# Patient Record
Sex: Female | Born: 1975
Health system: Southern US, Community
[De-identification: ages and names within clinical notes are randomized; demographics above are authoritative.]

## PROBLEM LIST (undated history)

## (undated) DIAGNOSIS — F1910 Other psychoactive substance abuse, uncomplicated: Secondary | ICD-10-CM

## (undated) DIAGNOSIS — E119 Type 2 diabetes mellitus without complications: Secondary | ICD-10-CM

## (undated) HISTORY — PX: TUBAL LIGATION: SHX77

---

## 1997-11-01 ENCOUNTER — Emergency Department (HOSPITAL_COMMUNITY): Admission: EM | Admit: 1997-11-01 | Discharge: 1997-11-01 | Payer: Self-pay | Admitting: Emergency Medicine

## 1997-12-16 ENCOUNTER — Emergency Department (HOSPITAL_COMMUNITY): Admission: EM | Admit: 1997-12-16 | Discharge: 1997-12-16 | Payer: Self-pay | Admitting: Emergency Medicine

## 1998-05-30 ENCOUNTER — Ambulatory Visit (HOSPITAL_COMMUNITY): Admission: RE | Admit: 1998-05-30 | Discharge: 1998-05-30 | Payer: Self-pay | Admitting: *Deleted

## 1998-07-04 ENCOUNTER — Inpatient Hospital Stay (HOSPITAL_COMMUNITY): Admission: AD | Admit: 1998-07-04 | Discharge: 1998-07-08 | Payer: Self-pay | Admitting: *Deleted

## 1998-08-08 ENCOUNTER — Encounter: Admission: RE | Admit: 1998-08-08 | Discharge: 1998-08-08 | Payer: Self-pay | Admitting: Obstetrics

## 1998-08-08 ENCOUNTER — Other Ambulatory Visit: Admission: RE | Admit: 1998-08-08 | Discharge: 1998-08-08 | Payer: Self-pay | Admitting: Obstetrics

## 1999-06-25 ENCOUNTER — Inpatient Hospital Stay (HOSPITAL_COMMUNITY): Admission: AD | Admit: 1999-06-25 | Discharge: 1999-06-25 | Payer: Self-pay | Admitting: *Deleted

## 1999-08-14 ENCOUNTER — Ambulatory Visit (HOSPITAL_COMMUNITY): Admission: RE | Admit: 1999-08-14 | Discharge: 1999-08-14 | Payer: Self-pay | Admitting: *Deleted

## 1999-09-04 ENCOUNTER — Encounter: Admission: RE | Admit: 1999-09-04 | Discharge: 1999-09-04 | Payer: Self-pay | Admitting: Obstetrics

## 1999-09-08 ENCOUNTER — Ambulatory Visit (HOSPITAL_COMMUNITY): Admission: RE | Admit: 1999-09-08 | Discharge: 1999-09-08 | Payer: Self-pay | Admitting: Obstetrics

## 1999-10-16 ENCOUNTER — Encounter: Admission: RE | Admit: 1999-10-16 | Discharge: 1999-10-16 | Payer: Self-pay | Admitting: Obstetrics

## 1999-10-16 ENCOUNTER — Inpatient Hospital Stay (HOSPITAL_COMMUNITY): Admission: AD | Admit: 1999-10-16 | Discharge: 1999-10-16 | Payer: Self-pay | Admitting: Obstetrics & Gynecology

## 1999-10-16 ENCOUNTER — Encounter (HOSPITAL_COMMUNITY): Admission: RE | Admit: 1999-10-16 | Discharge: 1999-11-03 | Payer: Self-pay | Admitting: Obstetrics

## 1999-10-16 ENCOUNTER — Encounter: Payer: Self-pay | Admitting: Obstetrics & Gynecology

## 1999-10-17 ENCOUNTER — Encounter: Admission: RE | Admit: 1999-10-17 | Discharge: 2000-01-15 | Payer: Self-pay | Admitting: Obstetrics & Gynecology

## 1999-10-30 ENCOUNTER — Inpatient Hospital Stay (HOSPITAL_COMMUNITY): Admission: AD | Admit: 1999-10-30 | Discharge: 1999-11-03 | Payer: Self-pay | Admitting: Obstetrics & Gynecology

## 1999-10-30 ENCOUNTER — Encounter (INDEPENDENT_AMBULATORY_CARE_PROVIDER_SITE_OTHER): Payer: Self-pay | Admitting: Specialist

## 1999-10-30 ENCOUNTER — Encounter: Payer: Self-pay | Admitting: Obstetrics & Gynecology

## 1999-10-30 ENCOUNTER — Encounter: Admission: RE | Admit: 1999-10-30 | Discharge: 1999-10-30 | Payer: Self-pay | Admitting: Obstetrics

## 2002-09-26 ENCOUNTER — Emergency Department (HOSPITAL_COMMUNITY): Admission: EM | Admit: 2002-09-26 | Discharge: 2002-09-26 | Payer: Self-pay | Admitting: Emergency Medicine

## 2003-05-17 ENCOUNTER — Emergency Department (HOSPITAL_COMMUNITY): Admission: EM | Admit: 2003-05-17 | Discharge: 2003-05-17 | Payer: Self-pay | Admitting: Emergency Medicine

## 2004-02-29 ENCOUNTER — Emergency Department (HOSPITAL_COMMUNITY): Admission: EM | Admit: 2004-02-29 | Discharge: 2004-02-29 | Payer: Self-pay | Admitting: Emergency Medicine

## 2004-10-16 ENCOUNTER — Emergency Department (HOSPITAL_COMMUNITY): Admission: EM | Admit: 2004-10-16 | Discharge: 2004-10-16 | Payer: Self-pay | Admitting: Emergency Medicine

## 2005-11-22 ENCOUNTER — Emergency Department (HOSPITAL_COMMUNITY): Admission: EM | Admit: 2005-11-22 | Discharge: 2005-11-22 | Payer: Self-pay | Admitting: Emergency Medicine

## 2006-06-07 ENCOUNTER — Emergency Department (HOSPITAL_COMMUNITY): Admission: EM | Admit: 2006-06-07 | Discharge: 2006-06-07 | Payer: Self-pay | Admitting: Emergency Medicine

## 2006-11-11 ENCOUNTER — Emergency Department (HOSPITAL_COMMUNITY): Admission: EM | Admit: 2006-11-11 | Discharge: 2006-11-11 | Payer: Self-pay | Admitting: Family Medicine

## 2007-04-12 ENCOUNTER — Emergency Department (HOSPITAL_COMMUNITY): Admission: EM | Admit: 2007-04-12 | Discharge: 2007-04-12 | Payer: Self-pay | Admitting: Emergency Medicine

## 2007-05-09 ENCOUNTER — Emergency Department (HOSPITAL_COMMUNITY): Admission: EM | Admit: 2007-05-09 | Discharge: 2007-05-09 | Payer: Self-pay | Admitting: Family Medicine

## 2008-07-09 ENCOUNTER — Emergency Department (HOSPITAL_COMMUNITY): Admission: EM | Admit: 2008-07-09 | Discharge: 2008-07-09 | Payer: Self-pay | Admitting: Family Medicine

## 2008-10-03 ENCOUNTER — Emergency Department (HOSPITAL_COMMUNITY): Admission: EM | Admit: 2008-10-03 | Discharge: 2008-10-03 | Payer: Self-pay | Admitting: Family Medicine

## 2008-12-04 ENCOUNTER — Ambulatory Visit: Payer: Self-pay

## 2008-12-04 ENCOUNTER — Encounter (INDEPENDENT_AMBULATORY_CARE_PROVIDER_SITE_OTHER): Payer: Self-pay | Admitting: *Deleted

## 2008-12-04 DIAGNOSIS — L259 Unspecified contact dermatitis, unspecified cause: Secondary | ICD-10-CM

## 2009-02-17 ENCOUNTER — Emergency Department (HOSPITAL_COMMUNITY): Admission: EM | Admit: 2009-02-17 | Discharge: 2009-02-17 | Payer: Self-pay | Admitting: Emergency Medicine

## 2009-03-17 ENCOUNTER — Emergency Department (HOSPITAL_COMMUNITY): Admission: EM | Admit: 2009-03-17 | Discharge: 2009-03-17 | Payer: Self-pay | Admitting: Emergency Medicine

## 2009-07-14 ENCOUNTER — Emergency Department (HOSPITAL_COMMUNITY): Admission: EM | Admit: 2009-07-14 | Discharge: 2009-07-14 | Payer: Self-pay | Admitting: Emergency Medicine

## 2009-11-28 ENCOUNTER — Emergency Department (HOSPITAL_COMMUNITY): Admission: EM | Admit: 2009-11-28 | Discharge: 2009-11-28 | Payer: Self-pay | Admitting: Family Medicine

## 2009-12-13 ENCOUNTER — Emergency Department (HOSPITAL_COMMUNITY): Admission: EM | Admit: 2009-12-13 | Discharge: 2009-12-13 | Payer: Self-pay | Admitting: Emergency Medicine

## 2010-07-30 ENCOUNTER — Emergency Department (HOSPITAL_COMMUNITY)
Admission: EM | Admit: 2010-07-30 | Discharge: 2010-07-30 | Disposition: A | Discharge status: Home / Self Care | Payer: Self-pay | Attending: Emergency Medicine | Admitting: Emergency Medicine

## 2010-07-30 DIAGNOSIS — IMO0002 Reserved for concepts with insufficient information to code with codable children: Secondary | ICD-10-CM | POA: Insufficient documentation

## 2010-07-30 DIAGNOSIS — Y92009 Unspecified place in unspecified non-institutional (private) residence as the place of occurrence of the external cause: Secondary | ICD-10-CM | POA: Insufficient documentation

## 2010-07-30 DIAGNOSIS — M25579 Pain in unspecified ankle and joints of unspecified foot: Secondary | ICD-10-CM | POA: Insufficient documentation

## 2010-07-30 DIAGNOSIS — M79609 Pain in unspecified limb: Secondary | ICD-10-CM | POA: Insufficient documentation

## 2010-08-08 ENCOUNTER — Emergency Department (HOSPITAL_COMMUNITY)
Admission: EM | Admit: 2010-08-08 | Discharge: 2010-08-08 | Disposition: A | Discharge status: Home / Self Care | Payer: Self-pay | Attending: Emergency Medicine | Admitting: Emergency Medicine

## 2010-08-08 DIAGNOSIS — R509 Fever, unspecified: Secondary | ICD-10-CM | POA: Insufficient documentation

## 2010-08-08 DIAGNOSIS — R05 Cough: Secondary | ICD-10-CM | POA: Insufficient documentation

## 2010-08-08 DIAGNOSIS — J069 Acute upper respiratory infection, unspecified: Secondary | ICD-10-CM | POA: Insufficient documentation

## 2010-08-08 DIAGNOSIS — R062 Wheezing: Secondary | ICD-10-CM | POA: Insufficient documentation

## 2010-08-08 DIAGNOSIS — R0609 Other forms of dyspnea: Secondary | ICD-10-CM | POA: Insufficient documentation

## 2010-08-08 DIAGNOSIS — R059 Cough, unspecified: Secondary | ICD-10-CM | POA: Insufficient documentation

## 2010-08-08 DIAGNOSIS — J40 Bronchitis, not specified as acute or chronic: Secondary | ICD-10-CM | POA: Insufficient documentation

## 2010-08-08 DIAGNOSIS — R0989 Other specified symptoms and signs involving the circulatory and respiratory systems: Secondary | ICD-10-CM | POA: Insufficient documentation

## 2010-08-08 DIAGNOSIS — R1110 Vomiting, unspecified: Secondary | ICD-10-CM | POA: Insufficient documentation

## 2010-08-08 DIAGNOSIS — J3489 Other specified disorders of nose and nasal sinuses: Secondary | ICD-10-CM | POA: Insufficient documentation

## 2010-08-08 DIAGNOSIS — R0982 Postnasal drip: Secondary | ICD-10-CM | POA: Insufficient documentation

## 2010-11-14 NOTE — Op Note (Signed)
[facility]  Patient:    Bianca Yang, Bianca Yang                    MRN: [id number] Proc. Date: 10/31/99 Adm. Date:  [id number] Disc. Date: [id number] Attending:  Antionette Char                           Operative Report  PREOPERATIVE DIAGNOSIS:       Multiparity.  POSTOPERATIVE DIAGNOSIS:      Multiparity.  OPERATION:                    Modified Pomeroy bilateral tubal ligation.  SURGEON:                      Roseanna Rainbow, M.D.  ANESTHESIA:                   Epidural.  ESTIMATED BLOOD LOSS:         Less than 50 cc.  COMPLICATIONS:                None.  DESCRIPTION OF PROCEDURE:     The patient was taken to the operating room. the epidural was felt to be adequate.  She was prepped and draped in the dorsal supine position.  A small infraumbilical skin incision was then made with the scalpel and carried down to the fascia.  The fascial incision was then extended with Mayo scissors.  The parietal peritoneum was then tented up and entered sharply. This incision was then extended sharply.  The parietal peritoneum was palpated and felt to be free of any adhesions.  The left fallopian tube was then grasped with a Babcock clamp and the fimbriated portion of the tube was visualized.  Two ligatures of 0 plain were then placed around a 2-3 cm segment of tube.  This segment of tube was then excised. Excellent hemostasis was noted, and the tube returned to the abdomen.  The right fallopian tube was then manipulated in a similar fashion.  The parietal peritoneum and fascia were closed in the above fashion using a running suture of 0 Vicryl.  The skin was reapproximated in a subcuticular fashion with 3-0 Vicryl.  At the close of the procedure, the instrument and pack counts were said to be correct x 2.  The patient was taken to the PACU in stable condition. DD:  02/09/00 TD:  02/09/00 Job: [id number]

## 2010-11-14 NOTE — Discharge Summary (Signed)
[facility]  Patient:    Bianca Yang, Bianca Yang                    MRN: [id number] Adm. Date:  [id number] Disc. Date: [id number] Attending:  Antionette Char Dictator:   Andrey Spearman, M.D.                           Discharge Summary  DISCHARGE DIAGNOSES:          1. Status post normal spontaneous vaginal delivery.                               2. After induction of labor.                               3. Gestational diabetes.                               4. Nonreactive fetal stress test.  DISCHARGE MEDICATIONS:        1. Ibuprofen 600 mg one p.o. q.6h. p.r.n. pain.  HISTORY OF PRESENT ILLNESS:   The patient is a 35 year old, gravida 5, now para  4-0-1-4, admitted at 37-6/7 weeks after being referred from [facility] secondary to a nonreactive stress test.  The patient reported she had een having contractions since early in the morning.  PHYSICAL EXAMINATION:         Significant for a temperature of 99.1.  She was contracting every six minutes.  HOSPITAL COURSE:  #1 - Status post normal spontaneous vaginal delivery after induction of labor. The patient was admitted and started on insulin per protocol. The patient was started on low dose Pitocin per protocol.  The patient then had artificial rupture of membranes with IUPC and fetal scalp electrode placed on the morning of Oct 31, 1999.  The patient had a reactive strip throughout the rest of her labor.  The patient was given penicillin when in active labor for positive Group B Strep.  In addition, the patient had increased blood pressures on Oct 31, 1999, and so magnesium was started.  The patient then underwent normal spontaneous vaginal delivery of a viable female infant with Apgars 8 and 9.  There were no complications with delivery and no lacerations were found or repaired.  Postpartum, the patient underwent bilateral tubal ligation and then was admitted to the AICU for  continued magnesium therapy.  The patient denied any neurological symptoms throughout her stay in AICU and the patients labs continued to be benign with a uric acid of 4.5 and LDH of  216, AST of 15, and ALT 11, and platelets of 135.  The patient on the morning of Nov 01, 1999, had nontherapeutic levels of magnesium, so her dose was increased. In addition, the patient had decreased saturations and so the patient was given Lasix x 1.  The patient was continued on insulin per protocol.  On postpartum day #2, the patient had more than adequate diuresis with negative 6 liter diuresis and so her magnesium was discontinued.  The patient continued to have some increased CBGs,  however, the patient was placed on diabetic diet and covered with insulin per protocol.  Just prior to discharge, the patients fasting CBG was 82.  She was  continued to have good saturations and her lungs were clear to auscultation. The patients blood pressure was 110/80 and she continued to deny any neurologic symptoms.  The patients CBGs prior to discharge had been reassuring x 48 hours nd her preeclampsia seemed to have resolved with good diuresis.  The patient is therefore deemed ready to be discharged.  #2 - Gestational diabetes.  As stated above, the patient had reassuring CBGs in the 48 hours prior to discharge.  Discussed with the patient the necessity to continue checking her blood sugars at home q.a.c. and q.h.s. and to keep a log.  The patient will also continue a diabetic diet.  The patient will follow up with myself on ay 23, at 3:45 at which point we will see how her sugars have been running.  The patient may need the addition of an oral hypoglycemic agent in the future.  The  patient was discharged home in stable condition and will be following up with Andrey Spearman, M.D. on May 23 at 3:45. DD:  11/17/99 TD:  11/18/99 Job: [id number]

## 2010-12-12 ENCOUNTER — Emergency Department: Payer: Self-pay | Admitting: Emergency Medicine

## 2011-02-10 ENCOUNTER — Emergency Department (HOSPITAL_COMMUNITY)
Admission: EM | Admit: 2011-02-10 | Discharge: 2011-02-10 | Disposition: A | Discharge status: Home / Self Care | Payer: Self-pay | Attending: Emergency Medicine | Admitting: Emergency Medicine

## 2011-02-10 ENCOUNTER — Emergency Department (HOSPITAL_COMMUNITY): Payer: Self-pay

## 2011-02-10 DIAGNOSIS — F17200 Nicotine dependence, unspecified, uncomplicated: Secondary | ICD-10-CM | POA: Insufficient documentation

## 2011-02-10 DIAGNOSIS — R059 Cough, unspecified: Secondary | ICD-10-CM | POA: Insufficient documentation

## 2011-02-10 DIAGNOSIS — R509 Fever, unspecified: Secondary | ICD-10-CM | POA: Insufficient documentation

## 2011-02-10 DIAGNOSIS — J40 Bronchitis, not specified as acute or chronic: Secondary | ICD-10-CM | POA: Insufficient documentation

## 2011-02-10 DIAGNOSIS — R070 Pain in throat: Secondary | ICD-10-CM | POA: Insufficient documentation

## 2011-02-10 DIAGNOSIS — R05 Cough: Secondary | ICD-10-CM | POA: Insufficient documentation

## 2011-02-10 DIAGNOSIS — J069 Acute upper respiratory infection, unspecified: Secondary | ICD-10-CM | POA: Insufficient documentation

## 2011-02-10 DIAGNOSIS — R079 Chest pain, unspecified: Secondary | ICD-10-CM | POA: Insufficient documentation

## 2011-02-10 DIAGNOSIS — J3489 Other specified disorders of nose and nasal sinuses: Secondary | ICD-10-CM | POA: Insufficient documentation

## 2011-02-10 LAB — DIFFERENTIAL
Basophils Relative: 0 % (ref 0–1)
Eosinophils Absolute: 0.2 10*3/uL (ref 0.0–0.7)
Monocytes Relative: 9 % (ref 3–12)
Neutrophils Relative %: 51 % (ref 43–77)

## 2011-02-10 LAB — BASIC METABOLIC PANEL
Calcium: 9.7 mg/dL (ref 8.4–10.5)
Chloride: 98 mEq/L (ref 96–112)
Creatinine, Ser: <0.47 mg/dL — ABNORMAL LOW (ref 0.50–1.10)

## 2011-02-10 LAB — URINALYSIS, ROUTINE W REFLEX MICROSCOPIC
Glucose, UA: 100 mg/dL — AB
Protein, ur: 100 mg/dL — AB
Urobilinogen, UA: 1 mg/dL (ref 0.0–1.0)

## 2011-02-10 LAB — CBC
Hemoglobin: 14.1 g/dL (ref 12.0–15.0)
MCHC: 32.9 g/dL (ref 30.0–36.0)
Platelets: 171 10*3/uL (ref 150–400)
RDW: 13.1 % (ref 11.5–15.5)

## 2011-02-10 LAB — URINE MICROSCOPIC-ADD ON

## 2011-02-10 LAB — MONONUCLEOSIS SCREEN: Mono Screen: NEGATIVE

## 2011-02-15 ENCOUNTER — Emergency Department (HOSPITAL_COMMUNITY): Payer: Self-pay

## 2011-02-15 ENCOUNTER — Emergency Department (HOSPITAL_COMMUNITY)
Admission: EM | Admit: 2011-02-15 | Discharge: 2011-02-15 | Disposition: A | Discharge status: Home / Self Care | Payer: Self-pay | Attending: Emergency Medicine | Admitting: Emergency Medicine

## 2011-02-15 DIAGNOSIS — R509 Fever, unspecified: Secondary | ICD-10-CM | POA: Insufficient documentation

## 2011-02-15 DIAGNOSIS — R110 Nausea: Secondary | ICD-10-CM | POA: Insufficient documentation

## 2011-02-15 DIAGNOSIS — R059 Cough, unspecified: Secondary | ICD-10-CM | POA: Insufficient documentation

## 2011-02-15 DIAGNOSIS — R079 Chest pain, unspecified: Secondary | ICD-10-CM | POA: Insufficient documentation

## 2011-02-15 DIAGNOSIS — M549 Dorsalgia, unspecified: Secondary | ICD-10-CM | POA: Insufficient documentation

## 2011-02-15 DIAGNOSIS — J40 Bronchitis, not specified as acute or chronic: Secondary | ICD-10-CM | POA: Insufficient documentation

## 2011-02-15 DIAGNOSIS — R05 Cough: Secondary | ICD-10-CM | POA: Insufficient documentation

## 2011-02-15 DIAGNOSIS — R51 Headache: Secondary | ICD-10-CM | POA: Insufficient documentation

## 2011-02-15 DIAGNOSIS — F17200 Nicotine dependence, unspecified, uncomplicated: Secondary | ICD-10-CM | POA: Insufficient documentation

## 2011-02-15 DIAGNOSIS — F411 Generalized anxiety disorder: Secondary | ICD-10-CM | POA: Insufficient documentation

## 2011-12-14 ENCOUNTER — Emergency Department (HOSPITAL_COMMUNITY)
Admission: EM | Admit: 2011-12-14 | Discharge: 2011-12-14 | Disposition: A | Discharge status: Home / Self Care | Payer: Self-pay | Attending: Emergency Medicine | Admitting: Emergency Medicine

## 2011-12-14 ENCOUNTER — Encounter (HOSPITAL_COMMUNITY): Payer: Self-pay | Admitting: *Deleted

## 2011-12-14 DIAGNOSIS — M79671 Pain in right foot: Secondary | ICD-10-CM

## 2011-12-14 DIAGNOSIS — F17200 Nicotine dependence, unspecified, uncomplicated: Secondary | ICD-10-CM | POA: Insufficient documentation

## 2011-12-14 DIAGNOSIS — X500XXA Overexertion from strenuous movement or load, initial encounter: Secondary | ICD-10-CM | POA: Insufficient documentation

## 2011-12-14 DIAGNOSIS — M79609 Pain in unspecified limb: Secondary | ICD-10-CM | POA: Insufficient documentation

## 2011-12-14 MED ORDER — HYDROCODONE-ACETAMINOPHEN 5-325 MG PO TABS
2.0000 | ORAL_TABLET | Freq: Once | ORAL | Status: AC
  Administered 2011-12-14: 2 via ORAL
  Filled 2011-12-14: qty 2

## 2011-12-14 MED ORDER — HYDROCODONE-ACETAMINOPHEN 5-325 MG PO TABS: 2.0000 | ORAL_TABLET | ORAL | Status: AC | PRN

## 2011-12-14 NOTE — ED Notes (Signed)
Pt reports right foot began to hurt while at work on Saturday. Pain to outer side of foot. Worse with weight bearing.

## 2011-12-14 NOTE — Progress Notes (Signed)
Orthopedic Tech Progress Note Patient Details:  Bianca Yang 04/15/1976 [id number]  Ortho Devices Type of Ortho Device: Postop boot Ortho Device/Splint Location: right foot Ortho Device/Splint Interventions: Application   Marcello Tuzzolino T 12/14/2011, 9:05 AM

## 2011-12-14 NOTE — ED Notes (Signed)
Pt presents to department for evaluation of R foot pain. States pain began Saturday while at work. 8/10 at the time. No obvious deformity noted. Pedal pulses present. Able to wiggle digits. Capillary refill less than 2 seconds. Ambulatory without difficulty. She is alert and oriented x4. No signs of distress at the time.

## 2011-12-14 NOTE — Discharge Instructions (Signed)
Wear the special shoe as needed for discomfort, take the medication prescribed for bad pain or Tylenol or ibuprofen for mild pain. Get an orthotic from your local pharmacy to put in your shoe  to help with your discomfort.call Dr. Leary Roca to schedule an appointment if not better in a few days

## 2011-12-14 NOTE — ED Provider Notes (Cosign Needed)
History     CSN: [id number]  Arrival date & time 12/14/11  0759   First MD Initiated Contact with Patient 12/14/11 [phone]      Chief Complaint  Patient presents with  . Foot Pain    (Consider location/radiation/quality/duration/timing/severity/associated sxs/prior treatment) HPI Complains of right foot pain lateral aspect, gradual in onset 2 days ago while walking. No injury. Treated with ibuprofen and Tylenol, without relief. Pain is worse with weightbearing. Improved with rest. Nonradiating at right lateral foot History reviewed. No pertinent past medical history. Past medical history negative Past Surgical History  Procedure Date  . Tubal ligation     No family history on file.  History  Substance Use Topics  . Smoking status: Current Everyday Smoker  . Smokeless tobacco: Not on file  . Alcohol Use: No   occasional alcohol, no illicit drugs  OB History    Grav Para Term Preterm Abortions TAB SAB Ect Mult Living                  Review of Systems  Constitutional: Negative.   HENT: Negative.   Respiratory: Negative.   Cardiovascular: Negative.   Gastrointestinal: Negative.   Musculoskeletal:       Right foot pain otherwise negative  Skin: Negative.   Neurological: Negative.   Hematological: Negative.   Psychiatric/Behavioral: Negative.     Allergies  Codeine and Sulfa antibiotics  Home Medications   Current Outpatient Rx  Name Route Sig Dispense Refill  . IBUPROFEN 200 MG PO TABS Oral Take 200 mg by mouth every 6 (six) hours as needed. For pain.      BP 120/69  Pulse 56  Temp 98.2 F (36.8 C) (Oral)  Resp 16  SpO2 96%  LMP 11/16/2011  Physical Exam  Vitals reviewed. Constitutional: She is oriented to person, place, and time. She appears well-developed and well-nourished.  HENT:  Head: Normocephalic and atraumatic.  Eyes: EOM are normal.  Neck: Neck supple.  Pulmonary/Chest: Effort normal. No respiratory distress.  Abdominal:       OBese    Musculoskeletal:       Right lower extremity foot is without swelling redness or warmth. She is tender over lateral aspect of proximal foot. DP pulse and PT pulses 2+ good capillary refill   Neurological: She is alert and oriented to person, place, and time.    ED Course  Procedures (including critical care time)  Labs Reviewed - No data to display No results found.   No diagnosis found.    MDM  No sign of soft tissue infection, x-rays not indicated, no injury Plan postop shoe Prescription Norco Referral [facility] Diagnosis right foot pain        Doug Sou, MD 12/14/11 [phone]

## 2012-01-26 ENCOUNTER — Emergency Department (HOSPITAL_COMMUNITY)
Admission: EM | Admit: 2012-01-26 | Discharge: 2012-01-26 | Disposition: A | Discharge status: Home / Self Care | Payer: Self-pay | Attending: Emergency Medicine | Admitting: Emergency Medicine

## 2012-01-26 ENCOUNTER — Encounter (HOSPITAL_COMMUNITY): Payer: Self-pay | Admitting: *Deleted

## 2012-01-26 DIAGNOSIS — R52 Pain, unspecified: Secondary | ICD-10-CM | POA: Insufficient documentation

## 2012-01-26 DIAGNOSIS — M549 Dorsalgia, unspecified: Secondary | ICD-10-CM | POA: Insufficient documentation

## 2012-01-26 MED ORDER — DIAZEPAM 5 MG PO TABS
5.0000 mg | ORAL_TABLET | Freq: Once | ORAL | Status: AC
  Administered 2012-01-26: 5 mg via ORAL
  Filled 2012-01-26: qty 1

## 2012-01-26 MED ORDER — KETOROLAC TROMETHAMINE 15 MG/ML IJ SOLN
15.0000 mg | Freq: Once | INTRAMUSCULAR | Status: AC
  Filled 2012-01-26: qty 1

## 2012-01-26 MED ORDER — KETOROLAC TROMETHAMINE 30 MG/ML IJ SOLN
INTRAMUSCULAR | Status: AC
  Administered 2012-01-26: 15 mg via INTRAMUSCULAR
  Filled 2012-01-26: qty 1

## 2012-01-26 MED ORDER — CYCLOBENZAPRINE HCL 10 MG PO TABS: 10.0000 mg | ORAL_TABLET | Freq: Three times a day (TID) | ORAL | Status: AC | PRN

## 2012-01-26 MED ORDER — OXYCODONE-ACETAMINOPHEN 5-325 MG PO TABS: 1.0000 | ORAL_TABLET | ORAL | Status: AC | PRN

## 2012-01-26 MED ORDER — HYDROMORPHONE HCL PF 1 MG/ML IJ SOLN
1.0000 mg | Freq: Once | INTRAMUSCULAR | Status: AC
  Administered 2012-01-26: 1 mg via INTRAMUSCULAR
  Filled 2012-01-26: qty 1

## 2012-01-26 MED ORDER — NAPROXEN 500 MG PO TABS: 500.0000 mg | ORAL_TABLET | Freq: Two times a day (BID) | ORAL | Status: DC | PRN

## 2012-01-26 NOTE — ED Provider Notes (Signed)
History    This chart was scribed for Raeford Razor, MD, MD by Smitty Pluck. The patient was seen in room TR08C and the patient's care was started at 12:29PM.   CSN: [id number]  Arrival date & time 01/26/12  1159   First MD Initiated Contact with Patient 01/26/12 1206      Chief Complaint  Patient presents with  . Back Pain    (Consider location/radiation/quality/duration/timing/severity/associated sxs/prior treatment) The history is provided by the patient.   Bianca Yang is a 36 y.o. female who presents to the Emergency Department complaining of constant, moderate, middle lower back pain onset 1 week ago. Pt reports that pain is aggravated by movement. Denies radiation, numbness and tingling. Denies incontinence, vaginal discharge, dysuria and IV drug use. Denies back surgery. Pt reports that she has taken ibuprofen without relief.  Pt reports that she occasionally smokes.   History reviewed. No pertinent past medical history.  Past Surgical History  Procedure Date  . Tubal ligation     No family history on file.  History  Substance Use Topics  . Smoking status: Current Everyday Smoker  . Smokeless tobacco: Not on file  . Alcohol Use: No    OB History    Grav Para Term Preterm Abortions TAB SAB Ect Mult Living                  Review of Systems  All other systems reviewed and are negative.  10 Systems reviewed and all are negative for acute change except as noted in the HPI.    Allergies  Codeine and Sulfa antibiotics  Home Medications   Current Outpatient Rx  Name Route Sig Dispense Refill  . IBUPROFEN 200 MG PO TABS Oral Take 200 mg by mouth every 6 (six) hours as needed. For pain.      BP 128/73  Pulse 73  Temp 98.3 F (36.8 C) (Oral)  Resp 18  SpO2 99%  Physical Exam  Nursing note and vitals reviewed. Constitutional: She appears well-developed and well-nourished. No distress.  HENT:  Head: Normocephalic and atraumatic.  Eyes:  Conjunctivae are normal. Right eye exhibits no discharge. Left eye exhibits no discharge.  Neck: Neck supple.  Cardiovascular: Normal rate, regular rhythm and normal heart sounds.  Exam reveals no gallop and no friction rub.   No murmur heard. Pulmonary/Chest: Effort normal. No respiratory distress.       Coarse breath sounds  Abdominal: Soft. She exhibits no distension. There is no tenderness.  Musculoskeletal:       Paraspinal tenderness in lumbar region No overlying skin changes Sensation intact Strength equal bilaterally   Neurological: She is alert.  Skin: Skin is warm and dry.  Psychiatric: She has a normal mood and affect. Her behavior is normal. Thought content normal.    ED Course  Procedures (including critical care time) DIAGNOSTIC STUDIES: Oxygen Saturation is 99% on room air, normal by my interpretation.    COORDINATION OF CARE: 12:30PM EDP discusses pt ED treatment with pt.  12:45PM EDP ordered medication: Scheduled Meds:   . diazepam  5 mg Oral Once  .  HYDROmorphone (DILAUDID) injection  1 mg Intramuscular Once  . ketorolac  15 mg Intramuscular Once   Continuous Infusions:  PRN Meds:.    Labs Reviewed - No data to display No results found.   1. Acute back pain       MDM  35yF with back pain. Atraumatic. No evidence of cord impingement or other "red  flags." Plan symptomatic tx. Return precautions discussed.      I personally preformed the services scribed in my presence. The recorded information has been reviewed and considered. Raeford Razor, MD.    Raeford Razor, MD 02/04/12 [phone]

## 2012-01-26 NOTE — ED Notes (Signed)
The pt sts she has been taking ibuprofen and it hasn't been helping at all. Is requesting something for pain. Advised MD would be in to evaluate her very soon.

## 2012-01-26 NOTE — ED Notes (Signed)
PT is here with lower back pain under a week and denies injury.  No urinary or vaginal s/s.  LMP- finished 4 days ago.

## 2012-07-13 ENCOUNTER — Emergency Department (HOSPITAL_COMMUNITY)
Admission: EM | Admit: 2012-07-13 | Discharge: 2012-07-13 | Disposition: A | Discharge status: Home / Self Care | Payer: Self-pay | Attending: Emergency Medicine | Admitting: Emergency Medicine

## 2012-07-13 ENCOUNTER — Encounter (HOSPITAL_COMMUNITY): Payer: Self-pay | Admitting: *Deleted

## 2012-07-13 ENCOUNTER — Emergency Department (HOSPITAL_COMMUNITY): Payer: Self-pay

## 2012-07-13 DIAGNOSIS — R6883 Chills (without fever): Secondary | ICD-10-CM | POA: Insufficient documentation

## 2012-07-13 DIAGNOSIS — R7309 Other abnormal glucose: Secondary | ICD-10-CM | POA: Insufficient documentation

## 2012-07-13 DIAGNOSIS — R739 Hyperglycemia, unspecified: Secondary | ICD-10-CM

## 2012-07-13 DIAGNOSIS — J029 Acute pharyngitis, unspecified: Secondary | ICD-10-CM | POA: Insufficient documentation

## 2012-07-13 DIAGNOSIS — R630 Anorexia: Secondary | ICD-10-CM | POA: Insufficient documentation

## 2012-07-13 DIAGNOSIS — R197 Diarrhea, unspecified: Secondary | ICD-10-CM | POA: Insufficient documentation

## 2012-07-13 DIAGNOSIS — J3489 Other specified disorders of nose and nasal sinuses: Secondary | ICD-10-CM | POA: Insufficient documentation

## 2012-07-13 DIAGNOSIS — R51 Headache: Secondary | ICD-10-CM | POA: Insufficient documentation

## 2012-07-13 DIAGNOSIS — F17200 Nicotine dependence, unspecified, uncomplicated: Secondary | ICD-10-CM | POA: Insufficient documentation

## 2012-07-13 DIAGNOSIS — R5381 Other malaise: Secondary | ICD-10-CM | POA: Insufficient documentation

## 2012-07-13 DIAGNOSIS — J069 Acute upper respiratory infection, unspecified: Secondary | ICD-10-CM | POA: Insufficient documentation

## 2012-07-13 DIAGNOSIS — J111 Influenza due to unidentified influenza virus with other respiratory manifestations: Secondary | ICD-10-CM | POA: Insufficient documentation

## 2012-07-13 LAB — CBC WITH DIFFERENTIAL/PLATELET
Basophils Absolute: 0 10*3/uL (ref 0.0–0.1)
Eosinophils Absolute: 0.4 10*3/uL (ref 0.0–0.7)
Eosinophils Relative: 3 % (ref 0–5)
Lymphs Abs: 2.3 10*3/uL (ref 0.7–4.0)
MCH: 29.3 pg (ref 26.0–34.0)
MCHC: 32.6 g/dL (ref 30.0–36.0)
MCV: 89.8 fL (ref 78.0–100.0)
Neutrophils Relative %: 72 % (ref 43–77)
Platelets: 211 10*3/uL (ref 150–400)
RBC: 5.47 MIL/uL — ABNORMAL HIGH (ref 3.87–5.11)
RDW: 13.5 % (ref 11.5–15.5)

## 2012-07-13 LAB — BASIC METABOLIC PANEL
BUN: 6 mg/dL (ref 6–23)
CO2: 28 mEq/L (ref 19–32)
Calcium: 9.5 mg/dL (ref 8.4–10.5)
Creatinine, Ser: 0.33 mg/dL — ABNORMAL LOW (ref 0.50–1.10)
GFR calc non Af Amer: >90 mL/min (ref >90)
Glucose, Bld: 266 mg/dL — ABNORMAL HIGH (ref 70–99)

## 2012-07-13 MED ORDER — METFORMIN HCL 500 MG PO TABS: 500.0000 mg | ORAL_TABLET | Freq: Two times a day (BID) | ORAL | Status: DC

## 2012-07-13 MED ORDER — HYDROCODONE-HOMATROPINE 5-1.5 MG/5ML PO SYRP: 5.0000 mL | ORAL_SOLUTION | Freq: Four times a day (QID) | ORAL | Status: DC | PRN

## 2012-07-13 MED ORDER — HYDROCODONE-ACETAMINOPHEN 7.5-500 MG/15ML PO SOLN
10.0000 mL | Freq: Once | ORAL | Status: AC
  Administered 2012-07-13: 10 mL via ORAL
  Filled 2012-07-13: qty 15

## 2012-07-13 NOTE — ED Notes (Signed)
Pt c/o URI with cough and diarrhea; pt c/o generalized weakness and fever x 10 days

## 2012-07-13 NOTE — ED Notes (Signed)
Pt reports fever, productive cough, flu like symptoms, and generalized weakness x 7 days. States yellow-green sputum with cough and nausea.

## 2012-07-13 NOTE — ED Provider Notes (Signed)
History     CSN: [id number]  Arrival date & time 07/13/12  1512   First MD Initiated Contact with Patient 07/13/12 1631      Chief Complaint  Patient presents with  . URI  . Cough  . Diarrhea    (Consider location/radiation/quality/duration/timing/severity/associated sxs/prior treatment) HPI Comments: Pt presents to the ED with complaints of flu-like symptoms of cough, congestion, sore throat, muscle aches, chills, fever, headache, decreased appetite, fatigue, and diarrhea. The patient states that the symptoms started one week ago.  Pt has been around other sick contacts and did not get the flu shot this year. The patient denies neck pain/stiffness, weakness, vision changes, severe abdominal pain, inability to eat or drink, vomiting, difficulty breathing, SOB, wheezing, or chest pain. The patient has tried cough medicine, NSAIDS, and rest but has only felt mild relief.   She currently smokes.  She is otherwise healthy.     The history is provided by the patient.    History reviewed. No pertinent past medical history.  Past Surgical History  Procedure Date  . Tubal ligation     History reviewed. No pertinent family history.  History  Substance Use Topics  . Smoking status: Current Every Day Smoker  . Smokeless tobacco: Not on file  . Alcohol Use: No    OB History    Grav Para Term Preterm Abortions TAB SAB Ect Mult Living                  Review of Systems  Constitutional: Positive for chills, appetite change and fatigue.       Subjective fever  HENT: Positive for congestion, sore throat and rhinorrhea. Negative for drooling, trouble swallowing, neck pain, neck stiffness and voice change.   Eyes: Negative for visual disturbance.  Respiratory: Positive for cough. Negative for shortness of breath.   Cardiovascular: Negative for chest pain.  Gastrointestinal: Positive for diarrhea. Negative for nausea, vomiting and abdominal pain.  Neurological: Positive for  headaches. Negative for syncope, facial asymmetry, weakness and numbness.  Psychiatric/Behavioral: Negative for confusion.  All other systems reviewed and are negative.    Allergies  Codeine and Sulfa antibiotics  Home Medications   Current Outpatient Rx  Name  Route  Sig  Dispense  Refill  . IBUPROFEN 200 MG PO TABS   Oral   Take 600 mg by mouth every 6 (six) hours as needed. For pain.         Marland Kitchen NAPROXEN 500 MG PO TABS   Oral   Take 1 tablet (500 mg total) by mouth 2 (two) times daily as needed.   20 tablet   0     BP 131/110  Pulse 81  Temp 98.4 F (36.9 C) (Oral)  Resp 18  SpO2 96%  LMP 07/03/2012  Physical Exam  Nursing note and vitals reviewed. Constitutional: She appears well-developed and well-nourished. No distress.  HENT:  Head: Normocephalic and atraumatic.  Right Ear: Tympanic membrane and ear canal normal.  Left Ear: Tympanic membrane and ear canal normal.  Nose: Mucosal edema and rhinorrhea present. Right sinus exhibits no maxillary sinus tenderness and no frontal sinus tenderness. Left sinus exhibits no maxillary sinus tenderness and no frontal sinus tenderness.  Mouth/Throat: Uvula is midline, oropharynx is clear and moist and mucous membranes are normal. No oropharyngeal exudate, posterior oropharyngeal edema or tonsillar abscesses.  Neck: Normal range of motion. Neck supple.  Cardiovascular: Normal rate, regular rhythm and normal heart sounds.   Pulmonary/Chest: Effort normal and  breath sounds normal. No respiratory distress. She has no wheezes. She has no rales. She exhibits no tenderness.  Abdominal: Soft. Bowel sounds are normal. She exhibits no distension and no mass. There is no tenderness. There is no rebound and no guarding.  Musculoskeletal: Normal range of motion.  Neurological: She is alert.  Skin: Skin is warm and dry. No rash noted. She is not diaphoretic.  Psychiatric: She has a normal mood and affect.    ED Course  Procedures  (including critical care time)  Labs Reviewed  CBC WITH DIFFERENTIAL - Abnormal; Notable for the following:    WBC 11.8 (*)     RBC 5.47 (*)     Hemoglobin 16.0 (*)     HCT 49.1 (*)     Neutro Abs 8.5 (*)     All other components within normal limits  BASIC METABOLIC PANEL - Abnormal; Notable for the following:    Chloride 95 (*)     Glucose, Bld 266 (*)     Creatinine, Ser 0.33 (*)     All other components within normal limits   Dg Chest 2 View  07/13/2012  *RADIOLOGY REPORT*  Clinical Data: Cough with upper respiratory tract infection  CHEST - 2 VIEW  Comparison:  February 15, 2011  Findings: Lungs are clear.  Heart size and pulmonary vascularity are normal.  No adenopathy.  No bone lesions.  IMPRESSION: No abnormality noted.   Original Report Authenticated By: Bretta Bang, M.D.      No diagnosis found.    MDM  Patient with symptoms consistent with influenza.  Vitals are stable.  No signs of dehydration, tolerating PO's.  Lungs are clear to auscultation.  CXR negative.  Discussed the cost versus benefit of Tamiflu treatment with the patient.  The patient understands that symptoms are greater than the recommended 24-48 hour window of treatment.  Patient will be discharged with instructions to orally hydrate, rest, and use over-the-counter medications such as anti-inflammatories ibuprofen and Aleve for muscle aches and Tylenol for fever.  Patient will also be given a cough suppressant.          Pascal Lux [place], PA-C 07/15/12 1359

## 2012-07-16 NOTE — ED Provider Notes (Signed)
Medical screening examination/treatment/procedure(s) were performed by non-physician practitioner and as supervising physician I was immediately available for consultation/collaboration.   Rodolphe Edmonston L Kordelia Severin, MD 07/16/12 0804 

## 2012-11-02 ENCOUNTER — Emergency Department (INDEPENDENT_AMBULATORY_CARE_PROVIDER_SITE_OTHER)
Admission: EM | Admit: 2012-11-02 | Discharge: 2012-11-02 | Disposition: A | Discharge status: Home / Self Care | Payer: Self-pay | Source: Home / Self Care | Attending: Emergency Medicine | Admitting: Emergency Medicine

## 2012-11-02 ENCOUNTER — Encounter (HOSPITAL_COMMUNITY): Payer: Self-pay | Admitting: *Deleted

## 2012-11-02 DIAGNOSIS — S0181XA Laceration without foreign body of other part of head, initial encounter: Secondary | ICD-10-CM

## 2012-11-02 DIAGNOSIS — S0180XA Unspecified open wound of other part of head, initial encounter: Secondary | ICD-10-CM

## 2012-11-02 DIAGNOSIS — Z23 Encounter for immunization: Secondary | ICD-10-CM

## 2012-11-02 DIAGNOSIS — S0990XA Unspecified injury of head, initial encounter: Secondary | ICD-10-CM

## 2012-11-02 MED ORDER — IBUPROFEN 800 MG PO TABS
800.0000 mg | ORAL_TABLET | Freq: Once | ORAL | Status: AC
  Administered 2012-11-02: 800 mg via ORAL

## 2012-11-02 MED ORDER — TETANUS-DIPHTH-ACELL PERTUSSIS 5-2.5-18.5 LF-MCG/0.5 IM SUSP
INTRAMUSCULAR | Status: AC
  Filled 2012-11-02: qty 0.5

## 2012-11-02 MED ORDER — TETANUS-DIPHTH-ACELL PERTUSSIS 5-2.5-18.5 LF-MCG/0.5 IM SUSP
0.5000 mL | Freq: Once | INTRAMUSCULAR | Status: AC
  Administered 2012-11-02: 0.5 mL via INTRAMUSCULAR

## 2012-11-02 MED ORDER — IBUPROFEN 400 MG PO TABS: 400.0000 mg | ORAL_TABLET | Freq: Four times a day (QID) | ORAL | Status: DC | PRN

## 2012-11-02 MED ORDER — IBUPROFEN 800 MG PO TABS
ORAL_TABLET | ORAL | Status: AC
  Filled 2012-11-02: qty 1

## 2012-11-02 NOTE — ED Notes (Signed)
Pt  Felled  This  Am   And  Sustained  A  laceration  To  The  l  Side  Of  Head   No loss  Of  concoussness      No  Vomiting            She  Is  Awake  And  Alert  And  Oriented

## 2012-11-02 NOTE — ED Provider Notes (Signed)
History     CSN: [id number]  Arrival date & time 11/02/12  1054   First MD Initiated Contact with Patient 11/02/12 1144      Chief Complaint  Patient presents with  . Head Injury    (Consider location/radiation/quality/duration/timing/severity/associated sxs/prior treatment) HPI Comments: Patient fell, this a.m. hitting against a hard surface ( kitchen table), corner of her left for head. Patient denies any loss of consciousness, no nausea vomiting or visual changes. Describes the areas very sore at touch. Caregiver took initial wound care as she clean the wound with peroxide and some type of antiseptic sticks. She did apply to Band-Aids. Later on was determined that maybe she needed stitches or some other form of one repair.  Patient is a 37 y.o. female presenting with head injury. The history is provided by the patient and a caregiver.  Head Injury Location:  Frontal Mechanism of injury: direct blow and fall   Pain details:    Quality:  Dull and sharp   Radiates to:  Face   Severity:  Mild   Timing:  Constant Chronicity:  New Relieved by:  Nothing (care giver provided pain pill?) Worsened by:  Pressure Ineffective treatments:  None tried Associated symptoms: no blurred vision, no double vision, no focal weakness, no headaches, no loss of consciousness, no nausea, no neck pain, no numbness and no tinnitus   Risk factors: no alcohol use and no occupational exposure     History reviewed. No pertinent past medical history.  Past Surgical History  Procedure Laterality Date  . Tubal ligation      History reviewed. No pertinent family history.  History  Substance Use Topics  . Smoking status: Current Every Day Smoker  . Smokeless tobacco: Not on file  . Alcohol Use: No    OB History   Grav Para Term Preterm Abortions TAB SAB Ect Mult Living                  Review of Systems  Constitutional: Positive for activity change. Negative for fever, diaphoresis, appetite  change and fatigue.  HENT: Negative for neck pain and tinnitus.   Eyes: Negative for blurred vision and double vision.  Gastrointestinal: Negative for nausea.  Musculoskeletal: Negative for back pain.  Neurological: Negative for dizziness, focal weakness, loss of consciousness, weakness, numbness and headaches.  Hematological: Does not bruise/bleed easily.  Psychiatric/Behavioral: Positive for behavioral problems. Negative for confusion. The patient is nervous/anxious.     Allergies  Codeine and Sulfa antibiotics  Home Medications   Current Outpatient Rx  Name  Route  Sig  Dispense  Refill  . Acetaminophen (TYLENOL PO)   Oral   Take 2 tablets by mouth every 4 (four) hours as needed. For pain         . HYDROcodone-homatropine (HYCODAN) 5-1.5 MG/5ML syrup   Oral   Take 5 mLs by mouth every 6 (six) hours as needed for cough.   120 mL   0   . ibuprofen (ADVIL,MOTRIN) 200 MG tablet   Oral   Take 600 mg by mouth every 6 (six) hours as needed. For pain.         Marland Kitchen ibuprofen (ADVIL,MOTRIN) 400 MG tablet   Oral   Take 1 tablet (400 mg total) by mouth every 6 (six) hours as needed for pain.   15 tablet   0   . metFORMIN (GLUCOPHAGE) 500 MG tablet   Oral   Take 1 tablet (500 mg total) by mouth 2 (  two) times daily with a meal.   28 tablet   0   . OVER THE COUNTER MEDICATION   Oral   Take 15 mLs by mouth every 4 (four) hours as needed. For cough           BP 127/86  Pulse 77  Temp(Src) 99.4 F (37.4 C) (Oral)  Resp 16  SpO2 97%  LMP 10/21/2012  Physical Exam  Nursing note and vitals reviewed. Constitutional: She is oriented to person, place, and time. Vital signs are normal.  Non-toxic appearance. She does not have a sickly appearance. She does not appear ill. She appears distressed.  HENT:  Head: Not macrocephalic and not microcephalic. Head is with laceration. Head is without raccoon's eyes, without abrasion, without contusion, without right periorbital erythema  and without left periorbital erythema.    Neurological: She is alert and oriented to person, place, and time. She exhibits normal muscle tone. Coordination normal.  Psychiatric: Her speech is normal. Her mood appears anxious. Her affect is labile. Her affect is not angry and not blunt. She is agitated. She is not withdrawn and not actively hallucinating. She does not exhibit a depressed mood. She is attentive.    ED Course  LACERATION REPAIR Date/Time: 11/02/2012 12:17 PM Performed by: Kashden Deboy Authorized by: Jimmie Molly Consent: Verbal consent obtained. Consent given by: patient and guardian Patient understanding: patient states understanding of the procedure being performed Body area: head/neck Location details: forehead Laceration length: 3 cm Tendon involvement: none Nerve involvement: none Anesthesia method: refussed local anesthesia. Preparation: iodine sticks. Amount of cleaning: standard Debridement: none Degree of undermining: none Skin closure: glue and Steri-Strips Approximation: close Approximation difficulty: simple   (including critical care time)  Labs Reviewed - No data to display No results found.   1. Laceration of face, initial encounter   2. Head injury, initial encounter       MDM  Uncomplicated facial laceration-Repair with both Derma bond- and steri-strips.        Jimmie Molly, MD 11/02/12 1329

## 2012-12-15 ENCOUNTER — Encounter (HOSPITAL_COMMUNITY): Payer: Self-pay | Admitting: Emergency Medicine

## 2012-12-15 ENCOUNTER — Emergency Department (HOSPITAL_COMMUNITY)
Admission: EM | Admit: 2012-12-15 | Discharge: 2012-12-15 | Disposition: A | Discharge status: Home / Self Care | Payer: Self-pay | Attending: Emergency Medicine | Admitting: Emergency Medicine

## 2012-12-15 DIAGNOSIS — B372 Candidiasis of skin and nail: Secondary | ICD-10-CM | POA: Insufficient documentation

## 2012-12-15 DIAGNOSIS — R7309 Other abnormal glucose: Secondary | ICD-10-CM | POA: Insufficient documentation

## 2012-12-15 DIAGNOSIS — R739 Hyperglycemia, unspecified: Secondary | ICD-10-CM

## 2012-12-15 DIAGNOSIS — F17200 Nicotine dependence, unspecified, uncomplicated: Secondary | ICD-10-CM | POA: Insufficient documentation

## 2012-12-15 DIAGNOSIS — R350 Frequency of micturition: Secondary | ICD-10-CM | POA: Insufficient documentation

## 2012-12-15 LAB — GLUCOSE, CAPILLARY: Glucose-Capillary: 231 mg/dL — ABNORMAL HIGH (ref 70–99)

## 2012-12-15 MED ORDER — NYSTATIN 100000 UNIT/GM EX OINT: TOPICAL_OINTMENT | Freq: Two times a day (BID) | CUTANEOUS | Status: DC

## 2012-12-15 MED ORDER — METFORMIN HCL 500 MG PO TABS: 500.0000 mg | ORAL_TABLET | Freq: Two times a day (BID) | ORAL | Status: DC

## 2012-12-15 NOTE — ED Provider Notes (Signed)
Medical screening examination/treatment/procedure(s) were performed by non-physician practitioner and as supervising physician I was immediately available for consultation/collaboration.   Geoffery Lyons, MD 12/15/12 1538

## 2012-12-15 NOTE — ED Notes (Signed)
Pt reports rash under both breasts. Pt reports trying cornstarch at home for relief of symptoms. Pt states rash is itchy and burning. Pt alert, oriented x4, NAD at present.

## 2012-12-15 NOTE — ED Notes (Signed)
PA made aware of CBG 

## 2012-12-15 NOTE — ED Notes (Signed)
CBG of 231 completed

## 2012-12-15 NOTE — ED Provider Notes (Signed)
History     CSN: [id number]  Arrival date & time 12/15/12  1054   First MD Initiated Contact with Patient 12/15/12 1057      Chief Complaint  Patient presents with  . Rash    (Consider location/radiation/quality/duration/timing/severity/associated sxs/prior treatment) HPI Comments: Patient presents with complaint of itchy rash under both breasts for the past 2 weeks. Patient is been using cornstarch to treat without relief. She denies fever, nausea or vomiting. Patient was told that she has had diabetes in the past but is not currently on any medications. She complains of polydipsia and polyuria. She works in a [employer], counselling. Onset of symptoms gradual. Course is gradually worsening. Nothing makes symptoms better or worse.  Patient is a 37 y.o. female presenting with rash. The history is provided by the patient.  Rash Associated symptoms: no chest pain, no cough, no diarrhea, no dysuria, no fever, no nausea, no sore throat and no vomiting     History reviewed. No pertinent past medical history.  Past Surgical History  Procedure Laterality Date  . Tubal ligation      History reviewed. No pertinent family history.  History  Substance Use Topics  . Smoking status: Current Every Day Smoker  . Smokeless tobacco: Not on file  . Alcohol Use: No    OB History   Grav Para Term Preterm Abortions TAB SAB Ect Mult Living                  Review of Systems  Constitutional: Negative for fever.  HENT: Negative for sore throat and rhinorrhea.   Eyes: Negative for redness.  Respiratory: Negative for cough.   Cardiovascular: Negative for chest pain.  Gastrointestinal: Negative for nausea, vomiting, abdominal pain and diarrhea.  Genitourinary: Positive for frequency. Negative for dysuria.  Musculoskeletal: Negative for myalgias.  Skin: Positive for rash.  Neurological: Negative for headaches.    Allergies  Sulfa antibiotics  Home Medications   Current Outpatient Rx  Name   Route  Sig  Dispense  Refill  . nystatin ointment (MYCOSTATIN)   Topical   Apply topically 2 (two) times daily.   30 g   0     BP 124/65  Pulse 76  Temp(Src) 98.3 F (36.8 C) (Oral)  Resp 16  SpO2 97%  LMP 11/20/2012  Physical Exam  Nursing note and vitals reviewed. Constitutional: She appears well-developed and well-nourished.  HENT:  Head: Normocephalic and atraumatic.  Eyes: Conjunctivae are normal. Right eye exhibits no discharge. Left eye exhibits no discharge.  Neck: Normal range of motion. Neck supple.  Cardiovascular: Normal rate, regular rhythm and normal heart sounds.   Pulmonary/Chest: Effort normal and breath sounds normal.  Abdominal: Soft. There is no tenderness.  Neurological: She is alert.  Skin: Skin is warm and dry.  Intertrigo with satellite lesions consistent with fungal infection.  Psychiatric: She has a normal mood and affect.    ED Course  Procedures (including critical care time)  Labs Reviewed - No data to display No results found.   1. Intertriginous candidiasis    11:54 AM Patient seen and examined. CBG ordered.    Vital signs reviewed and are as follows: Filed Vitals:   12/15/12 1100  BP: 124/65  Pulse: 76  Temp: 98.3 F (36.8 C)  Resp: 16   Will discharge home with medication for hyperglycemia. Encourage patient to followup with primary care physician. Referrals given.  MDM  Rash: Consistent with intertrigo  Hyperglycemia without ketosis: Metformin, PCP followup  Renne Crigler, PA-C 12/15/12 1159

## 2013-01-23 ENCOUNTER — Encounter (HOSPITAL_COMMUNITY): Payer: Self-pay | Admitting: *Deleted

## 2013-01-23 ENCOUNTER — Emergency Department (HOSPITAL_COMMUNITY)
Admission: EM | Admit: 2013-01-23 | Discharge: 2013-01-23 | Disposition: A | Discharge status: Home / Self Care | Payer: Self-pay | Attending: Emergency Medicine | Admitting: Emergency Medicine

## 2013-01-23 ENCOUNTER — Emergency Department (HOSPITAL_COMMUNITY): Payer: Self-pay

## 2013-01-23 DIAGNOSIS — Y9389 Activity, other specified: Secondary | ICD-10-CM | POA: Insufficient documentation

## 2013-01-23 DIAGNOSIS — F17200 Nicotine dependence, unspecified, uncomplicated: Secondary | ICD-10-CM | POA: Insufficient documentation

## 2013-01-23 DIAGNOSIS — W108XXA Fall (on) (from) other stairs and steps, initial encounter: Secondary | ICD-10-CM | POA: Insufficient documentation

## 2013-01-23 DIAGNOSIS — S8261XA Displaced fracture of lateral malleolus of right fibula, initial encounter for closed fracture: Secondary | ICD-10-CM

## 2013-01-23 DIAGNOSIS — X500XXA Overexertion from strenuous movement or load, initial encounter: Secondary | ICD-10-CM | POA: Insufficient documentation

## 2013-01-23 DIAGNOSIS — S8263XA Displaced fracture of lateral malleolus of unspecified fibula, initial encounter for closed fracture: Secondary | ICD-10-CM | POA: Insufficient documentation

## 2013-01-23 DIAGNOSIS — R609 Edema, unspecified: Secondary | ICD-10-CM | POA: Insufficient documentation

## 2013-01-23 DIAGNOSIS — Y92009 Unspecified place in unspecified non-institutional (private) residence as the place of occurrence of the external cause: Secondary | ICD-10-CM | POA: Insufficient documentation

## 2013-01-23 MED ORDER — OXYCODONE-ACETAMINOPHEN 5-325 MG PO TABS: 1.0000 | ORAL_TABLET | ORAL | Status: DC | PRN

## 2013-01-23 MED ORDER — OXYCODONE-ACETAMINOPHEN 5-325 MG PO TABS
2.0000 | ORAL_TABLET | Freq: Once | ORAL | Status: AC
  Administered 2013-01-23: 2 via ORAL
  Filled 2013-01-23: qty 2

## 2013-01-23 MED ORDER — METFORMIN HCL 500 MG PO TABS: 500.0000 mg | ORAL_TABLET | Freq: Two times a day (BID) | ORAL | Status: DC

## 2013-01-23 NOTE — ED Notes (Signed)
Pt in route to Radiology at this time.  

## 2013-01-23 NOTE — ED Provider Notes (Signed)
CSN: [id number]     Arrival date & time 01/23/13  0031 History     First MD Initiated Contact with Patient 01/23/13 0103     Chief Complaint  Patient presents with  . Ankle Injury   (Consider location/radiation/quality/duration/timing/severity/associated sxs/prior Treatment) HPI History provided by pt.   Pt slipped while coming down the stairs this evening, landed on her buttocks and twisted her right ankle.  C/o severe pain in ankle and foot w/ inability to bear weight.  Has not taken anything for sx.  Associated w/ edema.  Denies paresthesias.   History reviewed. No pertinent past medical history. Past Surgical History  Procedure Laterality Date  . Tubal ligation     No family history on file. History  Substance Use Topics  . Smoking status: Current Every Day Smoker  . Smokeless tobacco: Not on file  . Alcohol Use: No   OB History   Grav Para Term Preterm Abortions TAB SAB Ect Mult Living                 Review of Systems  All other systems reviewed and are negative.    Allergies  Sulfa antibiotics  Home Medications   Current Outpatient Rx  Name  Route  Sig  Dispense  Refill  . oxyCODONE-acetaminophen (PERCOCET/ROXICET) 5-325 MG per tablet   Oral   Take 1 tablet by mouth every 4 (four) hours as needed for pain.   20 tablet   0    BP 123/70  Pulse 100  Temp(Src) 98.3 F (36.8 C)  Resp 20  SpO2 99%  LMP 01/16/2013 Physical Exam  Nursing note and vitals reviewed. Constitutional: She is oriented to person, place, and time. She appears well-developed and well-nourished. No distress.  Patient crying  HENT:  Head: Normocephalic and atraumatic.  Eyes:  Normal appearance  Neck: Normal range of motion.  Pulmonary/Chest: Effort normal.  Musculoskeletal: Normal range of motion.  No deformity of ankle or foot.  Edema at lateral malleolus.  Tenderness at and inferior to lateral malleolus as well as entire foot w/ guarding.  Pain w/ minimal passive ROM of ankle.   2+ DP pulse and distal sensation intact.    Neurological: She is alert and oriented to person, place, and time.  Psychiatric: She has a normal mood and affect. Her behavior is normal.    ED Course   Procedures (including critical care time)  Labs Reviewed - No data to display Dg Ankle Complete Right  01/23/2013   *RADIOLOGY REPORT*  Clinical Data: Right ankle injury with lateral pain.  RIGHT ANKLE - COMPLETE 3+ VIEW  Comparison: None.  Findings: There is soft tissue swelling predominately localizing to the lateral ankle.  Focal cortical avulsion off of the tip of the lateral malleolus shows minimal displacement.  The ankle mortise shows normal alignment.  IMPRESSION: Avulsion fracture of the tip of the lateral malleolus.   Original Report Authenticated By: Irish Lack, M.D.   1. Avulsion fracture of lateral malleolus of right fibula     MDM  36yo healthy F presents w/ right ankle injury sustained in mechanical fall last night.  Xray shows tiny avulsion fx of lateral malleolus.  NV intact on exam.  Ortho tech provided patient w/ cam walker as well as crutches d/t persistent inability to bear weight, and I treated pain w/ 2 percocet.  D/c'd home w/ same + referral to ortho and primary care.    Pt has been out of metformin for 2 months.  Cbg 220 today.  That is what it has been running at home on daily basis.  Prescription for metformin written.  2:48 AM   Otilio Miu, PA-C 01/23/13 [phone]

## 2013-01-23 NOTE — ED Notes (Signed)
The pt has pain and swelling to the rt ankle since she fell earlier tonight on her steps..  No weight bearing

## 2013-01-23 NOTE — ED Provider Notes (Signed)
Medical screening examination/treatment/procedure(s) were performed by non-physician practitioner and as supervising physician I was immediately available for consultation/collaboration.   Kayron Hicklin, MD 01/23/13 0333 

## 2013-11-23 ENCOUNTER — Emergency Department (INDEPENDENT_AMBULATORY_CARE_PROVIDER_SITE_OTHER)
Admission: EM | Admit: 2013-11-23 | Discharge: 2013-11-23 | Disposition: A | Discharge status: Home / Self Care | Payer: Self-pay | Source: Home / Self Care | Attending: Family Medicine | Admitting: Family Medicine

## 2013-11-23 ENCOUNTER — Emergency Department (INDEPENDENT_AMBULATORY_CARE_PROVIDER_SITE_OTHER): Payer: Self-pay

## 2013-11-23 ENCOUNTER — Encounter (HOSPITAL_COMMUNITY): Payer: Self-pay | Admitting: Emergency Medicine

## 2013-11-23 DIAGNOSIS — J40 Bronchitis, not specified as acute or chronic: Secondary | ICD-10-CM

## 2013-11-23 MED ORDER — TRAMADOL HCL 50 MG PO TABS: 50.0000 mg | ORAL_TABLET | Freq: Every evening | ORAL | Status: DC | PRN

## 2013-11-23 MED ORDER — AZITHROMYCIN 250 MG PO TABS: 250.0000 mg | ORAL_TABLET | Freq: Every day | ORAL | Status: DC

## 2013-11-23 MED ORDER — METHYLPREDNISOLONE ACETATE 80 MG/ML IJ SUSP
80.0000 mg | Freq: Once | INTRAMUSCULAR | Status: AC
  Administered 2013-11-23: 80 mg via INTRAMUSCULAR

## 2013-11-23 MED ORDER — METHYLPREDNISOLONE ACETATE 80 MG/ML IJ SUSP
INTRAMUSCULAR | Status: AC
  Filled 2013-11-23: qty 1

## 2013-11-23 MED ORDER — ALBUTEROL SULFATE HFA 108 (90 BASE) MCG/ACT IN AERS
2.0000 | INHALATION_SPRAY | Freq: Four times a day (QID) | RESPIRATORY_TRACT | Status: DC | PRN
  Administered 2013-11-23: 2 via RESPIRATORY_TRACT

## 2013-11-23 MED ORDER — IPRATROPIUM-ALBUTEROL 0.5-2.5 (3) MG/3ML IN SOLN
3.0000 mL | Freq: Once | RESPIRATORY_TRACT | Status: AC
  Administered 2013-11-23: 3 mL via RESPIRATORY_TRACT

## 2013-11-23 MED ORDER — IPRATROPIUM-ALBUTEROL 0.5-2.5 (3) MG/3ML IN SOLN
RESPIRATORY_TRACT | Status: AC
  Filled 2013-11-23: qty 3

## 2013-11-23 MED ORDER — ALBUTEROL SULFATE HFA 108 (90 BASE) MCG/ACT IN AERS
INHALATION_SPRAY | RESPIRATORY_TRACT | Status: AC
  Filled 2013-11-23: qty 6.7

## 2013-11-23 NOTE — ED Notes (Signed)
Patient receiving breathing treatment prior to CXR 

## 2013-11-23 NOTE — Discharge Instructions (Signed)
Thank you for coming in today. Take tramadol for cough as needed.  Fill azithromycin if not improving.  Use albuterol as needed.  Continue up to 2 aleve twice daily for pain, fever or body aches.    Bronchitis Bronchitis is inflammation of the airways that extend from the windpipe into the lungs (bronchi). The inflammation often causes mucus to develop, which leads to a cough. If the inflammation becomes severe, it may cause shortness of breath. CAUSES  Bronchitis may be caused by:   Viral infections.   Bacteria.   Cigarette smoke.   Allergens, pollutants, and other irritants.  SIGNS AND SYMPTOMS  The most common symptom of bronchitis is a frequent cough that produces mucus. Other symptoms include:  Fever.   Body aches.   Chest congestion.   Chills.   Shortness of breath.   Sore throat.  DIAGNOSIS  Bronchitis is usually diagnosed through a medical history and physical exam. Tests, such as chest X-rays, are sometimes done to rule out other conditions.  TREATMENT  You may need to avoid contact with whatever caused the problem (smoking, for example). Medicines are sometimes needed. These may include:  Antibiotics. These may be prescribed if the condition is caused by bacteria.  Cough suppressants. These may be prescribed for relief of cough symptoms.   Inhaled medicines. These may be prescribed to help open your airways and make it easier for you to breathe.   Steroid medicines. These may be prescribed for those with recurrent (chronic) bronchitis. HOME CARE INSTRUCTIONS  Get plenty of rest.   Drink enough fluids to keep your urine clear or pale yellow (unless you have a medical condition that requires fluid restriction). Increasing fluids may help thin your secretions and will prevent dehydration.   Only take over-the-counter or prescription medicines as directed by your health care provider.  Only take antibiotics as directed. Make sure you finish  them even if you start to feel better.  Avoid secondhand smoke, irritating chemicals, and strong fumes. These will make bronchitis worse. If you are a smoker, quit smoking. Consider using nicotine gum or skin patches to help control withdrawal symptoms. Quitting smoking will help your lungs heal faster.   Put a cool-mist humidifier in your bedroom at night to moisten the air. This may help loosen mucus. Change the water in the humidifier daily. You can also run the hot water in your shower and sit in the bathroom with the door closed for 5 10 minutes.   Follow up with your health care provider as directed.   Wash your hands frequently to avoid catching bronchitis again or spreading an infection to others.  SEEK MEDICAL CARE IF: Your symptoms do not improve after 1 week of treatment.  SEEK IMMEDIATE MEDICAL CARE IF:  Your fever increases.  You have chills.   You have chest pain.   You have worsening shortness of breath.   You have bloody sputum.  You faint.  You have lightheadedness.  You have a severe headache.   You vomit repeatedly. MAKE SURE YOU:   Understand these instructions.  Will watch your condition.  Will get help right away if you are not doing well or get worse. Document Released: 06/15/2005 Document Revised: 04/05/2013 Document Reviewed: 02/07/2013 [facility] Patient Information 2014 [place], [place].

## 2013-11-23 NOTE — ED Notes (Signed)
Pt c/o cold sx x3-4 days.  Sx include chills, cough, HA, chest d/c, abd pain, nauseas Denies f/v/d Taking OTC cold meds w/no relief.  Alert w/no signs of acute distress.

## 2013-11-23 NOTE — ED Provider Notes (Signed)
Bianca Yang is a 38 y.o. female who presents to Urgent Care today for cough congestion headache chills body aches. Symptoms present for 3 days. Patient has tried multiple over-the-counter medications which do not work well. No significant shortness of breath. No nausea vomiting or diarrhea. Patient feels well otherwise.   History reviewed. No pertinent past medical history. History  Substance Use Topics  . Smoking status: Current Every Day Smoker    Types: Cigarettes  . Smokeless tobacco: Not on file  . Alcohol Use: Yes   ROS as above Medications: Current Facility-Administered Medications  Medication Dose Route Frequency Provider Last Rate Last Dose  . albuterol (PROVENTIL HFA;VENTOLIN HFA) 108 (90 BASE) MCG/ACT inhaler 2 puff  2 puff Inhalation Q6H PRN Rodolph Bong, MD   2 puff at 11/23/13 [phone]   Current Outpatient Prescriptions  Medication Sig Dispense Refill  . metFORMIN (GLUCOPHAGE) 500 MG tablet Take 1 tablet (500 mg total) by mouth 2 (two) times daily with a meal.  60 tablet  1  . azithromycin (ZITHROMAX) 250 MG tablet Take 1 tablet (250 mg total) by mouth daily. Take first 2 tablets together, then 1 every day until finished.  6 tablet  0  . oxyCODONE-acetaminophen (PERCOCET/ROXICET) 5-325 MG per tablet Take 1 tablet by mouth every 4 (four) hours as needed for pain.  20 tablet  0  . traMADol (ULTRAM) 50 MG tablet Take 1 tablet (50 mg total) by mouth at bedtime as needed (cough).  10 tablet  0    Exam:  BP 121/74  Pulse 76  Temp(Src) 98.9 F (37.2 C) (Oral)  Resp 20  SpO2 100%  LMP 11/19/2013 Gen: Well NAD HEENT: EOMI,  MMM posterior pharynx with cobblestoning. Tympanic membranes are normal appearing bilaterally. Lungs: Normal work of breathing. Squeaks and wheezing present bilaterally. Frequent coughing. Heart: RRR no MRG Abd: NABS, Soft. NT, ND Exts: Brisk capillary refill, warm and well perfused.   Patient was given a DuoNeb nebulizer treatment, and felt  better  No results found for this or any previous visit (from the past 24 hour(s)). Dg Chest 2 View  11/23/2013   CLINICAL DATA:  Chest congestion  EXAM: CHEST - 2 VIEW  COMPARISON:  07/13/2012  FINDINGS: Low lung volumes. Clear lungs. Normal heart size. No pneumothorax. No pleural effusion.  IMPRESSION: No active cardiopulmonary disease.   Electronically Signed   By: Maryclare Bean M.D.   On: 11/23/2013 09:29    Assessment and Plan: 38 y.o. female with bronchitis likely secondary to viral process. Patient has no money to afford medications currently. Plan to administer 80 mg Depo-Medrol IM and dispense an albuterol inhaler. Prescribe tramadol for cough suppression if she can get the money for it. Followup as needed.  Discussed warning signs or symptoms. Please see discharge instructions. Patient expresses understanding.    Rodolph Bong, MD 11/23/13 1003

## 2013-12-04 ENCOUNTER — Encounter (HOSPITAL_COMMUNITY): Payer: Self-pay | Admitting: Emergency Medicine

## 2013-12-04 ENCOUNTER — Emergency Department (HOSPITAL_COMMUNITY)
Admission: EM | Admit: 2013-12-04 | Discharge: 2013-12-04 | Disposition: A | Discharge status: Home / Self Care | Payer: Self-pay | Attending: Emergency Medicine | Admitting: Emergency Medicine

## 2013-12-04 DIAGNOSIS — F17200 Nicotine dependence, unspecified, uncomplicated: Secondary | ICD-10-CM | POA: Insufficient documentation

## 2013-12-04 DIAGNOSIS — L02411 Cutaneous abscess of right axilla: Secondary | ICD-10-CM

## 2013-12-04 DIAGNOSIS — Z792 Long term (current) use of antibiotics: Secondary | ICD-10-CM | POA: Insufficient documentation

## 2013-12-04 DIAGNOSIS — Z79899 Other long term (current) drug therapy: Secondary | ICD-10-CM | POA: Insufficient documentation

## 2013-12-04 DIAGNOSIS — IMO0002 Reserved for concepts with insufficient information to code with codable children: Secondary | ICD-10-CM | POA: Insufficient documentation

## 2013-12-04 DIAGNOSIS — E119 Type 2 diabetes mellitus without complications: Secondary | ICD-10-CM | POA: Insufficient documentation

## 2013-12-04 HISTORY — DX: Type 2 diabetes mellitus without complications: E11.9

## 2013-12-04 MED ORDER — OXYCODONE-ACETAMINOPHEN 5-325 MG PO TABS
1.0000 | ORAL_TABLET | Freq: Once | ORAL | Status: AC
  Administered 2013-12-04: 1 via ORAL
  Filled 2013-12-04: qty 1

## 2013-12-04 MED ORDER — HYDROCODONE-ACETAMINOPHEN 5-325 MG PO TABS: 1.0000 | ORAL_TABLET | ORAL | Status: DC | PRN

## 2013-12-04 MED ORDER — METFORMIN HCL 500 MG PO TABS: 500.0000 mg | ORAL_TABLET | Freq: Two times a day (BID) | ORAL | Status: DC

## 2013-12-04 MED ORDER — CEPHALEXIN 500 MG PO CAPS: 500.0000 mg | ORAL_CAPSULE | Freq: Four times a day (QID) | ORAL | Status: DC

## 2013-12-04 NOTE — ED Notes (Addendum)
CBG 207 

## 2013-12-04 NOTE — Discharge Instructions (Signed)
1. Medications: Vicodin, Keflex, metformin refill, usual home medications 2. Treatment: rest, drink plenty of fluids,  3. Follow Up: Please followup at [facility] urgent care or the wellness clinic for wound check in 2 days, followup with the wellness clinic within one week for further evaluation of your diabetes and medication management.    Abscess An abscess is an infected area that contains a collection of pus and debris.It can occur in almost any part of the body. An abscess is also known as a furuncle or boil. CAUSES  An abscess occurs when tissue gets infected. This can occur from blockage of oil or sweat glands, infection of hair follicles, or a minor injury to the skin. As the body tries to fight the infection, pus collects in the area and creates pressure under the skin. This pressure causes pain. People with weakened immune systems have difficulty fighting infections and get certain abscesses more often.  SYMPTOMS Usually an abscess develops on the skin and becomes a painful mass that is red, warm, and tender. If the abscess forms under the skin, you may feel a moveable soft area under the skin. Some abscesses break open (rupture) on their own, but most will continue to get worse without care. The infection can spread deeper into the body and eventually into the bloodstream, causing you to feel ill.  DIAGNOSIS  Your caregiver will take your medical history and perform a physical exam. A sample of fluid may also be taken from the abscess to determine what is causing your infection. TREATMENT  Your caregiver may prescribe antibiotic medicines to fight the infection. However, taking antibiotics alone usually does not cure an abscess. Your caregiver may need to make a small cut (incision) in the abscess to drain the pus. In some cases, gauze is packed into the abscess to reduce pain and to continue draining the area. HOME CARE INSTRUCTIONS   Only take over-the-counter or prescription  medicines for pain, discomfort, or fever as directed by your caregiver.  If you were prescribed antibiotics, take them as directed. Finish them even if you start to feel better.  If gauze is used, follow your caregiver's directions for changing the gauze.  To avoid spreading the infection:  Keep your draining abscess covered with a bandage.  Wash your hands well.  Do not share personal care items, towels, or whirlpools with others.  Avoid skin contact with others.  Keep your skin and clothes clean around the abscess.  Keep all follow-up appointments as directed by your caregiver. SEEK MEDICAL CARE IF:   You have increased pain, swelling, redness, fluid drainage, or bleeding.  You have muscle aches, chills, or a general ill feeling.  You have a fever. MAKE SURE YOU:   Understand these instructions.  Will watch your condition.  Will get help right away if you are not doing well or get worse. Document Released: 03/25/2005 Document Revised: 12/15/2011 Document Reviewed: 08/28/2011 [facility] Patient Information 2014 [place], [place].

## 2013-12-04 NOTE — ED Notes (Signed)
The patient said it started as a "knot" under her arm and it began to get red, swollen.  The patient said it got worse overnight.  She has been putting "prid" on it and it has been tingling, pulling and feeling worse in the last 24hrs.  The patient rates her pain10/10 and came here to be evaluated.

## 2013-12-04 NOTE — ED Notes (Signed)
Pt. Requesting something for pain.

## 2013-12-04 NOTE — ED Provider Notes (Signed)
CSN: [id number]     Arrival date & time 12/04/13  1453 History   First MD Initiated Contact with Patient 12/04/13 1816     Chief Complaint  Patient presents with  . Abscess    Under her right arm, swelling, redness and rates pain 10/10     (Consider location/radiation/quality/duration/timing/severity/associated sxs/prior Treatment) Patient is a 38 y.o. female presenting with abscess. The history is provided by the patient and medical records. No language interpreter was used.  Abscess Associated symptoms: no fever, no headaches, no nausea and no vomiting     Bianca Yang is a 38 y.o. female  with a hx of non-insulin-dependent diabetes presents to the Emergency Department complaining of gradual, persistent, progressively worsening "knot" in the right axilla onset 3 days ago but acutely worsening over night.  Patient reports that she has been using warm compresses without improvement.  Associated symptoms include pain, erythema and swelling at the site.  Nothing makes it better and nothing makes it worse.  Pt denies fever, chills, headache, neck pain chest pain, shortness of breath abdominal pain, nausea, vomiting, diarrhea.  Patient reports that she does not regularly check her blood sugars.     Past Medical History  Diagnosis Date  . Diabetes mellitus without complication    Past Surgical History  Procedure Laterality Date  . Tubal ligation     History reviewed. No pertinent family history. History  Substance Use Topics  . Smoking status: Current Every Day Smoker    Types: Cigarettes  . Smokeless tobacco: Not on file  . Alcohol Use: Yes     Comment: socially   OB History   Grav Para Term Preterm Abortions TAB SAB Ect Mult Living                 Review of Systems  Constitutional: Negative for fever and chills.  Respiratory: Negative for shortness of breath.   Cardiovascular: Negative for chest pain.  Gastrointestinal: Negative for nausea, vomiting and abdominal pain.   Endocrine: Negative for polydipsia, polyphagia and polyuria.  Genitourinary: Negative for dysuria.  Skin: Positive for color change.  Allergic/Immunologic: Negative for immunocompromised state.  Neurological: Negative for headaches.  Hematological: Does not bruise/bleed easily.  Psychiatric/Behavioral: The patient is not nervous/anxious.       Allergies  Sulfa antibiotics  Home Medications   Prior to Admission medications   Medication Sig Start Date End Date Taking? Authorizing Provider  azithromycin (ZITHROMAX) 250 MG tablet Take 1 tablet (250 mg total) by mouth daily. Take first 2 tablets together, then 1 every day until finished. 11/23/13   Rodolph Bong, MD  cephALEXin (KEFLEX) 500 MG capsule Take 1 capsule (500 mg total) by mouth 4 (four) times daily. 12/04/13   Grafton Warzecha, PA-C  HYDROcodone-acetaminophen (NORCO/VICODIN) 5-325 MG per tablet Take 1-2 tablets by mouth every 4 (four) hours as needed for moderate pain or severe pain. 12/04/13   Clella Mckeel, PA-C  metFORMIN (GLUCOPHAGE) 500 MG tablet Take 1 tablet (500 mg total) by mouth 2 (two) times daily with a meal. 12/04/13   Angell Pincock, PA-C  oxyCODONE-acetaminophen (PERCOCET/ROXICET) 5-325 MG per tablet Take 1 tablet by mouth every 4 (four) hours as needed for pain. 01/23/13   Arie Sabina Schinlever, PA-C  traMADol (ULTRAM) 50 MG tablet Take 1 tablet (50 mg total) by mouth at bedtime as needed (cough). 11/23/13   Rodolph Bong, MD   BP 129/63  Pulse 73  Temp(Src) 97.2 F (36.2 C) (Oral)  Resp  16  SpO2 97%  LMP 11/19/2013 Physical Exam  Nursing note and vitals reviewed. Constitutional: She is oriented to person, place, and time. She appears well-developed and well-nourished. No distress.  HENT:  Head: Normocephalic and atraumatic.  Eyes: Conjunctivae are normal. No scleral icterus.  Neck: Normal range of motion.  Cardiovascular: Normal rate, regular rhythm, normal heart sounds and intact distal pulses.    No murmur heard. Pulmonary/Chest: Effort normal and breath sounds normal. No respiratory distress. She has no wheezes.  Abdominal: Soft. Bowel sounds are normal. She exhibits no distension. There is no tenderness.  Musculoskeletal: Normal range of motion.  Full range of motion of the right shoulder without difficulty  Lymphadenopathy:    She has no cervical adenopathy.  Neurological: She is alert and oriented to person, place, and time.  Skin: Skin is warm and dry. She is not diaphoretic. There is erythema.  Large 4 x 4 cm erythematous abscess to the right axilla; large area of fluctuance; minimal induration  Psychiatric: She has a normal mood and affect.    ED Course  INCISION AND DRAINAGE Date/Time: 12/04/2013 6:56 PM Performed by: Dierdre ForthMUTHERSBAUGH, Vondell Sowell Authorized by: Dierdre ForthMUTHERSBAUGH, Chamille Werntz Consent: Verbal consent obtained. Risks and benefits: risks, benefits and alternatives were discussed Consent given by: patient Patient understanding: patient states understanding of the procedure being performed Patient consent: the patient's understanding of the procedure matches consent given Procedure consent: procedure consent matches procedure scheduled Relevant documents: relevant documents present and verified Site marked: the operative site was marked Required items: required blood products, implants, devices, and special equipment available Patient identity confirmed: verbally with patient and arm band Time out: Immediately prior to procedure a "time out" was called to verify the correct patient, procedure, equipment, support staff and site/side marked as required. Type: abscess Body area: upper extremity (right axillla) Anesthesia: local infiltration Local anesthetic: lidocaine 2% without epinephrine Anesthetic total: 5 ml Patient sedated: no Scalpel size: 11 Incision type: single straight Complexity: complex Drainage: purulent Drainage amount: copious Wound treatment: wound left  open Packing material: none Patient tolerance: Patient tolerated the procedure well with no immediate complications.   (including critical care time) Labs Review Labs Reviewed  CBG MONITORING, ED    Imaging Review No results found.   EKG Interpretation None      MDM   Final diagnoses:  Abscess of right axilla  Diabetes mellitus   Bianca Yang presents with abscess to the right axilla. Patient with history of non-insulin-dependent diabetes. She does not regularly check her blood sugars. She reports she's been taking her metformin as directed.  Patient denies polydipsia, polyuria, polyphagia, fatigue.  Patient with skin abscess amenable to incision and drainage.  Abscess was not large enough to warrant packing or drain, wound recheck in 2 days. Encouraged home warm soaks and flushing.  Mild signs of cellulitis is surrounding skin.  Will d/c to home with keflex as pt has hx of diabetes.  CBG 207 in the ED today.    I have personally reviewed patient's vitals, nursing note and any pertinent labs or imaging.  I performed an undressed physical exam.    At this time, it has been determined that no acute conditions requiring further emergency intervention. The patient/guardian have been advised of the diagnosis and plan. I reviewed all labs and imaging including any potential incidental findings. We have discussed signs and symptoms that warrant return to the ED, such as fevers, worsening redness nausea or vomiting.  Patient/guardian has voiced understanding and agreed  to follow-up with the PCP or specialist in 48 hours for wound check.  Vital signs are stable at discharge.   BP 129/63  Pulse 73  Temp(Src) 97.2 F (36.2 C) (Oral)  Resp 16  SpO2 97%  LMP 11/19/2013        Dierdre Forth, PA-C 12/04/13 1928

## 2013-12-05 LAB — CBG MONITORING, ED: Glucose-Capillary: 207 mg/dL — ABNORMAL HIGH (ref 70–99)

## 2013-12-06 ENCOUNTER — Telehealth (HOSPITAL_BASED_OUTPATIENT_CLINIC_OR_DEPARTMENT_OTHER): Payer: Self-pay

## 2013-12-06 NOTE — Telephone Encounter (Signed)
Pt calling to find out when work note had her returning to work and needing new referral to check drainage advised to return as needed

## 2013-12-07 NOTE — ED Provider Notes (Signed)
Medical screening examination/treatment/procedure(s) were performed by non-physician practitioner and as supervising physician I was immediately available for consultation/collaboration.   EKG Interpretation None        Paije Goodhart, MD 12/07/13 1131 

## 2013-12-26 ENCOUNTER — Ambulatory Visit: Payer: Self-pay

## 2014-01-30 ENCOUNTER — Emergency Department (HOSPITAL_COMMUNITY): Payer: Self-pay

## 2014-01-30 ENCOUNTER — Encounter (HOSPITAL_COMMUNITY): Payer: Self-pay | Admitting: Emergency Medicine

## 2014-01-30 ENCOUNTER — Emergency Department (HOSPITAL_COMMUNITY)
Admission: EM | Admit: 2014-01-30 | Discharge: 2014-01-30 | Disposition: A | Discharge status: Home / Self Care | Payer: Self-pay | Attending: Emergency Medicine | Admitting: Emergency Medicine

## 2014-01-30 DIAGNOSIS — M25531 Pain in right wrist: Secondary | ICD-10-CM

## 2014-01-30 DIAGNOSIS — Z79899 Other long term (current) drug therapy: Secondary | ICD-10-CM | POA: Insufficient documentation

## 2014-01-30 DIAGNOSIS — R739 Hyperglycemia, unspecified: Secondary | ICD-10-CM

## 2014-01-30 DIAGNOSIS — F17200 Nicotine dependence, unspecified, uncomplicated: Secondary | ICD-10-CM | POA: Insufficient documentation

## 2014-01-30 DIAGNOSIS — M25539 Pain in unspecified wrist: Secondary | ICD-10-CM | POA: Insufficient documentation

## 2014-01-30 DIAGNOSIS — E119 Type 2 diabetes mellitus without complications: Secondary | ICD-10-CM | POA: Insufficient documentation

## 2014-01-30 DIAGNOSIS — Z792 Long term (current) use of antibiotics: Secondary | ICD-10-CM | POA: Insufficient documentation

## 2014-01-30 LAB — CBG MONITORING, ED: Glucose-Capillary: 233 mg/dL — ABNORMAL HIGH (ref 70–99)

## 2014-01-30 MED ORDER — OXYCODONE-ACETAMINOPHEN 5-325 MG PO TABS
1.0000 | ORAL_TABLET | Freq: Once | ORAL | Status: AC
  Administered 2014-01-30: 1 via ORAL
  Filled 2014-01-30: qty 1

## 2014-01-30 MED ORDER — MELOXICAM 7.5 MG PO TABS
7.5000 mg | ORAL_TABLET | Freq: Every day | ORAL | Status: DC
Start: 2014-01-30 — End: 2014-03-22

## 2014-01-30 MED ORDER — IBUPROFEN 400 MG PO TABS
800.0000 mg | ORAL_TABLET | Freq: Once | ORAL | Status: AC
  Administered 2014-01-30: 800 mg via ORAL
  Filled 2014-01-30: qty 2

## 2014-01-30 MED ORDER — HYDROCODONE-ACETAMINOPHEN 5-325 MG PO TABS: ORAL_TABLET | ORAL | Status: DC

## 2014-01-30 NOTE — ED Provider Notes (Signed)
Medical screening examination/treatment/procedure(s) were conducted as a shared visit with non-physician practitioner(s) and myself.  I personally evaluated the patient during the encounter.  Pt c/o pain dorsum right wrist.  +repetitive use. No redness or increased warmth. Skin intact. Normal cap refill distally in digits.    Suzi RootsKevin E Paxtyn Wisdom, MD 01/30/14 [phone]

## 2014-01-30 NOTE — ED Provider Notes (Signed)
CSN: [id number]     Arrival date & time 01/30/14  4098 History   First MD Initiated Contact with Patient 01/30/14 0800     Chief Complaint  Patient presents with  . Wrist Pain   (Consider location/radiation/quality/duration/timing/severity/associated sxs/prior Treatment) HPI Comments: Patient presents with complaint of right wrist pain which she first noticed about 9 or 10 PM last night. 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 She is right-handed. She denies any injuries to her wrist or hand. No recent significant wounds. No history of arthritis. No treatments prior to arrival. No fevers or chills. Patient has been told that she is diabetic in the past but is currently not taking medications. The onset of this condition was acute. The course is worsening. Aggravating factors: movement, palpation. Alleviating factors: rest.    The history is provided by the patient.    Past Medical History  Diagnosis Date  . Diabetes mellitus without complication    Past Surgical History  Procedure Laterality Date  . Tubal ligation     No family history on file. History  Substance Use Topics  . Smoking status: Current Every Day Smoker    Types: Cigarettes  . Smokeless tobacco: Not on file  . Alcohol Use: Yes     Comment: socially   OB History   Grav Para Term Preterm Abortions TAB SAB Ect Mult Living                 Review of Systems  Constitutional: Negative for fever and chills.  Gastrointestinal: Negative for nausea and vomiting.  Musculoskeletal: Positive for arthralgias and joint swelling.  Skin: Negative for color change.  Neurological: Negative for numbness.    Allergies  Sulfa antibiotics  Home Medications   Prior to Admission medications   Medication Sig Start Date End Date Taking? Authorizing Provider  azithromycin (ZITHROMAX) 250 MG tablet Take 1 tablet (250 mg total) by mouth daily. Take first 2 tablets together, then 1 every day until finished.  11/23/13   Rodolph Bong, MD  cephALEXin (KEFLEX) 500 MG capsule Take 1 capsule (500 mg total) by mouth 4 (four) times daily. 12/04/13   Hannah Muthersbaugh, PA-C  HYDROcodone-acetaminophen (NORCO/VICODIN) 5-325 MG per tablet Take 1-2 tablets by mouth every 4 (four) hours as needed for moderate pain or severe pain. 12/04/13   Hannah Muthersbaugh, PA-C  metFORMIN (GLUCOPHAGE) 500 MG tablet Take 1 tablet (500 mg total) by mouth 2 (two) times daily with a meal. 12/04/13   Hannah Muthersbaugh, PA-C  oxyCODONE-acetaminophen (PERCOCET/ROXICET) 5-325 MG per tablet Take 1 tablet by mouth every 4 (four) hours as needed for pain. 01/23/13   Arie Sabina Schinlever, PA-C  traMADol (ULTRAM) 50 MG tablet Take 1 tablet (50 mg total) by mouth at bedtime as needed (cough). 11/23/13   Rodolph Bong, MD   BP 142/75  Pulse 61  Temp(Src) 98.2 F (36.8 C) (Oral)  Resp 18  Ht 5\' 8"  (1.727 m)  Wt 196 lb (88.905 kg)  BMI 29.81 kg/m2  SpO2 100%  LMP 01/24/2014  Physical Exam  Nursing note and vitals reviewed. Constitutional: She appears well-developed and well-nourished.  HENT:  Head: Normocephalic and atraumatic.  Eyes: Pupils are equal, round, and reactive to light.  Neck: Normal range of motion. Neck supple.  Cardiovascular: Exam reveals no decreased pulses.   Musculoskeletal: She exhibits edema and tenderness.       Right elbow: Normal.      Right wrist: She exhibits decreased  range of motion, tenderness, bony tenderness, swelling and effusion.       Right forearm: Normal.       Right hand: Normal. She exhibits normal range of motion and normal capillary refill. Normal sensation noted. Normal strength noted.  Neurological: She is alert. No sensory deficit.  Motor, sensation, and vascular distal to the injury is fully intact.   Skin: Skin is warm and dry.  Psychiatric: She has a normal mood and affect.    ED Course  Procedures (including critical care time) Labs Review Labs Reviewed  CBG MONITORING, ED -  Abnormal; Notable for the following:    Glucose-Capillary 233 (*)    All other components within normal limits    Imaging Review Dg Wrist Complete Right  01/30/2014   CLINICAL DATA:  Right wrist pain, no known injury  EXAM: RIGHT WRIST - COMPLETE 3+ VIEW  COMPARISON:  None.  FINDINGS: Four views of the right wrist submitted. No acute fracture or subluxation. Tiny well corticated bony fragment adjacent to distal radius on lateral view is most likely from prior injury.  IMPRESSION: Negative.   Electronically Signed   By: Natasha MeadLiviu  Pop M.D.   On: 01/30/2014 08:35     EKG Interpretation None      8:06 AM Patient seen and examined. Work-up initiated. Medications ordered.   Vital signs reviewed and are as follows: Filed Vitals:   01/30/14 0905  BP: 142/75  Pulse: 61  Temp:   Resp: 18   9:10 AM Given the amount of pain on exam, patient was discussed with and seen by Dr. Denton LankSteinl. Low suspicion for septic arthritis. Plan: ortho f/u, NSAIDs, analgesia, wrist splint, RICE.   Pt informed of plan and verbalizes agreement. No work for 72 hrs.   Patient counseled on use of narcotic pain medications. Counseled not to combine these medications with others containing tylenol. Urged not to drink alcohol, drive, or perform any other activities that requires focus while taking these medications. The patient verbalizes understanding and agrees with the plan.  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 X-rays negative. Plan per above. Patient seen with attending physician. Very low suspicion for septic arthritis at this point. No obvious portal of entry for bacteria into joint.  Hyperglycemia: No signs of DKA. Patient has been on metformin in the past however she is currently not compliant. PCP referral given.    Renne CriglerJoshua Bienvenido Proehl, PA-C 01/30/14 [phone]

## 2014-01-30 NOTE — Discharge Instructions (Signed)
Please read and follow all provided instructions.  Your diagnoses today include:  1. Wrist pain, acute, right     Tests performed today include:  An x-ray of the affected area - does NOT show any broken bones  Blood sugar - is high  Vital signs. See below for your results today.   Medications prescribed:   Vicodin (hydrocodone/acetaminophen) - narcotic pain medication  DO NOT drive or perform any activities that require you to be awake and alert because this medicine can make you drowsy. BE VERY CAREFUL not to take multiple medicines containing Tylenol (also called acetaminophen). Doing so can lead to an overdose which can damage your liver and cause liver failure and possibly death.   Meloxicam - anti-inflammatory pain medication  You have been prescribed an anti-inflammatory medication or NSAID. Take with food. Do not take aspirin, ibuprofen, or naproxen if taking this medication. Take smallest effective dose for the shortest duration needed for your pain. Stop taking if you experience stomach pain or vomiting.   Take any prescribed medications only as directed.  Home care instructions:   Follow any educational materials contained in this packet  Follow R.I.C.E. Protocol:  R - rest your injury   I  - use ice on injury without applying directly to skin  C - compress injury with bandage or splint  E - elevate the injury as much as possible  Follow-up instructions: Please follow-up with the provided orthopedic physician (bone specialist) in the next 3 days.   Return instructions:   Please return if you develop a redness over the wrist, worsening swelling, fever, or severe uncontrolled pain  Please return to the Emergency Department if you experience worsening symptoms.   Please return if you have any other emergent concerns.  Additional Information:  Your vital signs today were: BP 120/68   Pulse 67   Temp(Src) 98.2 F (36.8 C) (Oral)   Resp 18   Ht 5\' 8"  (1.727  m)   Wt 196 lb (88.905 kg)   BMI 29.81 kg/m2   SpO2 100%   LMP 01/24/2014 If your blood pressure (BP) was elevated above 135/85 this visit, please have this repeated by your doctor within one month. --------------

## 2014-01-30 NOTE — ED Notes (Signed)
CBG 233 

## 2014-01-30 NOTE — ED Notes (Signed)
Patient states started having R wrist pain last night.  Patient denies injury.  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 Patient states 8/10 pain, but did not take anything for the pain at home.

## 2014-03-22 ENCOUNTER — Emergency Department (HOSPITAL_COMMUNITY)
Admission: EM | Admit: 2014-03-22 | Discharge: 2014-03-22 | Disposition: A | Discharge status: Home / Self Care | Payer: Self-pay | Attending: Emergency Medicine | Admitting: Emergency Medicine

## 2014-03-22 ENCOUNTER — Emergency Department (HOSPITAL_COMMUNITY): Payer: Self-pay

## 2014-03-22 ENCOUNTER — Encounter (HOSPITAL_COMMUNITY): Payer: Self-pay | Admitting: Emergency Medicine

## 2014-03-22 DIAGNOSIS — R05 Cough: Secondary | ICD-10-CM | POA: Insufficient documentation

## 2014-03-22 DIAGNOSIS — F17200 Nicotine dependence, unspecified, uncomplicated: Secondary | ICD-10-CM | POA: Insufficient documentation

## 2014-03-22 DIAGNOSIS — J069 Acute upper respiratory infection, unspecified: Secondary | ICD-10-CM | POA: Insufficient documentation

## 2014-03-22 DIAGNOSIS — Z79899 Other long term (current) drug therapy: Secondary | ICD-10-CM | POA: Insufficient documentation

## 2014-03-22 DIAGNOSIS — E119 Type 2 diabetes mellitus without complications: Secondary | ICD-10-CM | POA: Insufficient documentation

## 2014-03-22 DIAGNOSIS — R059 Cough, unspecified: Secondary | ICD-10-CM | POA: Insufficient documentation

## 2014-03-22 MED ORDER — SALINE SPRAY 0.65 % NA SOLN: 1.0000 | NASAL | Status: DC | PRN

## 2014-03-22 MED ORDER — BENZONATATE 100 MG PO CAPS: 100.0000 mg | ORAL_CAPSULE | Freq: Three times a day (TID) | ORAL | Status: DC

## 2014-03-22 MED ORDER — GUAIFENESIN 100 MG/5ML PO LIQD: 100.0000 mg | ORAL | Status: DC | PRN

## 2014-03-22 MED ORDER — IBUPROFEN 400 MG PO TABS
600.0000 mg | ORAL_TABLET | Freq: Once | ORAL | Status: AC
  Administered 2014-03-22: 600 mg via ORAL
  Filled 2014-03-22 (×3): qty 1

## 2014-03-22 NOTE — ED Notes (Signed)
Patient tolerating PO fluid challenge.  Able to drink 8 oz cup of water with no problems.

## 2014-03-22 NOTE — ED Notes (Signed)
Pt c/o URI sx with cough and body aches x 3 days; pt sts nasal congestion

## 2014-03-22 NOTE — ED Provider Notes (Signed)
CSN: [id number]     Arrival date & time 03/22/14  1511 History  This chart was scribed for a non-physician practitioner, Junius Finner, PA-C working with Raeford Razor, MD by [place] Peace, ED Scribe. The patient was seen in [facility]. The patient's care was started at 4:26 PM.    Chief Complaint  Patient presents with  . URI  . Cough      Patient is a 38 y.o. female presenting with URI and cough. The history is provided by the patient. No language interpreter was used.  URI Presenting symptoms: congestion, cough (productive), fatigue, rhinorrhea and sore throat   Associated symptoms: wheezing   Cough Associated symptoms: chills, rhinorrhea, sore throat and wheezing    HPI Comments: Bianca Yang is a 38 y.o. female who presents to the Emergency Department complaining of URI symptoms onset 3 days consisting of sore throat, congestion, rhinorrhea, productive cough, wheezing, body aches, fatigue and chills. She reports some nausea earlier today but denies vomiting. She notes that she may have Type II DM but states she has not been formally diagnosed as she does not have a PCP. Pt also states that she has been taking sinus medication at home without relief. She denies receiving flu vaccinee at any point this year. Pt denies having PCP. Pt is current everyday smoker (0.5 packs/ day). Denies hx of asthma. No known sick contacts.   Past Medical History  Diagnosis Date  . Diabetes mellitus without complication    Past Surgical History  Procedure Laterality Date  . Tubal ligation     History reviewed. No pertinent family history. History  Substance Use Topics  . Smoking status: Current Every Day Smoker -- 0.50 packs/day    Types: Cigarettes  . Smokeless tobacco: Not on file  . Alcohol Use: Yes     Comment: socially   OB History   Grav Para Term Preterm Abortions TAB SAB Ect Mult Living                 Review of Systems  Constitutional: Positive for chills and fatigue.   HENT: Positive for congestion, rhinorrhea and sore throat.   Respiratory: Positive for cough (productive) and wheezing.   Gastrointestinal: Positive for nausea. Negative for vomiting.  Musculoskeletal:       Generalized body aches.   All other systems reviewed and are negative.     Allergies  Sulfa antibiotics  Home Medications   Prior to Admission medications   Medication Sig Start Date End Date Taking? Authorizing Provider  metFORMIN (GLUCOPHAGE) 500 MG tablet Take 1 tablet (500 mg total) by mouth 2 (two) times daily with a meal. 12/04/13  Yes Hannah Muthersbaugh, PA-C  benzonatate (TESSALON) 100 MG capsule Take 1 capsule (100 mg total) by mouth every 8 (eight) hours. 03/22/14   Junius Finner, PA-C  guaiFENesin (ROBITUSSIN) 100 MG/5ML liquid Take 5-10 mLs (100-200 mg total) by mouth every 4 (four) hours as needed for cough. 03/22/14   Junius Finner, PA-C  sodium chloride (OCEAN) 0.65 % SOLN nasal spray Place 1 spray into both nostrils as needed for congestion. 03/22/14   Junius Finner, PA-C   BP 121/71  Pulse 75  Temp(Src) 98.1 F (36.7 C) (Oral)  Resp 16  Ht  (1.727 m)  Wt 206 lb (93.441 kg)  BMI 31.33 kg/m2  SpO2 96% Physical Exam  Nursing note and vitals reviewed. Constitutional: She is oriented to person, place, and time. She appears well-developed and well-nourished.  HENT:  Head: Normocephalic  and atraumatic.  Right Ear: External ear normal.  Left Ear: External ear normal.  Nose: Mucosal edema present.  Mouth/Throat: Uvula is midline, oropharynx is clear and moist and mucous membranes are normal.  Eyes: EOM are normal.  Neck: Normal range of motion.  Cardiovascular: Normal rate and regular rhythm.  Exam reveals no gallop and no friction rub.   No murmur heard. Pulmonary/Chest: Effort normal and breath sounds normal. No respiratory distress. She has no wheezes. She has no rales.  Musculoskeletal: Normal range of motion.  Neurological: She is alert and oriented  to person, place, and time.  Skin: Skin is warm and dry.  Psychiatric: She has a normal mood and affect. Her behavior is normal.    ED Course  Procedures (including critical care time) Labs Review Labs Reviewed - No data to display  Results for orders placed during the hospital encounter of 01/30/14  CBG MONITORING, ED      Result Value Ref Range   Glucose-Capillary 233 (*) 70 - 99 mg/dL   No results found.    Imaging Review Dg Chest 2 View (if Patient Has Fever And/or Copd)  03/22/2014   CLINICAL DATA:  URI COUGH  EXAM: CHEST  2 VIEW  COMPARISON:  Prior radiograph from 11/23/2013  FINDINGS: The cardiac and mediastinal silhouettes are stable in size and contour, and remain within normal limits.  The lungs are normally inflated. No airspace consolidation, pleural effusion, or pulmonary edema is identified. There is no pneumothorax.  No acute osseous abnormality identified.  IMPRESSION: No active cardiopulmonary disease.   Electronically Signed   By: Rise Mu M.D.   On: 03/22/2014 18:41     EKG Interpretation None     Medications  ibuprofen (ADVIL,MOTRIN) tablet 600 mg (600 mg Oral Given 03/22/14 1653)    4:30 PM- Treatment plan was discussed with patient who verbalizes understanding and agrees.   MDM   Final diagnoses:  Acute URI    Pt is a 38yo female presenting to ED with flu-like symptoms that started 3 days ago.  Has taken OTN sinus medication w/o relief.  Vitals: WNL, afebrile, no hypoxia. CXR: no active cardiopulmonary disease Will tx conservatively. Advised pt to use acetaminophen and ibuprofen as needed for fever and pain. Encouraged rest and fluids. Strongly encouraged pt to establish care with a PCP. Return precautions provided. Pt verbalized understanding and agreement with tx plan.   I personally performed the services described in this documentation, which was scribed in my presence. The recorded information has been reviewed and is  accurate.    Junius Finner, PA-C 03/22/14 2049

## 2014-03-22 NOTE — Discharge Instructions (Signed)
Take Ibuprofen (Motrin) every 6-8 hours for fever and pain  Alternate with Tylenol  Take Tylenol every 4-6 hours as needed for fever and pain  Follow-up with your primary care provider next week for recheck of symptoms if not improving.  Be sure to drink plenty of fluids and rest, at least 8hrs of sleep a night, preferably more while you are sick. Return to the ED if you cannot keep down fluids/signs of dehydration, fever not reducing with Tylenol, difficulty breathing/wheezing, stiff neck, worsening condition, or other concerns (see below)   Emergency Department Resource Guide 1) Find a Doctor and Pay Out of Pocket Although you won't have to find out who is covered by your insurance plan, it is a good idea to ask around and get recommendations. You will then need to call the office and see if the doctor you have chosen will accept you as a new patient and what types of options they offer for patients who are self-pay. Some doctors offer discounts or will set up payment plans for their patients who do not have insurance, but you will need to ask so you aren't surprised when you get to your appointment.  2) Contact Your Local Health Department Not all health departments have doctors that can see patients for sick visits, but many do, so it is worth a call to see if yours does. If you don't know where your local health department is, you can check in your phone book. The CDC also has a tool to help you locate your state's health department, and many state websites also have listings of all of their local health departments.  3) Find a Walk-in Clinic If your illness is not likely to be very severe or complicated, you may want to try a walk in clinic. These are popping up all over the country in pharmacies, drugstores, and shopping centers. They're usually staffed by nurse practitioners or physician assistants that have been trained to treat common illnesses and complaints. They're usually fairly quick  and inexpensive. However, if you have serious medical issues or chronic medical problems, these are probably not your best option.  No Primary Care Doctor: - Call Health Connect at  [phone] - they can help you locate a primary care doctor that  accepts your insurance, provides certain services, etc. - Physician Referral Service- [phone]  Chronic Pain Problems: Organization         Address  Phone   Notes  [facility]  [phone] Patients need to be referred by their primary care doctor.   Medication Assistance: Organization         Address  Phone   Notes  [facility] Medication [facility] [address] [place] [place], [place] [address] [phone] --Must be a resident of [facility] -- Must have NO insurance coverage whatsoever (no Medicaid/ Medicare, etc.) -- The pt. MUST have a primary care doctor that directs their care regularly and follows them in the community   [facility]  [phone]   [employer]  [phone]    Agencies that provide inexpensive medical care: Organization         Address  Phone                                                                            Notes  [facility]  [phone]   [facility]    [phone]   [facility] [address] [place], [place] [address] [phone]   [facility] [address] [place], [place] [phone]   Planned Parenthood    [phone]   [facility]    [phone]   Community Health and [facility]  [address], [place] Phone:  [phone], Fax:  [phone] Hours of Operation:  9 am - 6 pm, M-F.  Also accepts Medicaid/Medicare and self-pay.  [facility]  [address], [place] Phone: [phone], Fax: [phone]. Hours of Operation:  8:30 am - 5:30  pm, M-F.  Also accepts Medicaid and self-pay.  [facility] [place] [address], [place] Phone: [phone]   [facility] [address] Natasha Bence [place], [place] [phone], Ext. [phone] Mondays & Thursdays: 7-9 AM.  First 15 patients are seen on a first come, first serve basis.    Medicaid-accepting [facility] Providers:  Organization         Address                                                                       Phone                               Notes  [facility] [address], [place] [phone] Also accepts self-pay patients.  [facility] [address] Laurell Josephs [place], [place]  [phone]   [facility] [place] [address], [place] [phone]   [facility] [address] [place] [phone]   Renaye Rakers [address], [place]   [phone] Only accepts [place] Access [place] patients after they have their name applied to their card.   Self-Pay (no insurance) in [facility]:   Organization         Address                                                     Phone               Notes  Sickle Cell Patients, [facility] [address] [place], [place] [phone]   [facility] Urgent Care [address] [place], [place] [phone]   [facility] [place]  [address], [place] [phone]   [facility]/Dr. Osei-Bonsu  [address] [place] or [address], [place] [phone] Phone number for both [place] and [place] locations is the same.  Urgent Medical and [facility] [address], [place] [phone]   [facility] [address], [place] or [address] [phone]   [facility] [address] [place] [phone], phone; [phone], fax Sees patients 1st and 3rd Saturday of every month.  Must not qualify for public or private insurance (i.e. Medicaid, Medicare, [place] Health Choice, Veterans' Benefits)  Household income should be no more than 200% of the poverty level The clinic cannot treat you if you are pregnant or think you are pregnant  Sexually transmitted diseases are not treated at the clinic.    Dental Care: Organization         Address                                  Phone                       Notes  [facility] Department of [facility] [address] [place], [place] [phone] Accepts children up to age 70 who are enrolled in [place] or [place] Health Choice; pregnant women with a Medicaid card; and children who have applied for Medicaid or [place] Health Choice, but were declined, whose parents can pay a reduced fee at time of service.  [facility] Department of [facility] [place]  [address], [place] [phone] Accepts children up to age 29 who are enrolled in [place] or [place] Health Choice; pregnant women with a Medicaid card; and children who have applied for Medicaid or  Health Choice, but were declined, whose parents can pay a reduced fee at time of service.  [facility]  [address] [place], [place] [phone] Patients are seen by appointment only. Walk-ins are not accepted. [facility] will see patients 93 years of age and older. Monday - Tuesday (8am-5pm) Most Wednesdays (8:30-5pm) $30 per visit, cash only  [facility] Access [facility]  [address], [facility] [phone] Patients are seen by appointment only. Walk-ins are not accepted. [facility] will see patients 76 years of age and older. One Wednesday Evening (Monthly: Volunteer Based).  $30 per visit, cash only  [employer]  [phone] for adults; Children under age 57, call Graduate Pediatric Dentistry at  [phone]. Children aged 27-14, please call [phone] to request a pediatric application.  Dental services are provided in all areas of dental care including fillings, crowns and bridges, complete and partial dentures, implants, gum treatment, root canals, and extractions. Preventive care is also provided. Treatment is provided to both adults and children. Patients are selected via a lottery and there is often a waiting list.   [facility] [address], [place]  [phone] [email or web address]   [facility] [address] [place], [place] [phone], Ext. [phone] Second and Fourth Thursday of each month, opens at 6:30 AM; Clinic ends at 9 AM.  Patients are seen on a first-come first-served basis, and a limited number are seen during each clinic.   [facility]  [address] Ether Griffins [place], [place] [phone]   Eligibility Requirements You must have lived in [place], [place], or [place] counties for at least the last three months.   You cannot be eligible for state or federal sponsored [place], including [employer], [place], or [employer].   You generally cannot be eligible for healthcare insurance through your employer.    How to apply: Eligibility screenings are held every Tuesday and Wednesday afternoon from 1:00 pm until 4:00 pm. You do not need an appointment for the interview!  [facility] [address] [place], [place] [phone]   [facility] Health Department  [phone]   [facility] Health Department  [phone]   [facility] Health Department  [phone]    Behavioral Health Resources in the Community: Intensive Outpatient Programs Organization         Address                                              Phone              Notes  [facility] [address], [place], [place] [phone]   [facility] Outpatient [address], [place], [place] [phone]   [facility] [address], [place], [place]  [phone]   [facility] Mental Health [address]  [place], [place] [phone] or 9051264161   Substance Abuse Resources Organization         Address                                Phone  Notes  [facility]  [phone]   [facility]  [phone]   [facility] [place]  [phone]   Floydene Flock  [phone]   Residential & Outpatient Substance Abuse Program  [phone]   Psychological Services Organization         Address                                  Phone                Notes  [facility] Behavioral Health  [phone]   [facility]  [phone]   [facility] [address] [employer] [phone] or [phone]    Mobile Crisis Teams Organization         Address  Phone  Notes  [facility], Mobile Crisis Care Unit  8021169639   [facility]  [address] [place], [place] [phone]   Doristine Locks [address] [place] [phone]    Self-Help/Support Groups Organization         Address                         Phone             Notes  [facility] [place] - variety of support groups  336- I7437963 Call for more information  Narcotics Anonymous (NA), [facility] [address], [employer] [place]  2 meetings at this location   Statistician  Address                                                    Phone              Notes  [facility] [address],    [place]  [phone]   [facility]  [address] [place] [address], [place], [place] [phone]   [facility] Treatment Facility [address] [place], [place] [phone] Admissions: 8am-3pm M-F  [facility] [address],    [place], [place] [phone]   [facility] [address] [place], [place], [place] [phone]   [facility] [address]  [place], [place] [phone]   [facility] - Intensive Outpatient [address] Laurell Josephs [address], [place], [place] [phone]   [facility] (Addiction Recovery [facility] [address] [place]  [place], [place] [phone] or [phone]   [facility] ([facility]) [address], [place], [place] [phone] Accepts Medicaid  [facility] [address],  [place] [phone] Substance Abuse/Addiction Treatment   [facility] Organization         Address                                                            Phone                    Notes  [facility]  [phone]   Angie Fava, PhD [address] Ervin Knack [place], [place]   [phone] or [phone]   [facility] Behavioral   [address] [place], [place] [phone]   [facility] [address], [place], [place] [phone] Insurance/Medicaid/sponsorship through [facility] [address]                                    [place], [place] [phone] Therapy/tele-psych/case  [facility] [address], [place] [phone]    Dr. Lolly Mustache  [phone]   [facility] [place]  [employer] [facility] Dept. 1) [address] [place] 2) [address] [place] 3)  [address], [place] [phone]  [phone]   [facility] Child Abuse Hotline 850 637 7229 or [phone] (After Hours)

## 2014-03-23 NOTE — ED Provider Notes (Signed)
Medical screening examination/treatment/procedure(s) were performed by non-physician practitioner and as supervising physician I was immediately available for consultation/collaboration.   EKG Interpretation None       Raeford Razor, MD 03/23/14 1459

## 2014-05-11 ENCOUNTER — Emergency Department (HOSPITAL_COMMUNITY)
Admission: EM | Admit: 2014-05-11 | Discharge: 2014-05-11 | Disposition: A | Discharge status: Home / Self Care | Payer: Self-pay | Attending: Emergency Medicine | Admitting: Emergency Medicine

## 2014-05-11 ENCOUNTER — Encounter (HOSPITAL_COMMUNITY): Payer: Self-pay | Admitting: Family Medicine

## 2014-05-11 DIAGNOSIS — E119 Type 2 diabetes mellitus without complications: Secondary | ICD-10-CM | POA: Insufficient documentation

## 2014-05-11 DIAGNOSIS — Z79899 Other long term (current) drug therapy: Secondary | ICD-10-CM | POA: Insufficient documentation

## 2014-05-11 DIAGNOSIS — Z720 Tobacco use: Secondary | ICD-10-CM | POA: Insufficient documentation

## 2014-05-11 DIAGNOSIS — M5442 Lumbago with sciatica, left side: Secondary | ICD-10-CM | POA: Insufficient documentation

## 2014-05-11 MED ORDER — HYDROCODONE-ACETAMINOPHEN 5-325 MG PO TABS
1.0000 | ORAL_TABLET | ORAL | Status: DC | PRN
Start: 2014-05-11 — End: 2014-09-15

## 2014-05-11 MED ORDER — CYCLOBENZAPRINE HCL 10 MG PO TABS: 10.0000 mg | ORAL_TABLET | Freq: Two times a day (BID) | ORAL | Status: DC | PRN

## 2014-05-11 MED ORDER — HYDROCODONE-ACETAMINOPHEN 5-325 MG PO TABS
2.0000 | ORAL_TABLET | Freq: Once | ORAL | Status: AC
  Administered 2014-05-11: 2 via ORAL
  Filled 2014-05-11: qty 2

## 2014-05-11 NOTE — ED Notes (Signed)
Per pt sts lower back pain into tailbone. sts pain radiates from buttocks down left leg.

## 2014-05-11 NOTE — Discharge Instructions (Signed)
Read the information below.  Use the prescribed medication as directed.  Please discuss all new medications with your pharmacist.  Do not take additional tylenol while taking the prescribed pain medication to avoid overdose.  You may return to the Emergency Department at any time for worsening condition or any new symptoms that concern you.  If there is any possibility that you might be pregnant, please let your health care provider know and discuss this with the pharmacist to ensure medication safety.   If you develop fevers, loss of control of bowel or bladder, weakness or numbness in your legs, or are unable to walk, return to the ER for a recheck.     Back Pain, Adult Back pain is very common. The pain often gets better over time. The cause of back pain is usually not dangerous. Most people can learn to manage their back pain on their own.  HOME CARE   Stay active. Start with short walks on flat ground if you can. Try to walk farther each day.  Do not sit, drive, or stand in one place for more than 30 minutes. Do not stay in bed.  Do not avoid exercise or work. Activity can help your back heal faster.  Be careful when you bend or lift an object. Bend at your knees, keep the object close to you, and do not twist.  Sleep on a firm mattress. Lie on your side, and bend your knees. If you lie on your back, put a pillow under your knees.  Only take medicines as told by your doctor.  Put ice on the injured area.  Put ice in a plastic bag.  Place a towel between your skin and the bag.  Leave the ice on for 15-20 minutes, 03-04 times a day for the first 2 to 3 days. After that, you can switch between ice and heat packs.  Ask your doctor about back exercises or massage.  Avoid feeling anxious or stressed. Find good ways to deal with stress, such as exercise. GET HELP RIGHT AWAY IF:   Your pain does not go away with rest or medicine.  Your pain does not go away in 1 week.  You have new  problems.  You do not feel well.  The pain spreads into your legs.  You cannot control when you poop (bowel movement) or pee (urinate).  Your arms or legs feel weak or lose feeling (numbness).  You feel sick to your stomach (nauseous) or throw up (vomit).  You have belly (abdominal) pain.  You feel like you may pass out (faint). MAKE SURE YOU:   Understand these instructions.  Will watch your condition.  Will get help right away if you are not doing well or get worse. Document Released: 12/02/2007 Document Revised: 09/07/2011 Document Reviewed: 10/17/2013 [facility] Patient Information 2015 [place]. This information is not intended to replace advice given to you by your health care provider. Make sure you discuss any questions you have with your health care provider.

## 2014-05-11 NOTE — Discharge Planning (Signed)
Bianca Yang J. Clydene Laming, RN, [place], [place] [phone] ED CM Consulted to meet with patient concerning f/u care with PCP and patient does not have insurance. Pt presented to [facility] ED today with back pain.  Met with patient at bedside, confirmed informaton. Pt  reports not having access to f/u care with PCP, or insurance coverage. Discussed with patient importance and benefits of  establishing PCP, and not utilizing the ED for primary care needs. Pt verbalized understanding and is in agreement. Discussed other options, provided list of local  affordable PCPs.  Pt voiced  Interest in the [facility] and Veteran.  [facility] Brochure given with address, phone number, and the services highlighted. Explained that there is a Customer service manager on site who will assist with [employer] and process. Instructed to call or walk in Monday - Friday between the hours of 9- 5:30p to schedule appt for establishing care for PCP. Pt verbalized understanding.

## 2014-05-11 NOTE — ED Provider Notes (Signed)
CSN: [id number]     Arrival date & time 05/11/14  1234 History  This chart was scribed for non-physician practitioner, Trixie DredgeEmily Barclay Lennox, PA-C,working with Ward GivensIva L Knapp, MD, by Karle PlumberJennifer Tensley, ED Scribe. This patient was seen in room TR07C/TR07C and the patient's care was started at 2:09 PM.  Chief Complaint  Patient presents with  . Back Pain   Patient is a 38 y.o. female presenting with back pain. The history is provided by the patient. No language interpreter was used.  Back Pain Associated symptoms: no abdominal pain, no dysuria, no numbness and no weakness     HPI Comments:  Bianca Yang is a 38 y.o. female with PMH of DM who presents to the Emergency Department complaining of worsening severe lower back pain that began approximately one week ago. She states the pain starts in the middle of her lower back and intermittently radiates down the back of her left leg. She states she previously injured her back in the same area about 6 years ago. She reports walking, moving makes the pain worse. Denies alleviating factors. She states she has taken Ibuprofen, Naproxen, Tylenol and Percocet with minimal relief of the pain. Denies abdominal pain, dysuria, hematuria, vomiting, diarrhea, hematochezia, numbness, weakness or tingling of the lower extremities. Denies IV drug use or h/o cancer. Denies any trauma, injury or fall. Pt states she does not have a PCP.  Currently menstruating, menses on time and normal.  Denies urinary, vaginal, or bowel changes.  No bowel or bladder incontinence or retention.  Denies any falls or known injury.  Past Medical History  Diagnosis Date  . Diabetes mellitus without complication    Past Surgical History  Procedure Laterality Date  . Tubal ligation     History reviewed. No pertinent family history. History  Substance Use Topics  . Smoking status: Current Every Day Smoker -- 0.50 packs/day    Types: Cigarettes  . Smokeless tobacco: Not on file  . Alcohol Use: Yes      Comment: socially   OB History    No data available     Review of Systems  Gastrointestinal: Negative for nausea, vomiting, abdominal pain, diarrhea and blood in stool.  Genitourinary: Negative for dysuria and hematuria.  Musculoskeletal: Positive for back pain. Negative for gait problem.  Neurological: Negative for weakness and numbness.  All other systems reviewed and are negative.   Allergies  Sulfa antibiotics  Home Medications   Prior to Admission medications   Medication Sig Start Date End Date Taking? Authorizing Provider  benzonatate (TESSALON) 100 MG capsule Take 1 capsule (100 mg total) by mouth every 8 (eight) hours. 03/22/14   Junius FinnerErin O'Malley, PA-C  guaiFENesin (ROBITUSSIN) 100 MG/5ML liquid Take 5-10 mLs (100-200 mg total) by mouth every 4 (four) hours as needed for cough. 03/22/14   Junius FinnerErin O'Malley, PA-C  metFORMIN (GLUCOPHAGE) 500 MG tablet Take 1 tablet (500 mg total) by mouth 2 (two) times daily with a meal. 12/04/13   Hannah Muthersbaugh, PA-C  sodium chloride (OCEAN) 0.65 % SOLN nasal spray Place 1 spray into both nostrils as needed for congestion. 03/22/14   Junius FinnerErin O'Malley, PA-C   Triage Vitals: BP 118/69 mmHg  Pulse 75  Temp(Src) 97.8 F (36.6 C) (Oral)  Resp 20  Ht 5\' 8"  (1.727 m)  Wt 214 lb (97.07 kg)  BMI 32.55 kg/m2  SpO2 95%  LMP 05/11/2014 Physical Exam  Constitutional: She appears well-developed and well-nourished. No distress.  HENT:  Head: Normocephalic and atraumatic.  Neck: Neck supple.  Pulmonary/Chest: Effort normal.  Abdominal: Soft. She exhibits no distension. There is no tenderness. There is no rebound and no guarding.  Musculoskeletal: She exhibits tenderness. She exhibits no edema.  Tenderness over sacroiliac region. No bony tenderness of the spine. Spine nontender, no crepitus, or stepoffs. Lower extremities:  Strength 5/5, sensation intact, distal pulses intact.  Gait is normal.     Neurological: She is alert.  Skin: She is not  diaphoretic.  Nursing note and vitals reviewed.   ED Course  Procedures (including critical care time) DIAGNOSTIC STUDIES: Oxygen Saturation is 95% on RA, adequate by my interpretation.   COORDINATION OF CARE: 2:15 PM- Will have case manager speak with patient. Pt verbalizes understanding and agrees to plan.  Medications  HYDROcodone-acetaminophen (NORCO/VICODIN) 5-325 MG per tablet 2 tablet (2 tablets Oral Given 05/11/14 1306)    Labs Review Labs Reviewed - No data to display  Imaging Review No results found.   EKG Interpretation None      MDM   Final diagnoses:  Midline low back pain with left-sided sciatica    Afebrile, nontoxic patient with low back pain radiating down left posterior leg to level of the knee. No red flags by history or exam.  Neurovascularly intact.  No need for emergent imaging at this time.  Case manager has provided assistance in ensuring close PCP follow up.  Pt has all medications at home, no other needs at this time.  D/C home with flexeril, norco (#10), [place] and Wellness follow up.  Discussed result, findings, treatment, and follow up  with patient.  Pt given return precautions.  Pt verbalizes understanding and agrees with plan.      I personally performed the services described in this documentation, which was scribed in my presence. The recorded information has been reviewed and is accurate.    Trixie DredgeEmily Conley Pawling, PA-C 05/11/14 1640  Ward GivensIva L Knapp, MD 05/16/14 1447

## 2014-07-10 ENCOUNTER — Ambulatory Visit: Payer: Self-pay

## 2014-09-15 ENCOUNTER — Emergency Department (HOSPITAL_COMMUNITY): Payer: Self-pay

## 2014-09-15 ENCOUNTER — Emergency Department (HOSPITAL_COMMUNITY)
Admission: EM | Admit: 2014-09-15 | Discharge: 2014-09-15 | Disposition: A | Discharge status: Home / Self Care | Payer: Self-pay | Attending: Emergency Medicine | Admitting: Emergency Medicine

## 2014-09-15 ENCOUNTER — Encounter (HOSPITAL_COMMUNITY): Payer: Self-pay | Admitting: *Deleted

## 2014-09-15 DIAGNOSIS — E119 Type 2 diabetes mellitus without complications: Secondary | ICD-10-CM | POA: Insufficient documentation

## 2014-09-15 DIAGNOSIS — Z720 Tobacco use: Secondary | ICD-10-CM | POA: Insufficient documentation

## 2014-09-15 DIAGNOSIS — M79672 Pain in left foot: Secondary | ICD-10-CM | POA: Insufficient documentation

## 2014-09-15 DIAGNOSIS — Z79899 Other long term (current) drug therapy: Secondary | ICD-10-CM | POA: Insufficient documentation

## 2014-09-15 MED ORDER — HYDROCODONE-ACETAMINOPHEN 5-325 MG PO TABS: 1.0000 | ORAL_TABLET | Freq: Four times a day (QID) | ORAL | Status: DC | PRN

## 2014-09-15 MED ORDER — IBUPROFEN 800 MG PO TABS: 800.0000 mg | ORAL_TABLET | Freq: Three times a day (TID) | ORAL | Status: DC | PRN

## 2014-09-15 MED ORDER — IBUPROFEN 400 MG PO TABS
600.0000 mg | ORAL_TABLET | Freq: Once | ORAL | Status: AC
  Administered 2014-09-15: 600 mg via ORAL
  Filled 2014-09-15: qty 2

## 2014-09-15 MED ORDER — HYDROCODONE-ACETAMINOPHEN 5-325 MG PO TABS
1.0000 | ORAL_TABLET | Freq: Once | ORAL | Status: AC
  Administered 2014-09-15: 1 via ORAL
  Filled 2014-09-15: qty 1

## 2014-09-15 NOTE — Discharge Instructions (Signed)
Read the information below.  Use the prescribed medication as directed.  Please discuss all new medications with your pharmacist.  Do not take additional tylenol while taking the prescribed pain medication to avoid overdose.  You may return to the Emergency Department at any time for worsening condition or any new symptoms that concern you.   If there is any possibility that you might be pregnant, please let your health care provider know and discuss this with the pharmacist to ensure medication safety.   If you develop uncontrolled pain, weakness or numbness of the extremity, severe discoloration of the skin, or you are unable to move your foot or walk, return to the ER for a recheck.

## 2014-09-15 NOTE — ED Provider Notes (Signed)
CSN: [id number]     Arrival date & time 09/15/14  1000 History  This chart was scribed for non-physician practitioner, Trixie Dredge, PA-C working with Gwyneth Sprout, MD by Angelene Giovanni, ED Scribe. The patient was seen in room TR05C/TR05C and the patient's care was started at 10:39 AM    Chief Complaint  Patient presents with  . Foot Pain   The history is provided by the patient. No language interpreter was used.   HPI Comments: Bianca Yang is a 39 y.o. female who presents to the Emergency Department complaining of left foot pain onset last night. She reports associated pain when she bends her foot. She denies any known injury but had a 24 oz beer while she was cooking last night. She was wearing shoes yesterday that she has only worn 5 times in 2 years.  She denies any fever, CP, SOB, and swelling in legs. She denies pain in her left ankle. She is unsure of any injury to that foot. She denies taking any medication for the pain. She also denies any cold symptoms or abdominal pain. 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   Past Medical History  Diagnosis Date  . Diabetes mellitus without complication    Past Surgical History  Procedure Laterality Date  . Tubal ligation     History reviewed. No pertinent family history. History  Substance Use Topics  . Smoking status: Current Every Day Smoker -- 0.50 packs/day    Types: Cigarettes  . Smokeless tobacco: Not on file  . Alcohol Use: Yes     Comment: socially   OB History    No data available     Review of Systems  Constitutional: Negative for fever and chills.  HENT: Negative for congestion, ear pain and rhinorrhea.   Respiratory: Negative for shortness of breath.   Cardiovascular: Negative for chest pain.  Gastrointestinal: Negative for nausea, vomiting, abdominal pain and diarrhea.  Musculoskeletal: Negative for joint swelling. Arthralgias: left foot.       Allergies  Sulfa antibiotics  Home Medications   Prior to Admission medications   Medication Sig Start Date End Date Taking? Authorizing Provider  benzonatate (TESSALON) 100 MG capsule Take 1 capsule (100 mg total) by mouth every 8 (eight) hours. 03/22/14   Junius Finner, PA-C  cyclobenzaprine (FLEXERIL) 10 MG tablet Take 1 tablet (10 mg total) by mouth 2 (two) times daily as needed for muscle spasms. 05/11/14   Trixie Dredge, PA-C  guaiFENesin (ROBITUSSIN) 100 MG/5ML liquid Take 5-10 mLs (100-200 mg total) by mouth every 4 (four) hours as needed for cough. 03/22/14   Junius Finner, PA-C  HYDROcodone-acetaminophen (NORCO/VICODIN) 5-325 MG per tablet Take 1 tablet by mouth every 4 (four) hours as needed for moderate pain or severe pain. 05/11/14   Trixie Dredge, PA-C  metFORMIN (GLUCOPHAGE) 500 MG tablet Take 1 tablet (500 mg total) by mouth 2 (two) times daily with a meal. 12/04/13   Hannah Muthersbaugh, PA-C  sodium chloride (OCEAN) 0.65 % SOLN nasal spray Place 1 spray into both nostrils as needed for congestion. 03/22/14   Junius Finner, PA-C   BP 104/63 mmHg  Pulse 78  Temp(Src) 98.3 F (36.8 C) (Oral)  Resp 16  Ht  (1.727 m)  Wt 216 lb (97.977 kg)  BMI 32.85 kg/m2  SpO2 98%  LMP 08/28/2014 Physical Exam  Constitutional: She appears well-developed and well-nourished. No distress.  HENT:  Head:  Normocephalic and atraumatic.  Neck: Neck supple.  Pulmonary/Chest: Effort normal.  Musculoskeletal: She exhibits edema and tenderness.  Left foot with small area of edema and tenderness over the dorsal aspect of the foot over lining the 3rd, 4th, and 5th metatarsal  No erythema  Able to move all digits Distal pulses intact, capillary refill  No edema, erythema, warmth or tenderness of the left calf.   Neurological: She is alert.  Skin: She is not diaphoretic.  Nursing note and vitals reviewed.   ED Course  Procedures (including critical care time) DIAGNOSTIC STUDIES: Oxygen  Saturation is 98% on RA, normal by my interpretation.    COORDINATION OF CARE: 10:44 AM- Pt advised of plan for treatment and pt agrees.    Labs Review Labs Reviewed - No data to display  Imaging Review Dg Foot Complete Left  09/15/2014   CLINICAL DATA:  Acute lateral left foot pain and swelling.  EXAM: LEFT FOOT - COMPLETE 3+ VIEW  COMPARISON:  None.  FINDINGS: Normal alignment without acute fracture. Preserved joint spaces. No significant arthropathy. Minor swelling noted laterally over the fifth metatarsal. No radiopaque foreign body.  IMPRESSION: Lateral soft tissue swelling without acute osseous finding   Electronically Signed   By: Judie PetitM.  Shick M.D.   On: 09/15/2014 11:36     EKG Interpretation None      MDM   Final diagnoses:  Left foot pain    Afebrile, nontoxic patient with pain in her left dorsal foot without known injury.  Small area of edema and tenderness.  No erythema or warmth.  No clinical e/o cellulitis or septic joint.  Xray negative.   D/C home with ibuprofen, #6 norco, ace wrap, postop shoe.  Pt declined crutches, has some at home.  PCP follow up.   Discussed result, findings, treatment, and follow up  with patient.  Pt given return precautions.  Pt verbalizes understanding and agrees with plan.        I personally performed the services described in this documentation, which was scribed in my presence. The recorded information has been reviewed and is accurate.    Trixie Dredgemily Daunte Oestreich, PA-C 09/15/14 1227  Gwyneth SproutWhitney Plunkett, MD 09/15/14 1420

## 2014-09-15 NOTE — ED Notes (Signed)
Pt reports Lt lateral foot pain upon waking this AM.

## 2014-09-15 NOTE — ED Notes (Signed)
Declined W/C at D/C and was escorted to lobby by RN. 

## 2015-01-07 ENCOUNTER — Emergency Department (HOSPITAL_COMMUNITY)
Admission: EM | Admit: 2015-01-07 | Discharge: 2015-01-08 | Disposition: A | Discharge status: Home / Self Care | Payer: Self-pay | Attending: Emergency Medicine | Admitting: Emergency Medicine

## 2015-01-07 ENCOUNTER — Emergency Department (HOSPITAL_COMMUNITY): Payer: Self-pay

## 2015-01-07 ENCOUNTER — Encounter (HOSPITAL_COMMUNITY): Payer: Self-pay | Admitting: *Deleted

## 2015-01-07 DIAGNOSIS — R112 Nausea with vomiting, unspecified: Secondary | ICD-10-CM | POA: Insufficient documentation

## 2015-01-07 DIAGNOSIS — R197 Diarrhea, unspecified: Secondary | ICD-10-CM | POA: Insufficient documentation

## 2015-01-07 DIAGNOSIS — E119 Type 2 diabetes mellitus without complications: Secondary | ICD-10-CM | POA: Insufficient documentation

## 2015-01-07 DIAGNOSIS — Z720 Tobacco use: Secondary | ICD-10-CM | POA: Insufficient documentation

## 2015-01-07 DIAGNOSIS — R059 Cough, unspecified: Secondary | ICD-10-CM

## 2015-01-07 DIAGNOSIS — J069 Acute upper respiratory infection, unspecified: Secondary | ICD-10-CM | POA: Insufficient documentation

## 2015-01-07 DIAGNOSIS — Z79899 Other long term (current) drug therapy: Secondary | ICD-10-CM | POA: Insufficient documentation

## 2015-01-07 DIAGNOSIS — R05 Cough: Secondary | ICD-10-CM

## 2015-01-07 DIAGNOSIS — R52 Pain, unspecified: Secondary | ICD-10-CM | POA: Insufficient documentation

## 2015-01-07 LAB — URINALYSIS, ROUTINE W REFLEX MICROSCOPIC
BILIRUBIN URINE: NEGATIVE
Glucose, UA: >1000 mg/dL — AB
Hgb urine dipstick: NEGATIVE
Ketones, ur: NEGATIVE mg/dL
Nitrite: NEGATIVE
Protein, ur: 30 mg/dL — AB
Specific Gravity, Urine: 1.025 (ref 1.005–1.030)
Urobilinogen, UA: 0.2 mg/dL (ref 0.0–1.0)
pH: 5.5 (ref 5.0–8.0)

## 2015-01-07 LAB — URINE MICROSCOPIC-ADD ON

## 2015-01-07 LAB — COMPREHENSIVE METABOLIC PANEL
ALK PHOS: 80 U/L (ref 38–126)
ALT: 19 U/L (ref 14–54)
ANION GAP: 7 (ref 5–15)
AST: 25 U/L (ref 15–41)
Albumin: 3.9 g/dL (ref 3.5–5.0)
BUN: <5 mg/dL — ABNORMAL LOW (ref 6–20)
CO2: 31 mmol/L (ref 22–32)
Calcium: 9.1 mg/dL (ref 8.9–10.3)
Chloride: 96 mmol/L — ABNORMAL LOW (ref 101–111)
Creatinine, Ser: 0.52 mg/dL (ref 0.44–1.00)
GFR calc Af Amer: >60 mL/min (ref >60)
GFR calc non Af Amer: >60 mL/min (ref >60)
GLUCOSE: 240 mg/dL — AB (ref 65–99)
Potassium: 3.8 mmol/L (ref 3.5–5.1)
Sodium: 134 mmol/L — ABNORMAL LOW (ref 135–145)
Total Bilirubin: 0.4 mg/dL (ref 0.3–1.2)
Total Protein: 7.4 g/dL (ref 6.5–8.1)

## 2015-01-07 LAB — CBC
HCT: 45.3 % (ref 36.0–46.0)
HEMOGLOBIN: 14.7 g/dL (ref 12.0–15.0)
MCH: 29.6 pg (ref 26.0–34.0)
MCHC: 32.5 g/dL (ref 30.0–36.0)
MCV: 91.1 fL (ref 78.0–100.0)
PLATELETS: 181 10*3/uL (ref 150–400)
RBC: 4.97 MIL/uL (ref 3.87–5.11)
RDW: 13.1 % (ref 11.5–15.5)
WBC: 7.1 10*3/uL (ref 4.0–10.5)

## 2015-01-07 LAB — LIPASE, BLOOD: LIPASE: 20 U/L — AB (ref 22–51)

## 2015-01-07 MED ORDER — SODIUM CHLORIDE 0.9 % IV BOLUS (SEPSIS)
1000.0000 mL | Freq: Once | INTRAVENOUS | Status: AC
  Administered 2015-01-07: 1000 mL via INTRAVENOUS

## 2015-01-07 MED ORDER — OXYCODONE-ACETAMINOPHEN 5-325 MG PO TABS
1.0000 | ORAL_TABLET | Freq: Once | ORAL | Status: AC
  Administered 2015-01-07: 1 via ORAL

## 2015-01-07 MED ORDER — KETOROLAC TROMETHAMINE 30 MG/ML IJ SOLN
30.0000 mg | Freq: Once | INTRAMUSCULAR | Status: AC
  Administered 2015-01-07: 30 mg via INTRAVENOUS
  Filled 2015-01-07: qty 1

## 2015-01-07 MED ORDER — PREDNISONE 20 MG PO TABS
60.0000 mg | ORAL_TABLET | Freq: Once | ORAL | Status: AC
  Administered 2015-01-07: 60 mg via ORAL
  Filled 2015-01-07: qty 3

## 2015-01-07 MED ORDER — ALBUTEROL SULFATE (2.5 MG/3ML) 0.083% IN NEBU
5.0000 mg | INHALATION_SOLUTION | Freq: Once | RESPIRATORY_TRACT | Status: AC
  Administered 2015-01-07: 5 mg via RESPIRATORY_TRACT
  Filled 2015-01-07: qty 6

## 2015-01-07 MED ORDER — IPRATROPIUM BROMIDE 0.02 % IN SOLN
0.5000 mg | Freq: Once | RESPIRATORY_TRACT | Status: AC
  Administered 2015-01-07: 0.5 mg via RESPIRATORY_TRACT
  Filled 2015-01-07: qty 2.5

## 2015-01-07 MED ORDER — ONDANSETRON HCL 4 MG/2ML IJ SOLN
4.0000 mg | Freq: Once | INTRAMUSCULAR | Status: AC
  Administered 2015-01-07: 4 mg via INTRAVENOUS
  Filled 2015-01-07: qty 2

## 2015-01-07 MED ORDER — OXYCODONE-ACETAMINOPHEN 5-325 MG PO TABS
ORAL_TABLET | ORAL | Status: AC
  Filled 2015-01-07: qty 1

## 2015-01-07 NOTE — ED Notes (Signed)
Pt c/o cough, diarrhea, body aches, sinus congestion for a week. Pt reports taking OTC without relief.

## 2015-01-07 NOTE — ED Provider Notes (Signed)
CSN: [id number]     Arrival date & time 01/07/15  1815 History   First MD Initiated Contact with Patient 01/07/15 2000     Chief Complaint  Patient presents with  . Diarrhea  . Cough  . Generalized Body Aches  . Migraine     (Consider location/radiation/quality/duration/timing/severity/associated sxs/prior Treatment) HPI Comments: Patient presents today with complaints of diarrhea, nausea, vomiting, cough, and nasal congestion.  She reports that she has had diarrhea over the past 6 days.  She reports numerous episodes daily.  She denies any blood or mucus in her stool.  She reports that two days ago she had one episode of vomiting and had another episode yesterday.  No vomiting today.  No blood in her emesis.  She denies abdominal pain, urinary symptoms, fever, chills, or vaginal discharge.  She has been taking Pepto Bismol and Imodium.  No recent hospitalizations or foreign travel.  No known exposure to C Diff.    Patient also presents today with a productive cough for the past 3 days.  She states that she has been taking Mucinex without improvement.  She reports associated sinus pressure and nasal congestion.  She denies SOB or chest pain.  She does currently smoke 3-4 cigarettes daily.  Denies history of COPD or asthma.    The history is provided by the patient.    Past Medical History  Diagnosis Date  . Diabetes mellitus without complication    Past Surgical History  Procedure Laterality Date  . Tubal ligation     History reviewed. No pertinent family history. History  Substance Use Topics  . Smoking status: Current Every Day Smoker -- 0.50 packs/day    Types: Cigarettes  . Smokeless tobacco: Not on file  . Alcohol Use: Yes     Comment: socially   OB History    No data available     Review of Systems  All other systems reviewed and are negative.     Allergies  Sulfa antibiotics  Home Medications   Prior to Admission medications   Medication Sig Start Date End  Date Taking? Authorizing Provider  benzonatate (TESSALON) 100 MG capsule Take 1 capsule (100 mg total) by mouth every 8 (eight) hours. 03/22/14   Junius Finner, PA-C  cyclobenzaprine (FLEXERIL) 10 MG tablet Take 1 tablet (10 mg total) by mouth 2 (two) times daily as needed for muscle spasms. 05/11/14   Trixie Dredge, PA-C  guaiFENesin (ROBITUSSIN) 100 MG/5ML liquid Take 5-10 mLs (100-200 mg total) by mouth every 4 (four) hours as needed for cough. 03/22/14   Junius Finner, PA-C  HYDROcodone-acetaminophen (NORCO/VICODIN) 5-325 MG per tablet Take 1 tablet by mouth every 6 (six) hours as needed for severe pain. 09/15/14   Trixie Dredge, PA-C  ibuprofen (ADVIL,MOTRIN) 800 MG tablet Take 1 tablet (800 mg total) by mouth every 8 (eight) hours as needed for mild pain or moderate pain. 09/15/14   Trixie Dredge, PA-C  metFORMIN (GLUCOPHAGE) 500 MG tablet Take 1 tablet (500 mg total) by mouth 2 (two) times daily with a meal. 12/04/13   Hannah Muthersbaugh, PA-C  sodium chloride (OCEAN) 0.65 % SOLN nasal spray Place 1 spray into both nostrils as needed for congestion. 03/22/14   Junius Finner, PA-C   BP 124/73 mmHg  Pulse 75  Temp(Src) 98.1 F (36.7 C) (Oral)  Resp 16  Ht  (1.727 m)  Wt 205 lb (92.987 kg)  BMI 31.18 kg/m2  SpO2 96%  LMP 11/22/2014 (Approximate) Physical Exam  Constitutional:  She appears well-developed and well-nourished.  HENT:  Head: Normocephalic and atraumatic.  Mouth/Throat: Oropharynx is clear and moist.  Neck: Normal range of motion. Neck supple.  Cardiovascular: Normal rate, regular rhythm and normal heart sounds.   Pulmonary/Chest: Effort normal. No respiratory distress. She has wheezes. She has no rales.  Patient with frequent coughing during lung exam Mild diffuse wheezing  Abdominal: Soft. Bowel sounds are normal. She exhibits no distension and no mass. There is no tenderness. There is no rebound and no guarding.  Musculoskeletal: Normal range of motion.  Neurological: She is  alert.  Skin: Skin is warm and dry.  Psychiatric: She has a normal mood and affect.  Nursing note and vitals reviewed.   ED Course  Procedures (including critical care time) Labs Review Labs Reviewed  LIPASE, BLOOD - Abnormal; Notable for the following:    Lipase 20 (*)    All other components within normal limits  COMPREHENSIVE METABOLIC PANEL - Abnormal; Notable for the following:    Sodium 134 (*)    Chloride 96 (*)    Glucose, Bld 240 (*)    BUN <5 (*)    All other components within normal limits  CBC  URINALYSIS, ROUTINE W REFLEX MICROSCOPIC (NOT AT [facility])    Imaging Review Dg Chest 2 View  01/07/2015   CLINICAL DATA:  39 year old female with chest congestion and shortness of breath  EXAM: CHEST  2 VIEW  COMPARISON:  Chest radiograph 03/22/2014  FINDINGS: The heart size and mediastinal contours are within normal limits. Both lungs are clear. The visualized skeletal structures are unremarkable.  IMPRESSION: No active cardiopulmonary disease.   Electronically Signed   By: Elgie CollardArash  Radparvar M.D.   On: 01/07/2015 20:54     EKG Interpretation None     12:02 AM Reassessed patient.  Patient ate a sandwich and is tolerating PO liquids.  She reports mild improvement after second breathing treatment.  No hypoxia.  Lungs with slight wheezing at the bases.   MDM   Final diagnoses:  None   Patient presents today with several complaints.  She is complaining of headache, body aches, productive cough, nausea, vomiting, and diarrhea.  Patient is afebrile.  VSS.  Labs today are unremarkable.  CXR is negative.  No episodes of vomiting or diarrhea in the ED.  Patient eating a sandwich, crackers, and drinking fluids.  Lungs improved after given breathing treatments and Prednisone.  Feel that the patient is stable for discharge.  Return precautions given.    Santiago GladHeather Georgio Hattabaugh, PA-C 01/08/15 0037  Gerhard Munchobert Lockwood, MD 01/08/15 [phone]

## 2015-01-07 NOTE — ED Notes (Signed)
PA at bedside.

## 2015-01-08 MED ORDER — PREDNISONE 20 MG PO TABS: 60.0000 mg | ORAL_TABLET | Freq: Every day | ORAL | Status: DC

## 2015-01-08 MED ORDER — ONDANSETRON HCL 4 MG PO TABS: 4.0000 mg | ORAL_TABLET | Freq: Four times a day (QID) | ORAL | Status: DC

## 2015-01-08 MED ORDER — ALBUTEROL SULFATE HFA 108 (90 BASE) MCG/ACT IN AERS
2.0000 | INHALATION_SPRAY | RESPIRATORY_TRACT | Status: DC | PRN
  Administered 2015-01-08: 2 via RESPIRATORY_TRACT
  Filled 2015-01-08: qty 6.7

## 2015-01-08 MED ORDER — HYDROCODONE-HOMATROPINE 5-1.5 MG/5ML PO SYRP: 5.0000 mL | ORAL_SOLUTION | Freq: Four times a day (QID) | ORAL | Status: DC | PRN

## 2015-01-21 ENCOUNTER — Ambulatory Visit: Payer: Self-pay | Admitting: Internal Medicine

## 2015-07-10 ENCOUNTER — Emergency Department (HOSPITAL_COMMUNITY): Payer: Self-pay

## 2015-07-10 ENCOUNTER — Encounter (HOSPITAL_COMMUNITY): Payer: Self-pay | Admitting: *Deleted

## 2015-07-10 ENCOUNTER — Emergency Department (HOSPITAL_COMMUNITY)
Admission: EM | Admit: 2015-07-10 | Discharge: 2015-07-10 | Disposition: A | Discharge status: Home / Self Care | Payer: Self-pay | Attending: Emergency Medicine | Admitting: Emergency Medicine

## 2015-07-10 DIAGNOSIS — W1839XA Other fall on same level, initial encounter: Secondary | ICD-10-CM | POA: Insufficient documentation

## 2015-07-10 DIAGNOSIS — S3992XA Unspecified injury of lower back, initial encounter: Secondary | ICD-10-CM | POA: Insufficient documentation

## 2015-07-10 DIAGNOSIS — W19XXXA Unspecified fall, initial encounter: Secondary | ICD-10-CM

## 2015-07-10 DIAGNOSIS — Z79899 Other long term (current) drug therapy: Secondary | ICD-10-CM | POA: Insufficient documentation

## 2015-07-10 DIAGNOSIS — Y998 Other external cause status: Secondary | ICD-10-CM | POA: Insufficient documentation

## 2015-07-10 DIAGNOSIS — Y9289 Other specified places as the place of occurrence of the external cause: Secondary | ICD-10-CM | POA: Insufficient documentation

## 2015-07-10 DIAGNOSIS — Y9389 Activity, other specified: Secondary | ICD-10-CM | POA: Insufficient documentation

## 2015-07-10 DIAGNOSIS — F17210 Nicotine dependence, cigarettes, uncomplicated: Secondary | ICD-10-CM | POA: Insufficient documentation

## 2015-07-10 DIAGNOSIS — E119 Type 2 diabetes mellitus without complications: Secondary | ICD-10-CM | POA: Insufficient documentation

## 2015-07-10 DIAGNOSIS — Z7952 Long term (current) use of systemic steroids: Secondary | ICD-10-CM | POA: Insufficient documentation

## 2015-07-10 NOTE — Discharge Instructions (Signed)
Please read attached information. If you experience any new or worsening signs or symptoms please return to the emergency room for evaluation. Please follow-up with your primary care provider or specialist as discussed. Please use medication prescribed only as directed and discontinue taking if you have any concerning signs or symptoms.   °

## 2015-07-10 NOTE — ED Notes (Signed)
"  buttbone pain".   Patient states slipped on ice and fell directly on butt.

## 2015-07-10 NOTE — ED Provider Notes (Signed)
CSN: [id number]     Arrival date & time 07/10/15  1206 History  By signing my name below, I, Ronney LionSuzanne Le, attest that this documentation has been prepared under the direction and in the presence of Newell RubbermaidJeffrey Shamus Desantis, PA-C. Electronically Signed: Ronney LionSuzanne Le, ED Scribe. 07/10/2015. 1:58 PM.    Chief Complaint  Patient presents with  . Fall   The history is provided by the patient. No language interpreter was used.   HPI Comments: Bianca Yang is a 40 y.o. female with a history of DM, who presents to the Emergency Department S/P slipping and falling on ice yesterday morning, landing on her buttocks; she complains of sudden-onset, constant, 9/10 sacral pain since falling. She denies head injury or LOC. Patient states she has been able to ambulate but with increased pain in her buttocks. She denies extremity numbness or weakness, or bowel or bladder incontinence. Patient works at a job that requires frequently being on her feet.  Past Medical History  Diagnosis Date  . Diabetes mellitus without complication [facility])    Past Surgical History  Procedure Laterality Date  . Tubal ligation     History reviewed. No pertinent family history. Social History  Substance Use Topics  . Smoking status: Current Every Day Smoker -- 0.50 packs/day    Types: Cigarettes  . Smokeless tobacco: None  . Alcohol Use: Yes     Comment: socially   OB History    No data available     Review of Systems A complete 10 system review of systems was obtained and all systems are negative except as noted in the HPI and PMH.    Allergies  Sulfa antibiotics  Home Medications   Prior to Admission medications   Medication Sig Start Date End Date Taking? Authorizing Provider  benzonatate (TESSALON) 100 MG capsule Take 1 capsule (100 mg total) by mouth every 8 (eight) hours. 03/22/14   Junius FinnerErin O'Malley, PA-C  cyclobenzaprine (FLEXERIL) 10 MG tablet Take 1 tablet (10 mg total) by mouth 2 (two) times daily as needed for  muscle spasms. 05/11/14   Trixie DredgeEmily West, PA-C  guaiFENesin (ROBITUSSIN) 100 MG/5ML liquid Take 5-10 mLs (100-200 mg total) by mouth every 4 (four) hours as needed for cough. 03/22/14   Junius FinnerErin O'Malley, PA-C  HYDROcodone-acetaminophen (NORCO/VICODIN) 5-325 MG per tablet Take 1 tablet by mouth every 6 (six) hours as needed for severe pain. 09/15/14   Trixie DredgeEmily West, PA-C  HYDROcodone-homatropine [facility](HYCODAN) 5-1.5 MG/5ML syrup Take 5 mLs by mouth every 6 (six) hours as needed for cough. 01/08/15   Heather Laisure, PA-C  ibuprofen (ADVIL,MOTRIN) 800 MG tablet Take 1 tablet (800 mg total) by mouth every 8 (eight) hours as needed for mild pain or moderate pain. 09/15/14   Trixie DredgeEmily West, PA-C  metFORMIN (GLUCOPHAGE) 500 MG tablet Take 1 tablet (500 mg total) by mouth 2 (two) times daily with a meal. 12/04/13   Hannah Muthersbaugh, PA-C  ondansetron (ZOFRAN) 4 MG tablet Take 1 tablet (4 mg total) by mouth every 6 (six) hours. 01/08/15   Heather Laisure, PA-C  predniSONE (DELTASONE) 20 MG tablet Take 3 tablets (60 mg total) by mouth daily. 01/08/15   Heather Laisure, PA-C  sodium chloride (OCEAN) 0.65 % SOLN nasal spray Place 1 spray into both nostrils as needed for congestion. 03/22/14   Junius FinnerErin O'Malley, PA-C   BP 127/75 mmHg  Pulse 78  Temp(Src) 98.1 F (36.7 C) (Oral)  Resp 18  SpO2 95%  LMP 06/29/2015 Physical Exam  Constitutional: She is  oriented to person, place, and time. She appears well-developed and well-nourished. No distress.  HENT:  Head: Normocephalic and atraumatic.  Eyes: Conjunctivae and EOM are normal.  Neck: Normal range of motion. Neck supple. No tracheal deviation present.  Cardiovascular: Normal rate.   Pulmonary/Chest: Effort normal. No respiratory distress.  Musculoskeletal: Normal range of motion. She exhibits tenderness. She exhibits no edema.  No signs of trauma to coccyx or sacrum, but exquisitely tender to palpation in those areas. No C, T, or L spine tenderness to palpation. No obvious signs of  trauma, deformity, infection, step-offs. Lung expansion normal. No scoliosis or kyphosis. Bilateral lower extremity strength 5 out of 5, sensation grossly intact, patellar reflexes 2+, pedal pulses 2+, Refill less than 3 seconds.  Ambulates with increased pain  Neurological: She is alert and oriented to person, place, and time.  Sensation intact.   Skin: Skin is warm and dry. She is not diaphoretic.  Psychiatric: She has a normal mood and affect. Her behavior is normal. Judgment and thought content normal.  Nursing note and vitals reviewed.   ED Course  Procedures (including critical care time)  DIAGNOSTIC STUDIES: Oxygen Saturation is 95% on RA, normal by my interpretation.    COORDINATION OF CARE: 1:13 PM - Discussed treatment plan with pt at bedside which includes XR. If negative, will treat as contusion with ice and ibuprofen. If the XR is positive for fracture, will discuss new treatment plan then. Will give work note for about 2-3 days. Strict return precautions discussed. Pt verbalized understanding and agreed to plan.   Imaging Review Dg Sacrum/coccyx  07/10/2015  CLINICAL DATA:  Recent fall with buttock pain EXAM: SACRUM AND COCCYX - 2+ VIEW COMPARISON:  None. FINDINGS: Pelvic ring is intact. The sacral ala are intact. No acute fracture is seen. No soft tissue changes are noted. IMPRESSION: No acute abnormality noted. Electronically Signed   By: Alcide Clever M.D.   On: 07/10/2015 13:46   I have personally reviewed and evaluated these images and lab results as part of my medical decision-making.  MDM   Final diagnoses:  Fall, initial encounter    Labs:  Imaging: DG Sacrum/Coccyx- negative  Consults:  Therapeutics:  Discharge Meds:    Assessment/Plan:  Patient with sacral pain.  XR Sacrum/Coccyx negative for acute fracture. Likely contusion. No neurological deficits and normal neuro exam.  Patient can walk but states is painful.  No loss of bowel or bladder control.   No concern for cauda equina.  No fever, night sweats, weight loss, h/o cancer, IVDU.  RICE protocol and ibuprofen indicated and discussed with patient.     I personally performed the services described in this documentation, which was scribed in my presence. The recorded information has been reviewed and is accurate.    Eyvonne Mechanic, PA-C 07/10/15 1358  Arby Barrette, MD 07/11/15 1346

## 2015-07-10 NOTE — ED Notes (Signed)
Pt reports slipping on ice yesterday and now having pain to her buttock. Ambulatory at triage.

## 2015-09-15 ENCOUNTER — Emergency Department (HOSPITAL_COMMUNITY)
Admission: EM | Admit: 2015-09-15 | Discharge: 2015-09-16 | Disposition: A | Discharge status: Home / Self Care | Payer: Self-pay | Attending: Emergency Medicine | Admitting: Emergency Medicine

## 2015-09-15 ENCOUNTER — Emergency Department (HOSPITAL_COMMUNITY): Payer: Self-pay

## 2015-09-15 ENCOUNTER — Encounter (HOSPITAL_COMMUNITY): Payer: Self-pay | Admitting: Emergency Medicine

## 2015-09-15 DIAGNOSIS — Y9289 Other specified places as the place of occurrence of the external cause: Secondary | ICD-10-CM | POA: Insufficient documentation

## 2015-09-15 DIAGNOSIS — Y998 Other external cause status: Secondary | ICD-10-CM | POA: Insufficient documentation

## 2015-09-15 DIAGNOSIS — W540XXA Bitten by dog, initial encounter: Secondary | ICD-10-CM | POA: Insufficient documentation

## 2015-09-15 DIAGNOSIS — S61452A Open bite of left hand, initial encounter: Secondary | ICD-10-CM

## 2015-09-15 DIAGNOSIS — S81832A Puncture wound without foreign body, left lower leg, initial encounter: Secondary | ICD-10-CM | POA: Insufficient documentation

## 2015-09-15 DIAGNOSIS — S81852A Open bite, left lower leg, initial encounter: Secondary | ICD-10-CM

## 2015-09-15 DIAGNOSIS — E119 Type 2 diabetes mellitus without complications: Secondary | ICD-10-CM | POA: Insufficient documentation

## 2015-09-15 DIAGNOSIS — Z792 Long term (current) use of antibiotics: Secondary | ICD-10-CM | POA: Insufficient documentation

## 2015-09-15 DIAGNOSIS — Y9389 Activity, other specified: Secondary | ICD-10-CM | POA: Insufficient documentation

## 2015-09-15 DIAGNOSIS — Z23 Encounter for immunization: Secondary | ICD-10-CM | POA: Insufficient documentation

## 2015-09-15 DIAGNOSIS — S61213A Laceration without foreign body of left middle finger without damage to nail, initial encounter: Secondary | ICD-10-CM | POA: Insufficient documentation

## 2015-09-15 DIAGNOSIS — F17210 Nicotine dependence, cigarettes, uncomplicated: Secondary | ICD-10-CM | POA: Insufficient documentation

## 2015-09-15 MED ORDER — RABIES VACCINE, PCEC IM SUSR
1.0000 mL | Freq: Once | INTRAMUSCULAR | Status: AC
  Administered 2015-09-16: 1 mL via INTRAMUSCULAR
  Filled 2015-09-15: qty 1

## 2015-09-15 MED ORDER — LIDOCAINE HCL (PF) 1 % IJ SOLN
5.0000 mL | Freq: Once | INTRAMUSCULAR | Status: AC
  Administered 2015-09-15: 5 mL
  Filled 2015-09-15: qty 5

## 2015-09-15 MED ORDER — BUPIVACAINE HCL 0.25 % IJ SOLN
10.0000 mL | Freq: Once | INTRAMUSCULAR | Status: DC
  Filled 2015-09-15 (×2): qty 10

## 2015-09-15 MED ORDER — TETANUS-DIPHTH-ACELL PERTUSSIS 5-2.5-18.5 LF-MCG/0.5 IM SUSP
0.5000 mL | Freq: Once | INTRAMUSCULAR | Status: AC
  Administered 2015-09-15: 0.5 mL via INTRAMUSCULAR
  Filled 2015-09-15: qty 0.5

## 2015-09-15 MED ORDER — SODIUM CHLORIDE 0.9 % IV SOLN
3.0000 g | Freq: Once | INTRAVENOUS | Status: AC
  Administered 2015-09-16: 3 g via INTRAVENOUS
  Filled 2015-09-15: qty 3

## 2015-09-15 MED ORDER — RABIES IMMUNE GLOBULIN 150 UNIT/ML IM INJ
1800.0000 [IU] | INJECTION | Freq: Once | INTRAMUSCULAR | Status: AC
  Administered 2015-09-16: 1800 [IU] via INTRAMUSCULAR
  Filled 2015-09-15: qty 12

## 2015-09-15 MED ORDER — BUPIVACAINE-EPINEPHRINE (PF) 0.5% -1:200000 IJ SOLN
1.8000 mL | Freq: Once | INTRAMUSCULAR | Status: AC
  Administered 2015-09-15: 1.8 mL

## 2015-09-15 MED ORDER — DIAZEPAM 5 MG PO TABS
5.0000 mg | ORAL_TABLET | Freq: Once | ORAL | Status: AC
  Administered 2015-09-15: 5 mg via ORAL
  Filled 2015-09-15: qty 1

## 2015-09-15 MED ORDER — AMOXICILLIN-POT CLAVULANATE 875-125 MG PO TABS
1.0000 | ORAL_TABLET | Freq: Once | ORAL | Status: AC
  Administered 2015-09-15: 1 via ORAL
  Filled 2015-09-15: qty 1

## 2015-09-15 MED ORDER — BUPIVACAINE HCL (PF) 0.5 % IJ SOLN: 1.8000 mL | Freq: Once | INTRAMUSCULAR | Status: DC

## 2015-09-15 MED ORDER — OXYCODONE-ACETAMINOPHEN 5-325 MG PO TABS
1.0000 | ORAL_TABLET | Freq: Once | ORAL | Status: AC
  Administered 2015-09-15: 1 via ORAL
  Filled 2015-09-15: qty 1

## 2015-09-15 MED ORDER — AMOXICILLIN-POT CLAVULANATE 875-125 MG PO TABS: 1.0000 | ORAL_TABLET | Freq: Two times a day (BID) | ORAL | Status: DC

## 2015-09-15 MED ORDER — BUPIVACAINE HCL (PF) 0.25 % IJ SOLN: 10.0000 mL | Freq: Once | INTRAMUSCULAR | Status: DC

## 2015-09-15 MED ORDER — OXYCODONE-ACETAMINOPHEN 7.5-325 MG PO TABS: 1.0000 | ORAL_TABLET | ORAL | Status: DC | PRN

## 2015-09-15 NOTE — ED Notes (Signed)
Gramig at bedside.  

## 2015-09-15 NOTE — Progress Notes (Signed)
Patient ID: Colletta MarylandChristy R Keathley, female   DOB: 1976/03/12, 40 y.o.   MRN: [id number] See full dictation for operative note regarding left middle finger open fracture [id number] Diagnosis left middle finger open distal phalanx fracture with nailbed and nail plate injury Virginia Francisco M.D.

## 2015-09-15 NOTE — Consult Note (Signed)
Reason for Consult: Open left middle finger fracture secondary to dog bite Referring Physician: ER physician  MAHRUKH SEGUIN is an 40 y.o. female.  HPI: Patient is a 40 year old who presents with left middle finger open fracture after dog bite.  Patient does not know the dog. Patient is been given Unasyn at our request and has started the rabies vaccination.  Patient has an open middle finger fracture which I been asked to see and treat.  Patient also has left lower leg injuries treated by ER/or throw.  Patient is alert and oriented.  Past Medical History  Diagnosis Date  . Diabetes mellitus without complication [facility])     Past Surgical History  Procedure Laterality Date  . Tubal ligation      No family history on file.  Social History:  reports that she has been smoking Cigarettes.  She has been smoking about 0.50 packs per day. She does not have any smokeless tobacco history on file. She reports that she drinks alcohol. She reports that she does not use illicit drugs.  Allergies:  Allergies  Allergen Reactions  . Sulfa Antibiotics Other (See Comments)    Unknown/ childhood allergy.     Medications: I have reviewed the patient's current medications.  No results found for this or any previous visit (from the past 48 hour(s)).  Dg Finger Middle Left  09/15/2015  CLINICAL DATA:  Dog bite.  Diabetes. EXAM: LEFT MIDDLE FINGER 2+V COMPARISON:  None. FINDINGS: Comminuted fracture of the tuft of the distal phalanx middle finger noted with some extension into the distal shaft. 3 mm displacement. Laceration along the radial side of the finger tip. IMPRESSION: 1. Comminuted fracture at the tuft and distal shaft of the distal phalanx middle finger. Radial sided laceration along the finger tip. Electronically Signed   By: Gaylyn Rong M.D.   On: 09/15/2015 20:56    ROS Blood pressure 151/81, pulse 96, temperature 98.7 F (37.1 C), temperature source Oral, resp. rate 22,  height  (1.727 m), weight 96.616 kg (213 lb), last menstrual period 08/25/2015, SpO2 95 %. Physical Exam left middle finger open fracture with nail bed nail plate and bony phalanx fracture about the distal phalanx.  There is no signs of compartment syndrome in the hand. Elbow wrist and forearm are nontender. Shoulders are stable. She has normal pulse. She has intact refill but a near amputation to the left middle finger.  Patient has been Consented regarding operative procedure as noted in her op note (see op note). The patient is alert and oriented in no acute distress. The patient complains of pain in the affected upper extremity.  The patient is noted to have a normal HEENT exam. Lung fields show equal chest expansion and no shortness of breath. Abdomen exam is nontender without distention. Pelvis is stable and the neck and back are stable and nontender. Assessment/Plan: Open left middle finger fracture secondary to dog bite.  Patient was consent for irrigation debridement and repair of structures as necessary. She understands this and will proceed accordingly. Please see operative note dictated separately We are planning surgery for your upper extremity. The risk and benefits of surgery to include risk of bleeding, infection, anesthesia,  damage to normal structures and failure of the surgery to accomplish its intended goals of relieving symptoms and restoring function have been discussed in detail. With this in mind we plan to proceed. I have specifically discussed with the patient the pre-and postoperative regime and the dos and don'ts  and risk and benefits in great detail. Risk and benefits of surgery also include risk of dystrophy(CRPS), chronic nerve pain, failure of the healing process to go onto completion and other inherent risks of surgery The relavent the pathophysiology of the disease/injury process, as well as the alternatives for treatment and postoperative course of action has  been discussed in great detail with the patient who desires to proceed.  We will do everything in our power to help you (the patient) restore function to the upper extremity. It is a pleasure to see this patient today.  Karen ChafeGRAMIG III,Karlea Mckibbin M 09/15/2015, 11:29 PM

## 2015-09-15 NOTE — ED Notes (Addendum)
Pt in reporting that random dog attacked her while taking trash out. Pt has scratches all over body, 3 lacerations to L calf area, laceration to L middle finger that extends from posterior to anterior side where nail is.

## 2015-09-15 NOTE — ED Provider Notes (Signed)
CSN: [id number]     Arrival date & time 09/15/15  1921 History   First MD Initiated Contact with Patient 09/15/15 1944     Chief Complaint  Patient presents with  . Animal Bite     (Consider location/radiation/quality/duration/timing/severity/associated sxs/prior Treatment) Patient is a 40 y.o. female presenting with animal bite. The history is provided by the patient.  Animal Bite Contact animal:  Dog Location:  Hand and leg Hand injury location:  L fingers Leg injury location:  L lower leg Time since incident: just prior to arrival. Pain details:    Quality:  Unable to specify   Severity:  Severe   Timing:  Constant   Progression:  Worsening Incident location:  Home Provoked: unprovoked   Notifications:  Animal control Animal's rabies vaccination status:  Unknown Animal in possession: no   Tetanus status:  Unknown Relieved by:  None tried Worsened by:  Nothing tried Ineffective treatments:  None tried  Bianca Yang is a 40 y.o. female who presents to the ED with dog bites and scratches. She reports that she went outside to empty her trash and a dog appeared and attacked her. She had never seen the dog before. He bit her left middle finger and several places on her left lower leg. Patient complains of severe pain.   Past Medical History  Diagnosis Date  . Diabetes mellitus without complication [facility])    Past Surgical History  Procedure Laterality Date  . Tubal ligation     No family history on file. Social History  Substance Use Topics  . Smoking status: Current Every Day Smoker -- 0.50 packs/day    Types: Cigarettes  . Smokeless tobacco: None  . Alcohol Use: Yes     Comment: socially   OB History    No data available     Review of Systems  Musculoskeletal: Positive for arthralgias.       Dog bites  Skin: Positive for wound.  all other systems negative    Allergies  Sulfa antibiotics  Home Medications   Prior to Admission medications     Medication Sig Start Date End Date Taking? Authorizing Provider  ibuprofen (ADVIL,MOTRIN) 800 MG tablet Take 1 tablet (800 mg total) by mouth every 8 (eight) hours as needed for mild pain or moderate pain. Patient taking differently: Take 400-800 mg by mouth every 8 (eight) hours as needed for mild pain or moderate pain.  09/15/14  Yes Trixie Dredge, PA-C  predniSONE (DELTASONE) 20 MG tablet Take 3 tablets (60 mg total) by mouth daily. Patient taking differently: Take 20-40 mg by mouth See admin instructions. Take two tables for one week and 1 tablet for one week. 01/08/15  Yes Heather Laisure, PA-C  amoxicillin-clavulanate (AUGMENTIN) 875-125 MG tablet Take 1 tablet by mouth 2 (two) times daily. 09/15/15   Dayane Hillenburg Orlene Och, NP  oxyCODONE-acetaminophen (PERCOCET) 7.5-325 MG tablet Take 1 tablet by mouth every 4 (four) hours as needed for severe pain. 09/15/15   Xavia Kniskern Orlene Och, NP   BP 151/81 mmHg  Pulse 96  Temp(Src) 98.7 F (37.1 C) (Oral)  Resp 22  Ht  (1.727 m)  Wt 96.616 kg  BMI 32.39 kg/m2  SpO2 95%  LMP 08/25/2015 (Approximate) Physical Exam  Constitutional: She is oriented to person, place, and time. She appears well-developed and well-nourished.  HENT:  Head: Normocephalic.  Eyes: EOM are normal.  Neck: Normal range of motion. Neck supple.  Cardiovascular: Normal rate.   Pulmonary/Chest: Effort normal.  Musculoskeletal:       Left hand: She exhibits tenderness, bony tenderness, deformity and laceration.       Hands:      Legs: Left middle finger with gaping laceration that extends through the nail.  Left calf with 3 wounds, one is 1 cm puncture 2 is a 4 cm lac gaping and 3 is 3 cm gaping wound.  Neurological: She is alert and oriented to person, place, and time. No cranial nerve deficit.  Skin: Skin is warm and dry.  Psychiatric: She has a normal mood and affect. Her behavior is normal.  Nursing note and vitals reviewed.   ED Course  Procedures (including critical care  time) Augmentin 875 mg PO, Valium 5 mg PO, Rabies vaccine X-rays, pain management   2100 Digital block of left middle finger using lidocaine 1% and Sensorcaine 0.25% 3 ccs of each Soaked in Betadine and NSS and then changed to just NSS  Three Wounds to left calf lidocaine 1% 7 ccs total then irrigated with 1000 ccs of NSS Will wait to close after x-ray to make sure no bone involvement.   2210 Dr. Amanda PeaGramig her to see the patient to repair hand wound  Labs Review Labs Reviewed - No data to display  Imaging Review Dg Finger Middle Left  09/15/2015  CLINICAL DATA:  Dog bite.  Diabetes. EXAM: LEFT MIDDLE FINGER 2+V COMPARISON:  None. FINDINGS: Comminuted fracture of the tuft of the distal phalanx middle finger noted with some extension into the distal shaft. 3 mm displacement. Laceration along the radial side of the finger tip. IMPRESSION: 1. Comminuted fracture at the tuft and distal shaft of the distal phalanx middle finger. Radial sided laceration along the finger tip. Electronically Signed   By: Gaylyn RongWalter  Liebkemann M.D.   On: 09/15/2015 20:56   I have personally reviewed and evaluated these images as part of my medical decision-making.   MDM  40 y.o. female with dog bites to the left hand and left lower leg. Awaiting x-rays of the leg. [facility] 2300.  Final diagnoses:  Animal bite of left hand, initial encounter  Animal bite of left lower leg, initial encounter       [facility] Ayan Heffington, NP 09/15/15 2256  Vanetta MuldersScott Zackowski, MD 09/16/15 0110

## 2015-09-15 NOTE — ED Provider Notes (Signed)
  Physical Exam  BP 116/71 mmHg  Pulse 85  Temp(Src) 98.7 F (37.1 C) (Oral)  Resp 22  Ht 5\' 8"  (1.727 m)  Wt 96.616 kg  BMI 32.39 kg/m2  SpO2 93%  LMP 08/25/2015 (Approximate)  Physical Exam Skin: Two 5 cm lacerations to the left lateral calf and mid calf. Small 1 cm puncture wound to the left mid calf. Active bleeding noted. No surrounding erythema or streaking. ED Course  Procedures  Patient was signed out to me by Kerrie BuffaloHope Neese, NP.  Patient has dog bite to hand and leg. Dr. Amanda PeaGramig is at bedside for hand repair.  He recommeded 3g of unasyn now and again at 4am.   Xray of the leg is pending.  Dr. Amanda PeaGramig recommended 2000cc of irrigation and loose sutures of the leg until follow up with Dr. Shon BatonBrooks who is on call.  X-ray of the leg shows soft tissue swelling but no foreign body or bony abnormality. The wound was thoroughly irrigated with 2500 mL. I closely approximated the wound but did not fully close the wound secondary to animal bite and risk of infection.  Patient was given follow-up with Dr. Shon BatonBrooks for the leg wound. She was also given follow-up with Dr. Amanda PeaGramig in 1 week for the left hand wound. I thoroughly discussed return precautions with the patient as well as taking antibiotics regularly. She was prescribed Percocet. Patient was given a work note for 1 week for both of her jobs.  Return precautions, medications, second dose of rabies vaccine, and follow-up with both physicians was thoroughly discussed at bedside and patient agrees with plan. Dr. [name] ShaggyWickline is aware of the plan which includes the patient getting a second dose of antibiotics at 4 AM. She can be discharged afterwards.  LACERATION REPAIR Performed by: Catha GosselinPatel-Mills, Jermiyah Ricotta Authorized by: Catha GosselinPatel-Mills, Burkley Dech Consent: Verbal consent obtained. Risks and benefits: risks, benefits and alternatives were discussed Consent given by: patient Patient identity confirmed: provided demographic data Prepped and Draped in normal  sterile fashion Wound explored Laceration Location: left calf  Laceration Length: 5cm No Foreign Bodies seen or palpated Anesthesia: local infiltration Local anesthetic: lidocaine 1% without epinephrine Anesthetic total: 5 ml Irrigation method: syringe Amount of cleaning: standard Skin closure: 4-0 prolene Number of sutures: 5 Technique: Simple interupted Patient tolerance: Bleeding was controlled by pressure. Sutures were loosely placed. Patient tolerated the procedure well with no immediate complications. Bacitracin and Coban was applied.  LACERATION REPAIR Performed by: Catha GosselinPatel-Mills, Tangela Dolliver Authorized by: Catha GosselinPatel-Mills, Jatziry Wechter Consent: Verbal consent obtained. Risks and benefits: risks, benefits and alternatives were discussed Consent given by: patient Patient identity confirmed: provided demographic data Prepped and Draped in normal sterile fashion Wound explored Laceration Location: left medial calf Laceration Length: 5cm No Foreign Bodies seen or palpated Anesthesia: local infiltration Local anesthetic: lidocaine 1% without epinephrine Anesthetic total: 5 ml Irrigation method: syringe Amount of cleaning: standard Skin closure: 4-0 prolene Number of sutures: 2 Technique: simple interupted Patient tolerance: Bleeding was controlled by pressure. Sutures were loosely placed Patient tolerated the procedure well with no immediate complications. Bacitracin and Coban was applied.         Catha GosselinHanna Patel-Mills, PA-C 09/16/15 0139  Linwood DibblesJon Knapp, MD 09/16/15 [phone]

## 2015-09-16 MED ORDER — SODIUM CHLORIDE 0.9 % IV SOLN
3.0000 g | Freq: Once | INTRAVENOUS | Status: AC
  Administered 2015-09-16: 3 g via INTRAVENOUS
  Filled 2015-09-16: qty 3

## 2015-09-16 MED ORDER — MORPHINE SULFATE (PF) 4 MG/ML IV SOLN
4.0000 mg | Freq: Once | INTRAVENOUS | Status: AC
  Administered 2015-09-16: 4 mg via INTRAVENOUS
  Filled 2015-09-16: qty 1

## 2015-09-16 MED ORDER — MORPHINE SULFATE (PF) 4 MG/ML IV SOLN
INTRAVENOUS | Status: AC
  Filled 2015-09-16: qty 1

## 2015-09-16 NOTE — Discharge Instructions (Signed)
Animal Bite Animal bites can range from mild to serious. An animal bite can result in a scratch on the skin, a deep open cut, a puncture of the skin, a crush injury, or tearing away of the skin or a body part. A small bite from a house pet will usually not cause serious problems. However, some animal bites can become infected or injure a bone or other tissue.  Bites from certain animals can be more dangerous because of the risk of spreading rabies, which is a serious viral infection. This risk is higher with bites from stray animals or wild animals, such as raccoons, foxes, skunks, and bats. Dogs are responsible for most animal bites. Children are bitten more often than adults. SYMPTOMS  Common symptoms of an animal bite include:   Pain.   Bleeding.   Swelling.   Bruising.  DIAGNOSIS  This condition may be diagnosed based on a physical exam and medical history. Your health care provider will examine the wound and ask for details about the animal and how the bite happened. You may also have tests, such as:   Blood tests to check for infection or to determine if surgery is needed.  X-rays to check for damage to bones or joints.  Culture test. This uses a sample of fluid from the wound to check for infection. TREATMENT  Treatment varies depending on the location and type of animal bite and your medical history. Treatment may include:   Wound care. This often includes cleaning the wound, flushing the wound with saline solution, and applying a bandage (dressing). Sometimes, the wound is left open to heal because of the high risk of infection. However, in some cases, the wound may be closed with stitches (sutures), staples, skin glue, or adhesive strips.   Antibiotic medicine.   Tetanus shot.   Rabies treatment if the animal could have rabies.  In some cases, bites that have become infected may require IV antibiotics and surgical treatment in the hospital.  [place]  Follow instructions from your health care provider about how to take care of your wound. Make sure you:  Wash your hands with soap and water before you change your dressing. If soap and water are not available, use hand sanitizer.  Change your dressing as told by your health care provider.  Leave sutures, skin glue, or adhesive strips in place. These skin closures may need to be in place for 2 weeks or longer. If adhesive strip edges start to loosen and curl up, you may trim the loose edges. Do not remove adhesive strips completely unless your health care provider tells you to do that.  Check your wound every day for signs of infection. Watch for:   Increasing redness, swelling, or pain.   Fluid, blood, or pus.  General Instructions  Take or apply over-the-counter and prescription medicines only as told by your health care provider.   If you were prescribed an antibiotic, take or apply it as told by your health care provider. Do not stop using the antibiotic even if your condition improves.   Keep the injured area raised (elevated) above the level of your heart while you are sitting or lying down, if this is possible.   If directed, apply ice to the injured area.   Put ice in a plastic bag.   Place a towel between your skin and the bag.   Leave the ice on for 20 minutes, 2-3 times per day.  Keep all follow-up visits as told by your health care provider. This is important.  SEEK MEDICAL CARE IF:  You have increasing redness, swelling, or pain at the site of your wound.   You have a general feeling of sickness (malaise).   You feel nauseous or you vomit.   You have pain that does not get better.  SEEK IMMEDIATE MEDICAL CARE IF:  You have a red streak extending away from your wound.   You have fluid, blood, or pus coming from your wound.   You have a fever or chills.   You have trouble moving your injured area.   You  have numbness or tingling extending beyond the wound.   This information is not intended to replace advice given to you by your health care provider. Make sure you discuss any questions you have with your health care provider.   Document Released: 03/03/2011 Document Revised: 03/06/2015 Document Reviewed: 10/31/2014 Elsevier Interactive Patient Education 2016 Else[employer] Inc[employer]  Sutured Wound Care Sutures are stitches that can be used to close wounds. Taking care of your wound properly can help prevent pain and infection. It can also help your wound to heal more quickly. HOW TO CARE FOR YOUR SUTURED WOUND Wound Care  Keep the wound clean and dry.  If you were given a bandage (dressing), change it at least one time per day or as told by your doctor. You should also change it if it gets wet or dirty.  Keep the wound completely dry for the first 24 hours or as told by your doctor. After that time, you may shower or bathe. However, make sure that the wound is not soaked in water until the sutures have been removed.  Clean the wound one time each day or as told by your doctor.  Wash the wound with soap and water.  Rinse the wound with water to remove all soap.  Pat the wound dry with a clean towel. Do not rub the wound.  After cleaning the wound, put a thin layer of antibiotic ointment on it as told your doctor. This ointment:  Helps to prevent infection.  Keeps the bandage from sticking to the wound.  Have the sutures removed as told by your doctor. General Instructions  Take or apply medicines only as told by your doctor.  To help prevent scarring, make sure to cover your wound with sunscreen whenever you are outside after the sutures are removed and the wound is healed. Make sure to wear a sunscreen of at least 30 SPF.  If you were prescribed an antibiotic medicine or ointment, finish all of it even if you start to feel better.  Do not scratch or pick at the wound.  Keep all  follow-up visits as told by your doctor. This is important.  Check your wound every day for signs of infection. Watch for:  Redness, swelling, or pain.  Fluid, blood, or pus.  Raise (elevate) the injured area above the level of your heart while you are sitting or lying down, if possible.  Avoid stretching your wound.  Drink enough fluids to keep your pee (urine) clear or pale yellow. GET HELP IF:  You were given a tetanus shot and you have any of these where the needle went in:  Swelling.  Very bad pain.  Redness.  Bleeding.  You have a fever.  A wound that was closed breaks open.  You notice a bad smell coming from the wound.  You notice something coming  out of the wound, such as wood or glass.  Medicine does not help your pain.  You have any of these at the site of the wound.  More redness.  More swelling.  More pain.  You have any of these coming from the wound.  Fluid.  Blood.  Pus.  You notice a change in the color of your skin near the wound.  You need to change the bandage often due to fluid, blood, or pus coming from the wound.  You have a new rash.  You have numbness around the wound. GET HELP RIGHT AWAY IF:  You have very bad swelling around the wound.  Your pain suddenly gets worse and is very bad.  You have painful lumps near the wound or on skin that is anywhere on your body.  You have a red streak going away from the wound.  The wound is on your hand or foot and you cannot move a finger or toe like normal.  The wound is on your hand or foot and you notice that your fingers or toes look pale or bluish.   This information is not intended to replace advice given to you by your health care provider. Make sure you discuss any questions you have with your health care provider.   Document Released: 12/02/2007 Document Revised: 10/30/2014 Document Reviewed: 01/25/2013 Elsevier Interactive Patient Education [employer]

## 2015-09-16 NOTE — Op Note (Signed)
Bianca Yang, Bianca Yang             ACCOUNT NO.:  [id number]  MEDICAL RECORD NO.:  [id number]  LOCATION:  A04C                         FACILITY:  [facility]  PHYSICIAN:  Dionne Ano. Tijah Hane, M.D.DATE OF BIRTH:  Oct 16, 1975  DATE OF PROCEDURE: DATE OF DISCHARGE:                              OPERATIVE REPORT   PREOPERATIVE DIAGNOSES:  Open fracture left middle finger with exposed bone; injury to the nail plate, nail bed, and associated soft tissues.  POSTOPERATIVE DIAGNOSES:  Open fracture left middle finger with exposed bone; injury to the nail plate, nail bed, and associated soft tissues.  PROCEDURES: 1. Irrigation and debridement, open fracture of left middle finger     (excisional debridement with curette, knife blade, and scissor). 2. Open treatment distal phalanx fracture, left middle finger. 3. Nail plate removal, left middle finger. 4. Nail bed repair, complex in nature, left middle finger. 5. Repair of 2 cm laceration, left finger.  SURGEON:  Dionne Ano. Amanda Pea, M.D.  ASSISTANT:  None.  COMPLICATIONS:  None.  ANESTHESIA:  Lidocaine local block.  TOURNIQUET TIME:  Less than 30 minutes.  INDICATIONS:  This is a 40 year old female, who presents to the emergency room after a dog bite.  The dog attacked her.  The dog was not known to her and thus she will start rabies series.  I have counseled her in regard to this.  We have also placed her on Unasyn 3 g.  She has an open fracture of left middle finger that I have been asked to see and treat.  OPERATIVE PROCEDURE:  The patient was seen and examined.  Block was in excellent working fashion.  She had a sterile field was secured. Following this, I performed irrigation and debridement of skin, subcutaneous tissue, bone, nail bed, nail plate, tissue.  This was an excisional debridement with 3 separate Betadine scrubs.  I exposed the phalanx and scrubbed this aggressively.  Following this, I performed debridement with 3 L of  saline.  Curette, knife blade, and scissor were used for the debridement.  Following this, sterile field was resecured.  The patient then underwent nail plate removal.  This was done with Irven [place].  Following this, I exposed bone once again and performed debridement of the bone once again with a copious amounts of irrigation placed.  I was certainly overly aggressive with the irrigation and debridement procedure given the fact that this was an animal bite.  We have taken on and necessary precautions.  Following this, I performed open treatment of the phalanx fracture with setting technique followed by repair of the nail bed.  A 4.5 loupe magnification was used for nail bed repair with a fine chromic suture.  Following this, 2 cm laceration was repaired.  Thus, nail plate removal, nail bed repair, open treatment of the fracture, and I and D, as well as 2 cm repair was accomplished.  Tourniquet was deflated.  Hemostasis excellent.  Adaptic placed under the eponychial fold and the patient was dressed.  The patient had a finger splint and sterile dressing applied. Refill was excellent.  There were no complicating features.  I instructed the ER for Augmentin 875 mg 1 p.o. b.i.d. times 10 to 14  days.  We will give her additional 1 g of Unasyn after the first dose is complete and keep her in the ER for this purpose.  I will plan to see her back in the office in 7 to 10 days.  I know her mother and they are certainly aware of their followup plan of care.  The patient tolerated the procedure beautifully.  The patient also has a lower extremity wound about the left lower extremity, it attended and treated by Orthopedics and the ER staff.  All questions have been encouraged and answered.     Dionne Ano. Amanda Pea, M.D.     [facility]  D:  09/15/2015  T:  09/16/2015  Job:  [id number]

## 2015-09-16 NOTE — ED Notes (Signed)
Stressed the importance of following up with rabies shots. Patient appeared unconcerned & stated "I'll see what I can do."

## 2015-10-04 ENCOUNTER — Encounter (HOSPITAL_BASED_OUTPATIENT_CLINIC_OR_DEPARTMENT_OTHER): Payer: Self-pay | Attending: Surgery

## 2015-10-04 DIAGNOSIS — L97229 Non-pressure chronic ulcer of left calf with unspecified severity: Secondary | ICD-10-CM | POA: Insufficient documentation

## 2015-10-04 DIAGNOSIS — F17218 Nicotine dependence, cigarettes, with other nicotine-induced disorders: Secondary | ICD-10-CM | POA: Insufficient documentation

## 2015-10-04 DIAGNOSIS — E11622 Type 2 diabetes mellitus with other skin ulcer: Secondary | ICD-10-CM | POA: Insufficient documentation

## 2016-01-13 ENCOUNTER — Emergency Department (HOSPITAL_COMMUNITY): Payer: Self-pay

## 2016-01-13 ENCOUNTER — Inpatient Hospital Stay (HOSPITAL_COMMUNITY)
Admission: EM | Admit: 2016-01-13 | Discharge: 2016-01-18 | DRG: 871 | Disposition: A | Discharge status: Home / Self Care | Payer: Self-pay | Attending: Internal Medicine | Admitting: Internal Medicine

## 2016-01-13 ENCOUNTER — Encounter (HOSPITAL_COMMUNITY): Payer: Self-pay | Admitting: Emergency Medicine

## 2016-01-13 DIAGNOSIS — IMO0002 Reserved for concepts with insufficient information to code with codable children: Secondary | ICD-10-CM

## 2016-01-13 DIAGNOSIS — E669 Obesity, unspecified: Secondary | ICD-10-CM

## 2016-01-13 DIAGNOSIS — R0902 Hypoxemia: Secondary | ICD-10-CM | POA: Diagnosis present

## 2016-01-13 DIAGNOSIS — R0600 Dyspnea, unspecified: Secondary | ICD-10-CM

## 2016-01-13 DIAGNOSIS — E1165 Type 2 diabetes mellitus with hyperglycemia: Secondary | ICD-10-CM | POA: Diagnosis present

## 2016-01-13 DIAGNOSIS — E118 Type 2 diabetes mellitus with unspecified complications: Secondary | ICD-10-CM

## 2016-01-13 DIAGNOSIS — R109 Unspecified abdominal pain: Secondary | ICD-10-CM

## 2016-01-13 DIAGNOSIS — J9601 Acute respiratory failure with hypoxia: Secondary | ICD-10-CM | POA: Diagnosis present

## 2016-01-13 DIAGNOSIS — N390 Urinary tract infection, site not specified: Secondary | ICD-10-CM | POA: Diagnosis present

## 2016-01-13 DIAGNOSIS — N738 Other specified female pelvic inflammatory diseases: Secondary | ICD-10-CM | POA: Diagnosis present

## 2016-01-13 DIAGNOSIS — Z9851 Tubal ligation status: Secondary | ICD-10-CM

## 2016-01-13 DIAGNOSIS — N739 Female pelvic inflammatory disease, unspecified: Secondary | ICD-10-CM | POA: Insufficient documentation

## 2016-01-13 DIAGNOSIS — E1169 Type 2 diabetes mellitus with other specified complication: Secondary | ICD-10-CM

## 2016-01-13 DIAGNOSIS — Z87891 Personal history of nicotine dependence: Secondary | ICD-10-CM

## 2016-01-13 DIAGNOSIS — A419 Sepsis, unspecified organism: Principal | ICD-10-CM

## 2016-01-13 DIAGNOSIS — Z79899 Other long term (current) drug therapy: Secondary | ICD-10-CM

## 2016-01-13 DIAGNOSIS — D509 Iron deficiency anemia, unspecified: Secondary | ICD-10-CM | POA: Diagnosis present

## 2016-01-13 DIAGNOSIS — Z881 Allergy status to other antibiotic agents status: Secondary | ICD-10-CM

## 2016-01-13 DIAGNOSIS — N7093 Salpingitis and oophoritis, unspecified: Secondary | ICD-10-CM | POA: Diagnosis present

## 2016-01-13 DIAGNOSIS — R072 Precordial pain: Secondary | ICD-10-CM | POA: Diagnosis present

## 2016-01-13 DIAGNOSIS — R1110 Vomiting, unspecified: Secondary | ICD-10-CM | POA: Insufficient documentation

## 2016-01-13 LAB — URINALYSIS, ROUTINE W REFLEX MICROSCOPIC
GLUCOSE, UA: 250 mg/dL — AB
Hgb urine dipstick: NEGATIVE
NITRITE: POSITIVE — AB
PH: 6 (ref 5.0–8.0)
PROTEIN: 100 mg/dL — AB
Specific Gravity, Urine: 1.026 (ref 1.005–1.030)

## 2016-01-13 LAB — COMPREHENSIVE METABOLIC PANEL
ALT: 12 U/L — ABNORMAL LOW (ref 14–54)
ANION GAP: 10 (ref 5–15)
AST: 12 U/L — AB (ref 15–41)
Albumin: 3.3 g/dL — ABNORMAL LOW (ref 3.5–5.0)
Alkaline Phosphatase: 116 U/L (ref 38–126)
BILIRUBIN TOTAL: 0.8 mg/dL (ref 0.3–1.2)
BUN: 7 mg/dL (ref 6–20)
CHLORIDE: 87 mmol/L — AB (ref 101–111)
CO2: 32 mmol/L (ref 22–32)
Calcium: 8.9 mg/dL (ref 8.9–10.3)
Creatinine, Ser: 0.62 mg/dL (ref 0.44–1.00)
Glucose, Bld: 309 mg/dL — ABNORMAL HIGH (ref 65–99)
POTASSIUM: 3.7 mmol/L (ref 3.5–5.1)
Sodium: 129 mmol/L — ABNORMAL LOW (ref 135–145)
TOTAL PROTEIN: 7.2 g/dL (ref 6.5–8.1)

## 2016-01-13 LAB — WET PREP, GENITAL
SPERM: NONE SEEN
Trich, Wet Prep: NONE SEEN
YEAST WET PREP: NONE SEEN

## 2016-01-13 LAB — CBC
HEMATOCRIT: 42.8 % (ref 36.0–46.0)
HEMOGLOBIN: 13.7 g/dL (ref 12.0–15.0)
MCH: 28.5 pg (ref 26.0–34.0)
MCHC: 32 g/dL (ref 30.0–36.0)
MCV: 89.2 fL (ref 78.0–100.0)
Platelets: 305 10*3/uL (ref 150–400)
RBC: 4.8 MIL/uL (ref 3.87–5.11)
RDW: 13.3 % (ref 11.5–15.5)
WBC: 23.3 10*3/uL — AB (ref 4.0–10.5)

## 2016-01-13 LAB — URINE MICROSCOPIC-ADD ON

## 2016-01-13 LAB — I-STAT BETA HCG BLOOD, ED (MC, WL, AP ONLY): I-stat hCG, quantitative: 9.7 m[IU]/mL — ABNORMAL HIGH (ref <5)

## 2016-01-13 LAB — CBG MONITORING, ED: GLUCOSE-CAPILLARY: 287 mg/dL — AB (ref 65–99)

## 2016-01-13 LAB — HCG, SERUM, QUALITATIVE: Preg, Serum: NEGATIVE

## 2016-01-13 LAB — LIPASE, BLOOD: LIPASE: 16 U/L (ref 11–51)

## 2016-01-13 MED ORDER — DEXTROSE 5 % IV SOLN
2.0000 g | Freq: Once | INTRAVENOUS | Status: AC
  Administered 2016-01-14: 2 g via INTRAVENOUS
  Filled 2016-01-13: qty 2

## 2016-01-13 MED ORDER — SODIUM CHLORIDE 0.9 % IV BOLUS (SEPSIS)
1000.0000 mL | Freq: Once | INTRAVENOUS | Status: AC
  Administered 2016-01-13: 1000 mL via INTRAVENOUS

## 2016-01-13 MED ORDER — OXYCODONE-ACETAMINOPHEN 5-325 MG PO TABS
ORAL_TABLET | ORAL | Status: AC
  Filled 2016-01-13: qty 1

## 2016-01-13 MED ORDER — ONDANSETRON HCL 4 MG/2ML IJ SOLN
4.0000 mg | Freq: Once | INTRAMUSCULAR | Status: AC
  Administered 2016-01-13: 4 mg via INTRAVENOUS
  Filled 2016-01-13: qty 2

## 2016-01-13 MED ORDER — METRONIDAZOLE IN NACL 5-0.79 MG/ML-% IV SOLN
500.0000 mg | Freq: Once | INTRAVENOUS | Status: AC
  Administered 2016-01-14: 500 mg via INTRAVENOUS
  Filled 2016-01-13: qty 100

## 2016-01-13 MED ORDER — ONDANSETRON 4 MG PO TBDP
ORAL_TABLET | ORAL | Status: AC
Start: 2016-01-13 — End: 2016-01-14
  Filled 2016-01-13: qty 1

## 2016-01-13 MED ORDER — DOXYCYCLINE HYCLATE 100 MG IV SOLR
100.0000 mg | Freq: Once | INTRAVENOUS | Status: AC
  Administered 2016-01-14: 100 mg via INTRAVENOUS
  Filled 2016-01-13: qty 100

## 2016-01-13 MED ORDER — OXYCODONE-ACETAMINOPHEN 5-325 MG PO TABS
1.0000 | ORAL_TABLET | ORAL | Status: DC | PRN
  Administered 2016-01-13: 1 via ORAL

## 2016-01-13 MED ORDER — IOPAMIDOL (ISOVUE-300) INJECTION 61%
INTRAVENOUS | Status: AC
  Administered 2016-01-13: 100 mL
  Filled 2016-01-13: qty 100

## 2016-01-13 MED ORDER — ONDANSETRON 4 MG PO TBDP
4.0000 mg | ORAL_TABLET | Freq: Once | ORAL | Status: AC | PRN
  Administered 2016-01-13: 4 mg via ORAL

## 2016-01-13 MED ORDER — HYDROMORPHONE HCL 1 MG/ML IJ SOLN
1.0000 mg | Freq: Once | INTRAMUSCULAR | Status: AC
  Administered 2016-01-13: 1 mg via INTRAVENOUS
  Filled 2016-01-13: qty 1

## 2016-01-13 MED ORDER — METOCLOPRAMIDE HCL 5 MG/ML IJ SOLN
10.0000 mg | Freq: Once | INTRAMUSCULAR | Status: AC
  Administered 2016-01-13: 10 mg via INTRAVENOUS
  Filled 2016-01-13: qty 2

## 2016-01-13 MED ORDER — MORPHINE SULFATE (PF) 4 MG/ML IV SOLN
4.0000 mg | Freq: Once | INTRAVENOUS | Status: AC
  Administered 2016-01-13: 4 mg via INTRAVENOUS
  Filled 2016-01-13: qty 1

## 2016-01-13 NOTE — ED Notes (Signed)
Pt states for the last week she has had mid abd pain with groin and flank pain also. Pt also reports burning with urination.

## 2016-01-13 NOTE — Progress Notes (Signed)
Pt placed on O2 as needed for desaturation in the 70's with a perfect waveform pleth. RN aware. Pt is now stable at this time o2 sats are acceptable.

## 2016-01-13 NOTE — ED Provider Notes (Signed)
Medical screening examination/treatment/procedure(s) were conducted as a shared visit with non-physician practitioner(s) and myself.  I personally evaluated the patient during the encounter.   EKG Interpretation None       40 year old female who presents with one week of low abdominal pain and diarrhea with subjective fever and chills. No urinary complaints, abnormal vaginal bleeding or discharge. Afebrile on arrival, intermittent tachycardia but normotensive. Low abdominal tenderness on exam without peritoneal signs. Blood work concerning for leukocytosis of 23. UA also notable for potential urinary tract infection. CT abdomen pelvis obtained and shows evidence of a tubo-ovarian abscess. Does state STD exposure back in January 2017. We'll initiated on antibiotics and consult OB/GYN for [facility] for ongoing management.  Lavera Guiseana Duo Liu, MD 01/13/16 2256

## 2016-01-13 NOTE — ED Provider Notes (Signed)
CSN: [id number]     Arrival date & time 01/13/16  1318 History   First MD Initiated Contact with Patient 01/13/16 1848     Chief Complaint  Patient presents with  . Abdominal Pain  . Flank Pain     (Consider location/radiation/quality/duration/timing/severity/associated sxs/prior Treatment) HPI   Patient is a 40 year old female past medical history of diabetes who presents the ED with complaint of abdominal pain, onset 1.5 weeks. Patient reports having intermittent sharp pain to her lower abdomen over the past week. She notes her pain is worse with movement or coughing. She notes over the past few days she has had a diffuse bloating sensation to her abdomen. LMP 01/06/16, which she notes was normal. Patient endorses dysuria, nausea, nonbloody diarrhea. Denies fever, chills, headache, chest pain, shortness of breath, vomiting, blood in urine or stool, vaginal bleeding, vaginal discharge, flank pain. Patient states she has been taking ibuprofen at home intermittently with mild relief. Denies any known sick contacts. Endorses history of tubal ligation performed 16 years ago.  Past Medical History  Diagnosis Date  . Diabetes mellitus without complication [facility](HCC)    Past Surgical History  Procedure Laterality Date  . Tubal ligation     No family history on file. Social History  Substance Use Topics  . Smoking status: Current Every Day Smoker -- 0.50 packs/day    Types: Cigarettes  . Smokeless tobacco: None  . Alcohol Use: Yes     Comment: socially   OB History    No data available     Review of Systems  Gastrointestinal: Positive for nausea and abdominal pain.  Genitourinary: Positive for dysuria.  All other systems reviewed and are negative.     Allergies  Sulfa antibiotics  Home Medications   Prior to Admission medications   Medication Sig Start Date End Date Taking? Authorizing Provider  ibuprofen (ADVIL,MOTRIN) 200 MG tablet Take 400 mg by mouth every 6 (six) hours as  needed for moderate pain.   Yes Historical Provider, MD  amoxicillin-clavulanate (AUGMENTIN) 875-125 MG tablet Take 1 tablet by mouth 2 (two) times daily. 09/15/15   Hope Orlene OchM Neese, NP  ibuprofen (ADVIL,MOTRIN) 800 MG tablet Take 1 tablet (800 mg total) by mouth every 8 (eight) hours as needed for mild pain or moderate pain. Patient taking differently: Take 400-800 mg by mouth every 8 (eight) hours as needed for mild pain or moderate pain.  09/15/14   Trixie DredgeEmily West, PA-C  oxyCODONE-acetaminophen (PERCOCET) 7.5-325 MG tablet Take 1 tablet by mouth every 4 (four) hours as needed for severe pain. 09/15/15   Hope Orlene OchM Neese, NP  predniSONE (DELTASONE) 20 MG tablet Take 3 tablets (60 mg total) by mouth daily. Patient taking differently: Take 20-40 mg by mouth See admin instructions. Take two tables for one week and 1 tablet for one week. 01/08/15   Heather Laisure, PA-C   BP 115/74 mmHg  Pulse 94  Temp(Src) 98.6 F (37 C) (Oral)  Resp 12  Ht 5\' 5"  (1.651 m)  Wt 96.616 kg  BMI 35.44 kg/m2  SpO2 99%  LMP 01/06/2016 Physical Exam  Constitutional: She is oriented to person, place, and time. She appears well-developed and well-nourished. No distress.  HENT:  Head: Normocephalic and atraumatic.  Mouth/Throat: Oropharynx is clear and moist. No oropharyngeal exudate.  Eyes: Conjunctivae and EOM are normal. Right eye exhibits no discharge. Left eye exhibits no discharge. No scleral icterus.  Neck: Normal range of motion. Neck supple.  Cardiovascular: Normal rate, regular rhythm, normal  heart sounds and intact distal pulses.   Pulmonary/Chest: Effort normal and breath sounds normal. No respiratory distress. She has no wheezes. She has no rales. She exhibits no tenderness.  Abdominal: Soft. Bowel sounds are normal. She exhibits no distension and no mass. There is tenderness (Mild diffuse abdominal tenderness, worse in lower abdominal quadrants). There is no rebound and no guarding.  No CVA tenderness   Musculoskeletal: Normal range of motion. She exhibits no edema.  Neurological: She is alert and oriented to person, place, and time.  Skin: Skin is warm and dry. She is not diaphoretic.  Nursing note and vitals reviewed.  Pelvic exam: normal external genitalia, vulva, vagina, cervix, uterus and adnexa, VULVA: normal appearing vulva with no masses, tenderness or lesions, VAGINA: normal appearing vagina with normal color and discharge, no lesions, vaginal discharge - white and milky, CERVIX: cervical discharge present - white and milky, WET MOUNT done - results: clue cells, white blood cells, DNA probe for chlamydia and GC obtained, cervical motion tenderness present, UTERUS: uterus is normal size, shape, consistency and nontender, tenderness, ADNEXA: tenderness bilateral, exam chaperoned by female tech.   ED Course  Procedures (including critical care time) Labs Review Labs Reviewed  WET PREP, GENITAL - Abnormal; Notable for the following:    Clue Cells Wet Prep HPF POC PRESENT (*)    WBC, Wet Prep HPF POC MANY (*)    All other components within normal limits  COMPREHENSIVE METABOLIC PANEL - Abnormal; Notable for the following:    Sodium 129 (*)    Chloride 87 (*)    Glucose, Bld 309 (*)    Albumin 3.3 (*)    AST 12 (*)    ALT 12 (*)    All other components within normal limits  CBC - Abnormal; Notable for the following:    WBC 23.3 (*)    All other components within normal limits  URINALYSIS, ROUTINE W REFLEX MICROSCOPIC (NOT AT [facility]) - Abnormal; Notable for the following:    Color, Urine ORANGE (*)    APPearance CLOUDY (*)    Glucose, UA 250 (*)    Bilirubin Urine LARGE (*)    Ketones, ur >80 (*)    Protein, ur 100 (*)    Nitrite POSITIVE (*)    Leukocytes, UA SMALL (*)    All other components within normal limits  URINE MICROSCOPIC-ADD ON - Abnormal; Notable for the following:    Squamous Epithelial / LPF 6-30 (*)    Bacteria, UA FEW (*)    Casts HYALINE CASTS (*)    All  other components within normal limits  I-STAT BETA HCG BLOOD, ED ([facility], [facility], [facility] ONLY) - Abnormal; Notable for the following:    I-stat hCG, quantitative 9.7 (*)    All other components within normal limits  CBG MONITORING, ED - Abnormal; Notable for the following:    Glucose-Capillary 287 (*)    All other components within normal limits  LIPASE, BLOOD  HCG, SERUM, QUALITATIVE  RPR  HIV ANTIBODY (ROUTINE TESTING)  GC/CHLAMYDIA PROBE AMP ([place]) NOT AT [facility]    Imaging Review Ct Abdomen Pelvis W Contrast  01/13/2016  CLINICAL DATA:  Lower abdominal tenderness for 1 week. Nausea. History of tubal ligation. EXAM: CT ABDOMEN AND PELVIS WITH CONTRAST TECHNIQUE: Multidetector CT imaging of the abdomen and pelvis was performed using the standard protocol following bolus administration of intravenous contrast. CONTRAST:  ISOVUE-300 IOPAMIDOL (ISOVUE-300) INJECTION 61% COMPARISON:  None. FINDINGS: Lower chest and abdominal wall:  No contributory findings. Hepatobiliary: Probable hepatic steatosis. No focal finding.No evidence of biliary obstruction or stone. Pancreas: Unremarkable. Spleen: Unremarkable. Adrenals/Urinary Tract: Bilateral low-density adrenal nodules, 14 mm on the left and up to 2 cm on the right. Smaller denser nodule present on the right measures 16 mm. No hydronephrosis or stone. Unremarkable bladder. Stomach/Bowel:  No obstruction. No appendicitis. Reproductive:Pelvic fat inflammation and peritoneal thickening with bilateral adnexal rim enhancing collections. The 2 right-sided cystic collections measure up to 46 mm and 27 mm. Left-sided collections measure up to 38 mm with this larger collection appearing intra-ovarian. No typical tubular fluid collection for hydrosalpinx or pyosalpinx. Unremarkable appearance of the uterus. Patient has positive istat HCG exam but subsequent negative laboratory result. Anticipate follow-up BHCG in this patient with tubal ligation. Vascular/Lymphatic:  No acute vascular abnormality. No mass or adenopathy. Other: No ascites or pneumoperitoneum. Musculoskeletal: No acute abnormalities. IMPRESSION: 1. Pelvic inflammation with bilateral rim enhancing adnexal collections consistent with PID and tubo-ovarian abscess. The larger is on the right at 5 cm. Please correlate with pelvic exam and quantitative beta HCG. 2. Bilateral adrenal nodules, in the absence of malignancy history statistically adenomas. Electronically Signed   By: Marnee Spring M.D.   On: 01/13/2016 22:17   I have personally reviewed and evaluated these images and lab results as part of my medical decision-making.   EKG Interpretation None      MDM   Final diagnoses:  PID (pelvic inflammatory disease)  Tubo-ovarian abscess    Patient presents with lower abdominal pain with associated nausea and dysuria for the past week and a half. Denies fever. Denies vaginal bleeding or vaginal discharge. VSS. Exam revealed diffuse abdominal tenderness, worse in lower quadrants, no peritoneal signs. Pelvic exam revealed vaginal discharge, CMT and bilateral adnexal tenderness. Patient given IV fluids, pain meds and Zofran. Pregnancy negative. WBC 23.3. UA consistent with UTI. Blood prep positive for clue cells. CT abdomen revealed PID with tubo-ovarian abscess. Consultative OB/GYN. Dr. Charlann Boxer advised to start pt on IV cefoxitin, doxycycline and flagyl and admit to hospitalist. He advised that he would be able to evaluate the pt during her admission as neeeded. Consulted hospitalist. Dr. Katrinka Blazing agrees to admission. Orders placed for MedSurg bed. Discussed results and plan for admission with patient.   Satira Sark [place], [place] 01/14/16 0015  Lavera Guise, MD 01/14/16 [phone]

## 2016-01-14 ENCOUNTER — Inpatient Hospital Stay (HOSPITAL_COMMUNITY): Payer: Self-pay

## 2016-01-14 DIAGNOSIS — A419 Sepsis, unspecified organism: Secondary | ICD-10-CM

## 2016-01-14 DIAGNOSIS — E118 Type 2 diabetes mellitus with unspecified complications: Secondary | ICD-10-CM

## 2016-01-14 DIAGNOSIS — E669 Obesity, unspecified: Secondary | ICD-10-CM

## 2016-01-14 DIAGNOSIS — IMO0002 Reserved for concepts with insufficient information to code with codable children: Secondary | ICD-10-CM

## 2016-01-14 DIAGNOSIS — E1169 Type 2 diabetes mellitus with other specified complication: Secondary | ICD-10-CM

## 2016-01-14 DIAGNOSIS — R0902 Hypoxemia: Secondary | ICD-10-CM | POA: Diagnosis present

## 2016-01-14 LAB — LACTIC ACID, PLASMA
Lactic Acid, Venous: 0.7 mmol/L (ref 0.5–1.9)
Lactic Acid, Venous: 0.9 mmol/L (ref 0.5–1.9)

## 2016-01-14 LAB — GC/CHLAMYDIA PROBE AMP (~~LOC~~) NOT AT ARMC
CHLAMYDIA, DNA PROBE: POSITIVE — AB
Neisseria Gonorrhea: NEGATIVE

## 2016-01-14 LAB — CBC
HCT: 38.8 % (ref 36.0–46.0)
HEMOGLOBIN: 12.1 g/dL (ref 12.0–15.0)
MCH: 28.2 pg (ref 26.0–34.0)
MCHC: 31.2 g/dL (ref 30.0–36.0)
MCV: 90.4 fL (ref 78.0–100.0)
Platelets: 278 10*3/uL (ref 150–400)
RBC: 4.29 MIL/uL (ref 3.87–5.11)
RDW: 13.6 % (ref 11.5–15.5)
WBC: 19.4 10*3/uL — ABNORMAL HIGH (ref 4.0–10.5)

## 2016-01-14 LAB — BASIC METABOLIC PANEL
Anion gap: 9 (ref 5–15)
CHLORIDE: 89 mmol/L — AB (ref 101–111)
CO2: 34 mmol/L — ABNORMAL HIGH (ref 22–32)
CREATININE: 0.63 mg/dL (ref 0.44–1.00)
Calcium: 8 mg/dL — ABNORMAL LOW (ref 8.9–10.3)
GFR calc Af Amer: >60 mL/min (ref >60)
GFR calc non Af Amer: >60 mL/min (ref >60)
GLUCOSE: 317 mg/dL — AB (ref 65–99)
Potassium: 3.3 mmol/L — ABNORMAL LOW (ref 3.5–5.1)
SODIUM: 132 mmol/L — AB (ref 135–145)

## 2016-01-14 LAB — GLUCOSE, CAPILLARY
GLUCOSE-CAPILLARY: 228 mg/dL — AB (ref 65–99)
GLUCOSE-CAPILLARY: 260 mg/dL — AB (ref 65–99)
GLUCOSE-CAPILLARY: 227 mg/dL — AB (ref 65–99)
GLUCOSE-CAPILLARY: 194 mg/dL — AB (ref 65–99)
Glucose-Capillary: 356 mg/dL — ABNORMAL HIGH (ref 65–99)

## 2016-01-14 LAB — HIV ANTIBODY (ROUTINE TESTING W REFLEX): HIV Screen 4th Generation wRfx: NONREACTIVE

## 2016-01-14 LAB — D-DIMER, QUANTITATIVE (NOT AT ARMC): D DIMER QUANT: 3.74 ug{FEU}/mL — AB (ref 0.00–0.50)

## 2016-01-14 LAB — RPR: RPR Ser Ql: NONREACTIVE

## 2016-01-14 MED ORDER — INSULIN DETEMIR 100 UNIT/ML ~~LOC~~ SOLN
20.0000 [IU] | Freq: Every day | SUBCUTANEOUS | Status: DC
  Administered 2016-01-14 – 2016-01-16 (×3): 20 [IU] via SUBCUTANEOUS
  Filled 2016-01-14 (×4): qty 0.2

## 2016-01-14 MED ORDER — OXYCODONE-ACETAMINOPHEN 7.5-325 MG PO TABS
1.0000 | ORAL_TABLET | ORAL | Status: DC | PRN
  Administered 2016-01-14 – 2016-01-17 (×8): 1 via ORAL
  Filled 2016-01-14 (×9): qty 1

## 2016-01-14 MED ORDER — MORPHINE SULFATE (PF) 2 MG/ML IV SOLN
1.0000 mg | INTRAVENOUS | Status: DC | PRN
  Administered 2016-01-15: 1 mg via INTRAVENOUS
  Filled 2016-01-14: qty 1

## 2016-01-14 MED ORDER — OXYCODONE-ACETAMINOPHEN 5-325 MG PO TABS: 1.0000 | ORAL_TABLET | Freq: Four times a day (QID) | ORAL | Status: DC | PRN

## 2016-01-14 MED ORDER — ONDANSETRON HCL 4 MG PO TABS: 4.0000 mg | ORAL_TABLET | Freq: Four times a day (QID) | ORAL | Status: DC | PRN

## 2016-01-14 MED ORDER — ALBUTEROL SULFATE (2.5 MG/3ML) 0.083% IN NEBU: 2.5000 mg | INHALATION_SOLUTION | RESPIRATORY_TRACT | Status: DC | PRN

## 2016-01-14 MED ORDER — SODIUM CHLORIDE 0.9 % IV SOLN
INTRAVENOUS | Status: DC
  Administered 2016-01-14 – 2016-01-17 (×3): via INTRAVENOUS

## 2016-01-14 MED ORDER — SODIUM CHLORIDE 0.9% FLUSH
3.0000 mL | Freq: Two times a day (BID) | INTRAVENOUS | Status: DC
  Administered 2016-01-16 – 2016-01-18 (×2): 3 mL via INTRAVENOUS

## 2016-01-14 MED ORDER — ACETAMINOPHEN 650 MG RE SUPP: 650.0000 mg | Freq: Four times a day (QID) | RECTAL | Status: DC | PRN

## 2016-01-14 MED ORDER — POTASSIUM CHLORIDE CRYS ER 20 MEQ PO TBCR
40.0000 meq | EXTENDED_RELEASE_TABLET | Freq: Once | ORAL | Status: AC
  Administered 2016-01-14: 40 meq via ORAL
  Filled 2016-01-14: qty 2

## 2016-01-14 MED ORDER — METRONIDAZOLE IN NACL 5-0.79 MG/ML-% IV SOLN
500.0000 mg | Freq: Two times a day (BID) | INTRAVENOUS | Status: DC
  Administered 2016-01-14: 500 mg via INTRAVENOUS
  Filled 2016-01-14 (×2): qty 100

## 2016-01-14 MED ORDER — DEXTROSE 5 % IV SOLN
2.0000 g | Freq: Four times a day (QID) | INTRAVENOUS | Status: DC
  Administered 2016-01-14 – 2016-01-18 (×16): 2 g via INTRAVENOUS
  Filled 2016-01-14 (×21): qty 2

## 2016-01-14 MED ORDER — POTASSIUM CHLORIDE CRYS ER 20 MEQ PO TBCR
40.0000 meq | EXTENDED_RELEASE_TABLET | ORAL | Status: AC
  Administered 2016-01-14: 40 meq via ORAL
  Filled 2016-01-14: qty 2

## 2016-01-14 MED ORDER — PNEUMOCOCCAL VAC POLYVALENT 25 MCG/0.5ML IJ INJ
0.5000 mL | INJECTION | INTRAMUSCULAR | Status: AC
  Administered 2016-01-15: 0.5 mL via INTRAMUSCULAR
  Filled 2016-01-14: qty 0.5

## 2016-01-14 MED ORDER — INSULIN DETEMIR 100 UNIT/ML ~~LOC~~ SOLN
5.0000 [IU] | Freq: Every day | SUBCUTANEOUS | Status: DC
  Administered 2016-01-14: 5 [IU] via SUBCUTANEOUS
  Filled 2016-01-14 (×2): qty 0.05

## 2016-01-14 MED ORDER — INSULIN ASPART 100 UNIT/ML ~~LOC~~ SOLN
0.0000 [IU] | Freq: Every day | SUBCUTANEOUS | Status: DC
  Administered 2016-01-14: 5 [IU] via SUBCUTANEOUS
  Administered 2016-01-15 – 2016-01-17 (×3): 2 [IU] via SUBCUTANEOUS

## 2016-01-14 MED ORDER — INSULIN ASPART 100 UNIT/ML ~~LOC~~ SOLN
0.0000 [IU] | Freq: Three times a day (TID) | SUBCUTANEOUS | Status: DC
  Administered 2016-01-14: 5 [IU] via SUBCUTANEOUS
  Administered 2016-01-14: 3 [IU] via SUBCUTANEOUS

## 2016-01-14 MED ORDER — ENOXAPARIN SODIUM 40 MG/0.4ML ~~LOC~~ SOLN
40.0000 mg | SUBCUTANEOUS | Status: DC
  Administered 2016-01-14 – 2016-01-18 (×5): 40 mg via SUBCUTANEOUS
  Filled 2016-01-14 (×5): qty 0.4

## 2016-01-14 MED ORDER — CEFOXITIN SODIUM 2 G IV SOLR
2.0000 g | Freq: Two times a day (BID) | INTRAVENOUS | Status: DC
Start: 2016-01-14 — End: 2016-01-14

## 2016-01-14 MED ORDER — INSULIN ASPART 100 UNIT/ML ~~LOC~~ SOLN
0.0000 [IU] | Freq: Three times a day (TID) | SUBCUTANEOUS | Status: DC
Start: 2016-01-14 — End: 2016-01-18
  Administered 2016-01-14 – 2016-01-15 (×3): 5 [IU] via SUBCUTANEOUS
  Administered 2016-01-15: 3 [IU] via SUBCUTANEOUS
  Administered 2016-01-16 (×2): 5 [IU] via SUBCUTANEOUS
  Administered 2016-01-16: 3 [IU] via SUBCUTANEOUS
  Administered 2016-01-17: 2 [IU] via SUBCUTANEOUS
  Administered 2016-01-17: 8 [IU] via SUBCUTANEOUS
  Administered 2016-01-17 – 2016-01-18 (×2): 5 [IU] via SUBCUTANEOUS

## 2016-01-14 MED ORDER — ONDANSETRON HCL 4 MG/2ML IJ SOLN
4.0000 mg | Freq: Four times a day (QID) | INTRAMUSCULAR | Status: DC | PRN
  Administered 2016-01-14 – 2016-01-16 (×7): 4 mg via INTRAVENOUS
  Filled 2016-01-14 (×8): qty 2

## 2016-01-14 MED ORDER — DEXTROSE 5 % IV SOLN
100.0000 mg | Freq: Two times a day (BID) | INTRAVENOUS | Status: DC
  Administered 2016-01-14 – 2016-01-18 (×9): 100 mg via INTRAVENOUS
  Filled 2016-01-14 (×10): qty 100

## 2016-01-14 MED ORDER — ACETAMINOPHEN 325 MG PO TABS
650.0000 mg | ORAL_TABLET | Freq: Four times a day (QID) | ORAL | Status: DC | PRN
  Administered 2016-01-14: 325 mg via ORAL
  Administered 2016-01-15: 650 mg via ORAL
  Filled 2016-01-14 (×2): qty 2

## 2016-01-14 MED ORDER — IOPAMIDOL (ISOVUE-370) INJECTION 76%
INTRAVENOUS | Status: AC
  Administered 2016-01-14: 100 mL
  Filled 2016-01-14: qty 100

## 2016-01-14 MED ORDER — METRONIDAZOLE IN NACL 5-0.79 MG/ML-% IV SOLN
500.0000 mg | Freq: Three times a day (TID) | INTRAVENOUS | Status: DC
  Administered 2016-01-14 – 2016-01-18 (×12): 500 mg via INTRAVENOUS
  Filled 2016-01-14 (×13): qty 100

## 2016-01-14 NOTE — Progress Notes (Signed)
Subjective: Patient admitted this morning, see dictated H&P by Dr. Katrinka BlazingSmith 40 y.o. female with medical history significant of diabetes mellitus type 2; who presents with complaints of lower abdominal pain over the last 1-1/2 weeks. Reports a sharp consistent pain that waxes and wanes in intensity over her lower abdomen and feels like is coming from her uterus and ovaries. She did try to take ibuprofen with only minimal relief of symptoms. States her last sexual encounter was some time before New Year's. Symptoms worsened with coughing and movement. Reports associated symptoms of dysuria, diarrhea, nausea. Denies having any fever, chills, headache, chest pain, vomiting, blood in stool/urine, vaginal discharge, or flank pain. Patient has never had symptoms like this previously in the past.  ED Course: Lab work revealed WBC 23.3, Sodium 129, potasium 3.7, chloride 87, and glucose 309 Wet prep was positive for clue cells and many WBCs. CT scan of the abdomen and pelvis showed pelvic inflammation with bilateral rim-enhancing adnexal collections consistent with PID and tubo-ovarian abscess the larger of which was on the right and 5 cm. OB/GYN was called and recommended admission to medicine with IV antibiotics. They recommended considering need of IR consultation if symptoms are not improved. Filed Vitals:   01/14/16 0800 01/14/16 1307  BP: 121/63 117/63  Pulse: 96 88  Temp: 100.8 F (38.2 C) 99 F (37.2 C)  Resp: 18 18    Chest: Clear Bilaterally Heart : S1S2 RRR Abdomen: Soft, nontender Ext : No edema Neuro: Alert, oriented x 3 Is A/P Pelvic inflammatory disease- patient came with lower abdominal pain, CT scan abdomen showed tubo-ovarian abscess. OB/GYN consulted in the ED and recommended IV antibiotics and INR consolidation if symptoms are not improved with IV antibiotics. Follow GC and chlamydia PCR, blood cultures. Continue antibiotics of cefoxitin, doxycycline, and metronidazole  Diabetes  mellitus Please a dose of Levemir to 20 units subcutaneous daily Change sliding scale to moderate  Hypoxia  patient had O2 sats of 80% on room air, d-dimer was elevated, CTA chest is negative for pulmonary embolism   Meredeth IdeGagan S Dashanae Longfield [facility] [phone]- [phone]

## 2016-01-14 NOTE — Progress Notes (Signed)
Inpatient Diabetes Program Recommendations  AACE/ADA: New Consensus Statement on Inpatient Glycemic Control (2015)  Target Ranges:  Prepandial:   less than 140 mg/dL      Peak postprandial:   less than 180 mg/dL (1-2 hours)      Critically ill patients:  140 - 180 mg/dL   Results for Colletta MarylandSTANLEY, Kamaree R (MRN [id number]) as of 01/14/2016 10:45  Ref. Range 01/13/2016 21:38 01/14/2016 02:36 01/14/2016 08:24  Glucose-Capillary Latest Ref Range: 65-99 mg/dL 086287 (H) 578356 (H) 469228 (H)    Review of Glycemic Control  Diabetes history: DM 2 Outpatient Diabetes medications: No home DM meds Current orders for Inpatient glycemic control: Levemir 5 units qd + Novolog correction 0-9 units tid + 0-5 units hs  Inpatient Diabetes Program Recommendations:  Noted pending A1c and elevated CBGs. Please consider increase in Levemir to 20 units qd and increase Novolog correction to 0-15 units tid along with current hs correction.  Thank you, Billy FischerJudy E. Glenna Brunkow, RN, MSN, CDE Inpatient Glycemic Control Team Team Pager [phone] (8am-5pm) 01/14/2016 10:49 AM

## 2016-01-14 NOTE — Progress Notes (Signed)
Text paged results of D-Dimer,lactic acid and CXR to Dr. Katrinka BlazingSmith.

## 2016-01-14 NOTE — Progress Notes (Signed)
Late entry- Patient admitted from emergency room to 6N23, alert,oriented x4. Febrile and tachycardic on arrival to floor and O2 sats 68-72% on room air.  Pt placed on 4 liters 02 [place] which she was on while in emergency dept.  Pt did not appear to be in distress and denied shortness of breath.  She did state that it hurt to take a deep breath and complained of right shoulder pain. Bilateral breath sounds diminished. Dr. Katrinka BlazingSmith notified. Orders placed in chart.

## 2016-01-14 NOTE — H&P (Addendum)
History and Physical    Bianca Yang YNW:[id number] DOB: 1976-04-03 DOA: 01/13/2016  Referring MD/NP/PA: Melburn Hake PCP: [name] Per Patient  Patient coming from: Home   Chief Complaint: Abdominal pain  HPI: Bianca Yang is a 40 y.o. female with medical history significant of diabetes mellitus type 2; who presents with complaints of lower abdominal pain over the last 1-1/2 weeks. Reports a sharp consistent pain that waxes and wanes in intensity over her lower abdomen and feels like is coming from her uterus and ovaries. She did try to take ibuprofen with only minimal relief of symptoms. States her last sexual encounter was some time before New Year's. Symptoms worsened with coughing and movement. Reports associated symptoms of dysuria, diarrhea, nausea. Denies having any fever, chills, headache, chest pain, vomiting, blood in stool/urine, vaginal discharge, or flank pain. Patient has never had symptoms like this previously in the past.  ED Course: Lab work revealed WBC 23.3, Sodium 129, potasium 3.7, chloride 87, and glucose 309 Wet prep was positive for clue cells and many WBCs. CT scan of the abdomen and pelvis showed pelvic inflammation with bilateral rim-enhancing adnexal collections consistent with PID and tubo-ovarian abscess the larger of which was on the right and 5 cm. OB/GYN was called and recommended admission to medicine with IV antibiotics. They recommended considering need of IR consultation if symptoms are not improved.  Review of Systems: As per HPI otherwise 10 point review of systems negative.   Past Medical History  Diagnosis Date  . Diabetes mellitus without complication [facility])     Past Surgical History  Procedure Laterality Date  . Tubal ligation       reports that she has been smoking Cigarettes.  She has been smoking about 0.50 packs per day. She does not have any smokeless tobacco history on file. She reports that she drinks alcohol. She reports that she  does not use illicit drugs.  Allergies  Allergen Reactions  . Sulfa Antibiotics Other (See Comments)    Unknown/ childhood allergy.     No family history on file.  Prior to Admission medications   Medication Sig Start Date End Date Taking? Authorizing Provider  ibuprofen (ADVIL,MOTRIN) 200 MG tablet Take 400 mg by mouth every 6 (six) hours as needed for moderate pain.   Yes Historical Provider, MD  amoxicillin-clavulanate (AUGMENTIN) 875-125 MG tablet Take 1 tablet by mouth 2 (two) times daily. 09/15/15   Hope Orlene Och, NP  ibuprofen (ADVIL,MOTRIN) 800 MG tablet Take 1 tablet (800 mg total) by mouth every 8 (eight) hours as needed for mild pain or moderate pain. Patient taking differently: Take 400-800 mg by mouth every 8 (eight) hours as needed for mild pain or moderate pain.  09/15/14   Trixie Dredge, PA-C  oxyCODONE-acetaminophen (PERCOCET) 7.5-325 MG tablet Take 1 tablet by mouth every 4 (four) hours as needed for severe pain. 09/15/15   Hope Orlene Och, NP  predniSONE (DELTASONE) 20 MG tablet Take 3 tablets (60 mg total) by mouth daily. Patient taking differently: Take 20-40 mg by mouth See admin instructions. Take two tables for one week and 1 tablet for one week. 01/08/15   Santiago Glad, PA-C    Physical Exam:    Constitutional:milddle age female who appears sick, but is able to give history and follow commands. Filed Vitals:   01/13/16 2206 01/13/16 2207 01/13/16 2208 01/13/16 2209  BP:      Pulse: 96 89 95 94  Temp:  TempSrc:      Resp: 13 14 15 12   Height:      Weight:      SpO2: 100% 100% 99% 99%   Eyes: PERRL, lids and conjunctivae normal ENMT: Mucous membranes are moist. Posterior pharynx clear of any exudate or lesions.Normal dentition.  Neck: normal, supple, no masses, no thyromegaly Respiratory: clear to auscultation bilaterally, no wheezing, no crackles. Normal respiratory effort. No accessory muscle use.  Cardiovascular: Regular rate and rhythm, no murmurs /  rubs / gallops. No extremity edema. 2+ pedal pulses. No carotid bruits.  Abdomen: tenderness to palpation of the lower abdomen, no masses palpated. No hepatosplenomegaly. Bowel sounds positive.  Musculoskeletal: no clubbing / cyanosis. No joint deformity upper and lower extremities. Good ROM, no contractures. Normal muscle tone.  Skin: no rashes, lesions, ulcers. No induration Neurologic: CN 2-12 grossly intact. Sensation intact, DTR normal. Strength 5/5 in all 4.  Psychiatric: Normal judgment and insight. Alert and oriented x 3. Normal mood.     Labs on Admission: I have personally reviewed following labs and imaging studies  CBC:  Recent Labs Lab 01/13/16 1403  WBC 23.3*  HGB 13.7  HCT 42.8  MCV 89.2  PLT 305   Basic Metabolic Panel:  Recent Labs Lab 01/13/16 1403  NA 129*  K 3.7  CL 87*  CO2 32  GLUCOSE 309*  BUN 7  CREATININE 0.62  CALCIUM 8.9   GFR: Estimated Creatinine Clearance: 107.4 mL/min (by C-G formula based on Cr of 0.62). Liver Function Tests:  Recent Labs Lab 01/13/16 1403  AST 12*  ALT 12*  ALKPHOS 116  BILITOT 0.8  PROT 7.2  ALBUMIN 3.3*    Recent Labs Lab 01/13/16 1403  LIPASE 16   No results for input(s): AMMONIA in the last 168 hours. Coagulation Profile: No results for input(s): INR, PROTIME in the last 168 hours. Cardiac Enzymes: No results for input(s): CKTOTAL, CKMB, CKMBINDEX, TROPONINI in the last 168 hours. BNP (last 3 results) No results for input(s): PROBNP in the last 8760 hours. HbA1C: No results for input(s): HGBA1C in the last 72 hours. CBG:  Recent Labs Lab 01/13/16 2138  GLUCAP 287*   Lipid Profile: No results for input(s): CHOL, HDL, LDLCALC, TRIG, CHOLHDL, LDLDIRECT in the last 72 hours. Thyroid Function Tests: No results for input(s): TSH, T4TOTAL, FREET4, T3FREE, THYROIDAB in the last 72 hours. Anemia Panel: No results for input(s): VITAMINB12, FOLATE, FERRITIN, TIBC, IRON, RETICCTPCT in the last 72  hours. Urine analysis:    Component Value Date/Time   COLORURINE ORANGE* 01/13/2016 1404   APPEARANCEUR CLOUDY* 01/13/2016 1404   LABSPEC 1.026 01/13/2016 1404   PHURINE 6.0 01/13/2016 1404   GLUCOSEU 250* 01/13/2016 1404   HGBUR NEGATIVE 01/13/2016 1404   BILIRUBINUR LARGE* 01/13/2016 1404   KETONESUR >80* 01/13/2016 1404   PROTEINUR 100* 01/13/2016 1404   UROBILINOGEN 0.2 01/07/2015 2128   NITRITE POSITIVE* 01/13/2016 1404   LEUKOCYTESUR SMALL* 01/13/2016 1404   Sepsis Labs: Recent Results (from the past 240 hour(s))  Wet prep, genital     Status: Abnormal   Collection Time: 01/13/16 11:10 PM  Result Value Ref Range Status   Yeast Wet Prep HPF POC NONE SEEN NONE SEEN Final   Trich, Wet Prep NONE SEEN NONE SEEN Final   Clue Cells Wet Prep HPF POC PRESENT (A) NONE SEEN Final   WBC, Wet Prep HPF POC MANY (A) NONE SEEN Final   Sperm NONE SEEN  Final     Radiological Exams  on Admission: Ct Abdomen Pelvis W Contrast  01/13/2016  CLINICAL DATA:  Lower abdominal tenderness for 1 week. Nausea. History of tubal ligation. EXAM: CT ABDOMEN AND PELVIS WITH CONTRAST TECHNIQUE: Multidetector CT imaging of the abdomen and pelvis was performed using the standard protocol following bolus administration of intravenous contrast. CONTRAST:  100mL ISOVUE-300 IOPAMIDOL (ISOVUE-300) INJECTION 61% COMPARISON:  None. FINDINGS: Lower chest and abdominal wall:  No contributory findings. Hepatobiliary: Probable hepatic steatosis. No focal finding.No evidence of biliary obstruction or stone. Pancreas: Unremarkable. Spleen: Unremarkable. Adrenals/Urinary Tract: Bilateral low-density adrenal nodules, 14 mm on the left and up to 2 cm on the right. Smaller denser nodule present on the right measures 16 mm. No hydronephrosis or stone. Unremarkable bladder. Stomach/Bowel:  No obstruction. No appendicitis. Reproductive:Pelvic fat inflammation and peritoneal thickening with bilateral adnexal rim enhancing collections.  The 2 right-sided cystic collections measure up to 46 mm and 27 mm. Left-sided collections measure up to 38 mm with this larger collection appearing intra-ovarian. No typical tubular fluid collection for hydrosalpinx or pyosalpinx. Unremarkable appearance of the uterus. Patient has positive istat HCG exam but subsequent negative laboratory result. Anticipate follow-up BHCG in this patient with tubal ligation. Vascular/Lymphatic: No acute vascular abnormality. No mass or adenopathy. Other: No ascites or pneumoperitoneum. Musculoskeletal: No acute abnormalities. IMPRESSION: 1. Pelvic inflammation with bilateral rim enhancing adnexal collections consistent with PID and tubo-ovarian abscess. The larger is on the right at 5 cm. Please correlate with pelvic exam and quantitative beta HCG. 2. Bilateral adrenal nodules, in the absence of malignancy history statistically adenomas. Electronically Signed   By: Marnee SpringJonathon  Watts M.D.   On: 01/13/2016 22:17    Assessment/Plan  Sepsis secondary to PID with Tubo-ovarian abscess: Acute. Patient with pain over the uterus lower abdomen for the last week and a half. Wet prep showing signs of clue cells and multiple WBCs. CT scan showing a tubo-ovarian abscess the largest approximately 5 cm in size. OB/GYN consulted and recommended IV antibiotics. She denies any sexual intercourse - Admit to a MedSurg bed   - Follow-up pending GC chlamydia and blood cultures - Continue antibiotics of cefoxitin, doxycycline, and metronidazole - Zofran prn for nausea - Hydrocodone prn pain - IV fluids Hypoxia: Acute.  O2 saturation 80% on RA. - nasal cannula oxygen to keep O2 saturations greater than 92% - checking d-dimer, and if elevated CT- angiogram of the chest   Diabetes mellitus type 2: Uncontrolled. Acute elevation could be partially contributed to acute infection. However, blood sugars on patient's previous visits have been in the 200s to 300s no hemoglobin A1c on file. Patient  does not appear to be on any diabetic medications at this time. - Check hemoglobin A1c in a.m. - Hypoglycemic protocol - Started Levemir 5 units daily at bedtime - CBGs every before meals and at bedtime with sensitive sliding scale insulin - Consider diabetic education in a.m.    DVT prophylaxis: Lovenox Code Status: Full   Family Communication: No family present at bedside Disposition Plan: Also will discharge home in 3-4 days consults called: none Admission status: Inpatient medsurge  Clydie Braunondell A Smith MD [facility] Pager [phone]  If 7PM-7AM, please contact night-coverage [email or web address] Password [email or web address]  01/14/2016, 12:16 AM

## 2016-01-15 ENCOUNTER — Inpatient Hospital Stay (HOSPITAL_COMMUNITY): Payer: Self-pay

## 2016-01-15 ENCOUNTER — Telehealth (HOSPITAL_BASED_OUTPATIENT_CLINIC_OR_DEPARTMENT_OTHER): Payer: Self-pay

## 2016-01-15 DIAGNOSIS — N7093 Salpingitis and oophoritis, unspecified: Secondary | ICD-10-CM

## 2016-01-15 DIAGNOSIS — R1110 Vomiting, unspecified: Secondary | ICD-10-CM

## 2016-01-15 DIAGNOSIS — E669 Obesity, unspecified: Secondary | ICD-10-CM

## 2016-01-15 DIAGNOSIS — E119 Type 2 diabetes mellitus without complications: Secondary | ICD-10-CM

## 2016-01-15 DIAGNOSIS — A419 Sepsis, unspecified organism: Principal | ICD-10-CM

## 2016-01-15 DIAGNOSIS — N739 Female pelvic inflammatory disease, unspecified: Secondary | ICD-10-CM | POA: Insufficient documentation

## 2016-01-15 LAB — CBC
HCT: 37.1 % (ref 36.0–46.0)
Hemoglobin: 10.9 g/dL — ABNORMAL LOW (ref 12.0–15.0)
MCH: 27.3 pg (ref 26.0–34.0)
MCHC: 29.4 g/dL — AB (ref 30.0–36.0)
MCV: 92.8 fL (ref 78.0–100.0)
PLATELETS: 272 10*3/uL (ref 150–400)
RBC: 4 MIL/uL (ref 3.87–5.11)
RDW: 13.5 % (ref 11.5–15.5)
WBC: 18.5 10*3/uL — AB (ref 4.0–10.5)

## 2016-01-15 LAB — COMPREHENSIVE METABOLIC PANEL
ALBUMIN: 2.4 g/dL — AB (ref 3.5–5.0)
ALT: 9 U/L — ABNORMAL LOW (ref 14–54)
ANION GAP: 7 (ref 5–15)
AST: 10 U/L — AB (ref 15–41)
Alkaline Phosphatase: 98 U/L (ref 38–126)
CHLORIDE: 90 mmol/L — AB (ref 101–111)
CO2: 38 mmol/L — ABNORMAL HIGH (ref 22–32)
Calcium: 8.4 mg/dL — ABNORMAL LOW (ref 8.9–10.3)
Creatinine, Ser: 0.63 mg/dL (ref 0.44–1.00)
GFR calc Af Amer: >60 mL/min (ref >60)
GFR calc non Af Amer: >60 mL/min (ref >60)
GLUCOSE: 201 mg/dL — AB (ref 65–99)
POTASSIUM: 3.6 mmol/L (ref 3.5–5.1)
SODIUM: 135 mmol/L (ref 135–145)
Total Bilirubin: 0.3 mg/dL (ref 0.3–1.2)
Total Protein: 6.4 g/dL — ABNORMAL LOW (ref 6.5–8.1)

## 2016-01-15 LAB — IRON AND TIBC
IRON: 9 ug/dL — AB (ref 28–170)
Saturation Ratios: 4 % — ABNORMAL LOW (ref 10.4–31.8)
TIBC: 227 ug/dL — AB (ref 250–450)
UIBC: 218 ug/dL

## 2016-01-15 LAB — FERRITIN: FERRITIN: 322 ng/mL — AB (ref 11–307)

## 2016-01-15 LAB — GLUCOSE, CAPILLARY
GLUCOSE-CAPILLARY: 196 mg/dL — AB (ref 65–99)
GLUCOSE-CAPILLARY: 228 mg/dL — AB (ref 65–99)
GLUCOSE-CAPILLARY: 238 mg/dL — AB (ref 65–99)
Glucose-Capillary: 208 mg/dL — ABNORMAL HIGH (ref 65–99)

## 2016-01-15 LAB — TROPONIN I: TROPONIN I: 0.04 ng/mL — AB (ref <0.03)

## 2016-01-15 LAB — LIPASE, BLOOD: Lipase: 17 U/L (ref 11–51)

## 2016-01-15 LAB — HEMOGLOBIN A1C
Hgb A1c MFr Bld: 10.7 % — ABNORMAL HIGH (ref 4.8–5.6)
Mean Plasma Glucose: 260 mg/dL

## 2016-01-15 LAB — VITAMIN B12: Vitamin B-12: 883 pg/mL (ref 180–914)

## 2016-01-15 MED ORDER — INSULIN STARTER KIT- PEN NEEDLES (ENGLISH): 1.0000 | Freq: Once | Status: DC

## 2016-01-15 MED ORDER — LIVING WELL WITH DIABETES BOOK
Freq: Once | Status: AC
  Administered 2016-01-15: 1
  Filled 2016-01-15: qty 1

## 2016-01-15 MED ORDER — INSULIN STARTER KIT- SYRINGES (ENGLISH)
1.0000 | Freq: Once | Status: DC
  Filled 2016-01-15: qty 1

## 2016-01-15 MED ORDER — PROMETHAZINE HCL 25 MG/ML IJ SOLN
12.5000 mg | Freq: Once | INTRAMUSCULAR | Status: AC
  Administered 2016-01-15: 12.5 mg via INTRAVENOUS
  Filled 2016-01-15: qty 1

## 2016-01-15 NOTE — Progress Notes (Signed)
Both pen and syringe were ordered for patient's diabetes education- Pharmacy does not want to charge patient for both at this time. Rn was told that patient's insurance may pay for one over the other. In order not to confuse the patient or charge her for both- pharmacist and Rn have decided to hold off on education until clarification is made about payment. Patient has however started the diabetes education videos at this time.

## 2016-01-15 NOTE — Progress Notes (Signed)
Patient stated that she was having chest pain- MD Tat notified. Awaiting any new orders.

## 2016-01-15 NOTE — Progress Notes (Signed)
Inpatient Diabetes Program Recommendations  AACE/ADA: New Consensus Statement on Inpatient Glycemic Control (2015)  Target Ranges:  Prepandial:   less than 140 mg/dL      Peak postprandial:   less than 180 mg/dL (1-2 hours)      Critically ill patients:  140 - 180 mg/dL   Results for Bianca Yang, Bianca Yang (MRN [id number]) as of 01/15/2016 12:57  Ref. Range 01/14/2016 08:24 01/14/2016 13:00 01/14/2016 17:10 01/14/2016 21:04 01/15/2016 07:24 01/15/2016 12:10  Glucose-Capillary Latest Ref Range: 65-99 mg/dL 409228 (H) 811260 (H) 914227 (H) 194 (H) 196 (H) 228 (H)    Review of Glycemic Control  Diabetes history: DM 2 Outpatient Diabetes medications: No home DM meds Current orders for Inpatient glycemic control: Levemir 20 units qd + Novolog correction 0-15 units tid + 0-5 units hs  Inpatient Diabetes Program Recommendations:  Noted patient does not have insurance listed. Case management consult placed. Spoke with nurse and plans to start insulin injection teaching pen and syringe when appropriate. DM insulin videos ordered. Noted patient currently c/o chest pain.  Thank you, Billy FischerJudy E. Othelia Riederer, RN, MSN, CDE Inpatient Glycemic Control Team Team Pager [phone] (8am-5pm) 01/15/2016 1:06 PM

## 2016-01-15 NOTE — Progress Notes (Addendum)
PROGRESS NOTE  Bianca Yang ZOX:[id number] DOB: [birth year] DOA: 01/13/2016 PCP: [name] Per Patient  Brief History:  40 year old female with a history of diabetes mellitus type 2, tobacco abuse presented with 10 day history of lower abdominal pain with subjective fevers and chills with associated nausea. At the time of presentation, the patient was noted to have WBC 23.3 with fever up to 101.64F. CT of the abdomen and pelvis revealed pelvic fat and peritoneal thickening with bilateral adnexal ring enhancing collections suggestive of PID with tubo-ovarian abscesses. OB/GYN was consulted emergency department and recommended medical therapy. The patient was started on cefoxitin, doxycycline, and metronidazole.  Assessment/Plan: Sepsis -Presented with fever and leukocytosis -Secondary to PID and tubo-ovarian abscess -Chlamydia DNA probe positive -Continue IV fluids -Continue cefoxitin, doxycycline, metronidazole -am CBC  PID with tubo-ovarian abscess -HIV and RPR negative -Continue cefoxitin, doxycycline, metronidazole -WBC and fevers improving -continue IV morphine prn pain  Intractable vomiting -Check lipase -2 view abdominal x-ray -Likely secondary to infectious process and opioids -Continue antiemetics  Atypical chest pain -01/14/2016 CT adjuvant chest negative for pulmonary embolus but showed moderate bibasilar atelectasis -EKG -cycle Troponins  Acute respiratory failure with hypoxia  -Likely due to undiagnosed COPD and hypoventilation secondary to abdominal pain  -Presently stable on 3 L with sat 93%  Diabetes mellitus type 2 -01/14/2016 hemoglobin A1c 10.7 -Continue Levemir 20 units -NovoLog sliding scale  Tobacco abuse -Cessation discussed  Alcohol and marijuana use -Patient uses marijuana 2-3 times per week -She drinks approximately 4 beers on weekends -No signs of withdrawal -Last alcoholic beverage one week prior to admission  [place]  Anemia -iron/TIBC -RBC folate -Serum B12  Disposition Plan:   Home in 2-3 days  Family Communication:   Mother updated at bedside  Consultants:  OBGYN  Code Status:  FULL  DVT Prophylaxis:  Breathitt Lovenox   Procedures: As Listed in Progress Note Above  Antibiotics: Cefoxitin 01/14/2016>>> Doxycycline 01/14/2016>>> Metronidazole 01/14/2016>>>    Subjective:  patient feels that her abdominal pain is a little better than yesterday but continues to have sharp pain in the left lower quadrant right lower quadrant does worsen with coughing and movement. Complains of substernal chest pain that is worse with movement. She describes it as a pressure-like sensation with shortness of breath. Has had a number of episodes of nausea and vomiting without blood. No dysuria, hematuria, hematochezia, melena. Denies any headache, visual disturbance.  Objective: Filed Vitals:   01/14/16 1307 01/14/16 2104 01/15/16 0618 01/15/16 1302  BP: 117/63 115/62 106/60 114/67  Pulse: 88 91 73 81  Temp: 99 F (37.2 C) 100.2 F (37.9 C) 98.7 F (37.1 C) 98.4 F (36.9 C)  TempSrc: Oral Oral Oral Oral  Resp: 18 17 18 18   Height:      Weight:      SpO2: 93% 92% 94% 93%    Intake/Output Summary (Last 24 hours) at 01/15/16 1316 Last data filed at 01/15/16 8119  Gross per 24 hour  Intake 1148.33 ml  Output   1650 ml  Net -501.67 ml   Weight change:  Exam:   General:  Pt is alert, follows commands appropriately, not in acute distress  HEENT: No icterus, No thrush, No neck mass, [place]/AT  Cardiovascular: RRR, S1/S2, no rubs, no gallops  Respiratory: Bibasilar crackles. No wheezes   Abdomen: Soft/+BS, LLQ, RLQ pain without rebound, non distended, no guarding  Extremities: No edema, No lymphangitis, No petechiae, No rashes,  no synovitis   Data Reviewed: I have personally reviewed following labs and imaging studies Basic Metabolic Panel:  Recent Labs Lab 01/13/16 1403 01/14/16 0215  01/15/16 0220  NA 129* 132* 135  K 3.7 3.3* 3.6  CL 87* 89* 90*  CO2 32 34* 38*  GLUCOSE 309* 317* 201*  BUN 7 <5* <5*  CREATININE 0.62 0.63 0.63  CALCIUM 8.9 8.0* 8.4*   Liver Function Tests:  Recent Labs Lab 01/13/16 1403 01/15/16 0220  AST 12* 10*  ALT 12* 9*  ALKPHOS 116 98  BILITOT 0.8 0.3  PROT 7.2 6.4*  ALBUMIN 3.3* 2.4*    Recent Labs Lab 01/13/16 1403  LIPASE 16   No results for input(s): AMMONIA in the last 168 hours. Coagulation Profile: No results for input(s): INR, PROTIME in the last 168 hours. CBC:  Recent Labs Lab 01/13/16 1403 01/14/16 0215 01/15/16 0220  WBC 23.3* 19.4* 18.5*  HGB 13.7 12.1 10.9*  HCT 42.8 38.8 37.1  MCV 89.2 90.4 92.8  PLT 305 278 272   Cardiac Enzymes: No results for input(s): CKTOTAL, CKMB, CKMBINDEX, TROPONINI in the last 168 hours. BNP: Invalid input(s): POCBNP CBG:  Recent Labs Lab 01/14/16 1300 01/14/16 1710 01/14/16 2104 01/15/16 0724 01/15/16 1210  GLUCAP 260* 227* 194* 196* 228*   HbA1C:  Recent Labs  01/14/16 0215  HGBA1C 10.7*   Urine analysis:    Component Value Date/Time   COLORURINE ORANGE* 01/13/2016 1404   APPEARANCEUR CLOUDY* 01/13/2016 1404   LABSPEC 1.026 01/13/2016 1404   PHURINE 6.0 01/13/2016 1404   GLUCOSEU 250* 01/13/2016 1404   HGBUR NEGATIVE 01/13/2016 1404   BILIRUBINUR LARGE* 01/13/2016 1404   KETONESUR >80* 01/13/2016 1404   PROTEINUR 100* 01/13/2016 1404   UROBILINOGEN 0.2 01/07/2015 2128   NITRITE POSITIVE* 01/13/2016 1404   LEUKOCYTESUR SMALL* 01/13/2016 1404   Sepsis Labs: @LABRCNTIP (procalcitonin:4,lacticidven:4) ) Recent Results (from the past 240 hour(s))  Wet prep, genital     Status: Abnormal   Collection Time: 01/13/16 11:10 PM  Result Value Ref Range Status   Yeast Wet Prep HPF POC NONE SEEN NONE SEEN Final   Trich, Wet Prep NONE SEEN NONE SEEN Final   Clue Cells Wet Prep HPF POC PRESENT (A) NONE SEEN Final   WBC, Wet Prep HPF POC MANY (A) NONE SEEN  Final   Sperm NONE SEEN  Final     Scheduled Meds: . cefOXitin  2 g Intravenous Q6H  . doxycycline (VIBRAMYCIN) IV  100 mg Intravenous Q12H  . enoxaparin (LOVENOX) injection  40 mg Subcutaneous Q24H  . insulin aspart  0-15 Units Subcutaneous TID WC  . insulin aspart  0-5 Units Subcutaneous QHS  . insulin detemir  20 Units Subcutaneous QHS  . insulin starter kit- pen needles  1 kit Other Once  . insulin starter kit- syringes  1 kit Other Once  . living well with diabetes book   Does not apply Once  . metronidazole  500 mg Intravenous Q8H  . sodium chloride flush  3 mL Intravenous Q12H   Continuous Infusions: . sodium chloride 100 mL/hr at 01/14/16 1830    Procedures/Studies: Ct Angio Chest Pe W Or Wo Contrast  01/14/2016  CLINICAL DATA:  Hypoxia. Pain with deep breath in the right shoulder. EXAM: CT ANGIOGRAPHY CHEST WITH CONTRAST TECHNIQUE: Multidetector CT imaging of the chest was performed using the standard protocol during bolus administration of intravenous contrast. Multiplanar CT image reconstructions and MIPs were obtained to evaluate the vascular anatomy. CONTRAST:  100 cc Isovue 300 intravenous COMPARISON:  None. FINDINGS: Cardiovascular: Negative for pulmonary embolism. Normal heart size. No pericardial effusion. Mediastinum: Asymmetric enlargement of the left thyroid, suspect underlying 21 mm thyroid nodule. Lungs/Pleura: Moderate bibasilar atelectasis, increased from previous abdominal CT. There is no edema, consolidation, effusion, or pneumothorax. Upper abdomen: Trace fluid seen around the right liver. Bilateral adrenal nodules as described on dedicated abdominal CT. Musculoskeletal: No acute or aggressive process. Review of the MIP images confirms the above findings. IMPRESSION: 1. Negative for pulmonary embolism. 2. Moderate basilar atelectasis, increased from abdominal CT yesterday. 3. Trace perihepatic fluid. Given abdominal CT findings consider perihepatitis as cause of  right shoulder pain. 4. Suspect 2 cm left thyroid nodule. Recommend nonemergent sonography. Electronically Signed   By: Marnee Spring M.D.   On: 01/14/2016 05:05   Ct Abdomen Pelvis W Contrast  01/13/2016  CLINICAL DATA:  Lower abdominal tenderness for 1 week. Nausea. History of tubal ligation. EXAM: CT ABDOMEN AND PELVIS WITH CONTRAST TECHNIQUE: Multidetector CT imaging of the abdomen and pelvis was performed using the standard protocol following bolus administration of intravenous contrast. CONTRAST:  ISOVUE-300 IOPAMIDOL (ISOVUE-300) INJECTION 61% COMPARISON:  None. FINDINGS: Lower chest and abdominal wall:  No contributory findings. Hepatobiliary: Probable hepatic steatosis. No focal finding.No evidence of biliary obstruction or stone. Pancreas: Unremarkable. Spleen: Unremarkable. Adrenals/Urinary Tract: Bilateral low-density adrenal nodules, 14 mm on the left and up to 2 cm on the right. Smaller denser nodule present on the right measures 16 mm. No hydronephrosis or stone. Unremarkable bladder. Stomach/Bowel:  No obstruction. No appendicitis. Reproductive:Pelvic fat inflammation and peritoneal thickening with bilateral adnexal rim enhancing collections. The 2 right-sided cystic collections measure up to 46 mm and 27 mm. Left-sided collections measure up to 38 mm with this larger collection appearing intra-ovarian. No typical tubular fluid collection for hydrosalpinx or pyosalpinx. Unremarkable appearance of the uterus. Patient has positive istat HCG exam but subsequent negative laboratory result. Anticipate follow-up BHCG in this patient with tubal ligation. Vascular/Lymphatic: No acute vascular abnormality. No mass or adenopathy. Other: No ascites or pneumoperitoneum. Musculoskeletal: No acute abnormalities. IMPRESSION: 1. Pelvic inflammation with bilateral rim enhancing adnexal collections consistent with PID and tubo-ovarian abscess. The larger is on the right at 5 cm. Please correlate with pelvic  exam and quantitative beta HCG. 2. Bilateral adrenal nodules, in the absence of malignancy history statistically adenomas. Electronically Signed   By: Marnee Spring M.D.   On: 01/13/2016 22:17   Dg Chest Port 1 View  01/14/2016  CLINICAL DATA:  Dyspnea EXAM: PORTABLE CHEST 1 VIEW COMPARISON:  01/07/2015 FINDINGS: Streaky bibasilar opacity, increased from previous abdominal CT. Lung volumes are low. No edema or effusion. The lateral right costophrenic sulcus is excluded from view. Normal heart size and mediastinal contours. IMPRESSION: Low volumes with bibasilar atelectasis, increased from earlier abdominal CT. Electronically Signed   By: Marnee Spring M.D.   On: 01/14/2016 02:49    Lelend Heinecke, DO  [facility] Pager [phone]  If 7PM-7AM, please contact night-coverage [email or web address] Password [email or web address] 01/15/2016, 1:16 PM   LOS: 2 days

## 2016-01-15 NOTE — Progress Notes (Signed)
CRITICAL VALUE ALERT  Critical value received:  Troponin .04  Date of notification:  01/15/2016  Time of notification:  4:30pm  Critical value read back:Yes.    Nurse who received alert:  Marlon Pelora gardiner   MD notified (1st page):  Tat, MD  Time of first page:  4:31pm  MD notified (2nd page):  Time of second page:  Responding MD:   Time MD responded:

## 2016-01-16 ENCOUNTER — Inpatient Hospital Stay (HOSPITAL_COMMUNITY): Payer: MEDICAID

## 2016-01-16 LAB — CBC
HEMATOCRIT: 35.9 % — AB (ref 36.0–46.0)
Hemoglobin: 10.5 g/dL — ABNORMAL LOW (ref 12.0–15.0)
MCH: 27.5 pg (ref 26.0–34.0)
MCHC: 29.2 g/dL — ABNORMAL LOW (ref 30.0–36.0)
MCV: 94 fL (ref 78.0–100.0)
Platelets: 269 10*3/uL (ref 150–400)
RBC: 3.82 MIL/uL — ABNORMAL LOW (ref 3.87–5.11)
RDW: 13.6 % (ref 11.5–15.5)
WBC: 15.5 10*3/uL — AB (ref 4.0–10.5)

## 2016-01-16 LAB — BASIC METABOLIC PANEL
Anion gap: 4 — ABNORMAL LOW (ref 5–15)
CHLORIDE: 89 mmol/L — AB (ref 101–111)
CO2: 40 mmol/L — AB (ref 22–32)
CREATININE: 0.78 mg/dL (ref 0.44–1.00)
Calcium: 8.6 mg/dL — ABNORMAL LOW (ref 8.9–10.3)
GFR calc Af Amer: >60 mL/min (ref >60)
GFR calc non Af Amer: >60 mL/min (ref >60)
Glucose, Bld: 295 mg/dL — ABNORMAL HIGH (ref 65–99)
POTASSIUM: 3.9 mmol/L (ref 3.5–5.1)
SODIUM: 133 mmol/L — AB (ref 135–145)

## 2016-01-16 LAB — GLUCOSE, CAPILLARY
GLUCOSE-CAPILLARY: 222 mg/dL — AB (ref 65–99)
GLUCOSE-CAPILLARY: 237 mg/dL — AB (ref 65–99)
Glucose-Capillary: 166 mg/dL — ABNORMAL HIGH (ref 65–99)
Glucose-Capillary: 229 mg/dL — ABNORMAL HIGH (ref 65–99)

## 2016-01-16 LAB — FOLATE RBC
FOLATE, HEMOLYSATE: 502.4 ng/mL
FOLATE, RBC: 1384 ng/mL (ref >498)
HEMATOCRIT: 36.3 % (ref 34.0–46.6)

## 2016-01-16 LAB — TROPONIN I

## 2016-01-16 MED ORDER — DIATRIZOATE MEGLUMINE & SODIUM 66-10 % PO SOLN
ORAL | Status: AC
  Administered 2016-01-16: 19:00:00
  Filled 2016-01-16: qty 30

## 2016-01-16 MED ORDER — IOPAMIDOL (ISOVUE-300) INJECTION 61%
INTRAVENOUS | Status: AC
  Administered 2016-01-16: 100 mL
  Filled 2016-01-16: qty 100

## 2016-01-16 MED ORDER — DIATRIZOATE MEGLUMINE & SODIUM 66-10 % PO SOLN
15.0000 mL | ORAL | Status: AC
  Filled 2016-01-16: qty 30

## 2016-01-16 NOTE — Progress Notes (Signed)
Patient had her CT scan done of her abdomen, upon arrival back to unit 6N, patient was already eating a very large roast beef sandwich from [employer].  I will advance her diet back to [facility] since she is already eating.

## 2016-01-16 NOTE — Care Management Note (Signed)
Case Management Note  Patient Details  Name: Bianca Yang MRN: [id number] Date of Birth: May 14, 1976  Subjective/Objective:                    Action/Plan: Provided patient with MATCH letter dated 01-17-16 to 01-24-16. Explained same .  [facility]. [facility] , however, was instructed to have patient call Monday January 20, 2016 at 0900 to make an appointment ( August appointments available) .   Most affordable glucometer is available [facility]   Patient voiced understanding of all of above .    Expected Discharge Date:                  Expected Discharge Plan:  Home/Self Care  In-House Referral:     Discharge planning Services  CM Consult, Medication Assistance, [facility], [facility]  Post Acute Care Choice:    Choice offered to:  Patient  DME Arranged:    DME Agency:     HH Arranged:    HH Agency:     Status of Service:  Completed, signed off  If discussed at [employer] Meetings, dates discussed:    Additional Comments:  Kingsley PlanWile, Bianca Garabedian Marie, RN 01/16/2016, 3:06 PM

## 2016-01-16 NOTE — Progress Notes (Signed)
PROGRESS NOTE  Bianca Yang GUR:[id number] DOB: Oct 12, 1975 DOA: 01/13/2016 PCP: [name] Per Patient  Brief History:  40 year old female with a history of diabetes mellitus type 2, tobacco abuse presented with 10 day history of lower abdominal pain with subjective fevers and chills with associated nausea. At the time of presentation, the patient was noted to have WBC 23.3 with fever up to 101.74F. CT of the abdomen and pelvis revealed pelvic fat and peritoneal thickening with bilateral adnexal ring enhancing collections suggestive of PID with tubo-ovarian abscesses. OB/GYN was consulted emergency department and recommended medical therapy. The patient was started on cefoxitin, doxycycline, and metronidazole.  She began having clinical improvement. Unfortunately, the patient continued to have vomiting.  Abdominal x-ray showed mild dilated loops of jejunum and upper abdomen with scattered air-fluid levels. There was concern of jejunal ileus or partial obstruction.  Assessment/Plan: Sepsis -Presented with fever and leukocytosis -Secondary to PID and tubo-ovarian abscess -Chlamydia DNA probe positive -Continue IV fluids -Continue cefoxitin, doxycycline, metronidazole -am CBC  PID with tubo-ovarian abscess -HIV and RPR negative -Continue cefoxitin, doxycycline, metronidazole -WBC and fevers improving -continue IV morphine prn pain  Intractable vomiting -Check lipase--17 -2 view abdominal x-ray--mild dilated loops of jejunum and upper abdomen with scattered air-fluid levels. There was concern of jejunal ileus or partial obstruction -CT abd/pelvis with contrast to clarify if she has obstruction -Likely secondary to infectious process and opioids -Continue antiemetics  Atypical chest pain -01/14/2016 CT adjuvant chest negative for pulmonary embolus but showed moderate bibasilar atelectasis -EKG--sinus rhythm, no ST-T wave changes -cycle Troponins--flat, unremarkable  Acute  respiratory failure with hypoxia  -Likely due to undiagnosed COPD and hypoventilation secondary to abdominal pain  -Presently stable on 3 L with sat 93% -01/14/2016 CT angiogram chest--negative pulmonary embolus; no edema or infiltrates   Diabetes mellitus type 2 -01/14/2016 hemoglobin A1c 10.7 -Continue Levemir 20 units For now as the patient has been made npo and CT abd pending -NovoLog sliding scale  Tobacco abuse -Cessation discussed  Alcohol and marijuana use -Patient uses marijuana 2-3 times per week -She drinks approximately 4 beers on weekends -No signs of withdrawal -Last alcoholic beverage one week prior to admission  [place] Anemia/Fe deficiency -iron/TIBC--iron sat 4 % -RBC folate--1384 -Serum B12--883 -will ultimately need iron supplementation when able to tolerate po  Disposition Plan: Home in 2-3 days  Family Communication: Mother updated on phone 7/20  Consultants: OBGYN  Code Status: FULL  DVT Prophylaxis: [place] Lovenox   Procedures: As Listed in Progress Note Above  Antibiotics: Cefoxitin 01/14/2016>>> Doxycycline 01/14/2016>>> Metronidazole 01/14/2016>>>    Subjective:  overall, she feels her abdominal pain has improved but she continues to have nausea and vomiting. Denies fevers, chills, headache, neck pain, chest pain, shortness breath, dysuria, hematuria. Complains of constipation. Abdominal pain is still sharp in nature in the lower quadrant area. Some worsening with movement  Objective: Filed Vitals:   01/15/16 1344 01/15/16 2107 01/16/16 0603 01/16/16 1344  BP: 107/61 134/65 128/67 111/70  Pulse: 82 85 96 80  Temp: 98.4 F (36.9 C) 98.7 F (37.1 C) 100.2 F (37.9 C) 98.8 F (37.1 C)  TempSrc: Oral Oral Oral Oral  Resp: 18 17 16 17   Height:      Weight:      SpO2: 93% 93% 91% 93%    Intake/Output Summary (Last 24 hours) at 01/16/16 1734 Last data filed at 01/16/16 1515  Gross per 24 hour  Intake  1020 ml  Output   3500 ml    Net  -2480 ml   Weight change:  Exam:   General:  Pt is alert, follows commands appropriately, not in acute distress  HEENT: No icterus, No thrush, No neck mass, [place]/AT  Cardiovascular: RRR, S1/S2, no rubs, no gallops  Respiratory: Bibasilar crackles, right greater than left. No wheezing.   Abdomen: Soft/+BS, LLQ, RLQ tender without rebound, non distended, no guarding  Extremities: No edema, No lymphangitis, No petechiae, No rashes, no synovitis   Data Reviewed: I have personally reviewed following labs and imaging studies Basic Metabolic Panel:  Recent Labs Lab 01/13/16 1403 01/14/16 0215 01/15/16 0220 01/16/16 0152  NA 129* 132* 135 133*  K 3.7 3.3* 3.6 3.9  CL 87* 89* 90* 89*  CO2 32 34* 38* 40*  GLUCOSE 309* 317* 201* 295*  BUN 7 <5* <5* <5*  CREATININE 0.62 0.63 0.63 0.78  CALCIUM 8.9 8.0* 8.4* 8.6*   Liver Function Tests:  Recent Labs Lab 01/13/16 1403 01/15/16 0220  AST 12* 10*  ALT 12* 9*  ALKPHOS 116 98  BILITOT 0.8 0.3  PROT 7.2 6.4*  ALBUMIN 3.3* 2.4*    Recent Labs Lab 01/13/16 1403 01/15/16 1440  LIPASE 16 17   No results for input(s): AMMONIA in the last 168 hours. Coagulation Profile: No results for input(s): INR, PROTIME in the last 168 hours. CBC:  Recent Labs Lab 01/13/16 1403 01/14/16 0215 01/15/16 0220 01/15/16 1440 01/16/16 0152  WBC 23.3* 19.4* 18.5*  --  15.5*  HGB 13.7 12.1 10.9*  --  10.5*  HCT 42.8 38.8 37.1 36.3 35.9*  MCV 89.2 90.4 92.8  --  94.0  PLT 305 278 272  --  269   Cardiac Enzymes:  Recent Labs Lab 01/15/16 1440 01/15/16 1848 01/16/16 0152  TROPONINI 0.04* <0.03 <0.03   BNP: Invalid input(s): POCBNP CBG:  Recent Labs Lab 01/15/16 1650 01/15/16 2106 01/16/16 0745 01/16/16 1200 01/16/16 1649  GLUCAP 208* 238* 222* 166* 237*   HbA1C:  Recent Labs  01/14/16 0215  HGBA1C 10.7*   Urine analysis:    Component Value Date/Time   COLORURINE ORANGE* 01/13/2016 1404   APPEARANCEUR  CLOUDY* 01/13/2016 1404   LABSPEC 1.026 01/13/2016 1404   PHURINE 6.0 01/13/2016 1404   GLUCOSEU 250* 01/13/2016 1404   HGBUR NEGATIVE 01/13/2016 1404   BILIRUBINUR LARGE* 01/13/2016 1404   KETONESUR >80* 01/13/2016 1404   PROTEINUR 100* 01/13/2016 1404   UROBILINOGEN 0.2 01/07/2015 2128   NITRITE POSITIVE* 01/13/2016 1404   LEUKOCYTESUR SMALL* 01/13/2016 1404   Sepsis Labs: @LABRCNTIP (procalcitonin:4,lacticidven:4) ) Recent Results (from the past 240 hour(s))  Wet prep, genital     Status: Abnormal   Collection Time: 01/13/16 11:10 PM  Result Value Ref Range Status   Yeast Wet Prep HPF POC NONE SEEN NONE SEEN Final   Trich, Wet Prep NONE SEEN NONE SEEN Final   Clue Cells Wet Prep HPF POC PRESENT (A) NONE SEEN Final   WBC, Wet Prep HPF POC MANY (A) NONE SEEN Final   Sperm NONE SEEN  Final     Scheduled Meds: . cefOXitin  2 g Intravenous Q6H  . diatrizoate meglumine-sodium  15 mL Oral Q1 Hr x 2  . diatrizoate meglumine-sodium      . doxycycline (VIBRAMYCIN) IV  100 mg Intravenous Q12H  . enoxaparin (LOVENOX) injection  40 mg Subcutaneous Q24H  . insulin aspart  0-15 Units Subcutaneous TID WC  . insulin aspart  0-5 Units  Subcutaneous QHS  . insulin detemir  20 Units Subcutaneous QHS  . insulin starter kit- syringes  1 kit Other Once  . metronidazole  500 mg Intravenous Q8H  . sodium chloride flush  3 mL Intravenous Q12H   Continuous Infusions: . sodium chloride 100 mL/hr at 01/16/16 1657    Procedures/Studies: Ct Angio Chest Pe W Or Wo Contrast  01/14/2016  CLINICAL DATA:  Hypoxia. Pain with deep breath in the right shoulder. EXAM: CT ANGIOGRAPHY CHEST WITH CONTRAST TECHNIQUE: Multidetector CT imaging of the chest was performed using the standard protocol during bolus administration of intravenous contrast. Multiplanar CT image reconstructions and MIPs were obtained to evaluate the vascular anatomy. CONTRAST:  100 cc Isovue 300 intravenous COMPARISON:  None. FINDINGS:  Cardiovascular: Negative for pulmonary embolism. Normal heart size. No pericardial effusion. Mediastinum: Asymmetric enlargement of the left thyroid, suspect underlying 21 mm thyroid nodule. Lungs/Pleura: Moderate bibasilar atelectasis, increased from previous abdominal CT. There is no edema, consolidation, effusion, or pneumothorax. Upper abdomen: Trace fluid seen around the right liver. Bilateral adrenal nodules as described on dedicated abdominal CT. Musculoskeletal: No acute or aggressive process. Review of the MIP images confirms the above findings. IMPRESSION: 1. Negative for pulmonary embolism. 2. Moderate basilar atelectasis, increased from abdominal CT yesterday. 3. Trace perihepatic fluid. Given abdominal CT findings consider perihepatitis as cause of right shoulder pain. 4. Suspect 2 cm left thyroid nodule. Recommend nonemergent sonography. Electronically Signed   By: Marnee Spring M.D.   On: 01/14/2016 05:05   Ct Abdomen Pelvis W Contrast  01/13/2016  CLINICAL DATA:  Lower abdominal tenderness for 1 week. Nausea. History of tubal ligation. EXAM: CT ABDOMEN AND PELVIS WITH CONTRAST TECHNIQUE: Multidetector CT imaging of the abdomen and pelvis was performed using the standard protocol following bolus administration of intravenous contrast. CONTRAST:  ISOVUE-300 IOPAMIDOL (ISOVUE-300) INJECTION 61% COMPARISON:  None. FINDINGS: Lower chest and abdominal wall:  No contributory findings. Hepatobiliary: Probable hepatic steatosis. No focal finding.No evidence of biliary obstruction or stone. Pancreas: Unremarkable. Spleen: Unremarkable. Adrenals/Urinary Tract: Bilateral low-density adrenal nodules, 14 mm on the left and up to 2 cm on the right. Smaller denser nodule present on the right measures 16 mm. No hydronephrosis or stone. Unremarkable bladder. Stomach/Bowel:  No obstruction. No appendicitis. Reproductive:Pelvic fat inflammation and peritoneal thickening with bilateral adnexal rim enhancing  collections. The 2 right-sided cystic collections measure up to 46 mm and 27 mm. Left-sided collections measure up to 38 mm with this larger collection appearing intra-ovarian. No typical tubular fluid collection for hydrosalpinx or pyosalpinx. Unremarkable appearance of the uterus. Patient has positive istat HCG exam but subsequent negative laboratory result. Anticipate follow-up BHCG in this patient with tubal ligation. Vascular/Lymphatic: No acute vascular abnormality. No mass or adenopathy. Other: No ascites or pneumoperitoneum. Musculoskeletal: No acute abnormalities. IMPRESSION: 1. Pelvic inflammation with bilateral rim enhancing adnexal collections consistent with PID and tubo-ovarian abscess. The larger is on the right at 5 cm. Please correlate with pelvic exam and quantitative beta HCG. 2. Bilateral adrenal nodules, in the absence of malignancy history statistically adenomas. Electronically Signed   By: Marnee Spring M.D.   On: 01/13/2016 22:17   Dg Chest Port 1 View  01/14/2016  CLINICAL DATA:  Dyspnea EXAM: PORTABLE CHEST 1 VIEW COMPARISON:  01/07/2015 FINDINGS: Streaky bibasilar opacity, increased from previous abdominal CT. Lung volumes are low. No edema or effusion. The lateral right costophrenic sulcus is excluded from view. Normal heart size and mediastinal contours. IMPRESSION: Low volumes with bibasilar atelectasis,  increased from earlier abdominal CT. Electronically Signed   By: Marnee Spring M.D.   On: 01/14/2016 02:49   Dg Abd 2 Views  01/15/2016  CLINICAL DATA:  Intractable vomiting. EXAM: ABDOMEN - 2 VIEW COMPARISON:  Abdomen pelvis CT dated 01/13/2016. Chest CTA dated 01/14/2016. FINDINGS: Mildly dilated loops of jejunum in the left mid upper abdomen. Scattered air-fluid levels. Normal caliber colon with mildly prominent stool in the right and proximal transverse colon. No free peritoneal air. Minimal scoliosis and mild lower thoracic spine degenerative changes. Bibasilar  atelectasis. IMPRESSION: Mild jejunal ileus or partial obstruction. Electronically Signed   By: Beckie Salts M.D.   On: 01/15/2016 16:28    Aleigh Grunden, DO  [facility] Pager [phone]  If 7PM-7AM, please contact night-coverage [email or web address] Password [email or web address] 01/16/2016, 5:34 PM   LOS: 3 days

## 2016-01-16 NOTE — Progress Notes (Addendum)
Inpatient Diabetes Program Recommendations  AACE/ADA: New Consensus Statement on Inpatient Glycemic Control (2015)  Target Ranges:  Prepandial:   less than 140 mg/dL      Peak postprandial:   less than 180 mg/dL (1-2 hours)      Critically ill patients:  140 - 180 mg/dL   Results for Colletta MarylandSTANLEY, Bianca Yang (MRN [id number]) as of 01/16/2016 10:48  Ref. Range 01/15/2016 07:24 01/15/2016 12:10 01/15/2016 16:50 01/15/2016 21:06 01/16/2016 07:45  Glucose-Capillary Latest Ref Range: 65-99 mg/dL 409196 (H) 811228 (H) 914208 (H) 238 (H) 222 (H)    Review of Glycemic Control  Diabetes history: DM 2 Outpatient Diabetes medications: No home DM meds Current orders for Inpatient glycemic control: Levemir 20 units qd + Novolog correction 0-15 units tid + 0-5 units hs  Inpatient Diabetes Program Recommendations:  Noted patient is Medicaid potential. [facility], may be able to afford medications. If not, may consider changing to 70/30 insulin. Nurses, please continue to teach insulin pen and syringe administration and allow patient to administer insulin injections in the hospital. Attempted to speak to patient concerning new to insulin. Patient immediately closed eyes and did not answer questions regarding insulin. Patient was cordial for me to enter room and stated "I'm tired". If patient not willing to take insulin post discharge may want to consider oral meds of Glipizide and Metformin to decrease blood sugars and increase compliance of taking medications.  Thank you, Bianca FischerJudy E. Keymon Mcelroy, RN, MSN, CDE Inpatient Glycemic Control Team Team Pager [phone] (8am-5pm) 01/16/2016 10:51 AM

## 2016-01-17 LAB — CBC
HCT: 37.8 % (ref 36.0–46.0)
HEMOGLOBIN: 11.2 g/dL — AB (ref 12.0–15.0)
MCH: 27.6 pg (ref 26.0–34.0)
MCHC: 29.6 g/dL — ABNORMAL LOW (ref 30.0–36.0)
MCV: 93.1 fL (ref 78.0–100.0)
PLATELETS: 306 10*3/uL (ref 150–400)
RBC: 4.06 MIL/uL (ref 3.87–5.11)
RDW: 13.4 % (ref 11.5–15.5)
WBC: 12.1 10*3/uL — AB (ref 4.0–10.5)

## 2016-01-17 LAB — GLUCOSE, CAPILLARY
GLUCOSE-CAPILLARY: 208 mg/dL — AB (ref 65–99)
GLUCOSE-CAPILLARY: 282 mg/dL — AB (ref 65–99)
GLUCOSE-CAPILLARY: 139 mg/dL — AB (ref 65–99)
GLUCOSE-CAPILLARY: 237 mg/dL — AB (ref 65–99)

## 2016-01-17 LAB — BASIC METABOLIC PANEL
ANION GAP: 5 (ref 5–15)
BUN: <5 mg/dL — ABNORMAL LOW (ref 6–20)
CHLORIDE: 94 mmol/L — AB (ref 101–111)
CO2: 41 mmol/L — ABNORMAL HIGH (ref 22–32)
Calcium: 9.3 mg/dL (ref 8.9–10.3)
Creatinine, Ser: 0.67 mg/dL (ref 0.44–1.00)
GFR calc Af Amer: >60 mL/min (ref >60)
Glucose, Bld: 279 mg/dL — ABNORMAL HIGH (ref 65–99)
POTASSIUM: 4.4 mmol/L (ref 3.5–5.1)
SODIUM: 140 mmol/L (ref 135–145)

## 2016-01-17 MED ORDER — INSULIN STARTER KIT- SYRINGES (ENGLISH): 1.0000 | Freq: Once | Status: DC

## 2016-01-17 MED ORDER — SENNA 8.6 MG PO TABS
2.0000 | ORAL_TABLET | Freq: Every day | ORAL | Status: DC
  Administered 2016-01-17 – 2016-01-18 (×2): 17.2 mg via ORAL
  Filled 2016-01-17 (×2): qty 2

## 2016-01-17 MED ORDER — LIVING WELL WITH DIABETES BOOK
Freq: Once | Status: AC
  Administered 2016-01-17: 13:00:00
  Filled 2016-01-17: qty 1

## 2016-01-17 MED ORDER — CALCIUM CARBONATE ANTACID 500 MG PO CHEW
1.0000 | CHEWABLE_TABLET | Freq: Two times a day (BID) | ORAL | Status: DC
  Administered 2016-01-17 – 2016-01-18 (×2): 200 mg via ORAL
  Filled 2016-01-17 (×3): qty 1

## 2016-01-17 MED ORDER — DOCUSATE SODIUM 100 MG PO CAPS
100.0000 mg | ORAL_CAPSULE | Freq: Two times a day (BID) | ORAL | Status: DC
  Administered 2016-01-17 – 2016-01-18 (×3): 100 mg via ORAL
  Filled 2016-01-17 (×3): qty 1

## 2016-01-17 MED ORDER — INSULIN ASPART PROT & ASPART (70-30 MIX) 100 UNIT/ML ~~LOC~~ SUSP
18.0000 [IU] | Freq: Two times a day (BID) | SUBCUTANEOUS | Status: DC
  Administered 2016-01-17 – 2016-01-18 (×2): 18 [IU] via SUBCUTANEOUS
  Filled 2016-01-17: qty 10

## 2016-01-17 NOTE — Progress Notes (Addendum)
PROGRESS NOTE  Bianca Yang ZOX:[id number] DOB: 12/24/1975 DOA: 01/13/2016 PCP: [name] Per Patient  Brief History:  40 year old female with a history of diabetes mellitus type 2, tobacco abuse presented with 10 day history of lower abdominal pain with subjective fevers and chills with associated nausea. At the time of presentation, the patient was noted to have WBC 23.3 with fever up to 101.30F. CT of the abdomen and pelvis revealed pelvic fat and peritoneal thickening with bilateral adnexal ring enhancing collections suggestive of PID with tubo-ovarian abscesses. OB/GYN was consulted emergency department and recommended medical therapy. The patient was started on cefoxitin, doxycycline, and metronidazole. She began having clinical improvement. Unfortunately, the patient continued to have vomiting. Abdominal x-ray showed mild dilated loops of jejunum and upper abdomen with scattered air-fluid levels. There was concern of jejunal ileus or partial obstruction.  CT abdomen and pelvis was performed and was negative for obstruction or ileus.  Assessment/Plan: Sepsis -Presented with fever and leukocytosis -Secondary to PID and tubo-ovarian abscess -Chlamydia DNA probe positive -Continue IV fluids -Continue cefoxitin, doxycycline, metronidazole -am CBC -WBC trending down  PID with tubo-ovarian abscess -HIV and RPR negative -Continue cefoxitin, doxycycline, metronidazole--D#4 -WBC and fevers improving -continue IV morphine prn pain  Intractable vomiting -Check lipase--17 -2 view abdominal x-ray--mild dilated loops of jejunum and upper abdomen with scattered air-fluid levels. There was concern of jejunal ileus or partial obstruction -01/16/16--CT abd/pelvis--negative for obstruction or ileus. Bibasilar opacities, likely atelectasis; unchanged bilateral adnexal fluid collections -Likely secondary to infectious process and opioids -Continue antiemetics -Downgrade diet to full  liquid diet   Atypical chest pain -01/14/2016 CT adjuvant chest negative for pulmonary embolus but showed moderate bibasilar atelectasis -EKG--sinus rhythm, no ST-T wave changes -cycle Troponins--flat, unremarkable  Acute respiratory failure with hypoxia  -Likely due to undiagnosed COPD and hypoventilation secondary to abdominal pain  -Presently stable on 3 L with sat 93-95% -01/14/2016 CT angiogram chest--negative pulmonary embolus; no edema or infiltrates  -incentive spirometer  Diabetes mellitus type 2 -01/14/2016 hemoglobin A1c 10.7 -change to 70/30 insulin due to pt's concern for cost -NovoLog sliding scale  Tobacco abuse -Cessation discussed  Alcohol and marijuana use -Patient uses marijuana 2-3 times per week -She drinks approximately 4 beers on weekends -No signs of withdrawal -Last alcoholic beverage one week prior to admission  [place] Anemia/Fe deficiency -iron/TIBC--iron sat 4 % -RBC folate--1384 -Serum B12--883 -will ultimately need iron supplementation when able to tolerate po  Disposition Plan: Home 7/22 or 7/23 Family Communication: Mother updated on phone 7/20  Consultants: OBGYN  Code Status: FULL  DVT Prophylaxis: [place] Lovenox   Procedures: As Listed in Progress Note Above  Antibiotics: Cefoxitin 01/14/2016>>> Doxycycline 01/14/2016>>> Metronidazole 01/14/2016>>>   Subjective:   Objective: Filed Vitals:   01/16/16 1344 01/16/16 2224 01/17/16 0517 01/17/16 1300  BP: 111/70 135/74 126/79 116/89  Pulse: 80 74 73 65  Temp: 98.8 F (37.1 C) 98 F (36.7 C) 98.4 F (36.9 C) 97.8 F (36.6 C)  TempSrc: Oral Oral Oral Oral  Resp: _0 Height:      Weight:      SpO2: 93% 95% 94% 99%    Intake/Output Summary (Last 24 hours) at 01/17/16 1726 Last data filed at 01/17/16 1328  Gross per 24 hour  Intake   1470 ml  Output   1652 ml  Net   -182 ml   Weight change:  Exam:   General:  Pt is  PROGRESS NOTE  Bianca Yang ZOX:[id number] DOB: 12/24/1975 DOA: 01/13/2016 PCP: [name] Per Patient  Brief History:  40 year old female with a history of diabetes mellitus type 2, tobacco abuse presented with 10 day history of lower abdominal pain with subjective fevers and chills with associated nausea. At the time of presentation, the patient was noted to have WBC 23.3 with fever up to 101.30F. CT of the abdomen and pelvis revealed pelvic fat and peritoneal thickening with bilateral adnexal ring enhancing collections suggestive of PID with tubo-ovarian abscesses. OB/GYN was consulted emergency department and recommended medical therapy. The patient was started on cefoxitin, doxycycline, and metronidazole. She began having clinical improvement. Unfortunately, the patient continued to have vomiting. Abdominal x-ray showed mild dilated loops of jejunum and upper abdomen with scattered air-fluid levels. There was concern of jejunal ileus or partial obstruction.  CT abdomen and pelvis was performed and was negative for obstruction or ileus.  Assessment/Plan: Sepsis -Presented with fever and leukocytosis -Secondary to PID and tubo-ovarian abscess -Chlamydia DNA probe positive -Continue IV fluids -Continue cefoxitin, doxycycline, metronidazole -am CBC -WBC trending down  PID with tubo-ovarian abscess -HIV and RPR negative -Continue cefoxitin, doxycycline, metronidazole--D#4 -WBC and fevers improving -continue IV morphine prn pain  Intractable vomiting -Check lipase--17 -2 view abdominal x-ray--mild dilated loops of jejunum and upper abdomen with scattered air-fluid levels. There was concern of jejunal ileus or partial obstruction -01/16/16--CT abd/pelvis--negative for obstruction or ileus. Bibasilar opacities, likely atelectasis; unchanged bilateral adnexal fluid collections -Likely secondary to infectious process and opioids -Continue antiemetics -Downgrade diet to full  liquid diet   Atypical chest pain -01/14/2016 CT adjuvant chest negative for pulmonary embolus but showed moderate bibasilar atelectasis -EKG--sinus rhythm, no ST-T wave changes -cycle Troponins--flat, unremarkable  Acute respiratory failure with hypoxia  -Likely due to undiagnosed COPD and hypoventilation secondary to abdominal pain  -Presently stable on 3 L with sat 93-95% -01/14/2016 CT angiogram chest--negative pulmonary embolus; no edema or infiltrates  -incentive spirometer  Diabetes mellitus type 2 -01/14/2016 hemoglobin A1c 10.7 -change to 70/30 insulin due to pt's concern for cost -NovoLog sliding scale  Tobacco abuse -Cessation discussed  Alcohol and marijuana use -Patient uses marijuana 2-3 times per week -She drinks approximately 4 beers on weekends -No signs of withdrawal -Last alcoholic beverage one week prior to admission  [place] Anemia/Fe deficiency -iron/TIBC--iron sat 4 % -RBC folate--1384 -Serum B12--883 -will ultimately need iron supplementation when able to tolerate po  Disposition Plan: Home 7/22 or 7/23 Family Communication: Mother updated on phone 7/20  Consultants: OBGYN  Code Status: FULL  DVT Prophylaxis: [place] Lovenox   Procedures: As Listed in Progress Note Above  Antibiotics: Cefoxitin 01/14/2016>>> Doxycycline 01/14/2016>>> Metronidazole 01/14/2016>>>   Subjective:   Objective: Filed Vitals:   01/16/16 1344 01/16/16 2224 01/17/16 0517 01/17/16 1300  BP: 111/70 135/74 126/79 116/89  Pulse: 80 74 73 65  Temp: 98.8 F (37.1 C) 98 F (36.7 C) 98.4 F (36.9 C) 97.8 F (36.6 C)  TempSrc: Oral Oral Oral Oral  Resp: _0 Height:      Weight:      SpO2: 93% 95% 94% 99%    Intake/Output Summary (Last 24 hours) at 01/17/16 1726 Last data filed at 01/17/16 1328  Gross per 24 hour  Intake   1470 ml  Output   1652 ml  Net   -182 ml   Weight change:  Exam:   General:  Pt is  PROGRESS NOTE  Bianca Yang ZOX:[id number] DOB: 12/24/1975 DOA: 01/13/2016 PCP: [name] Per Patient  Brief History:  40 year old female with a history of diabetes mellitus type 2, tobacco abuse presented with 10 day history of lower abdominal pain with subjective fevers and chills with associated nausea. At the time of presentation, the patient was noted to have WBC 23.3 with fever up to 101.30F. CT of the abdomen and pelvis revealed pelvic fat and peritoneal thickening with bilateral adnexal ring enhancing collections suggestive of PID with tubo-ovarian abscesses. OB/GYN was consulted emergency department and recommended medical therapy. The patient was started on cefoxitin, doxycycline, and metronidazole. She began having clinical improvement. Unfortunately, the patient continued to have vomiting. Abdominal x-ray showed mild dilated loops of jejunum and upper abdomen with scattered air-fluid levels. There was concern of jejunal ileus or partial obstruction.  CT abdomen and pelvis was performed and was negative for obstruction or ileus.  Assessment/Plan: Sepsis -Presented with fever and leukocytosis -Secondary to PID and tubo-ovarian abscess -Chlamydia DNA probe positive -Continue IV fluids -Continue cefoxitin, doxycycline, metronidazole -am CBC -WBC trending down  PID with tubo-ovarian abscess -HIV and RPR negative -Continue cefoxitin, doxycycline, metronidazole--D#4 -WBC and fevers improving -continue IV morphine prn pain  Intractable vomiting -Check lipase--17 -2 view abdominal x-ray--mild dilated loops of jejunum and upper abdomen with scattered air-fluid levels. There was concern of jejunal ileus or partial obstruction -01/16/16--CT abd/pelvis--negative for obstruction or ileus. Bibasilar opacities, likely atelectasis; unchanged bilateral adnexal fluid collections -Likely secondary to infectious process and opioids -Continue antiemetics -Downgrade diet to full  liquid diet   Atypical chest pain -01/14/2016 CT adjuvant chest negative for pulmonary embolus but showed moderate bibasilar atelectasis -EKG--sinus rhythm, no ST-T wave changes -cycle Troponins--flat, unremarkable  Acute respiratory failure with hypoxia  -Likely due to undiagnosed COPD and hypoventilation secondary to abdominal pain  -Presently stable on 3 L with sat 93-95% -01/14/2016 CT angiogram chest--negative pulmonary embolus; no edema or infiltrates  -incentive spirometer  Diabetes mellitus type 2 -01/14/2016 hemoglobin A1c 10.7 -change to 70/30 insulin due to pt's concern for cost -NovoLog sliding scale  Tobacco abuse -Cessation discussed  Alcohol and marijuana use -Patient uses marijuana 2-3 times per week -She drinks approximately 4 beers on weekends -No signs of withdrawal -Last alcoholic beverage one week prior to admission  [place] Anemia/Fe deficiency -iron/TIBC--iron sat 4 % -RBC folate--1384 -Serum B12--883 -will ultimately need iron supplementation when able to tolerate po  Disposition Plan: Home 7/22 or 7/23 Family Communication: Mother updated on phone 7/20  Consultants: OBGYN  Code Status: FULL  DVT Prophylaxis: [place] Lovenox   Procedures: As Listed in Progress Note Above  Antibiotics: Cefoxitin 01/14/2016>>> Doxycycline 01/14/2016>>> Metronidazole 01/14/2016>>>   Subjective:   Objective: Filed Vitals:   01/16/16 1344 01/16/16 2224 01/17/16 0517 01/17/16 1300  BP: 111/70 135/74 126/79 116/89  Pulse: 80 74 73 65  Temp: 98.8 F (37.1 C) 98 F (36.7 C) 98.4 F (36.9 C) 97.8 F (36.6 C)  TempSrc: Oral Oral Oral Oral  Resp: _0 Height:      Weight:      SpO2: 93% 95% 94% 99%    Intake/Output Summary (Last 24 hours) at 01/17/16 1726 Last data filed at 01/17/16 1328  Gross per 24 hour  Intake   1470 ml  Output   1652 ml  Net   -182 ml   Weight change:  Exam:   General:  Pt is  PROGRESS NOTE  Bianca Yang ZOX:[id number] DOB: 12/24/1975 DOA: 01/13/2016 PCP: [name] Per Patient  Brief History:  40 year old female with a history of diabetes mellitus type 2, tobacco abuse presented with 10 day history of lower abdominal pain with subjective fevers and chills with associated nausea. At the time of presentation, the patient was noted to have WBC 23.3 with fever up to 101.30F. CT of the abdomen and pelvis revealed pelvic fat and peritoneal thickening with bilateral adnexal ring enhancing collections suggestive of PID with tubo-ovarian abscesses. OB/GYN was consulted emergency department and recommended medical therapy. The patient was started on cefoxitin, doxycycline, and metronidazole. She began having clinical improvement. Unfortunately, the patient continued to have vomiting. Abdominal x-ray showed mild dilated loops of jejunum and upper abdomen with scattered air-fluid levels. There was concern of jejunal ileus or partial obstruction.  CT abdomen and pelvis was performed and was negative for obstruction or ileus.  Assessment/Plan: Sepsis -Presented with fever and leukocytosis -Secondary to PID and tubo-ovarian abscess -Chlamydia DNA probe positive -Continue IV fluids -Continue cefoxitin, doxycycline, metronidazole -am CBC -WBC trending down  PID with tubo-ovarian abscess -HIV and RPR negative -Continue cefoxitin, doxycycline, metronidazole--D#4 -WBC and fevers improving -continue IV morphine prn pain  Intractable vomiting -Check lipase--17 -2 view abdominal x-ray--mild dilated loops of jejunum and upper abdomen with scattered air-fluid levels. There was concern of jejunal ileus or partial obstruction -01/16/16--CT abd/pelvis--negative for obstruction or ileus. Bibasilar opacities, likely atelectasis; unchanged bilateral adnexal fluid collections -Likely secondary to infectious process and opioids -Continue antiemetics -Downgrade diet to full  liquid diet   Atypical chest pain -01/14/2016 CT adjuvant chest negative for pulmonary embolus but showed moderate bibasilar atelectasis -EKG--sinus rhythm, no ST-T wave changes -cycle Troponins--flat, unremarkable  Acute respiratory failure with hypoxia  -Likely due to undiagnosed COPD and hypoventilation secondary to abdominal pain  -Presently stable on 3 L with sat 93-95% -01/14/2016 CT angiogram chest--negative pulmonary embolus; no edema or infiltrates  -incentive spirometer  Diabetes mellitus type 2 -01/14/2016 hemoglobin A1c 10.7 -change to 70/30 insulin due to pt's concern for cost -NovoLog sliding scale  Tobacco abuse -Cessation discussed  Alcohol and marijuana use -Patient uses marijuana 2-3 times per week -She drinks approximately 4 beers on weekends -No signs of withdrawal -Last alcoholic beverage one week prior to admission  [place] Anemia/Fe deficiency -iron/TIBC--iron sat 4 % -RBC folate--1384 -Serum B12--883 -will ultimately need iron supplementation when able to tolerate po  Disposition Plan: Home 7/22 or 7/23 Family Communication: Mother updated on phone 7/20  Consultants: OBGYN  Code Status: FULL  DVT Prophylaxis: [place] Lovenox   Procedures: As Listed in Progress Note Above  Antibiotics: Cefoxitin 01/14/2016>>> Doxycycline 01/14/2016>>> Metronidazole 01/14/2016>>>   Subjective:   Objective: Filed Vitals:   01/16/16 1344 01/16/16 2224 01/17/16 0517 01/17/16 1300  BP: 111/70 135/74 126/79 116/89  Pulse: 80 74 73 65  Temp: 98.8 F (37.1 C) 98 F (36.7 C) 98.4 F (36.9 C) 97.8 F (36.6 C)  TempSrc: Oral Oral Oral Oral  Resp: _0 Height:      Weight:      SpO2: 93% 95% 94% 99%    Intake/Output Summary (Last 24 hours) at 01/17/16 1726 Last data filed at 01/17/16 1328  Gross per 24 hour  Intake   1470 ml  Output   1652 ml  Net   -182 ml   Weight change:  Exam:   General:  Pt is

## 2016-01-17 NOTE — Progress Notes (Addendum)
Inpatient Diabetes Program Recommendations  AACE/ADA: New Consensus Statement on Inpatient Glycemic Control (2015)  Target Ranges:  Prepandial:   less than 140 mg/dL      Peak postprandial:   less than 180 mg/dL (1-2 hours)      Critically ill patients:  140 - 180 mg/dL   Lab Results  Component Value Date   GLUCAP 208* 01/17/2016   HGBA1C 10.7* 01/14/2016    Review of Glycemic Control:  Results for HARTLEIGH, EDMONSTON (MRN [id number]) as of 01/17/2016 09:09  Ref. Range 01/16/2016 07:45 01/16/2016 12:00 01/16/2016 16:49 01/16/2016 22:26 01/17/2016 08:03  Glucose-Capillary Latest Ref Range: 65-99 mg/dL 222 (H) 166 (H) 237 (H) 229 (H) 208 (H)   Diabetes history: Type 2 diabetes Outpatient Diabetes medications: None Current orders for Inpatient glycemic control:  Levemir 20 units q HS, Novolog moderate tid with meals and HS  Inpatient Diabetes Program Recommendations:   Please consider increasing Levemir to 26 units q HS.  Based on A1C, patient would benefit from insulin at discharge.  However patient has not been willing to discuss or participate in teaching.  May need oral agents instead if patient unwilling to d/c on insulin.  Thanks, Adah Perl, RN, BC-ADM Inpatient Diabetes Coordinator Pager [phone] (8a-5p)   11:45 addendum: spoke with patient regarding diabetes.  She states that she doesn't take her medications b/c she can't afford to go to the Dr.  Discussed [facility] and she states, I don't even have 20$ for a visit.  She states she is willing to take medications if she can afford them and see the Dr.  She asked about the "[employer]".  Spoke with Dr. Carles Collet, and he states that patient will d/c on 70/30.  Will order insulin starter kit for patient.  Asked RN to do insulin teaching with patient at the bedside as well.  Needs close follow-up with PCP.   Thanks,   Adah Perl, RN, BC-ADM Inpatient Diabetes Coordinator Pager [phone]

## 2016-01-18 LAB — BASIC METABOLIC PANEL
ANION GAP: 8 (ref 5–15)
BUN: <5 mg/dL — ABNORMAL LOW (ref 6–20)
CALCIUM: 9.4 mg/dL (ref 8.9–10.3)
CO2: 35 mmol/L — AB (ref 22–32)
CREATININE: 0.43 mg/dL — AB (ref 0.44–1.00)
Chloride: 97 mmol/L — ABNORMAL LOW (ref 101–111)
Glucose, Bld: 215 mg/dL — ABNORMAL HIGH (ref 65–99)
Potassium: 3.7 mmol/L (ref 3.5–5.1)
SODIUM: 140 mmol/L (ref 135–145)

## 2016-01-18 LAB — CBC
HEMATOCRIT: 36.1 % (ref 36.0–46.0)
HEMOGLOBIN: 10.9 g/dL — AB (ref 12.0–15.0)
MCH: 27.6 pg (ref 26.0–34.0)
MCHC: 30.2 g/dL (ref 30.0–36.0)
MCV: 91.4 fL (ref 78.0–100.0)
PLATELETS: 308 10*3/uL (ref 150–400)
RBC: 3.95 MIL/uL (ref 3.87–5.11)
RDW: 13.5 % (ref 11.5–15.5)
WBC: 11.5 10*3/uL — AB (ref 4.0–10.5)

## 2016-01-18 LAB — GLUCOSE, CAPILLARY
Glucose-Capillary: 224 mg/dL — ABNORMAL HIGH (ref 65–99)
Glucose-Capillary: 78 mg/dL (ref 65–99)

## 2016-01-18 MED ORDER — METRONIDAZOLE 500 MG PO TABS: 500.0000 mg | ORAL_TABLET | Freq: Three times a day (TID) | ORAL | Status: DC

## 2016-01-18 MED ORDER — INSULIN STARTER KIT- SYRINGES (ENGLISH): 1.0000 | Freq: Once | Status: DC

## 2016-01-18 MED ORDER — DOXYCYCLINE HYCLATE 100 MG PO TABS: 100.0000 mg | ORAL_TABLET | Freq: Two times a day (BID) | ORAL | Status: DC

## 2016-01-18 MED ORDER — FERROUS SULFATE 325 (65 FE) MG PO TABS: 325.0000 mg | ORAL_TABLET | Freq: Two times a day (BID) | ORAL | Status: DC

## 2016-01-18 MED ORDER — INSULIN ASPART PROT & ASPART (70-30 MIX) 100 UNIT/ML ~~LOC~~ SUSP: 18.0000 [IU] | Freq: Two times a day (BID) | SUBCUTANEOUS | Status: DC

## 2016-01-18 MED ORDER — CEFUROXIME AXETIL 500 MG PO TABS: 500.0000 mg | ORAL_TABLET | Freq: Two times a day (BID) | ORAL | Status: DC

## 2016-01-18 NOTE — Progress Notes (Signed)
Patient discharged to home. VSS. AVS given to patient , patient agreed and verbalized understanding. Patient d/c'd accompanied by a friend. No further questions at this moment.  Jesusita Oka n 01/18/2016 2:46 PM

## 2016-01-18 NOTE — Discharge Summary (Signed)
Physician Discharge Summary  Bianca Yang XLK:[id number] DOB: 27-Nov-1975 DOA: 01/13/2016  PCP: [name] Per Patient  Admit date: 01/13/2016 Discharge date: 01/18/2016  Admitted From:  HOME Disposition:  HOME  Recommendations for Outpatient Follow-up:  1. Follow up with PCP in 1-2 weeks 2. Please obtain BMP/CBC in one week  Home Health:No Equipment/Devices:none  Discharge Condition:stable CODE STATUS:FULL Diet recommendation: Carb Modified   Brief/Interim Summary: 40 year old female with a history of diabetes mellitus type 2, tobacco abuse presented with 10 day history of lower abdominal pain with subjective fevers and chills with associated nausea. At the time of presentation, the patient was noted to have WBC 23.3 with fever up to 101.20F. CT of the abdomen and pelvis revealed pelvic fat and peritoneal thickening with bilateral adnexal ring enhancing collections suggestive of PID with tubo-ovarian abscesses. OB/GYN was consulted emergency department and recommended medical therapy. The patient was started on cefoxitin, doxycycline, and metronidazole. She began having clinical improvement. Unfortunately, the patient continued to have vomiting. Abdominal x-ray showed mild dilated loops of jejunum and upper abdomen with scattered air-fluid levels. There was concern of jejunal ileus or partial obstruction. CT abdomen and pelvis was performed and was negative for obstruction or ileus.  Discharge Diagnoses:  Sepsis -Presented with fever and leukocytosis -Secondary to PID and tubo-ovarian abscess -Chlamydia DNA probe positive -Continue IV fluids -Continue cefoxitin, doxycycline, metronidazole during the hospitalization -WBC trending down--WBC 11.5 on day of dc  PID with tubo-ovarian abscess -HIV and RPR negative -Continue cefoxitin, doxycycline, metronidazole--D#4 -WBC and fevers improving -continue IV morphine prn pain -home with cefuroxime, doxy, metronidazole x 10 more days to  complete 14 days of tx  Intractable vomiting -Check lipase--17 -2 view abdominal x-ray--mild dilated loops of jejunum and upper abdomen with scattered air-fluid levels. There was concern of jejunal ileus or partial obstruction -01/16/16--CT abd/pelvis--negative for obstruction or ileus. Bibasilar opacities, likely atelectasis; unchanged bilateral adnexal fluid collections -Likely secondary to infectious process and opioids -Continue antiemetics -Downgrade diet to full liquid diet initially-->improved and tolerated carb modified diet   Atypical chest pain -01/14/2016 CT adjuvant chest negative for pulmonary embolus but showed moderate bibasilar atelectasis -EKG--sinus rhythm, no ST-T wave changes -cycle Troponins--flat, unremarkable  Acute respiratory failure with hypoxia  -Likely due to undiagnosed COPD and hypoventilation secondary to abdominal pain  -Presently stable on 3 L with sat 93-95% -01/14/2016 CT angiogram chest--negative pulmonary embolus; no edema or infiltrates  -incentive spirometer -at time of d/c oxygenation improved with saturation 98-99% on RA  Diabetes mellitus type 2 -01/14/2016 hemoglobin A1c 10.7 -change to 70/30 insulin 18 units bid due to pt's concern for cost -NovoLog sliding scale  Tobacco abuse -Cessation discussed  Alcohol and marijuana use -Patient uses marijuana 2-3 times per week -She drinks approximately 4 beers on weekends -No signs of withdrawal -Last alcoholic beverage one week prior to admission  [place] Anemia/Fe deficiency -iron/TIBC--iron sat 4 % -RBC folate--1384 -Serum B12--883 -home with iron supplementation    Discharge Instructions      Discharge Instructions    Diet Carb Modified    Complete by:  As directed      Increase activity slowly    Complete by:  As directed             Medication List    STOP taking these medications        amoxicillin-clavulanate 875-125 MG tablet  Commonly known as:  AUGMENTIN      ibuprofen 200 MG tablet  Commonly known as:  ADVIL,MOTRIN  ibuprofen 800 MG tablet  Commonly known as:  ADVIL,MOTRIN     predniSONE 20 MG tablet  Commonly known as:  DELTASONE      TAKE these medications        cefUROXime 500 MG tablet  Commonly known as:  CEFTIN  Take 1 tablet (500 mg total) by mouth 2 (two) times daily with a meal.     doxycycline 100 MG tablet  Commonly known as:  VIBRA-TABS  Take 1 tablet (100 mg total) by mouth every 12 (twelve) hours.     ferrous sulfate 325 (65 FE) MG tablet  Take 1 tablet (325 mg total) by mouth 2 (two) times daily with a meal.     insulin aspart protamine- aspart (70-30) 100 UNIT/ML injection  Commonly known as:  NOVOLOG MIX 70/30  Inject 0.18 mLs (18 Units total) into the skin 2 (two) times daily with a meal.     insulin starter kit- syringes Misc  1 kit by Other route once.     metroNIDAZOLE 500 MG tablet  Commonly known as:  FLAGYL  Take 1 tablet (500 mg total) by mouth every 8 (eight) hours.     oxyCODONE-acetaminophen 7.5-325 MG tablet  Commonly known as:  PERCOCET  Take 1 tablet by mouth every 4 (four) hours as needed for severe pain.       Follow-up Information    Schedule an appointment as soon as possible for a visit with [facility].   Contact information:   [address] [place] [address] [phone]     Allergies  Allergen Reactions  . Sulfa Antibiotics Other (See Comments)    Unknown/ childhood allergy.     Consultations:  OBGYN   Procedures/Studies: Ct Angio Chest Pe W Or Wo Contrast  01/14/2016  CLINICAL DATA:  Hypoxia. Pain with deep breath in the right shoulder. EXAM: CT ANGIOGRAPHY CHEST WITH CONTRAST TECHNIQUE: Multidetector CT imaging of the chest was performed using the standard protocol during bolus administration of intravenous contrast. Multiplanar CT image reconstructions and MIPs were obtained to evaluate the vascular anatomy.  CONTRAST:  100 cc Isovue 300 intravenous COMPARISON:  None. FINDINGS: Cardiovascular: Negative for pulmonary embolism. Normal heart size. No pericardial effusion. Mediastinum: Asymmetric enlargement of the left thyroid, suspect underlying 21 mm thyroid nodule. Lungs/Pleura: Moderate bibasilar atelectasis, increased from previous abdominal CT. There is no edema, consolidation, effusion, or pneumothorax. Upper abdomen: Trace fluid seen around the right liver. Bilateral adrenal nodules as described on dedicated abdominal CT. Musculoskeletal: No acute or aggressive process. Review of the MIP images confirms the above findings. IMPRESSION: 1. Negative for pulmonary embolism. 2. Moderate basilar atelectasis, increased from abdominal CT yesterday. 3. Trace perihepatic fluid. Given abdominal CT findings consider perihepatitis as cause of right shoulder pain. 4. Suspect 2 cm left thyroid nodule. Recommend nonemergent sonography. Electronically Signed   By: Marnee Spring M.D.   On: 01/14/2016 05:05   Ct Abdomen Pelvis W Contrast  01/16/2016  CLINICAL DATA:  Low abdominal pain with constipation for 3 days. EXAM: CT ABDOMEN AND PELVIS WITH CONTRAST TECHNIQUE: Multidetector CT imaging of the abdomen and pelvis was performed using the standard protocol following bolus administration of intravenous contrast. CONTRAST:  ISOVUE-300 IOPAMIDOL (ISOVUE-300) INJECTION 61% COMPARISON:  January 13, 2016 CT scan FINDINGS: Increasing bibasilar opacities are identified. The opacity on the left is completely plate lie in thought to be atelectasis. The opacity on the right is a large likely largely atelectatic as  well. A developing infiltrate on the right is not excluded. No other abnormalities are seen in the lung bases. No free air. There is free fluid in the pelvis again identified. The liver, portal vein, and gallbladder are normal. Multiple adrenal nodules it are again identified and unchanged. Most of these nodules are of low  density and there is 1 indeterminate 15 mm nodule on series 2, image 33 with an attenuation of 30 Hounsfield units. The kidneys, spleen, and pancreas are normal. The abdominal aorta is normal in caliber. No adenopathy. The stomach and small bowel are within normal limits. The colon and appendix are normal. Again noted all are bilateral adnexal rim enhancing collections. These collections are not significantly changed in size in the interval and may be adjacent to the right ovary. The left-sided collections are intra ovarian and also unchanged. There is significant surrounding fat stranding. The uterus is normal. The small amount of free fluid. No other abnormalities in the pelvis. Delayed images demonstrate no filling defects in the upper renal collecting systems. Visualized bones are within normal limits. IMPRESSION: 1. Pelvic inflammation with bilateral rim enhancing adnexal collections consistent with PID and tubo-ovarian abscesses, unchanged in the interval. Again, please correlate with a pelvic exam and beta HCG. 2. Bilateral adrenal nodules, unchanged. 3. Increasing opacity in the bases, likely atelectasis on the left. The majority of the opacity is probably atelectasis on the right as well but developing infiltrate is not excluded on the right. Recommend attention on follow-up. Electronically Signed   By: Gerome Sam III M.D   On: 01/16/2016 21:48   Ct Abdomen Pelvis W Contrast  01/13/2016  CLINICAL DATA:  Lower abdominal tenderness for 1 week. Nausea. History of tubal ligation. EXAM: CT ABDOMEN AND PELVIS WITH CONTRAST TECHNIQUE: Multidetector CT imaging of the abdomen and pelvis was performed using the standard protocol following bolus administration of intravenous contrast. CONTRAST:  ISOVUE-300 IOPAMIDOL (ISOVUE-300) INJECTION 61% COMPARISON:  None. FINDINGS: Lower chest and abdominal wall:  No contributory findings. Hepatobiliary: Probable hepatic steatosis. No focal finding.No evidence of  biliary obstruction or stone. Pancreas: Unremarkable. Spleen: Unremarkable. Adrenals/Urinary Tract: Bilateral low-density adrenal nodules, 14 mm on the left and up to 2 cm on the right. Smaller denser nodule present on the right measures 16 mm. No hydronephrosis or stone. Unremarkable bladder. Stomach/Bowel:  No obstruction. No appendicitis. Reproductive:Pelvic fat inflammation and peritoneal thickening with bilateral adnexal rim enhancing collections. The 2 right-sided cystic collections measure up to 46 mm and 27 mm. Left-sided collections measure up to 38 mm with this larger collection appearing intra-ovarian. No typical tubular fluid collection for hydrosalpinx or pyosalpinx. Unremarkable appearance of the uterus. Patient has positive istat HCG exam but subsequent negative laboratory result. Anticipate follow-up BHCG in this patient with tubal ligation. Vascular/Lymphatic: No acute vascular abnormality. No mass or adenopathy. Other: No ascites or pneumoperitoneum. Musculoskeletal: No acute abnormalities. IMPRESSION: 1. Pelvic inflammation with bilateral rim enhancing adnexal collections consistent with PID and tubo-ovarian abscess. The larger is on the right at 5 cm. Please correlate with pelvic exam and quantitative beta HCG. 2. Bilateral adrenal nodules, in the absence of malignancy history statistically adenomas. Electronically Signed   By: Marnee Spring M.D.   On: 01/13/2016 22:17   Dg Chest Port 1 View  01/14/2016  CLINICAL DATA:  Dyspnea EXAM: PORTABLE CHEST 1 VIEW COMPARISON:  01/07/2015 FINDINGS: Streaky bibasilar opacity, increased from previous abdominal CT. Lung volumes are low. No edema or effusion. The lateral right costophrenic sulcus is excluded  from view. Normal heart size and mediastinal contours. IMPRESSION: Low volumes with bibasilar atelectasis, increased from earlier abdominal CT. Electronically Signed   By: Marnee Spring M.D.   On: 01/14/2016 02:49   Dg Abd 2 Views  01/15/2016   CLINICAL DATA:  Intractable vomiting. EXAM: ABDOMEN - 2 VIEW COMPARISON:  Abdomen pelvis CT dated 01/13/2016. Chest CTA dated 01/14/2016. FINDINGS: Mildly dilated loops of jejunum in the left mid upper abdomen. Scattered air-fluid levels. Normal caliber colon with mildly prominent stool in the right and proximal transverse colon. No free peritoneal air. Minimal scoliosis and mild lower thoracic spine degenerative changes. Bibasilar atelectasis. IMPRESSION: Mild jejunal ileus or partial obstruction. Electronically Signed   By: Beckie Salts M.D.   On: 01/15/2016 16:28        Discharge Exam: Filed Vitals:   01/17/16 2136 01/18/16 0618  BP: 121/61 109/52  Pulse: 72 73  Temp: 98 F (36.7 C) 98.6 F (37 C)  Resp: 18 18   Filed Vitals:   01/17/16 0517 01/17/16 1300 01/17/16 2136 01/18/16 0618  BP: 126/79 116/89 121/61 109/52  Pulse: 73 65 72 73  Temp: 98.4 F (36.9 C) 97.8 F (36.6 C) 98 F (36.7 C) 98.6 F (37 C)  TempSrc: Oral Oral Oral Oral  Resp: 18 18 18 18   Height:      Weight:      SpO2: 94% 99% 95% 97%    General: Pt is alert, awake, not in acute distress Cardiovascular: RRR, S1/S2 +, no rubs, no gallops Respiratory: CTA bilaterally, no wheezing, no rhonchi Abdominal: Soft, NT, ND, bowel sounds + Extremities: no edema, no cyanosis   The results of significant diagnostics from this hospitalization (including imaging, microbiology, ancillary and laboratory) are listed below for reference.    Significant Diagnostic Studies: Ct Angio Chest Pe W Or Wo Contrast  01/14/2016  CLINICAL DATA:  Hypoxia. Pain with deep breath in the right shoulder. EXAM: CT ANGIOGRAPHY CHEST WITH CONTRAST TECHNIQUE: Multidetector CT imaging of the chest was performed using the standard protocol during bolus administration of intravenous contrast. Multiplanar CT image reconstructions and MIPs were obtained to evaluate the vascular anatomy. CONTRAST:  100 cc Isovue 300 intravenous COMPARISON:  None.  FINDINGS: Cardiovascular: Negative for pulmonary embolism. Normal heart size. No pericardial effusion. Mediastinum: Asymmetric enlargement of the left thyroid, suspect underlying 21 mm thyroid nodule. Lungs/Pleura: Moderate bibasilar atelectasis, increased from previous abdominal CT. There is no edema, consolidation, effusion, or pneumothorax. Upper abdomen: Trace fluid seen around the right liver. Bilateral adrenal nodules as described on dedicated abdominal CT. Musculoskeletal: No acute or aggressive process. Review of the MIP images confirms the above findings. IMPRESSION: 1. Negative for pulmonary embolism. 2. Moderate basilar atelectasis, increased from abdominal CT yesterday. 3. Trace perihepatic fluid. Given abdominal CT findings consider perihepatitis as cause of right shoulder pain. 4. Suspect 2 cm left thyroid nodule. Recommend nonemergent sonography. Electronically Signed   By: Marnee Spring M.D.   On: 01/14/2016 05:05   Ct Abdomen Pelvis W Contrast  01/16/2016  CLINICAL DATA:  Low abdominal pain with constipation for 3 days. EXAM: CT ABDOMEN AND PELVIS WITH CONTRAST TECHNIQUE: Multidetector CT imaging of the abdomen and pelvis was performed using the standard protocol following bolus administration of intravenous contrast. CONTRAST:  ISOVUE-300 IOPAMIDOL (ISOVUE-300) INJECTION 61% COMPARISON:  January 13, 2016 CT scan FINDINGS: Increasing bibasilar opacities are identified. The opacity on the left is completely plate lie in thought to be atelectasis. The opacity on the right is  a large likely largely atelectatic as well. A developing infiltrate on the right is not excluded. No other abnormalities are seen in the lung bases. No free air. There is free fluid in the pelvis again identified. The liver, portal vein, and gallbladder are normal. Multiple adrenal nodules it are again identified and unchanged. Most of these nodules are of low density and there is 1 indeterminate 15 mm nodule on series 2,  image 33 with an attenuation of 30 Hounsfield units. The kidneys, spleen, and pancreas are normal. The abdominal aorta is normal in caliber. No adenopathy. The stomach and small bowel are within normal limits. The colon and appendix are normal. Again noted all are bilateral adnexal rim enhancing collections. These collections are not significantly changed in size in the interval and may be adjacent to the right ovary. The left-sided collections are intra ovarian and also unchanged. There is significant surrounding fat stranding. The uterus is normal. The small amount of free fluid. No other abnormalities in the pelvis. Delayed images demonstrate no filling defects in the upper renal collecting systems. Visualized bones are within normal limits. IMPRESSION: 1. Pelvic inflammation with bilateral rim enhancing adnexal collections consistent with PID and tubo-ovarian abscesses, unchanged in the interval. Again, please correlate with a pelvic exam and beta HCG. 2. Bilateral adrenal nodules, unchanged. 3. Increasing opacity in the bases, likely atelectasis on the left. The majority of the opacity is probably atelectasis on the right as well but developing infiltrate is not excluded on the right. Recommend attention on follow-up. Electronically Signed   By: Gerome Sam III M.D   On: 01/16/2016 21:48   Ct Abdomen Pelvis W Contrast  01/13/2016  CLINICAL DATA:  Lower abdominal tenderness for 1 week. Nausea. History of tubal ligation. EXAM: CT ABDOMEN AND PELVIS WITH CONTRAST TECHNIQUE: Multidetector CT imaging of the abdomen and pelvis was performed using the standard protocol following bolus administration of intravenous contrast. CONTRAST:  ISOVUE-300 IOPAMIDOL (ISOVUE-300) INJECTION 61% COMPARISON:  None. FINDINGS: Lower chest and abdominal wall:  No contributory findings. Hepatobiliary: Probable hepatic steatosis. No focal finding.No evidence of biliary obstruction or stone. Pancreas: Unremarkable. Spleen:  Unremarkable. Adrenals/Urinary Tract: Bilateral low-density adrenal nodules, 14 mm on the left and up to 2 cm on the right. Smaller denser nodule present on the right measures 16 mm. No hydronephrosis or stone. Unremarkable bladder. Stomach/Bowel:  No obstruction. No appendicitis. Reproductive:Pelvic fat inflammation and peritoneal thickening with bilateral adnexal rim enhancing collections. The 2 right-sided cystic collections measure up to 46 mm and 27 mm. Left-sided collections measure up to 38 mm with this larger collection appearing intra-ovarian. No typical tubular fluid collection for hydrosalpinx or pyosalpinx. Unremarkable appearance of the uterus. Patient has positive istat HCG exam but subsequent negative laboratory result. Anticipate follow-up BHCG in this patient with tubal ligation. Vascular/Lymphatic: No acute vascular abnormality. No mass or adenopathy. Other: No ascites or pneumoperitoneum. Musculoskeletal: No acute abnormalities. IMPRESSION: 1. Pelvic inflammation with bilateral rim enhancing adnexal collections consistent with PID and tubo-ovarian abscess. The larger is on the right at 5 cm. Please correlate with pelvic exam and quantitative beta HCG. 2. Bilateral adrenal nodules, in the absence of malignancy history statistically adenomas. Electronically Signed   By: Marnee Spring M.D.   On: 01/13/2016 22:17   Dg Chest Port 1 View  01/14/2016  CLINICAL DATA:  Dyspnea EXAM: PORTABLE CHEST 1 VIEW COMPARISON:  01/07/2015 FINDINGS: Streaky bibasilar opacity, increased from previous abdominal CT. Lung volumes are low. No edema or effusion. The  lateral right costophrenic sulcus is excluded from view. Normal heart size and mediastinal contours. IMPRESSION: Low volumes with bibasilar atelectasis, increased from earlier abdominal CT. Electronically Signed   By: Marnee Spring M.D.   On: 01/14/2016 02:49   Dg Abd 2 Views  01/15/2016  CLINICAL DATA:  Intractable vomiting. EXAM: ABDOMEN - 2 VIEW  COMPARISON:  Abdomen pelvis CT dated 01/13/2016. Chest CTA dated 01/14/2016. FINDINGS: Mildly dilated loops of jejunum in the left mid upper abdomen. Scattered air-fluid levels. Normal caliber colon with mildly prominent stool in the right and proximal transverse colon. No free peritoneal air. Minimal scoliosis and mild lower thoracic spine degenerative changes. Bibasilar atelectasis. IMPRESSION: Mild jejunal ileus or partial obstruction. Electronically Signed   By: Beckie Salts M.D.   On: 01/15/2016 16:28     Microbiology: Recent Results (from the past 240 hour(s))  Wet prep, genital     Status: Abnormal   Collection Time: 01/13/16 11:10 PM  Result Value Ref Range Status   Yeast Wet Prep HPF POC NONE SEEN NONE SEEN Final   Trich, Wet Prep NONE SEEN NONE SEEN Final   Clue Cells Wet Prep HPF POC PRESENT (A) NONE SEEN Final   WBC, Wet Prep HPF POC MANY (A) NONE SEEN Final   Sperm NONE SEEN  Final     Labs: Basic Metabolic Panel:  Recent Labs Lab 01/14/16 0215 01/15/16 0220 01/16/16 0152 01/17/16 0215 01/18/16 0250  NA 132* 135 133* 140 140  K 3.3* 3.6 3.9 4.4 3.7  CL 89* 90* 89* 94* 97*  CO2 34* 38* 40* 41* 35*  GLUCOSE 317* 201* 295* 279* 215*  BUN <5* <5* <5* <5* <5*  CREATININE 0.63 0.63 0.78 0.67 0.43*  CALCIUM 8.0* 8.4* 8.6* 9.3 9.4   Liver Function Tests:  Recent Labs Lab 01/13/16 1403 01/15/16 0220  AST 12* 10*  ALT 12* 9*  ALKPHOS 116 98  BILITOT 0.8 0.3  PROT 7.2 6.4*  ALBUMIN 3.3* 2.4*    Recent Labs Lab 01/13/16 1403 01/15/16 1440  LIPASE 16 17   No results for input(s): AMMONIA in the last 168 hours. CBC:  Recent Labs Lab 01/14/16 0215 01/15/16 0220 01/15/16 1440 01/16/16 0152 01/17/16 0215 01/18/16 0250  WBC 19.4* 18.5*  --  15.5* 12.1* 11.5*  HGB 12.1 10.9*  --  10.5* 11.2* 10.9*  HCT 38.8 37.1 36.3 35.9* 37.8 36.1  MCV 90.4 92.8  --  94.0 93.1 91.4  PLT 278 272  --  269 306 308   Cardiac Enzymes:  Recent Labs Lab 01/15/16 1440  01/15/16 1848 01/16/16 0152  TROPONINI 0.04* <0.03 <0.03   BNP: Invalid input(s): POCBNP CBG:  Recent Labs Lab 01/17/16 1303 01/17/16 1727 01/17/16 2138 01/18/16 0739 01/18/16 1145  GLUCAP 282* 139* 237* 224* 78    Time coordinating discharge:  Greater than 30 minutes  Signed:  Prashant Glosser, DO [facility] Pager: [phone] 01/18/2016, 12:55 PM

## 2016-01-18 NOTE — Progress Notes (Signed)
Offered pt. A bath and pt. Stated she is hoping to go home today so she did not wanted to take a bath

## 2016-01-20 MED FILL — DOXYCYCLINE 100 MG TABLET: 100 | 10 days supply | Qty: 20 | Fill #0

## 2016-01-20 MED FILL — BD INSUL SYR 0.5 ML 31GX15/: 31G X 15/64 | 30 days supply | Qty: 100 | Fill #0

## 2016-01-20 MED FILL — TRUEplus LANCETS 28G MISC: 30 days supply | Qty: 100 | Fill #0

## 2016-01-20 MED FILL — NOVOLOG MIX 70/30 VIAL: (70-30) 100 | 27 days supply | Qty: 10 | Fill #0

## 2016-01-20 MED FILL — metroNIDAZOLE 500 MG TABS: 500 | 10 days supply | Qty: 30 | Fill #0

## 2016-01-20 MED FILL — CEFUROXIME AXETIL 500 MG TA: 500 | 10 days supply | Qty: 20 | Fill #0

## 2016-01-20 MED FILL — !TRUE METRIX BLOOD GLUCOSE: 1 days supply | Qty: 1 | Fill #0

## 2016-01-20 MED FILL — TRUE METRIX TEST STRIP: 30 days supply | Qty: 100 | Fill #0

## 2016-01-24 ENCOUNTER — Ambulatory Visit: Payer: Self-pay | Attending: Internal Medicine | Admitting: Physician Assistant

## 2016-01-24 VITALS — BP 102/68 | HR 88 | Temp 98.2°F | Resp 16 | Wt 205.6 lb

## 2016-01-24 DIAGNOSIS — E119 Type 2 diabetes mellitus without complications: Secondary | ICD-10-CM

## 2016-01-24 DIAGNOSIS — E669 Obesity, unspecified: Secondary | ICD-10-CM

## 2016-01-24 DIAGNOSIS — A749 Chlamydial infection, unspecified: Secondary | ICD-10-CM

## 2016-01-24 DIAGNOSIS — D509 Iron deficiency anemia, unspecified: Secondary | ICD-10-CM

## 2016-01-24 DIAGNOSIS — E1169 Type 2 diabetes mellitus with other specified complication: Secondary | ICD-10-CM

## 2016-01-24 DIAGNOSIS — B373 Candidiasis of vulva and vagina: Secondary | ICD-10-CM

## 2016-01-24 DIAGNOSIS — B3731 Acute candidiasis of vulva and vagina: Secondary | ICD-10-CM

## 2016-01-24 LAB — CBC WITH DIFFERENTIAL/PLATELET
BASOS PCT: 0 %
Basophils Absolute: 0 cells/uL (ref 0–200)
EOS ABS: 309 {cells}/uL (ref 15–500)
EOS PCT: 3 %
HCT: 42 % (ref 35.0–45.0)
Hemoglobin: 13.2 g/dL (ref 11.7–15.5)
LYMPHS PCT: 23 %
Lymphs Abs: 2369 cells/uL (ref 850–3900)
MCH: 28.1 pg (ref 27.0–33.0)
MCHC: 31.4 g/dL — ABNORMAL LOW (ref 32.0–36.0)
MCV: 89.4 fL (ref 80.0–100.0)
MONOS PCT: 7 %
MPV: 10.7 fL (ref 7.5–12.5)
Monocytes Absolute: 721 cells/uL (ref 200–950)
NEUTROS ABS: 6901 {cells}/uL (ref 1500–7800)
Neutrophils Relative %: 67 %
PLATELETS: 444 10*3/uL — AB (ref 140–400)
RBC: 4.7 MIL/uL (ref 3.80–5.10)
RDW: 13.8 % (ref 11.0–15.0)
WBC: 10.3 10*3/uL (ref 3.8–10.8)

## 2016-01-24 MED ORDER — FLUCONAZOLE 150 MG PO TABS: 150.0000 mg | ORAL_TABLET | Freq: Once | ORAL | 0 refills | Status: AC

## 2016-01-24 MED ORDER — FLUCONAZOLE 150 MG PO TABS: 150.0000 mg | ORAL_TABLET | Freq: Once | ORAL | 0 refills | Status: DC

## 2016-01-24 MED FILL — FLUCONAZOLE 150 MG TABLET: 150 | 2 days supply | Qty: 2 | Fill #0

## 2016-01-24 NOTE — Progress Notes (Signed)
Patient ID: Bianca Yang, female   DOB: [birth year], 40 y.o.   MRN: [id number]   Bianca Yang, is a 40 y.o. female  JAS:[id number]  ALP:[id number]  DOB - 04/23/1976  Subjective:  Chief Complaint and HPI: Bianca Yang is a 40 y.o. female here today to establish care and for a follow up visit after being hospitalized(01/13/2016-01/18/2016) for PID/Chlamydia/ and uncontrolled diabetes. Her pain has resolved.  She says she has only had trouble with diabetes this past year, but there are blood sugars of >200 in 2012.  She was discharged on Novolog 70/30 18 units bid.  She has been checking her blood sugars at home and her readings have been >100.  She did not bring in a record of her home readings.  She is trying to make better food choices. No more f/c/abdominal pain.    ED and Hospital notes reviewed.    ROS:   Constitutional:  No f/c, No night sweats, No unexplained weight loss. EENT:  No vision changes, No blurry vision, No hearing changes. No mouth, throat, or ear problems.  Respiratory: No cough, No SOB Cardiac: No CP, no palpitations GI:  No abd pain, No N/V/D. GU: No Urinary s/sx Musculoskeletal: No joint pain Neuro: No headache, no dizziness, no motor weakness.  Skin: No rash Endocrine:  No polydipsia. No polyuria.  Psych: Denies SI/HI  No problems updated.  ALLERGIES: Allergies  Allergen Reactions  . Sulfa Antibiotics Other (See Comments)    Unknown/ childhood allergy.     PAST MEDICAL HISTORY: Past Medical History:  Diagnosis Date  . Diabetes mellitus without complication ([place])     MEDICATIONS AT HOME: Prior to Admission medications   Medication Sig Start Date End Date Taking? Authorizing Provider  cefUROXime (CEFTIN) 500 MG tablet Take 1 tablet (500 mg total) by mouth 2 (two) times daily with a meal. 01/18/16  Yes [name], MD  doxycycline (VIBRA-TABS) 100 MG tablet Take 1 tablet (100 mg total) by mouth every 12 (twelve) hours. 01/18/16  Yes [name], MD    ferrous sulfate 325 (65 FE) MG tablet Take 1 tablet (325 mg total) by mouth 2 (two) times daily with a meal. 01/18/16  Yes [name], MD  insulin aspart protamine- aspart (NOVOLOG MIX 70/30) (70-30) 100 UNIT/ML injection Inject 0.18 mLs (18 Units total) into the skin 2 (two) times daily with a meal. 01/18/16  Yes [name], MD  insulin starter kit- syringes MISC 1 kit by Other route once. 01/18/16  Yes [name], MD  metroNIDAZOLE (FLAGYL) 500 MG tablet Take 1 tablet (500 mg total) by mouth every 8 (eight) hours. 01/18/16  Yes [name], MD  fluconazole (DIFLUCAN) 150 MG tablet Take 1 tablet (150 mg total) by mouth once. Repeat in 1 week 01/24/16 01/24/16  Argentina Donovan, PA-C  oxyCODONE-acetaminophen (PERCOCET) 7.5-325 MG tablet Take 1 tablet by mouth every 4 (four) hours as needed for severe pain. Patient not taking: Reported on 01/24/2016 09/15/15   Ashley Murrain, NP     Objective:  EXAM:   Vitals:   01/24/16 1425  BP: 102/68  Pulse: 88  Resp: 16  Temp: 98.2 F (36.8 C)  TempSrc: Oral  SpO2: 93%  Weight: 205 lb 9.6 oz (93.3 kg)    General appearance : A&OX3. NAD. Non-toxic-appearing HEENT: Atraumatic and Normocephalic.  PERRLA. EOM intact.  TM clear B. Mouth-MMM, post pharynx WNL w/o erythema, No PND. Neck: supple, no JVD. No cervical lymphadenopathy. No thyromegaly Chest/Lungs:  Breathing-non-labored, Good  air entry bilaterally, breath sounds normal without rales, rhonchi, or wheezing  CVS: S1 S2 regular, no murmurs, gallops, rubs  Abdomen: Bowel sounds present, Non tender and not distended with no gaurding, rigidity or rebound. Neurology:  CN II-XII grossly intact, Non focal.   Psych:  TP linear. J/I WNL. Normal speech. Appropriate eye contact and affect.  Skin:  No Rash  Data Review Lab Results  Component Value Date   HGBA1C 10.7 (H) 01/14/2016     Assessment & Plan   1. Chlamydia Recent hospital admission/PID - GC/chlamydia probe amp, urine  2. Diabetes mellitus  type 2 in obese (HCC)-uncontrolled - Basic metabolic panel Check blood sugars 3X daily and record reading and time of day. Preferably check sugar fasting daily, 1 hr after a meal, and at bedtime.  Continue current insulin dosing for now-hopefully we can titrate down if CBGs start to improve.  Glucose in office is 309 today.   3. Iron deficiency anemia Continue Iron supplements - CBC with Differential/Platelet  4. Yeast vaginitis S/p rigorous antibiotic therapy - fluconazole (DIFLUCAN) 150 MG tablet; Take 1 tablet (150 mg total) by mouth once. Repeat in 1 week  Dispense: 2 tablet; Refill: 0  Patient have been counseled extensively about nutrition and exercise  Return in about 2 weeks (around 02/07/2016) for diabetic monitoring/management with Nicoletta Ba.  The patient was given clear instructions to go to ER or return to medical center if symptoms don't improve, worsen or new problems develop. The patient verbalized understanding. The patient was told to call to get lab results if they haven't heard anything in the next week.     Freeman Caldron, PA-C [facility], [place]   01/24/2016, 4:58 PM

## 2016-01-24 NOTE — Progress Notes (Signed)
Pt is in the office today for a follow up from ED

## 2016-01-24 NOTE — Patient Instructions (Signed)
Check blood sugars 3X daily and record reading and time of day. Preferably check sugar fasting daily, 1 hr after a meal, and at bedtime

## 2016-01-25 LAB — BASIC METABOLIC PANEL
BUN: 9 mg/dL (ref 7–25)
CALCIUM: 9.6 mg/dL (ref 8.6–10.2)
CHLORIDE: 94 mmol/L — AB (ref 98–110)
CO2: 31 mmol/L (ref 20–31)
CREATININE: 0.58 mg/dL (ref 0.50–1.10)
Glucose, Bld: 356 mg/dL — ABNORMAL HIGH (ref 65–99)
Potassium: 4.7 mmol/L (ref 3.5–5.3)
Sodium: 137 mmol/L (ref 135–146)

## 2016-01-27 LAB — URINE CYTOLOGY ANCILLARY ONLY
CHLAMYDIA, DNA PROBE: NEGATIVE
Neisseria Gonorrhea: NEGATIVE

## 2016-01-29 ENCOUNTER — Telehealth: Payer: Self-pay

## 2016-01-29 NOTE — Telephone Encounter (Signed)
Contacted pt to go over lab results pt is aware of results and will take the antibiotics as discussed with the provider

## 2016-02-06 ENCOUNTER — Ambulatory Visit: Payer: Self-pay

## 2016-02-06 ENCOUNTER — Ambulatory Visit: Payer: Self-pay | Attending: Internal Medicine | Admitting: Pharmacist

## 2016-02-06 DIAGNOSIS — E1169 Type 2 diabetes mellitus with other specified complication: Secondary | ICD-10-CM

## 2016-02-06 DIAGNOSIS — E119 Type 2 diabetes mellitus without complications: Secondary | ICD-10-CM

## 2016-02-06 DIAGNOSIS — E669 Obesity, unspecified: Secondary | ICD-10-CM

## 2016-02-06 MED ORDER — METFORMIN HCL ER 500 MG PO TB24: 500.0000 mg | ORAL_TABLET | Freq: Two times a day (BID) | ORAL | 3 refills | Status: DC

## 2016-02-06 MED FILL — METFORMIN HCL ER 500 MG TAB: 500 | 30 days supply | Qty: 60 | Fill #0

## 2016-02-06 NOTE — Patient Instructions (Signed)
Thanks for coming to see us!  Continue your insulin  Start metformin 500 mg (1 tablet) with breakfast and if you feel ok after a few days, increase to 500 mg (1 tablet) with breakfast and 500 mg (1 tablet) with dinner.   Come back in 2 weeks   Hypoglycemia Low blood sugar (hypoglycemia) means that the level of sugar in your blood is lower than it should be. Signs of low blood sugar include:  Getting sweaty.  Feeling hungry.  Feeling dizzy or weak.  Feeling sleepier than normal.  Feeling nervous.  Headaches.  Having a fast heartbeat. Low blood sugar can happen fast and can be an emergency. Your doctor can do tests to check your blood sugar level. You can have low blood sugar and not have diabetes. HOME CARE  Check your blood sugar as told by your doctor. If it is less than 70 mg/dl or as told by your doctor, take 1 of the following:  3 to 4 glucose tablets.   cup clear juice.   cup soda pop, not diet.  1 cup milk.  5 to 6 hard candies.  Recheck blood sugar after 15 minutes. Repeat until it is at the right level.  Eat a snack if it is more than 1 hour until the next meal.  Only take medicine as told by your doctor.  Do not skip meals. Eat on time.  Do not drink alcohol except with meals.  Check your blood glucose before driving.  Check your blood glucose before and after exercise.  Always carry treatment with you, such as glucose pills.  Always wear a medical alert bracelet if you have diabetes. GET HELP RIGHT AWAY IF:   Your blood glucose goes below 70 mg/dl or as told by your doctor, and you:  Are confused.  Are not able to swallow.  Pass out (faint).  You cannot treat yourself. You may need someone to help you.  You have low blood sugar problems often.  You have problems from your medicines.  You are not feeling better after 3 to 4 days.  You have vision changes. MAKE SURE YOU:   Understand these instructions.  Will watch this  condition.  Will get help right away if you are not doing well or get worse.   This information is not intended to replace advice given to you by your health care provider. Make sure you discuss any questions you have with your health care provider.   Document Released: 09/09/2009 Document Revised: 07/06/2014 Document Reviewed: 02/19/2015 Elsevier Interactive Patient Education [employer]2016 Elsevier Inc.

## 2016-02-06 NOTE — Progress Notes (Signed)
    S:    Patient arrives in good spirit with daughter.  Presents for diabetes evaluation, education, and management at the request of Camera operatorAngela McClung/Dr. Hyman HopesJegede. Patient was referred on 01/24/16.   Patient reports adherence with medications.  Current diabetes medications include: Novolog Mix 70/30 (insulin protamine aspart/aspart) 18units BID with meals  Reports that she has been on metformin IR before but it upset her stomach.   Patient denies hypoglycemic events.  Patient reported dietary habits: Eats 3 meals/day Breakfast: Lemonade and grilled chicken  Drinks: Mostly water.  O:  Lab Results  Component Value Date   HGBA1C 10.7 (H) 01/14/2016   There were no vitals filed for this visit.  Home fasting CBG: 100-120s  2 hour post-prandial/random CBG: <180s  A/P: Diabetes newly diagnosed and currently uncontrolled based on A1c of 10.7. Patient denies hypoglycemic events and is able to verbalize appropriate hypoglycemia management plan. Patient reports adherence with medication. Control is suboptimal due to dietary indiscretion.   Continue Novolog Mix 70/30 Inject 18 units into the skin BID with a meal  Start Metformin ER 500mg  Take one tablet by mouth BID with a meal. Patient counseled on adverse effects and how to take.   Next A1C anticipated October 2017.    Lacks lipid panel to assess statin need.   Written patient instructions provided.  Total time in face to face counseling 15 minutes.   Follow up in Pharmacist Clinic Visit in two weeks.   Patient seen with Tasia CatchingsZach Lingle, PharmD Candidate

## 2016-02-20 ENCOUNTER — Ambulatory Visit: Payer: Self-pay | Attending: Family Medicine | Admitting: Family Medicine

## 2016-02-20 VITALS — BP 122/73 | HR 78 | Temp 98.4°F | Ht 67.0 in | Wt 207.0 lb

## 2016-02-20 DIAGNOSIS — R102 Pelvic and perineal pain: Secondary | ICD-10-CM | POA: Insufficient documentation

## 2016-02-20 DIAGNOSIS — B373 Candidiasis of vulva and vagina: Secondary | ICD-10-CM | POA: Insufficient documentation

## 2016-02-20 DIAGNOSIS — E119 Type 2 diabetes mellitus without complications: Secondary | ICD-10-CM

## 2016-02-20 DIAGNOSIS — B3731 Acute candidiasis of vulva and vagina: Secondary | ICD-10-CM

## 2016-02-20 DIAGNOSIS — E1165 Type 2 diabetes mellitus with hyperglycemia: Secondary | ICD-10-CM | POA: Insufficient documentation

## 2016-02-20 DIAGNOSIS — E1169 Type 2 diabetes mellitus with other specified complication: Secondary | ICD-10-CM

## 2016-02-20 DIAGNOSIS — Z794 Long term (current) use of insulin: Secondary | ICD-10-CM | POA: Insufficient documentation

## 2016-02-20 DIAGNOSIS — E669 Obesity, unspecified: Secondary | ICD-10-CM

## 2016-02-20 DIAGNOSIS — M25562 Pain in left knee: Secondary | ICD-10-CM | POA: Insufficient documentation

## 2016-02-20 LAB — GLUCOSE, POCT (MANUAL RESULT ENTRY): POC GLUCOSE: 234 mg/dL — AB (ref 70–99)

## 2016-02-20 MED ORDER — CANAGLIFLOZIN 100 MG PO TABS: 100.0000 mg | ORAL_TABLET | Freq: Every day | ORAL | 3 refills | Status: DC

## 2016-02-20 MED ORDER — FLUCONAZOLE 150 MG PO TABS: 150.0000 mg | ORAL_TABLET | Freq: Every day | ORAL | 0 refills | Status: DC

## 2016-02-20 MED FILL — FLUCONAZOLE 150 MG TABLET: 150 | 7 days supply | Qty: 7 | Fill #0

## 2016-02-20 NOTE — Patient Instructions (Signed)
Diabetes Mellitus and Food It is important for you to manage your blood sugar (glucose) level. Your blood glucose level can be greatly affected by what you eat. Eating healthier foods in the appropriate amounts throughout the day at about the same time each day will help you control your blood glucose level. It can also help slow or prevent worsening of your diabetes mellitus. Healthy eating may even help you improve the level of your blood pressure and reach or maintain a healthy weight.  General recommendations for healthful eating and cooking habits include:  Eating meals and snacks regularly. Avoid going long periods of time without eating to lose weight.  Eating a diet that consists mainly of plant-based foods, such as fruits, vegetables, nuts, legumes, and whole grains.  Using low-heat cooking methods, such as baking, instead of high-heat cooking methods, such as deep frying. Work with your dietitian to make sure you understand how to use the Nutrition Facts information on food labels. HOW CAN FOOD AFFECT ME? Carbohydrates Carbohydrates affect your blood glucose level more than any other type of food. Your dietitian will help you determine how many carbohydrates to eat at each meal and teach you how to count carbohydrates. Counting carbohydrates is important to keep your blood glucose at a healthy level, especially if you are using insulin or taking certain medicines for diabetes mellitus. Alcohol Alcohol can cause sudden decreases in blood glucose (hypoglycemia), especially if you use insulin or take certain medicines for diabetes mellitus. Hypoglycemia can be a life-threatening condition. Symptoms of hypoglycemia (sleepiness, dizziness, and disorientation) are similar to symptoms of having too much alcohol.  If your health care provider has given you approval to drink alcohol, do so in moderation and use the following guidelines:  Women should not have more than one drink per day, and men  should not have more than two drinks per day. One drink is equal to:  12 oz of beer.  5 oz of wine.  1 oz of hard liquor.  Do not drink on an empty stomach.  Keep yourself hydrated. Have water, diet soda, or unsweetened iced tea.  Regular soda, juice, and other mixers might contain a lot of carbohydrates and should be counted. WHAT FOODS ARE NOT RECOMMENDED? As you make food choices, it is important to remember that all foods are not the same. Some foods have fewer nutrients per serving than other foods, even though they might have the same number of calories or carbohydrates. It is difficult to get your body what it needs when you eat foods with fewer nutrients. Examples of foods that you should avoid that are high in calories and carbohydrates but low in nutrients include:  Trans fats (most processed foods list trans fats on the Nutrition Facts label).  Regular soda.  Juice.  Candy.  Sweets, such as cake, pie, doughnuts, and cookies.  Fried foods. WHAT FOODS CAN I EAT? Eat nutrient-rich foods, which will nourish your body and keep you healthy. The food you should eat also will depend on several factors, including:  The calories you need.  The medicines you take.  Your weight.  Your blood glucose level.  Your blood pressure level.  Your cholesterol level. You should eat a variety of foods, including:  Protein.  Lean cuts of meat.  Proteins low in saturated fats, such as fish, egg whites, and beans. Avoid processed meats.  Fruits and vegetables.  Fruits and vegetables that may help control blood glucose levels, such as apples, mangoes, and   yams.  Dairy products.  Choose fat-free or low-fat dairy products, such as milk, yogurt, and cheese.  Grains, bread, pasta, and rice.  Choose whole grain products, such as multigrain bread, whole oats, and brown rice. These foods may help control blood pressure.  Fats.  Foods containing healthful fats, such as nuts,  avocado, olive oil, canola oil, and fish. DOES EVERYONE WITH DIABETES MELLITUS HAVE THE SAME MEAL PLAN? Because every person with diabetes mellitus is different, there is not one meal plan that works for everyone. It is very important that you meet with a dietitian who will help you create a meal plan that is just right for you.   This information is not intended to replace advice given to you by your health care provider. Make sure you discuss any questions you have with your health care provider.   Document Released: 03/12/2005 Document Revised: 07/06/2014 Document Reviewed: 05/12/2013 Elsevier Interactive Patient Education 2016 Elsevier Inc.  

## 2016-02-20 NOTE — Progress Notes (Signed)
Subjective:  Patient ID: Bianca Yang, female    DOB: Dec 25, 1975  Age: 40 y.o. MRN: [id number]  CC: Diabetes and Vaginitis   HPI Bianca Yang is a 40 year old female with a history of type 2 diabetes mellitus A1c 10.7 who comes into the clinic today complaining of pelvic pain, vaginal itching and burning, left knee pain.  She was hospitalized in 12/2015 for Pelvic inflammatory disease secondary to chlamydia and was on a series of antibiotics which she thinks precipitated the yeast infection. At her last office visit she received a single Diflucan pill which she states did not help. Describes her perineum as feeling raw.  Also complains of left knee pain which is worse with going up and down the stairs and associated crepitus.  With regards to her diabetes mellitus she has been taking metformin but not  NovoLog as she was under the impression that NovoLog has been discontinued once metformin was initiated.  She endorses polyuria.  Past Medical History:  Diagnosis Date  . Diabetes mellitus without complication [facility])     Past Surgical History:  Procedure Laterality Date  . TUBAL LIGATION      Allergies  Allergen Reactions  . Sulfa Antibiotics Other (See Comments)    Unknown/ childhood allergy.       Outpatient Medications Prior to Visit  Medication Sig Dispense Refill  . metFORMIN (GLUCOPHAGE XR) 500 MG 24 hr tablet Take 1 tablet (500 mg total) by mouth 2 (two) times daily with a meal. 60 tablet 3  . insulin aspart protamine- aspart (NOVOLOG MIX 70/30) (70-30) 100 UNIT/ML injection Inject 0.18 mLs (18 Units total) into the skin 2 (two) times daily with a meal. (Patient not taking: Reported on 02/20/2016) 10 mL 1  . insulin starter kit- syringes MISC 1 kit by Other route once. (Patient not taking: Reported on 02/20/2016) 1 kit 0   No facility-administered medications prior to visit.     ROS Review of Systems  Constitutional: Negative for activity change, appetite  change and fatigue.  HENT: Negative for congestion, sinus pressure and sore throat.   Eyes: Negative for visual disturbance.  Respiratory: Negative for cough, chest tightness, shortness of breath and wheezing.   Cardiovascular: Negative for chest pain and palpitations.  Gastrointestinal: Negative for abdominal distention, abdominal pain and constipation.  Endocrine: Negative for polydipsia.  Genitourinary: Positive for pelvic pain and vaginal pain (and itching). Negative for dysuria and frequency.  Musculoskeletal: Negative for arthralgias and back pain.       Left knee pain  Skin: Negative for rash.  Neurological: Negative for tremors, light-headedness and numbness.  Hematological: Does not bruise/bleed easily.  Psychiatric/Behavioral: Negative for agitation and behavioral problems.    Objective:  BP 122/73 (BP Location: Right Arm, Patient Position: Sitting, Cuff Size: Large)   Pulse 78   Temp 98.4 F (36.9 C) (Oral)   Ht 5' 7"  (1.702 m)   Wt 207 lb (93.9 kg)   SpO2 94%   BMI 32.42 kg/m   BP/Weight 02/20/2016 01/24/2016 0/02/3817  Systolic BP 299 371 696  Diastolic BP 73 68 52  Wt. (Lbs) 207 205.6 -  BMI 32.42 34.21 -      Physical Exam  Constitutional: She is oriented to person, place, and time. She appears well-developed and well-nourished.  Obese  Cardiovascular: Normal rate, normal heart sounds and intact distal pulses.   No murmur heard. Pulmonary/Chest: Effort normal and breath sounds normal. She has no wheezes. She has no rales. She exhibits  no tenderness.  Abdominal: Soft. Bowel sounds are normal. She exhibits no distension and no mass. There is no tenderness.  Musculoskeletal: She exhibits tenderness (mild left knee tenderness and crepitus on exam).  Neurological: She is alert and oriented to person, place, and time.     Lab Results  Component Value Date   HGBA1C 10.7 (H) 01/14/2016    Assessment & Plan:   1. Diabetes mellitus type 2 in obese  ([place]) Uncontrolled with A1c of 10.7 I have explained to her that hyperglycemia could predispose her to polyuria and recurrent yeast infections Invokana added to regimen as she would like to hold off on insulin as long as possible If she still remains hyperglycemic I have explained to however have no option then to resume her insulin - Glucose (CBG) - canagliflozin (INVOKANA) 100 MG TABS tablet; Take 1 tablet (100 mg total) by mouth daily before breakfast.  Dispense: 30 tablet; Refill: 3  2. Knee pain, acute, left Advised to lose weight, use knee brace - ibuprofen (ADVIL,MOTRIN) 200 MG tablet; Take 200 mg by mouth every 6 (six) hours as needed.  3. Pelvic pain in female Previously treated for PID  4. Vaginal candida We'll give prolonged treatment as she failed 1 day treatment. - fluconazole (DIFLUCAN) 150 MG tablet; Take 1 tablet (150 mg total) by mouth daily.  Dispense: 7 tablet; Refill: 0   Meds ordered this encounter  Medications  . canagliflozin (INVOKANA) 100 MG TABS tablet    Sig: Take 1 tablet (100 mg total) by mouth daily before breakfast.    Dispense:  30 tablet    Refill:  3  . fluconazole (DIFLUCAN) 150 MG tablet    Sig: Take 1 tablet (150 mg total) by mouth daily.    Dispense:  7 tablet    Refill:  0    Follow-up: Return in about 3 weeks (around 03/12/2016) for follow up of Diabetets mellitus.   Arnoldo Morale MD

## 2016-02-20 NOTE — Progress Notes (Signed)
"  terrible yeast infection"- area raw Left knee pain Stopped taking novolog Pelvic pain

## 2016-02-21 ENCOUNTER — Encounter: Payer: Self-pay | Admitting: Family Medicine

## 2016-05-13 ENCOUNTER — Other Ambulatory Visit: Payer: Self-pay | Admitting: Licensed Clinical Social Worker

## 2016-05-27 ENCOUNTER — Other Ambulatory Visit: Payer: Self-pay | Admitting: Licensed Clinical Social Worker

## 2016-06-08 ENCOUNTER — Encounter (HOSPITAL_COMMUNITY): Payer: Self-pay | Admitting: *Deleted

## 2016-06-08 ENCOUNTER — Emergency Department (HOSPITAL_COMMUNITY)
Admission: EM | Admit: 2016-06-08 | Discharge: 2016-06-08 | Disposition: A | Discharge status: Home / Self Care | Payer: Self-pay | Attending: Emergency Medicine | Admitting: Emergency Medicine

## 2016-06-08 DIAGNOSIS — M545 Low back pain, unspecified: Secondary | ICD-10-CM

## 2016-06-08 DIAGNOSIS — F17210 Nicotine dependence, cigarettes, uncomplicated: Secondary | ICD-10-CM | POA: Insufficient documentation

## 2016-06-08 DIAGNOSIS — Z794 Long term (current) use of insulin: Secondary | ICD-10-CM | POA: Insufficient documentation

## 2016-06-08 DIAGNOSIS — Z79899 Other long term (current) drug therapy: Secondary | ICD-10-CM | POA: Insufficient documentation

## 2016-06-08 DIAGNOSIS — R739 Hyperglycemia, unspecified: Secondary | ICD-10-CM

## 2016-06-08 DIAGNOSIS — E1165 Type 2 diabetes mellitus with hyperglycemia: Secondary | ICD-10-CM | POA: Insufficient documentation

## 2016-06-08 LAB — CBG MONITORING, ED
GLUCOSE-CAPILLARY: 338 mg/dL — AB (ref 65–99)
Glucose-Capillary: 376 mg/dL — ABNORMAL HIGH (ref 65–99)

## 2016-06-08 LAB — URINALYSIS, ROUTINE W REFLEX MICROSCOPIC
Bilirubin Urine: NEGATIVE
Glucose, UA: >=500 mg/dL — AB
Hgb urine dipstick: NEGATIVE
Ketones, ur: NEGATIVE mg/dL
Nitrite: NEGATIVE
Protein, ur: NEGATIVE mg/dL
Specific Gravity, Urine: 1.029 (ref 1.005–1.030)
pH: 6 (ref 5.0–8.0)

## 2016-06-08 LAB — PREGNANCY, URINE: Preg Test, Ur: NEGATIVE

## 2016-06-08 MED ORDER — IBUPROFEN 600 MG PO TABS: 600.0000 mg | ORAL_TABLET | Freq: Four times a day (QID) | ORAL | 0 refills | Status: DC | PRN

## 2016-06-08 MED ORDER — KETOROLAC TROMETHAMINE 60 MG/2ML IM SOLN
60.0000 mg | Freq: Once | INTRAMUSCULAR | Status: AC
  Administered 2016-06-08: 60 mg via INTRAMUSCULAR
  Filled 2016-06-08: qty 2

## 2016-06-08 MED ORDER — METHOCARBAMOL 500 MG PO TABS
1000.0000 mg | ORAL_TABLET | Freq: Once | ORAL | Status: AC
  Administered 2016-06-08: 1000 mg via ORAL
  Filled 2016-06-08: qty 2

## 2016-06-08 MED ORDER — INSULIN ASPART 100 UNIT/ML ~~LOC~~ SOLN
10.0000 [IU] | Freq: Once | SUBCUTANEOUS | Status: AC
  Administered 2016-06-08: 10 [IU] via SUBCUTANEOUS
  Filled 2016-06-08: qty 1

## 2016-06-08 MED ORDER — METHOCARBAMOL 500 MG PO TABS: 1000.0000 mg | ORAL_TABLET | Freq: Three times a day (TID) | ORAL | 0 refills | Status: DC | PRN

## 2016-06-08 NOTE — ED Triage Notes (Signed)
Pt is here with lower, mid back pain that started yesterday and worse today.  Pt denies incontinence, numbness/tinglining, or radiation of pain.   Pt states she has not taken her blood sugar in a while and she did not take her insulin today, but she is just here for her back pain.

## 2016-06-08 NOTE — ED Provider Notes (Signed)
[facility] DEPT Provider Note   CSN: [id number] Arrival date & time: 06/08/16  1206  By signing my name below, I, [name], attest that this documentation has been prepared under the direction and in the presence of Julianne Rice, MD.  Electronically Signed: Julien Nordmann, ED Scribe. 06/08/16. 1:21 PM.    History   Chief Complaint Chief Complaint  Patient presents with  . Back Pain    The history is provided by the patient. No language interpreter was used.   HPI Comments: Bianca Yang is a 40 y.o. female who has a PMh xof DM presents to the Emergency Department complaining of moderate, gradual worsening, midline lower back pain x yesterday. She states that her pain is constant and does not radiate.  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 There is no injury or trauma noted. She has been taking ibuprofen without relief. Pt notes that her CBG has been running high after not taking her insulin. Pt states she is hydrating appropriately. She denies abdominal pain, nausea, vomiting, incontinence or urinary frequency.  Past Medical History:  Diagnosis Date  . Diabetes mellitus without complication [facility])     Patient Active Problem List   Diagnosis Date Noted  . Intractable vomiting   . PID (pelvic inflammatory disease)   . Pelvic inflammatory disease 01/14/2016  . Diabetes mellitus type 2 in obese ([place]) 01/14/2016  . Hypoxia 01/14/2016  . Sepsis ([place]) 01/14/2016  . Tubo-ovarian abscess 01/13/2016  . CONTACT DERMATITIS 12/04/2008    Past Surgical History:  Procedure Laterality Date  . TUBAL LIGATION      OB History    No data available       Home Medications    Prior to Admission medications   Medication Sig Start Date End Date Taking? Authorizing Provider  canagliflozin (INVOKANA) 100 MG TABS tablet Take 1 tablet (100 mg total) by mouth daily before breakfast. 02/20/16   Arnoldo Morale, MD  fluconazole (DIFLUCAN) 150 MG tablet Take  1 tablet (150 mg total) by mouth daily. 02/20/16   Arnoldo Morale, MD  ibuprofen (ADVIL,MOTRIN) 600 MG tablet Take 1 tablet (600 mg total) by mouth every 6 (six) hours as needed. 06/08/16   Julianne Rice, MD  insulin aspart protamine- aspart (NOVOLOG MIX 70/30) (70-30) 100 UNIT/ML injection Inject 0.18 mLs (18 Units total) into the skin 2 (two) times daily with a meal. Patient not taking: Reported on 02/20/2016 01/18/16   [name], MD  insulin starter kit- syringes MISC 1 kit by Other route once. Patient not taking: Reported on 02/20/2016 01/18/16   [name], MD  metFORMIN (GLUCOPHAGE XR) 500 MG 24 hr tablet Take 1 tablet (500 mg total) by mouth 2 (two) times daily with a meal. 02/06/16   [name], MD  methocarbamol (ROBAXIN) 500 MG tablet Take 2 tablets (1,000 mg total) by mouth every 8 (eight) hours as needed. 06/08/16   Julianne Rice, MD    Family History No family history on file.  Social History Social History  Substance Use Topics  . Smoking status: Current Every Day Smoker    Packs/day: 0.50    Types: Cigarettes  . Smokeless tobacco: Never Used  . Alcohol use Yes     Comment: socially     Allergies   Sulfa antibiotics   Review of Systems Review of Systems  Constitutional: Negative for chills, fatigue and fever.  Respiratory: Negative for cough and shortness of breath.   Cardiovascular: Negative  for chest pain, palpitations and leg swelling.  Gastrointestinal: Negative for abdominal pain, nausea and vomiting.  Genitourinary: Positive for frequency. Negative for difficulty urinating, dysuria, flank pain and hematuria.  Musculoskeletal: Positive for back pain and myalgias. Negative for arthralgias, neck pain and neck stiffness.  Skin: Negative for pallor, rash and wound.  Neurological: Negative for dizziness, weakness, light-headedness and numbness.  All other systems reviewed and are negative.    Physical Exam Updated Vital Signs BP 105/65 (BP Location:  Right Arm)   Pulse 71   Temp 98.1 F (36.7 C) (Oral)   Resp 16   LMP 05/09/2016   SpO2 96%   Physical Exam  Constitutional: She is oriented to person, place, and time. She appears well-developed and well-nourished. No distress.  HENT:  Head: Normocephalic and atraumatic.  Eyes: EOM are normal. Pupils are equal, round, and reactive to light.  Neck: Normal range of motion. Neck supple.  Cardiovascular: Normal rate and regular rhythm.   Pulmonary/Chest: Effort normal and breath sounds normal.  Abdominal: Soft. Bowel sounds are normal. There is no tenderness. There is no rebound and no guarding.  Musculoskeletal: Normal range of motion. She exhibits no edema or tenderness.  No CVA tenderness. Patient has midline and paraspinal lumbar tenderness diffusely. No lower extremity swelling, asymmetry or tenderness. Distal pulses intact.  Neurological: She is alert and oriented to person, place, and time.  Patient is alert and oriented x3 with clear, goal oriented speech. Patient has 5/5 motor in all extremities. Sensation is intact to light touch. Patient has a normal gait and walks without assistance.  Skin: Skin is warm and dry. Capillary refill takes less than 2 seconds. No rash noted. No erythema.  Psychiatric: She has a normal mood and affect. Her behavior is normal.  Nursing note and vitals reviewed.    ED Treatments / Results  DIAGNOSTIC STUDIES: Oxygen Saturation is 97% on RA, normal by my interpretation.  COORDINATION OF CARE:  1:19 PM Discussed treatment plan which includes shot of toradol and muscle relaxants with pt at bedside and pt agreed to plan.  Labs (all labs ordered are listed, but only abnormal results are displayed) Labs Reviewed  URINALYSIS, ROUTINE W REFLEX MICROSCOPIC - Abnormal; Notable for the following:       Result Value   APPearance HAZY (*)    Glucose, UA >=500 (*)    Leukocytes, UA MODERATE (*)    Bacteria, UA RARE (*)    Squamous Epithelial / LPF 6-30  (*)    All other components within normal limits  CBG MONITORING, ED - Abnormal; Notable for the following:    Glucose-Capillary 376 (*)    All other components within normal limits  CBG MONITORING, ED - Abnormal; Notable for the following:    Glucose-Capillary 338 (*)    All other components within normal limits  PREGNANCY, URINE    EKG  EKG Interpretation None       Radiology No results found.  Procedures Procedures (including critical care time)  Medications Ordered in ED Medications  ketorolac (TORADOL) injection 60 mg (60 mg Intramuscular Given 06/08/16 1335)  methocarbamol (ROBAXIN) tablet 1,000 mg (1,000 mg Oral Given 06/08/16 1337)  insulin aspart (novoLOG) injection 10 Units (10 Units Subcutaneous Given 06/08/16 1348)     Initial Impression / Assessment and Plan / ED Course  I have reviewed the triage vital signs and the nursing notes.  Pertinent labs & imaging results that were available during my care of the patient were  reviewed by me and considered in my medical decision making (see chart for details).  Clinical Course     I personally performed the services described in this documentation, which was scribed in my presence. The recorded information has been reviewed and is accurate.   No red flag signs or symptoms. Normal neurologic exam. Likely muscular skeletal. Patient was hyperglycemic but has not taken her medication today. She is eating chips in the room. Mild improvement of her blood sugar with insulin. She is advised to take her medications as prescribed and follow-up with her primary physician. Return precautions given. Final Clinical Impressions(s) / ED Diagnoses   Final diagnoses:  Acute midline low back pain without sciatica  Hyperglycemia    New Prescriptions New Prescriptions   IBUPROFEN (ADVIL,MOTRIN) 600 MG TABLET    Take 1 tablet (600 mg total) by mouth every 6 (six) hours as needed.   METHOCARBAMOL (ROBAXIN) 500 MG TABLET    Take 2  tablets (1,000 mg total) by mouth every 8 (eight) hours as needed.     Julianne Rice, MD 06/08/16 1500

## 2016-06-08 NOTE — ED Notes (Signed)
No answer

## 2016-07-23 ENCOUNTER — Emergency Department (HOSPITAL_COMMUNITY)
Admission: EM | Admit: 2016-07-23 | Discharge: 2016-07-23 | Disposition: A | Discharge status: Home / Self Care | Payer: Self-pay | Attending: Emergency Medicine | Admitting: Emergency Medicine

## 2016-07-23 ENCOUNTER — Emergency Department (HOSPITAL_COMMUNITY): Payer: Self-pay

## 2016-07-23 ENCOUNTER — Encounter (HOSPITAL_COMMUNITY): Payer: Self-pay

## 2016-07-23 DIAGNOSIS — Z794 Long term (current) use of insulin: Secondary | ICD-10-CM | POA: Insufficient documentation

## 2016-07-23 DIAGNOSIS — Y9372 Activity, wrestling: Secondary | ICD-10-CM | POA: Insufficient documentation

## 2016-07-23 DIAGNOSIS — W1839XA Other fall on same level, initial encounter: Secondary | ICD-10-CM | POA: Insufficient documentation

## 2016-07-23 DIAGNOSIS — R0789 Other chest pain: Secondary | ICD-10-CM | POA: Insufficient documentation

## 2016-07-23 DIAGNOSIS — Y999 Unspecified external cause status: Secondary | ICD-10-CM | POA: Insufficient documentation

## 2016-07-23 DIAGNOSIS — E119 Type 2 diabetes mellitus without complications: Secondary | ICD-10-CM | POA: Insufficient documentation

## 2016-07-23 DIAGNOSIS — Y929 Unspecified place or not applicable: Secondary | ICD-10-CM | POA: Insufficient documentation

## 2016-07-23 DIAGNOSIS — F17210 Nicotine dependence, cigarettes, uncomplicated: Secondary | ICD-10-CM | POA: Insufficient documentation

## 2016-07-23 LAB — BASIC METABOLIC PANEL
Anion gap: 13 (ref 5–15)
BUN: 5 mg/dL — AB (ref 6–20)
CO2: 30 mmol/L (ref 22–32)
CREATININE: 0.68 mg/dL (ref 0.44–1.00)
Calcium: 8.9 mg/dL (ref 8.9–10.3)
Chloride: 93 mmol/L — ABNORMAL LOW (ref 101–111)
GFR calc Af Amer: >60 mL/min (ref >60)
Glucose, Bld: 452 mg/dL — ABNORMAL HIGH (ref 65–99)
POTASSIUM: 3.3 mmol/L — AB (ref 3.5–5.1)
Sodium: 136 mmol/L (ref 135–145)

## 2016-07-23 LAB — CBC WITH DIFFERENTIAL/PLATELET
BASOS ABS: 0 10*3/uL (ref 0.0–0.1)
Basophils Relative: 0 %
EOS PCT: 2 %
Eosinophils Absolute: 0.2 10*3/uL (ref 0.0–0.7)
HCT: 42.4 % (ref 36.0–46.0)
Hemoglobin: 13.5 g/dL (ref 12.0–15.0)
LYMPHS PCT: 17 %
Lymphs Abs: 1.8 10*3/uL (ref 0.7–4.0)
MCH: 28.7 pg (ref 26.0–34.0)
MCHC: 31.8 g/dL (ref 30.0–36.0)
MCV: 90.2 fL (ref 78.0–100.0)
MONO ABS: 0.4 10*3/uL (ref 0.1–1.0)
Monocytes Relative: 4 %
Neutro Abs: 7.9 10*3/uL — ABNORMAL HIGH (ref 1.7–7.7)
Neutrophils Relative %: 77 %
PLATELETS: 198 10*3/uL (ref 150–400)
RBC: 4.7 MIL/uL (ref 3.87–5.11)
RDW: 13 % (ref 11.5–15.5)
WBC: 10.3 10*3/uL (ref 4.0–10.5)

## 2016-07-23 LAB — I-STAT TROPONIN, ED: Troponin i, poc: 0 ng/mL (ref 0.00–0.08)

## 2016-07-23 LAB — CBG MONITORING, ED: GLUCOSE-CAPILLARY: 491 mg/dL — AB (ref 65–99)

## 2016-07-23 MED ORDER — HYDROCODONE-ACETAMINOPHEN 5-325 MG PO TABS
2.0000 | ORAL_TABLET | Freq: Once | ORAL | Status: AC
  Administered 2016-07-23: 2 via ORAL
  Filled 2016-07-23: qty 2

## 2016-07-23 MED ORDER — KETOROLAC TROMETHAMINE 30 MG/ML IJ SOLN
30.0000 mg | Freq: Once | INTRAMUSCULAR | Status: DC
  Filled 2016-07-23: qty 1

## 2016-07-23 MED ORDER — NAPROXEN 500 MG PO TABS: 500.0000 mg | ORAL_TABLET | Freq: Two times a day (BID) | ORAL | 0 refills | Status: DC

## 2016-07-23 MED ORDER — INSULIN ASPART 100 UNIT/ML ~~LOC~~ SOLN
10.0000 [IU] | Freq: Once | SUBCUTANEOUS | Status: AC
  Administered 2016-07-23: 10 [IU] via SUBCUTANEOUS

## 2016-07-23 MED ORDER — HYDROCODONE-ACETAMINOPHEN 5-325 MG PO TABS: 1.0000 | ORAL_TABLET | Freq: Four times a day (QID) | ORAL | 0 refills | Status: DC | PRN

## 2016-07-23 MED ORDER — MORPHINE SULFATE (PF) 4 MG/ML IV SOLN
4.0000 mg | Freq: Once | INTRAVENOUS | Status: DC
  Filled 2016-07-23: qty 1

## 2016-07-23 MED ORDER — INSULIN ASPART 100 UNIT/ML ~~LOC~~ SOLN
10.0000 [IU] | Freq: Once | SUBCUTANEOUS | Status: DC
  Filled 2016-07-23: qty 1

## 2016-07-23 NOTE — ED Notes (Signed)
cbg was 491 

## 2016-07-23 NOTE — ED Provider Notes (Signed)
[facility] DEPT Provider Note   CSN: [id number] Arrival date & time: 07/23/16  0736     History   Chief Complaint Chief Complaint  Patient presents with  . Chest Pain    HPI Bianca Yang is a 41 y.o. female.  Patient is a 41 year old female with history of diabetes. She presents for evaluation of chest pain that started last week. She reports that she was "play wrestling" with a friend when she fell and landed on the sofa injuring her chest wall. She reports pain in the left anterior ribs that is worse with palpation and breathing. She denies shortness of breath but her pain is worse with deep inspiration.   The history is provided by the patient.  Chest Pain   This is a new problem. Episode onset: One week ago. The problem occurs constantly. The problem has been rapidly worsening. The pain is associated with movement, coughing and breathing (Palpation). The pain is present in the lateral region. The pain is severe. The quality of the pain is described as sharp and stabbing. The pain does not radiate. She has tried nothing for the symptoms. The treatment provided no relief.    Past Medical History:  Diagnosis Date  . Diabetes mellitus without complication [facility])     Patient Active Problem List   Diagnosis Date Noted  . Intractable vomiting   . PID (pelvic inflammatory disease)   . Pelvic inflammatory disease 01/14/2016  . Diabetes mellitus type 2 in obese ([place]) 01/14/2016  . Hypoxia 01/14/2016  . Sepsis ([place]) 01/14/2016  . Tubo-ovarian abscess 01/13/2016  . CONTACT DERMATITIS 12/04/2008    Past Surgical History:  Procedure Laterality Date  . TUBAL LIGATION      OB History    No data available       Home Medications    Prior to Admission medications   Medication Sig Start Date End Date Taking? Authorizing Provider  canagliflozin (INVOKANA) 100 MG TABS tablet Take 1 tablet (100 mg total) by mouth daily before breakfast. 02/20/16   Arnoldo Morale, MD    fluconazole (DIFLUCAN) 150 MG tablet Take 1 tablet (150 mg total) by mouth daily. 02/20/16   Arnoldo Morale, MD  ibuprofen (ADVIL,MOTRIN) 600 MG tablet Take 1 tablet (600 mg total) by mouth every 6 (six) hours as needed. 06/08/16   Julianne Rice, MD  insulin aspart protamine- aspart (NOVOLOG MIX 70/30) (70-30) 100 UNIT/ML injection Inject 0.18 mLs (18 Units total) into the skin 2 (two) times daily with a meal. Patient not taking: Reported on 02/20/2016 01/18/16   [name], MD  insulin starter kit- syringes MISC 1 kit by Other route once. Patient not taking: Reported on 02/20/2016 01/18/16   [name], MD  metFORMIN (GLUCOPHAGE XR) 500 MG 24 hr tablet Take 1 tablet (500 mg total) by mouth 2 (two) times daily with a meal. 02/06/16   [name], MD  methocarbamol (ROBAXIN) 500 MG tablet Take 2 tablets (1,000 mg total) by mouth every 8 (eight) hours as needed. 06/08/16   Julianne Rice, MD    Family History No family history on file.  Social History Social History  Substance Use Topics  . Smoking status: Current Every Day Smoker    Packs/day: 0.50    Types: Cigarettes  . Smokeless tobacco: Never Used  . Alcohol use Yes     Comment: socially     Allergies   Sulfa antibiotics   Review of Systems Review of Systems  Cardiovascular: Positive for chest  pain.  All other systems reviewed and are negative.    Physical Exam Updated Vital Signs BP 135/74 (BP Location: Right Arm)   Pulse 69   Temp 98.9 F (37.2 C) (Oral)   Resp 17   Ht 5' 7" (1.702 m)   Wt 213 lb (96.6 kg)   LMP 07/15/2016   SpO2 96%   BMI 33.36 kg/m   Physical Exam  Constitutional: She is oriented to person, place, and time. She appears well-developed and well-nourished. No distress.  HENT:  Head: Normocephalic and atraumatic.  Neck: Normal range of motion. Neck supple.  Cardiovascular: Normal rate and regular rhythm.  Exam reveals no gallop and no friction rub.   No murmur heard. Pulmonary/Chest:  Effort normal and breath sounds normal. No respiratory distress. She has no wheezes. She exhibits tenderness.  There is exquisite tenderness to palpation in the left anterior chest wall. There is no crepitus. Breath sounds are clear and equal.  Abdominal: Soft. Bowel sounds are normal. She exhibits no distension. There is no tenderness.  Musculoskeletal: Normal range of motion.  Neurological: She is alert and oriented to person, place, and time.  Skin: Skin is warm and dry. She is not diaphoretic.  Nursing note and vitals reviewed.    ED Treatments / Results  Labs (all labs ordered are listed, but only abnormal results are displayed) Labs Reviewed  CBG MONITORING, ED - Abnormal; Notable for the following:       Result Value   Glucose-Capillary 491 (*)    All other components within normal limits  BASIC METABOLIC PANEL  CBC WITH DIFFERENTIAL/PLATELET  I-STAT TROPOININ, ED    EKG  EKG Interpretation  Date/Time:  Thursday July 23 2016 07:43:26 EST Ventricular Rate:  70 PR Interval:    QRS Duration: 111 QT Interval:  394 QTC Calculation: 426 R Axis:   17 Text Interpretation:  Sinus rhythm Anteroseptal infarct, age indeterminate Baseline wander in lead(s) V6 Confirmed by [place] Hank  MD, Leonia Heatherly ([id number]) on 07/23/2016 8:08:15 AM       Radiology No results found.  Procedures Procedures (including critical care time)  Medications Ordered in ED Medications  ketorolac (TORADOL) 30 MG/ML injection 30 mg (not administered)  morphine 4 MG/ML injection 4 mg (not administered)     Initial Impression / Assessment and Plan / ED Course  I have reviewed the triage vital signs and the nursing notes.  Pertinent labs & imaging results that were available during my care of the patient were reviewed by me and considered in my medical decision making (see chart for details).     Patient presents here with complaints of pain in the anterior chest wall. This began 1 week ago after falling  while wrestling. Her x-rays today are negative for fracture or pneumothorax and cardiac workup is unremarkable. I highly doubt a cardiac etiology and believe her symptoms to be musculoskeletal. She will be treated with anti-inflammatories, pain medication, and when necessary follow-up. She will be given a work excuse for 2 days.  Final Clinical Impressions(s) / ED Diagnoses   Final diagnoses:  None    New Prescriptions New Prescriptions   No medications on file     Veryl Speak, MD 07/23/16 1014

## 2016-07-23 NOTE — Discharge Instructions (Signed)
Naproxen as prescribed. Hydrocodone as prescribed as needed for pain not relieved with naproxen.  Follow-up with your primary Dr. if not improving in the next week, and return to the ER if you develop worsening pain, shortness of breath, or other new and concerning symptoms.

## 2016-07-23 NOTE — ED Triage Notes (Signed)
Pt brought in by EMS due to having chest pain x2 days, has gotten worse this am. Pt denies n/v. Pt states pain is worse on movement. Pt has hx of DM and CBG was 518 per EMS. Pt received 324mg  of aspirin enroute. Pt a&ox4.

## 2016-11-04 ENCOUNTER — Encounter (HOSPITAL_COMMUNITY): Payer: Self-pay | Admitting: Emergency Medicine

## 2016-11-04 ENCOUNTER — Observation Stay (HOSPITAL_COMMUNITY)
Admission: EM | Admit: 2016-11-04 | Discharge: 2016-11-06 | Disposition: A | Discharge status: Home / Self Care | Payer: Self-pay | Attending: Student in an Organized Health Care Education/Training Program | Admitting: Student in an Organized Health Care Education/Training Program

## 2016-11-04 ENCOUNTER — Emergency Department (HOSPITAL_COMMUNITY): Payer: Self-pay

## 2016-11-04 DIAGNOSIS — F17210 Nicotine dependence, cigarettes, uncomplicated: Secondary | ICD-10-CM | POA: Insufficient documentation

## 2016-11-04 DIAGNOSIS — M609 Myositis, unspecified: Secondary | ICD-10-CM | POA: Insufficient documentation

## 2016-11-04 DIAGNOSIS — N739 Female pelvic inflammatory disease, unspecified: Secondary | ICD-10-CM

## 2016-11-04 DIAGNOSIS — B351 Tinea unguium: Secondary | ICD-10-CM | POA: Insufficient documentation

## 2016-11-04 DIAGNOSIS — L0390 Cellulitis, unspecified: Secondary | ICD-10-CM | POA: Diagnosis present

## 2016-11-04 DIAGNOSIS — L03119 Cellulitis of unspecified part of limb: Secondary | ICD-10-CM

## 2016-11-04 DIAGNOSIS — E1169 Type 2 diabetes mellitus with other specified complication: Secondary | ICD-10-CM | POA: Diagnosis present

## 2016-11-04 DIAGNOSIS — E669 Obesity, unspecified: Secondary | ICD-10-CM | POA: Insufficient documentation

## 2016-11-04 DIAGNOSIS — L02619 Cutaneous abscess of unspecified foot: Secondary | ICD-10-CM

## 2016-11-04 DIAGNOSIS — Z6827 Body mass index (BMI) 27.0-27.9, adult: Secondary | ICD-10-CM | POA: Insufficient documentation

## 2016-11-04 DIAGNOSIS — E871 Hypo-osmolality and hyponatremia: Secondary | ICD-10-CM | POA: Insufficient documentation

## 2016-11-04 DIAGNOSIS — Z91120 Patient's intentional underdosing of medication regimen due to financial hardship: Secondary | ICD-10-CM

## 2016-11-04 DIAGNOSIS — L02612 Cutaneous abscess of left foot: Secondary | ICD-10-CM | POA: Insufficient documentation

## 2016-11-04 DIAGNOSIS — B373 Candidiasis of vulva and vagina: Secondary | ICD-10-CM | POA: Insufficient documentation

## 2016-11-04 DIAGNOSIS — R739 Hyperglycemia, unspecified: Secondary | ICD-10-CM

## 2016-11-04 DIAGNOSIS — Z9114 Patient's other noncompliance with medication regimen: Secondary | ICD-10-CM | POA: Insufficient documentation

## 2016-11-04 DIAGNOSIS — Z833 Family history of diabetes mellitus: Secondary | ICD-10-CM

## 2016-11-04 DIAGNOSIS — IMO0002 Reserved for concepts with insufficient information to code with codable children: Secondary | ICD-10-CM

## 2016-11-04 DIAGNOSIS — F1910 Other psychoactive substance abuse, uncomplicated: Secondary | ICD-10-CM | POA: Insufficient documentation

## 2016-11-04 DIAGNOSIS — Z590 Homelessness: Secondary | ICD-10-CM | POA: Insufficient documentation

## 2016-11-04 DIAGNOSIS — E1165 Type 2 diabetes mellitus with hyperglycemia: Secondary | ICD-10-CM | POA: Insufficient documentation

## 2016-11-04 DIAGNOSIS — L03116 Cellulitis of left lower limb: Principal | ICD-10-CM | POA: Insufficient documentation

## 2016-11-04 DIAGNOSIS — Z882 Allergy status to sulfonamides status: Secondary | ICD-10-CM

## 2016-11-04 DIAGNOSIS — E118 Type 2 diabetes mellitus with unspecified complications: Secondary | ICD-10-CM | POA: Diagnosis present

## 2016-11-04 DIAGNOSIS — N898 Other specified noninflammatory disorders of vagina: Secondary | ICD-10-CM

## 2016-11-04 DIAGNOSIS — E11628 Type 2 diabetes mellitus with other skin complications: Secondary | ICD-10-CM | POA: Diagnosis present

## 2016-11-04 LAB — CBC WITH DIFFERENTIAL/PLATELET
BASOS ABS: 0.1 10*3/uL (ref 0.0–0.1)
Basophils Relative: 0 %
EOS PCT: 2 %
Eosinophils Absolute: 0.4 10*3/uL (ref 0.0–0.7)
HCT: 45 % (ref 36.0–46.0)
HEMOGLOBIN: 14.6 g/dL (ref 12.0–15.0)
LYMPHS ABS: 2.6 10*3/uL (ref 0.7–4.0)
LYMPHS PCT: 14 %
MCH: 28.8 pg (ref 26.0–34.0)
MCHC: 32.4 g/dL (ref 30.0–36.0)
MCV: 88.8 fL (ref 78.0–100.0)
MONO ABS: 1 10*3/uL (ref 0.1–1.0)
Monocytes Relative: 5 %
NEUTROS ABS: 14.5 10*3/uL — AB (ref 1.7–7.7)
Neutrophils Relative %: 79 %
PLATELETS: 268 10*3/uL (ref 150–400)
RBC: 5.07 MIL/uL (ref 3.87–5.11)
RDW: 13.2 % (ref 11.5–15.5)
WBC: 18.6 10*3/uL — ABNORMAL HIGH (ref 4.0–10.5)

## 2016-11-04 LAB — I-STAT CHEM 8, ED
BUN: 18 mg/dL (ref 6–20)
CALCIUM ION: 1.05 mmol/L — AB (ref 1.15–1.40)
Chloride: 93 mmol/L — ABNORMAL LOW (ref 101–111)
Creatinine, Ser: 0.6 mg/dL (ref 0.44–1.00)
Glucose, Bld: 538 mg/dL (ref 65–99)
HEMATOCRIT: 48 % — AB (ref 36.0–46.0)
Hemoglobin: 16.3 g/dL — ABNORMAL HIGH (ref 12.0–15.0)
Potassium: 5.6 mmol/L — ABNORMAL HIGH (ref 3.5–5.1)
SODIUM: 131 mmol/L — AB (ref 135–145)
TCO2: 32 mmol/L (ref 0–100)

## 2016-11-04 LAB — COMPREHENSIVE METABOLIC PANEL
ALK PHOS: 132 U/L — AB (ref 38–126)
ALT: 12 U/L — AB (ref 14–54)
AST: 19 U/L (ref 15–41)
Albumin: 3.9 g/dL (ref 3.5–5.0)
Anion gap: 13 (ref 5–15)
BILIRUBIN TOTAL: 0.6 mg/dL (ref 0.3–1.2)
BUN: 10 mg/dL (ref 6–20)
CALCIUM: 9.8 mg/dL (ref 8.9–10.3)
CHLORIDE: 92 mmol/L — AB (ref 101–111)
CO2: 26 mmol/L (ref 22–32)
CREATININE: 0.7 mg/dL (ref 0.44–1.00)
GFR calc non Af Amer: >60 mL/min (ref >60)
Glucose, Bld: 497 mg/dL — ABNORMAL HIGH (ref 65–99)
Potassium: 4.6 mmol/L (ref 3.5–5.1)
Sodium: 131 mmol/L — ABNORMAL LOW (ref 135–145)
Total Protein: 7.5 g/dL (ref 6.5–8.1)

## 2016-11-04 LAB — C-REACTIVE PROTEIN: CRP: <0.8 mg/dL (ref <1.0)

## 2016-11-04 LAB — I-STAT VENOUS BLOOD GAS, ED
ACID-BASE EXCESS: 13 mmol/L — AB (ref 0.0–2.0)
BICARBONATE: 35.3 mmol/L — AB (ref 20.0–28.0)
O2 Saturation: 100 %
PCO2 VEN: 36.9 mmHg — AB (ref 44.0–60.0)
PH VEN: 7.588 — AB (ref 7.250–7.430)
TCO2: 36 mmol/L (ref 0–100)
pO2, Ven: 175 mmHg — ABNORMAL HIGH (ref 32.0–45.0)

## 2016-11-04 LAB — URINALYSIS, ROUTINE W REFLEX MICROSCOPIC
Bacteria, UA: NONE SEEN
Bilirubin Urine: NEGATIVE
HGB URINE DIPSTICK: NEGATIVE
Ketones, ur: NEGATIVE mg/dL
Leukocytes, UA: NEGATIVE
Nitrite: NEGATIVE
Protein, ur: NEGATIVE mg/dL
RBC / HPF: NONE SEEN RBC/hpf (ref 0–5)
SPECIFIC GRAVITY, URINE: 1.03 (ref 1.005–1.030)
pH: 7 (ref 5.0–8.0)

## 2016-11-04 LAB — GLUCOSE, CAPILLARY
GLUCOSE-CAPILLARY: 200 mg/dL — AB (ref 65–99)
Glucose-Capillary: 309 mg/dL — ABNORMAL HIGH (ref 65–99)

## 2016-11-04 LAB — PREGNANCY, URINE: PREG TEST UR: NEGATIVE

## 2016-11-04 LAB — SEDIMENTATION RATE: SED RATE: 10 mm/h (ref 0–22)

## 2016-11-04 LAB — CBG MONITORING, ED: GLUCOSE-CAPILLARY: 491 mg/dL — AB (ref 65–99)

## 2016-11-04 MED ORDER — DEXTROSE 5 % IV SOLN
2.0000 g | INTRAVENOUS | Status: DC
  Administered 2016-11-04: 2 g via INTRAVENOUS
  Filled 2016-11-04: qty 2

## 2016-11-04 MED ORDER — INSULIN ASPART 100 UNIT/ML ~~LOC~~ SOLN
0.0000 [IU] | Freq: Every day | SUBCUTANEOUS | Status: DC
  Administered 2016-11-05: 4 [IU] via SUBCUTANEOUS

## 2016-11-04 MED ORDER — MORPHINE SULFATE (PF) 4 MG/ML IV SOLN
4.0000 mg | Freq: Once | INTRAVENOUS | Status: AC
  Administered 2016-11-04: 4 mg via INTRAVENOUS
  Filled 2016-11-04: qty 1

## 2016-11-04 MED ORDER — ENSURE ENLIVE PO LIQD: 237.0000 mL | Freq: Two times a day (BID) | ORAL | Status: DC

## 2016-11-04 MED ORDER — INSULIN ASPART 100 UNIT/ML ~~LOC~~ SOLN
3.0000 [IU] | Freq: Three times a day (TID) | SUBCUTANEOUS | Status: DC
  Administered 2016-11-04 – 2016-11-06 (×6): 3 [IU] via SUBCUTANEOUS

## 2016-11-04 MED ORDER — INSULIN ASPART 100 UNIT/ML ~~LOC~~ SOLN
0.0000 [IU] | Freq: Three times a day (TID) | SUBCUTANEOUS | Status: DC
  Administered 2016-11-04: 11 [IU] via SUBCUTANEOUS
  Administered 2016-11-05: 3 [IU] via SUBCUTANEOUS
  Administered 2016-11-05 (×2): 5 [IU] via SUBCUTANEOUS
  Administered 2016-11-06: 2 [IU] via SUBCUTANEOUS
  Administered 2016-11-06: 3 [IU] via SUBCUTANEOUS

## 2016-11-04 MED ORDER — SENNA 8.6 MG PO TABS
1.0000 | ORAL_TABLET | Freq: Two times a day (BID) | ORAL | Status: DC
  Filled 2016-11-04: qty 1

## 2016-11-04 MED ORDER — FENTANYL CITRATE (PF) 100 MCG/2ML IJ SOLN
50.0000 ug | Freq: Once | INTRAMUSCULAR | Status: AC
  Administered 2016-11-04: 50 ug via INTRAVENOUS
  Filled 2016-11-04: qty 2

## 2016-11-04 MED ORDER — ENOXAPARIN SODIUM 40 MG/0.4ML ~~LOC~~ SOLN
40.0000 mg | SUBCUTANEOUS | Status: DC
  Administered 2016-11-04 – 2016-11-05 (×2): 40 mg via SUBCUTANEOUS
  Filled 2016-11-04 (×2): qty 0.4

## 2016-11-04 MED ORDER — HYDROCODONE-ACETAMINOPHEN 5-325 MG PO TABS
1.0000 | ORAL_TABLET | ORAL | Status: DC | PRN
  Administered 2016-11-04 – 2016-11-06 (×10): 2 via ORAL
  Filled 2016-11-04 (×10): qty 2

## 2016-11-04 MED ORDER — METRONIDAZOLE IN NACL 5-0.79 MG/ML-% IV SOLN
500.0000 mg | Freq: Three times a day (TID) | INTRAVENOUS | Status: DC
  Administered 2016-11-04 – 2016-11-05 (×2): 500 mg via INTRAVENOUS
  Filled 2016-11-04 (×2): qty 100

## 2016-11-04 MED ORDER — VANCOMYCIN HCL IN DEXTROSE 1-5 GM/200ML-% IV SOLN
1000.0000 mg | Freq: Two times a day (BID) | INTRAVENOUS | Status: DC
  Administered 2016-11-04: 1000 mg via INTRAVENOUS
  Filled 2016-11-04: qty 200

## 2016-11-04 MED ORDER — SODIUM CHLORIDE 0.9 % IV SOLN
INTRAVENOUS | Status: AC
  Administered 2016-11-04 (×2): via INTRAVENOUS

## 2016-11-04 NOTE — ED Provider Notes (Signed)
[facility] DEPT Provider Note   CSN: [id number] Arrival date & time: 11/04/16  1033     History   Chief Complaint Chief Complaint  Patient presents with  . Foot Pain    HPI Bianca Yang is a 41 y.o. female.  HPI Patient presents with left foot pain for the last 5 days. Worse with walking. Has swelling. She has put antifungal cream on without relief. She is a diabetic but has been off her medications for around 9 months now. States was due to financial issues. No trauma to the foot. No fevers. States she does not know when her sugar goes high and she cannot tell. States she can tell when it goes low.   Past Medical History:  Diagnosis Date  . Diabetes mellitus without complication [name])     Patient Active Problem List   Diagnosis Date Noted  . Intractable vomiting   . PID (pelvic inflammatory disease)   . Pelvic inflammatory disease 01/14/2016  . Diabetes mellitus type 2 in obese ([name]) 01/14/2016  . Hypoxia 01/14/2016  . Sepsis ([name]) 01/14/2016  . Tubo-ovarian abscess 01/13/2016  . CONTACT DERMATITIS 12/04/2008    Past Surgical History:  Procedure Laterality Date  . TUBAL LIGATION      OB History    No data available       Home Medications    Prior to Admission medications   Medication Sig Start Date End Date Taking? Authorizing Provider  canagliflozin (INVOKANA) 100 MG TABS tablet Take 1 tablet (100 mg total) by mouth daily before breakfast. Patient not taking: Reported on 07/23/2016 02/20/16   Arnoldo Morale, MD  fluconazole (DIFLUCAN) 150 MG tablet Take 1 tablet (150 mg total) by mouth daily. Patient not taking: Reported on 07/23/2016 02/20/16   Arnoldo Morale, MD  HYDROcodone-acetaminophen (NORCO/VICODIN) 5-325 MG tablet Take 1-2 tablets by mouth every 6 (six) hours as needed. 07/23/16   Veryl Speak, MD  ibuprofen (ADVIL,MOTRIN) 600 MG tablet Take 1 tablet (600 mg total) by mouth every 6 (six) hours as needed. 06/08/16   Julianne Rice, MD  insulin  aspart protamine- aspart (NOVOLOG MIX 70/30) (70-30) 100 UNIT/ML injection Inject 0.18 mLs (18 Units total) into the skin 2 (two) times daily with a meal. Patient not taking: Reported on 02/20/2016 01/18/16   [name], MD  insulin starter kit- syringes MISC 1 kit by Other route once. Patient not taking: Reported on 02/20/2016 01/18/16   [name], MD  metFORMIN (GLUCOPHAGE XR) 500 MG 24 hr tablet Take 1 tablet (500 mg total) by mouth 2 (two) times daily with a meal. Patient not taking: Reported on 07/23/2016 02/06/16   [name], MD  methocarbamol (ROBAXIN) 500 MG tablet Take 2 tablets (1,000 mg total) by mouth every 8 (eight) hours as needed. Patient not taking: Reported on 07/23/2016 06/08/16   Julianne Rice, MD  naproxen (NAPROSYN) 500 MG tablet Take 1 tablet (500 mg total) by mouth 2 (two) times daily. 07/23/16   Veryl Speak, MD    Family History History reviewed. No pertinent family history.  Social History Social History  Substance Use Topics  . Smoking status: Current Every Day Smoker    Packs/day: 0.50    Types: Cigarettes  . Smokeless tobacco: Never Used  . Alcohol use Yes     Comment: socially     Allergies   Sulfa antibiotics   Review of Systems Review of Systems  Constitutional: Negative for appetite change and fever.  HENT: Negative for congestion.  Cardiovascular: Negative for chest pain.  Gastrointestinal: Negative for abdominal pain.  Endocrine: Positive for polyuria.  Genitourinary: Positive for frequency.  Musculoskeletal:       Left foot pain  Skin: Positive for wound.  Neurological: Negative for weakness.  Hematological: Negative for adenopathy.  Psychiatric/Behavioral: Negative for behavioral problems.     Physical Exam Updated Vital Signs BP 134/83 (BP Location: Right Arm)   Pulse 77   Temp 98.1 F (36.7 C) (Oral)   Resp 16   Ht 5' 8"  (1.727 m)   Wt 182 lb 11.2 oz (82.9 kg)   LMP 10/13/2016 (Approximate)   SpO2 95%   BMI  27.78 kg/m   Physical Exam  Constitutional: She appears well-developed.  Patient is crying.  HENT:  Head: Normocephalic.  Neck: Neck supple.  Cardiovascular: Normal rate.   Pulmonary/Chest: Effort normal.  Abdominal: Soft. There is no tenderness.  Musculoskeletal:  Some erythema and induration between the second and third toe on the left foot. It progresses around 1/2 cm proximally also. Pain with movement of the digits.. Some swelling of the second and third toes. Her cells pedis pulse intact.  Neurological: She is alert.  Skin: Skin is warm. Capillary refill takes less than 2 seconds.     ED Treatments / Results  Labs (all labs ordered are listed, but only abnormal results are displayed) Labs Reviewed  COMPREHENSIVE METABOLIC PANEL - Abnormal; Notable for the following:       Result Value   Sodium 131 (*)    Chloride 92 (*)    Glucose, Bld 497 (*)    ALT 12 (*)    Alkaline Phosphatase 132 (*)    All other components within normal limits  URINALYSIS, ROUTINE W REFLEX MICROSCOPIC - Abnormal; Notable for the following:    Color, Urine STRAW (*)    Glucose, UA >=500 (*)    Squamous Epithelial / LPF 0-5 (*)    All other components within normal limits  CBC WITH DIFFERENTIAL/PLATELET - Abnormal; Notable for the following:    WBC 18.6 (*)    Neutro Abs 14.5 (*)    All other components within normal limits  I-STAT CHEM 8, ED - Abnormal; Notable for the following:    Sodium 131 (*)    Potassium 5.6 (*)    Chloride 93 (*)    Glucose, Bld 538 (*)    Calcium, Ion 1.05 (*)    Hemoglobin 16.3 (*)    HCT 48.0 (*)    All other components within normal limits  CBG MONITORING, ED - Abnormal; Notable for the following:    Glucose-Capillary 491 (*)    All other components within normal limits  I-STAT VENOUS BLOOD GAS, ED - Abnormal; Notable for the following:    pH, Ven 7.588 (*)    pCO2, Ven 36.9 (*)    pO2, Ven 175.0 (*)    Bicarbonate 35.3 (*)    Acid-Base Excess 13.0 (*)      All other components within normal limits  PREGNANCY, URINE  BLOOD GAS, VENOUS    EKG  EKG Interpretation None       Radiology Dg Foot Complete Left  Result Date: 11/04/2016 CLINICAL DATA:  Pain and swelling in the left foot. No reported injury. EXAM: LEFT FOOT - COMPLETE 3+ VIEW COMPARISON:  None. FINDINGS: No fracture or dislocation. No suspicious focal osseous lesion. No cortical erosions or periosteal reaction. Mild soft tissue swelling in the dorsal distal left foot. Small Achilles and tiny plantar  left calcaneal spurs. No radiopaque foreign body. IMPRESSION: Mild dorsal distal left foot soft tissue swelling. No acute osseous abnormality in the left foot. Electronically Signed   By: Ilona Sorrel M.D.   On: 11/04/2016 12:31    Procedures Procedures (including critical care time)  Medications Ordered in ED Medications  fentaNYL (SUBLIMAZE) injection 50 mcg (50 mcg Intravenous Given 11/04/16 1209)  morphine 4 MG/ML injection 4 mg (4 mg Intravenous Given 11/04/16 1240)     Initial Impression / Assessment and Plan / ED Course  I have reviewed the triage vital signs and the nursing notes.  Pertinent labs & imaging results that were available during my care of the patient were reviewed by me and considered in my medical decision making (see chart for details).     Patient with foot infection. Swelling between second and third toe. X-ray reassuring but potentially could be an abscess versus cellulitis. Sugar 500. White count of 18. With continued severe pain will admit to internal medicine.  Final Clinical Impressions(s) / ED Diagnoses   Final diagnoses:  Hyperglycemia  Noncompliance with medication regimen  Abscess or cellulitis of foot    New Prescriptions New Prescriptions   No medications on file     Davonna Belling, MD 11/04/16 1331

## 2016-11-04 NOTE — ED Notes (Signed)
Attempted report Eula ListenKelly RN to call back

## 2016-11-04 NOTE — H&P (Signed)
Date: 11/04/2016               Patient Name:  Bianca Yang MRN: [id number]  DOB: [birth year] Age / Sex: 41 y.o., female   PCP: Jaclyn Shaggy, MD         Medical Service: Internal Medicine Teaching Service         Attending Physician: Dr. Oswaldo Done, Marquita Palms, *    First Contact: Raoul Pitch, MS4 Pager: [phone]  Second Contact: Dr. Sheliah Hatch Pager: [phone]       After Hours (After 5p/  First Contact Pager: [phone]  weekends / holidays): Second Contact Pager: [phone]   Chief Complaint: Foot pain  History of Present Illness: Bianca Yang is a 41 yo woman with a PMHx significant for poorly controlled T2DM and PID who presents after 4 days of worsening left foot pain. She does not recall any injury preceding this problem and first thought it might be athlete's foot. She has never had injuries or infections in her feet before. She trims her own nails Then she saw redness and swelling between her 2nd and 3rd toe that progressively increased. This pain is severe, throbbing, and too severe to bear weight on her foot. She has tried taking ibuprofen for pain without very good relief. She denies any fevers or chills during this time. She has also noticed an increase in vaginal itching without dysuria that she thinks is similar to past yeast infections. Of note she was recently evicted from her home last month and currently homeless which is a major source of stress.  She has not been on medications for her diabetes since August 2017 due to lack of insurance or funds. She says this is the only barrier and would take them again if available. She has no recent complaints about her vision, thirst, fatigue, or decreased sensation in her feet. She does sometimes get cramps that occur once or twice daily.  Meds:  Current Meds  Medication Sig  . ibuprofen (ADVIL,MOTRIN) 600 MG tablet Take 1 tablet (600 mg total) by mouth every 6 (six) hours as needed.     Allergies: Allergies as of  11/04/2016 - Review Complete 11/04/2016  Allergen Reaction Noted  . Sulfa antibiotics Other (See Comments) 12/14/2011   Past Medical History:  Diagnosis Date  . Diabetes mellitus without complication (HCC)     Family History: Diabetes in her mother  Social History: Current smoker less than 1/2 ppd, social drinker  Review of Systems: A complete ROS was negative except as per HPI.  Physical Exam: Blood pressure 131/84, pulse 75, temperature 98.2 F (36.8 C), temperature source Oral, resp. rate 20, height 5\' 8"  (1.727 m), weight 181 lb 3.2 oz (82.2 kg), last menstrual period 10/13/2016, SpO2 92 %. GENERAL- alert, co-operative, NAD HEENT- Oral mucosa appears moist, good and intact dentition CARDIAC- RRR, no murmurs, rubs or gallops RESP- CTAB, no wheezes or crackles ABDOMEN- Soft, nontender, no guarding or rebound NEURO- Sensation intact globally, proprioception EXTREMITIES- pulse 2+, bilaterally with no edema, left foot about 3cm diameter area of confluent erythema centered between the 2/3rd digits. There is no fluctuance or drainage appreciated. No obvious ulcer or skin breakdown is present. She is able to move toes but with severe pain on passive or active ROM. Sensation is intact distal to the affected site. SKIN- Warm, dry, No rash or lesion [facility]- Appropriate speech, tearful discussing recent social events   Assessment & Plan by Problem: #Cellulitis of the left foot  This infection appears fairly mild based on local exam but she is at high risk of complications due to uncontrolled diabetes. She has no systemic symptoms but does have a leukocytosis. With no obvious purulence or fluid collection isolating a specific organism is unlikely. Xray was unremarkable and inflammatory markers suggest against osteomyelitis or disseminated infection. I will plan to treat this as a moderate severity cellulitis. We could try point of care ultrasound to better rule out an abscess if response to  treatment is slow. She has no major risk factors for MRSA or pseudomonas so will treat with ceftriaxone + metronidazole empirically. We will continue oral pain medication as a supportive measure.  #Type II diabetes Diabetes was diagnosed at hospitalization in 2017 with poor control due to lack of medication access. She is not in DKA or HHS and has no underlying cardiac or renal disease. We will start insulin here and IV fluids for her presenting hyperglycemia. Checking Hgb A1c with last measured in January. Medication access at time of discharge will be a very important challenge so we will consult social work for assistance on this.  #Pseudohyponatremia Sodium corrects to 142 for hyperglycemia around 500.  #Tobacco use disorder Active 1/2 ppd smoker. This increases her risk of peripheral vascular disease and infection so would encourage further counseling prior to discharge.  #Vaginal itching I did not complete a pelvic examination but history is suggestive of vulvovaginal candidiasis from untreated hyperglycemia. If symptoms persist in AM would examine versus give empiric antifungal therapy.  Dispo: Admit patient to Inpatient with expected length of stay greater than 2 midnights.  Signed: Fuller Plan, MD PGY-II Internal Medicine Resident Pager# [phone] 11/04/2016, 9:42 PM

## 2016-11-04 NOTE — ED Triage Notes (Signed)
Pt states her left foot hurts and is concerned she has a bad case of athletes foot. First and second left toe are red and swollen. Pt has DM and has not been on medication for almost a year.

## 2016-11-04 NOTE — Progress Notes (Signed)
Pharmacy Antibiotic Note  Bianca Yang is a 41 y.o. female admitted on 11/04/2016 with cellulitis.  Pharmacy has been consulted for vancomycin dosing. Pt is afebrile and WBC is elevated at 18.6. SCr is WNL at 0.7.   Plan: Vancomycin 1gm IV Q12H F/u renal fxn, C&S, clinical status and trough at SS  Height: 5\' 8"  (172.7 cm) Weight: 182 lb 11.2 oz (82.9 kg) IBW/kg (Calculated) : 63.9  Temp (24hrs), Avg:98.1 F (36.7 C), Min:98.1 F (36.7 C), Max:98.1 F (36.7 C)   Recent Labs Lab 11/04/16 1149 11/04/16 1201  WBC 18.6*  --   CREATININE 0.70 0.60    Estimated Creatinine Clearance: 104.5 mL/min (by C-G formula based on SCr of 0.6 mg/dL).    Allergies  Allergen Reactions  . Sulfa Antibiotics Other (See Comments)    Unknown/ childhood allergy.     Antimicrobials this admission: Vanc 5/9>>  Dose adjustments this admission: N/A  Microbiology results: Pending  Thank you for allowing pharmacy to be a part of this patient's care.  Lear Carstens, Drake LeachRachel Lynn 11/04/2016 1:35 PM

## 2016-11-04 NOTE — H&P (Signed)
Date: 11/04/2016               Patient Name:  Bianca Yang MRN: [id number]  DOB: [birth year] Age / Sex: 41 y.o., female   PCP: Jaclyn Shaggy, MD              Medical Service: Internal Medicine Teaching Service              Attending Physician: Dr. Oswaldo Done, Marquita Palms, *    First Contact: Raoul Pitch, MS4 Pager: [phone]  Second Contact: Dr. Sheliah Hatch Pager: [phone]  Third Contact Dr. N/A Pager: [phone]       After Hours (After 5p/  First Contact Pager: [phone]  weekends / holidays): Second Contact Pager: [phone]   Chief Complaint:  Foot pain  History of Present Illness: Bianca Yang is a 41 year old female with a history of poorly controlled T2DM and tobacco use who presented to the ED 5/9 with a 4-day history of left foot pain, erythema, and swelling. She began to experience a severe, throbbing pain in her left foot on 5/5, and subsequently noticed that the area between her second and third toes was red and swollen; the pain, redness, and swelling have worsened since that time. She explains that the pain is "throbbing" and very severe and that she has been unable to bear weight on the foot. She denies any lacteration or other trauma to the foot and she has not gone barefoot recently. She has no history of prior skin infections. She has not noted any other areas of erythema/swelling. She denies fever/chills, nausea/vomiting, or dysuria, although she has had vaginal itching for the past week that feels similar to her prior yeast infections.  In terms of her T2DM, this was diagnosed recently when she was hospitalized in July 2017 for PID with tubo-ovarian abscess and found to have CBGs in the 250-350 range and a HgbA1c of 10.7 at that time. She was dischagred on Novolog, but states that she never took this medication. She then, she has seen her PCP and been prescribed metformin 500mg  twice daily as well as canagliflozin 100mg  daily. She took the metformin initially, but she has  not been taking it for approximately eight months due to an inability to afford it; she is uninsured. She has not seen her PCP since August 2017. She denies numbness in her extremities but does endorse "toe cramps" that occur 1-2 times daily.  Bianca Yang has a number of current stressors; most importantly, she was evicted from her home on 4/27 and is currently homeless. She has three teenage children; her mother lives in [place] and has been able to help out with them.  Meds: Current Facility-Administered Medications  Medication Dose Route Frequency Provider Last Rate Last Dose  . vancomycin (VANCOCIN) IVPB 1000 mg/200 mL premix  1,000 mg Intravenous Q12H Rumbarger, Rachel L, RPH 200 mL/hr at 11/04/16 1410 1,000 mg at 11/04/16 1410   Current Outpatient Prescriptions  Medication Sig Dispense Refill  . ibuprofen (ADVIL,MOTRIN) 600 MG tablet Take 1 tablet (600 mg total) by mouth every 6 (six) hours as needed. 30 tablet 0    Allergies: Allergies as of 11/04/2016 - Review Complete 11/04/2016  Allergen Reaction Noted  . Sulfa antibiotics Other (See Comments) 12/14/2011   Past Medical History:  Diagnosis Date  . Diabetes mellitus without complication [facility])    Past Surgical History:  Procedure Laterality Date  . TUBAL LIGATION     History reviewed. No pertinent family history. Social History  Social History  . Marital status: Single    Spouse name: N/A  . Number of children: 3  . Years of education: N/A   Social History Main Topics  . Smoking status: Current Every Day Smoker    Packs/day: 0.50    Types: Cigarettes  . Smokeless tobacco: Never Used  . Alcohol use Yes     Prior history of heavy alcohol use, now more social use  . Drug use: Intermittent marijuana in the past  . Sexual activity: Yes   Review of Systems: Negative except per HPI.  Physical Exam: Blood pressure 119/71, pulse 78, temperature 98.1 F (36.7 C), temperature source Oral, resp. rate 18, height 5\' 8"   (1.727 m), weight 82.9 kg (182 lb 11.2 oz), last menstrual period 10/13/2016, SpO2 97 %. General: Tearful female in severe pain. HEENT: Head normocephalic, atraumatic. Pupils equal, round, and reactive to light. Moist mucus membranes. Cardiovascular: Regular rate and rhythm. No murmurs, rubs, or gallops. S1/S2 wnl. Pulmonary: Lungs clear to auscultation bilaterally. Normal work of breathing. Abdomen: Soft, non-distended. Extremities: Warm and dry. 2+ dorsalis pedis pulses bilaterally. Intact sensation to light tough in lower extremities bilaterally. 3cm area of swelling and erythema between the second and third toes of the left foot extending proximally. Nonfluctuant, nonpurulent. No visible skin breaks or ulcerations. Exquisitely tender with palpation and with movement of the digits.  Lab results: Basic Metabolic Panel:  Recent Labs  25/36/64 1149 11/04/16 1201  NA 131* 131*  K 4.6 5.6*  CL 92* 93*  CO2 26  --   GLUCOSE 497* 538*  BUN 10 18  CREATININE 0.70 0.60  CALCIUM 9.8  --    Liver Function Tests:  Recent Labs  11/04/16 1149  AST 19  ALT 12*  ALKPHOS 132*  BILITOT 0.6  PROT 7.5  ALBUMIN 3.9   CBC:  Recent Labs  11/04/16 1149 11/04/16 1201  WBC 18.6*  --   NEUTROABS 14.5*  --   HGB 14.6 16.3*  HCT 45.0 48.0*  MCV 88.8  --   PLT 268  --    CBG:  Recent Labs  11/04/16 1151  GLUCAP 491*   Urinalysis:  Recent Labs  11/04/16 1239  COLORURINE STRAW*  LABSPEC 1.030  PHURINE 7.0  GLUCOSEU >=500*  HGBUR NEGATIVE  BILIRUBINUR NEGATIVE  KETONESUR NEGATIVE  PROTEINUR NEGATIVE  NITRITE NEGATIVE  LEUKOCYTESUR NEGATIVE   Imaging results:  Dg Foot Complete Left  Result Date: 11/04/2016 IMPRESSION: Mild dorsal distal left foot soft tissue swelling. No acute osseous abnormality in the left foot. Electronically Signed   By: Delbert Phenix M.D.   On: 11/04/2016 12:31   Problem List: -- Cellulitis of the left foot -- Type II diabetes -- Tobacco use --  Hyponatremia  Assessment and Plan By Problem: #Cellulitis of the left foot Appears mild in severity based on physical exam findings; the area is nonpurulent and there is no fluctuance to suggest abscess. She is afebrile, although she does have leukocytosis to 18.6. Received one dose of vancomycin in the ED. She does not have major MRSA risk factors such as resident of long-term care, ESRD on HD, IVDU, recent hospitalization/antibiotic use, etc.She also does not have major risk factors for pseudomonas such as recent hospitalization or immunosuppression. Will treat empirically for a polymicrobial community-acquired cellulitis in the setting of uncontrolled Type II diabetes. -- Ceftriaxone 2g every 24 hours + metronidazole 500mg  every 8 hours -- Hydrocodone-acetaminophen 5-325 prn for pain control -- May consider ultrasound to definitively  rule out abscess -- Repeat CBC tomorrow a.m.  #Type II diabetes First diagnosed during last hospitalization in July 2017, although has record of CBGs in 200s as early as 2012. Has not taken any medication for 8 months due to financial issues. CBG today 491. -- Monitor CBGs -- Hgb A1c -- Sliding scale insulin -- NS infusion at 256mL/hr for 8 hours -- Diabetes coordinator consult -- Social work consult due to unstable social situation  #Tobacco use 17-year smoking history, although not a very heavy smoker (.25-.5 ppd). -- Offer nicotine patch  #Hyponatremia Likely pseudohyponatremia due to hyperglycemic state but will monitor closely. -- Repeat BMP tomorrow a.m.  FENGI: Carb Modified DVT PPX: Lovenox subcut Code Status: Full Dispo: Admitted to the Internal Medicine teaching service for further management. Anticipate 1-2 midnight stay.  This is a Psychologist, occupational Note.  The care of the patient was discussed with Dr. Dimple Casey and the assessment and plan was formulated with their assistance.  Please see their note for official documentation of the patient  encounter.   Signed: Raoul Pitch, Medical Student 11/04/2016, 2:52 PM

## 2016-11-04 NOTE — ED Notes (Signed)
Patient transported to X-ray 

## 2016-11-05 ENCOUNTER — Telehealth: Payer: Self-pay

## 2016-11-05 DIAGNOSIS — B373 Candidiasis of vulva and vagina: Secondary | ICD-10-CM

## 2016-11-05 DIAGNOSIS — E1169 Type 2 diabetes mellitus with other specified complication: Secondary | ICD-10-CM

## 2016-11-05 DIAGNOSIS — L0390 Cellulitis, unspecified: Secondary | ICD-10-CM | POA: Diagnosis present

## 2016-11-05 DIAGNOSIS — E669 Obesity, unspecified: Secondary | ICD-10-CM

## 2016-11-05 DIAGNOSIS — F1990 Other psychoactive substance use, unspecified, uncomplicated: Secondary | ICD-10-CM

## 2016-11-05 DIAGNOSIS — E11628 Type 2 diabetes mellitus with other skin complications: Secondary | ICD-10-CM

## 2016-11-05 LAB — BASIC METABOLIC PANEL
Anion gap: 7 (ref 5–15)
BUN: 7 mg/dL (ref 6–20)
CALCIUM: 8.7 mg/dL — AB (ref 8.9–10.3)
CO2: 30 mmol/L (ref 22–32)
Chloride: 98 mmol/L — ABNORMAL LOW (ref 101–111)
Creatinine, Ser: 0.44 mg/dL (ref 0.44–1.00)
GFR calc Af Amer: >60 mL/min (ref >60)
GLUCOSE: 251 mg/dL — AB (ref 65–99)
Potassium: 4.1 mmol/L (ref 3.5–5.1)
SODIUM: 135 mmol/L (ref 135–145)

## 2016-11-05 LAB — CBC
HCT: 42.9 % (ref 36.0–46.0)
Hemoglobin: 13.7 g/dL (ref 12.0–15.0)
MCH: 28.7 pg (ref 26.0–34.0)
MCHC: 31.9 g/dL (ref 30.0–36.0)
MCV: 89.9 fL (ref 78.0–100.0)
Platelets: 228 10*3/uL (ref 150–400)
RBC: 4.77 MIL/uL (ref 3.87–5.11)
RDW: 13.2 % (ref 11.5–15.5)
WBC: 14 10*3/uL — ABNORMAL HIGH (ref 4.0–10.5)

## 2016-11-05 LAB — GLUCOSE, CAPILLARY
GLUCOSE-CAPILLARY: 236 mg/dL — AB (ref 65–99)
GLUCOSE-CAPILLARY: 227 mg/dL — AB (ref 65–99)
Glucose-Capillary: 163 mg/dL — ABNORMAL HIGH (ref 65–99)
Glucose-Capillary: 228 mg/dL — ABNORMAL HIGH (ref 65–99)
Glucose-Capillary: 310 mg/dL — ABNORMAL HIGH (ref 65–99)

## 2016-11-05 LAB — HEMOGLOBIN A1C
Hgb A1c MFr Bld: 11.4 % — ABNORMAL HIGH (ref 4.8–5.6)
MEAN PLASMA GLUCOSE: 280 mg/dL

## 2016-11-05 MED ORDER — INSULIN DETEMIR 100 UNIT/ML ~~LOC~~ SOLN: 32.0000 [IU] | Freq: Every day | SUBCUTANEOUS | 1 refills | Status: DC

## 2016-11-05 MED ORDER — METFORMIN HCL ER 500 MG PO TB24: 500.0000 mg | ORAL_TABLET | Freq: Every day | ORAL | Status: DC

## 2016-11-05 MED ORDER — CEPHALEXIN 500 MG PO CAPS: 500.0000 mg | ORAL_CAPSULE | Freq: Four times a day (QID) | ORAL | 0 refills | Status: DC

## 2016-11-05 MED ORDER — FLUCONAZOLE 100 MG PO TABS
150.0000 mg | ORAL_TABLET | ORAL | Status: DC
  Administered 2016-11-05: 150 mg via ORAL
  Filled 2016-11-05 (×2): qty 2

## 2016-11-05 MED ORDER — TRUE METRIX METER W/DEVICE KIT: PACK | 0 refills | Status: DC

## 2016-11-05 MED ORDER — GLUCOSE BLOOD VI STRP: ORAL_STRIP | 1 refills | Status: DC

## 2016-11-05 MED ORDER — FLUCONAZOLE 150 MG PO TABS: 150.0000 mg | ORAL_TABLET | Freq: Once | ORAL | 0 refills | Status: AC

## 2016-11-05 MED ORDER — GABAPENTIN 300 MG PO CAPS
300.0000 mg | ORAL_CAPSULE | Freq: Three times a day (TID) | ORAL | Status: DC
  Administered 2016-11-05 – 2016-11-06 (×4): 300 mg via ORAL
  Filled 2016-11-05 (×4): qty 1

## 2016-11-05 MED ORDER — INSULIN DETEMIR 100 UNIT/ML ~~LOC~~ SOLN
32.0000 [IU] | Freq: Every day | SUBCUTANEOUS | Status: DC
  Administered 2016-11-05: 32 [IU] via SUBCUTANEOUS
  Filled 2016-11-05 (×2): qty 0.32

## 2016-11-05 MED ORDER — GABAPENTIN 300 MG PO CAPS: 300.0000 mg | ORAL_CAPSULE | Freq: Three times a day (TID) | ORAL | 0 refills | Status: DC

## 2016-11-05 MED ORDER — IBUPROFEN 800 MG PO TABS: 800.0000 mg | ORAL_TABLET | Freq: Three times a day (TID) | ORAL | 0 refills | Status: DC | PRN

## 2016-11-05 MED ORDER — HYDROCODONE-ACETAMINOPHEN 5-325 MG PO TABS: 1.0000 | ORAL_TABLET | ORAL | 0 refills | Status: DC | PRN

## 2016-11-05 MED ORDER — TRUEPLUS LANCETS 28G MISC: 1 refills | Status: DC

## 2016-11-05 MED ORDER — IBUPROFEN 400 MG PO TABS
800.0000 mg | ORAL_TABLET | Freq: Three times a day (TID) | ORAL | Status: DC | PRN
  Administered 2016-11-05 – 2016-11-06 (×2): 800 mg via ORAL
  Filled 2016-11-05 (×2): qty 2

## 2016-11-05 MED ORDER — CEPHALEXIN 500 MG PO CAPS
500.0000 mg | ORAL_CAPSULE | Freq: Four times a day (QID) | ORAL | Status: DC
  Administered 2016-11-05 – 2016-11-06 (×5): 500 mg via ORAL
  Filled 2016-11-05 (×5): qty 1

## 2016-11-05 MED ORDER — "INSULIN SYRINGE-NEEDLE U-100 31G X 15/64"" 0.5 ML MISC": 1 refills | Status: DC

## 2016-11-05 MED ORDER — LIVING WELL WITH DIABETES BOOK
Freq: Once | Status: AC
  Administered 2016-11-05: 16:00:00
  Filled 2016-11-05: qty 1

## 2016-11-05 MED ORDER — METFORMIN HCL ER 500 MG PO TB24: 500.0000 mg | ORAL_TABLET | Freq: Every day | ORAL | 2 refills | Status: DC

## 2016-11-05 MED FILL — TRUEPLUS SYR 0.3ML 31GX5/16: 31G X 5/16" | 30 days supply | Qty: 100 | Fill #0

## 2016-11-05 NOTE — Progress Notes (Signed)
Transitions of Care Pharmacy Note  Plan:  Educated on diabetic regimen Addressed concerns regarding cost and access [facility] with Georgiana ShoreLuke Van Ausdell. [facility] --------------------------------------------- Colletta MarylandChristy R Ramos is an 41 y.o. female who presents with a chief complaint left foot cellulitis. In anticipation of discharge, pharmacy has reviewed this patient's prior to admission medication history, as well as [facility] inpatient [facility]  Current medication indications, dosing, frequency, and notable side effects reviewed with patient. patient verbalized understanding of current inpatient medication regimen and is aware that the After Visit Summary when presented, will represent the most accurate medication list at discharge.   Colletta MarylandChristy R Nazir expressed concerns regarding [facility]. CDE was present and consulted case management to discuss with patient. MD will write prescriptions that [facility]. [facility] will work with her to ensure continuity of care for diabetes management.   Assessment: Understanding of regimen: fair Understanding of indications: fair Potential of compliance: fair Barriers to Obtaining Medications: Yes  Patient instructed to contact inpatient pharmacy team with further questions or concerns if needed.    Time spent preparing for discharge counseling: 90 mins. Time spent counseling patient: 15 mins.   Thank you for allowing pharmacy to be a part of this patient's care.  Drusilla Kannerevan Ardelle Haliburton, PharmD Candidate [phone]

## 2016-11-05 NOTE — Hospital Discharge Follow-Up (Signed)
Met with the patient along with Whitman Hero, RN CM . Provided the patient with her [facility] appointment information.- 11/10/16 @ 1500. She said that she does have transportation to the clinic.She plans to stay with a friend after discharge. She does have other family in the [place] area.   Also provided her with the phone # for the [facility].   She said that she has used the [facility] in the past.   She also noted that she had an orange card that expired in 05/2016. Informed her that the [facility] has a Development worker, community who can assist her with re-applying for the [employer]

## 2016-11-05 NOTE — Discharge Instructions (Addendum)
Cellulitis, Adult Cellulitis is a skin infection. The infected area is usually red and sore. This condition occurs most often in the arms and lower legs. It is very important to get treated for this condition. Follow these instructions at home:  Take over-the-counter and prescription medicines only as told by your doctor.  If you were prescribed an antibiotic medicine, take it as told by your doctor. Do not stop taking the antibiotic even if you start to feel better.  Drink enough fluid to keep your pee (urine) clear or pale yellow.  Do not touch or rub the infected area.  Raise (elevate) the infected area above the level of your heart while you are sitting or lying down.  Place warm or cold wet cloths (warm or cold compresses) on the infected area. Do this as told by your doctor.  Keep all follow-up visits as told by your doctor. This is important. These visits let your doctor make sure your infection is not getting worse. Contact a doctor if:  You have a fever.  Your symptoms do not get better after 1-2 days of treatment.  Your bone or joint under the infected area starts to hurt after the skin has healed.  Your infection comes back. This can happen in the same area or another area.  You have a swollen bump in the infected area.  You have new symptoms.  You feel ill and also have muscle aches and pains. Get help right away if:  Your symptoms get worse.  You feel very sleepy.  You throw up (vomit) or have watery poop (diarrhea) for a long time.  There are red streaks coming from the infected area.  Your red area gets larger.  Your red area turns darker. This information is not intended to replace advice given to you by your health care provider. Make sure you discuss any questions you have with your health care provider. Document Released: 12/02/2007 Document Revised: 11/21/2015 Document Reviewed: 04/24/2015 Elsevier Interactive Patient Education  2017 Elsevier  Inc.  

## 2016-11-05 NOTE — Progress Notes (Signed)
   Subjective: Still having pain in her foot. No new complaints otherwise. Has not been out of bed to ambulate yet.  Objective:  Vital signs in last 24 hours: Vitals:   11/04/16 1437 11/04/16 1515 11/04/16 2149 11/05/16 0501  BP: 119/71 131/84 117/65 120/71  Pulse: 78 75 82 85  Resp: 18 20 18 18   Temp:  98.2 F (36.8 C) 98 F (36.7 C) 97.8 F (36.6 C)  TempSrc:  Oral Oral Oral  SpO2: 97% 92% 92% 98%  Weight:  181 lb 3.2 oz (82.2 kg)    Height:  5\' 8"  (1.727 m)     General Apperance: NAD HEENT: Normocephalic, atraumatic, anicteric sclera Neck: Supple, trachea midline Lungs: Clear to auscultation bilaterally. No wheezes, rhonchi or rales. Breathing comfortably on room air Heart: Regular rate and rhythm, no murmur/rub/gallop Abdomen: Soft, nontender, nondistended, no rebound/guarding Extremities: Warm and well perfused Skin: 2cm area of erythema between 2nd and 3rd toes with no ulcerations/wound, pain with palpation Neurologic: Alert and interactive. No gross deficits.  Assessment/Plan: 41 year old woman with poorly controlled diabetes mellitus type 2 presented with 4 days of left foot pain found to have cellulitis.  Left foot cellulitis: stable/mildly improved from yesterday. Unlikely to be polymicrobial as would be seen in diabetic foot ulcer. She likely has skin breaks from her fungal infection as evidenced by her onychomycosis with subsequent cellulitis. -Transition antibiotics from ceftriaxone and metronidazole to cephalexin 500mg  q6hr -Continue Norco 5/325mg  1-2 tablets q4hr prn -Start gabapentin 300mg  TID. May have some diabetic neuropathy and could help with pain control.   Uncontrolled type 2 DM: CBG in the 236 fasting. -Will need to restart metformin on discharge. -Consider restarting basal insulin. Pharmacy discussing with her discharge regimen. -SSI  Complicated vulvovaginal candidiasis: Complained of vaginal itching on admission. -Fluconazole 150mg  q72hr x 2  doses  Onychomycosis: Will need to see a podiatrist as outpatient  FEN: carb mod VTE ppx: Lovenox Code: Full  Dispo: Anticipated discharge in approximately 0-1 day(s).   Lora PaulaKrall, Jennifer T, MD 11/05/2016, 10:25 AM Pager: [phone]

## 2016-11-05 NOTE — Progress Notes (Signed)
Subjective: Ms. Dant is resting in bed. She states that her left foot pain is still severe and not improved from yesterday, although she does think that the swelling has decreased. She is apprehensive about attempting to walk on the foot today. She has not had fevers/chills.  Objective: Vital signs in last 24 hours: Vitals:   11/04/16 1437 11/04/16 1515 11/04/16 2149 11/05/16 0501  BP: 119/71 131/84 117/65 120/71  Pulse: 78 75 82 85  Resp: 18 20 18 18   Temp:  98.2 F (36.8 C) 98 F (36.7 C) 97.8 F (36.6 C)  TempSrc:  Oral Oral Oral  SpO2: 97% 92% 92% 98%  Weight:  82.2 kg (181 lb 3.2 oz)    Height:  5\' 8"  (1.727 m)     Physical Exam General: Groggy-appearing female resting in bed. HEENT: Head normocephalic, atraumatic. Moist mucus membranes. Cardiovascular: Regular rate and rhythm. No murmurs, rubs, or gallops. S1/S2 wnl. Pulmonary: Lungs clear to auscultation bilaterally. Normal work of breathing. Abdomen: Soft, non-distended. Extremities: Warm and dry. 2+ dorsalis pedis pulse in the left foot. 2cm area of swelling and erythema between second and third toes of the left foot extending proximally, improved from yesterday. Remains nonfluctuant, nonpurulent. Remains exquisitely tender to palpation. No visible skin breaks or ulcerations, but there is onychomycosis of first and second toenails of left foot.  Lab Results: Basic Metabolic Panel:  Recent Labs Lab 11/04/16 1149 11/04/16 1201 11/05/16 0442  NA 131* 131* 135  K 4.6 5.6* 4.1  CL 92* 93* 98*  CO2 26  --  30  GLUCOSE 497* 538* 251*  BUN 10 18 7   CREATININE 0.70 0.60 0.44  CALCIUM 9.8  --  8.7*   Liver Function Tests:  Recent Labs Lab 11/04/16 1149  AST 19  ALT 12*  ALKPHOS 132*  BILITOT 0.6  PROT 7.5  ALBUMIN 3.9   CBC:  Recent Labs Lab 11/04/16 1149 11/04/16 1201 11/05/16 0442  WBC 18.6*  --  14.0*  NEUTROABS 14.5*  --   --   HGB 14.6 16.3* 13.7  HCT 45.0 48.0* 42.9  MCV 88.8  --  89.9    PLT 268  --  228   CBG:  Recent Labs Lab 11/04/16 1151 11/04/16 1647 11/04/16 2155 11/05/16 0510 11/05/16 0753  GLUCAP 491* 309* 200* 236* 227*   Hemoglobin A1C:  Recent Labs Lab 11/04/16 1541  HGBA1C 11.4*   Urinalysis:  Recent Labs Lab 11/04/16 1239  COLORURINE STRAW*  LABSPEC 1.030  PHURINE 7.0  GLUCOSEU >=500*  HGBUR NEGATIVE  BILIRUBINUR NEGATIVE  KETONESUR NEGATIVE  PROTEINUR NEGATIVE  NITRITE NEGATIVE  LEUKOCYTESUR NEGATIVE   Studies/Results: Dg Foot Complete Left  Result Date: 11/04/2016 IMPRESSION: Mild dorsal distal left foot soft tissue swelling. No acute osseous abnormality in the left foot. Electronically Signed   By: Delbert Phenix M.D.   On: 11/04/2016 12:31   Problem List: -- Cellulitis of the left foot -- Type II diabetes -- Tobacco use -- Vaginal itching  Assessment and Plan By Problem: #Cellulitis of the left foot Continues to appear mild to moderate, with improvement from yesterday in terms of physical exam findings and downtrending leukocytosis. She has been afebrile since admission. She does not have a visible ulcer to suggest diabetic foot infection; it is likely that this is just a cellulitis with onychomycosis as possible source. It would therefore be reasonable to further narrow antibiotics from ceftriaxone + metronidazole to cephalexin due to lack of need for gram negative and anaerobic  coverage. Given that this is an oral medication, if she is able to bear weight on the foot and walk today, she is stable for discharge. -- Day 1 of cephalexin, Day 2 of antibiotics -- Try gabapentin for pain given that her pain is not currently well controlled  #Type II diabetes Diagnosed during recent hospitalization for PID in July 2017, but has not taken any medications for 8 months due to financial issues. HgbA1c 10.7 when last checked in July 2017; 11.4 on 5/9. CBGs currently in 200s on insulin. She will need significant support in ensuring that she  is able to obtain her medications and follow-up with a PCP moving forward. -- Continue insulin therapy -- Monitor CBGs q4 -- Diabetes coordinator consult -- Social work consult due to unstable social situation  #Tobacco use 17-year smoking history, placing her at increased risk of peripheral vascular disease and infection. -- Counsel regarding smoking cessation and offer nicotine patches upon discharge  #Vaginal itching Pelvic exam deferred today and this was not brought up again this morning, but will consider empiric treatment of vulvovaginal candidiasis if she is still having this problem this afternoon. -- Reassess this afternoon  FENGI: Carb Modified DVT PPX: Subcut Lovenox Code: Full Dispo: Admitted to Internal Medicine teaching service for management of left foot cellulitis. Possible discharge home today on oral antibiotics if she is able to bear weight on the foot.  This is a Psychologist, occupationalMedical Student Note.  The care of the patient was discussed with Dr. Isabella BowensKrall and the assessment and plan formulated with their assistance.  Please see their attached note for official documentation of the daily encounter.   LOS: 1 day   Raoul PitchVerma, Belita Warsame, Medical Student 11/05/2016, 10:02 AM

## 2016-11-05 NOTE — Progress Notes (Signed)
Spoke with patient about her diabetes.  States that she was diagnosed in July, 2017, but could not afford to buy the medication needed.  She has just recently been evicted from her home and is considered homeless. States that she will sleep at her friend's house. She cannot work right now due to her foot problems and pain.   Pharmacy resident was present in the room and spoke with patient about [facility] and taking Levemir and Metformin.  Patient states that she is willing to take the insulin if she can afford it. States that she does not have transportation to [facility]. Spoke with case manager in regards to getting her transportation.  Spoke with staff RNs about checking patient on giving insulin injections, checking blood sugars at home. They will treat her as new onset diabetes. [facility] Will continue to follow patient while in the hospital.   Smith MinceKendra Arielys Wandersee RN BSN CDE Diabetes Coordinator Pager: [phone]  8am-5pm

## 2016-11-05 NOTE — Telephone Encounter (Signed)
Message [facility] from [facility]  noting that the pharmacy resident at the hospital contacted  him regarding follow up for the patient after discharge.  [facility] 11/10/16 2 1500 and the information was placed on the AVS.  Update provided to Burney GauzeAngela Cole,RN CM

## 2016-11-05 NOTE — Care Management Note (Addendum)
Case Management Note  Patient Details  Name: Bianca Yang MRN: [id number] Date of Birth: July 07, 1975  Subjective/Objective:                 Pt with a PMHx significant for poorly controlled T2DM and PID who presents after 4 days of worsening left foot pain/ cellulitis. Homeless/ jobless, states recently became unemployed and evicted. Pt states @ d/c she plans to reside with best girlfriend, temporarily. Pt states independent with ADL's and no DME usage PTA.  PCP: WU:[id number]  Action/Plan: Plan is to d/c when medically stable.Pt states daughter will provide transportation from hospital once d/c. Pt with post [facility], refer to AVS. Match Letter given and explained to pt to assist with medication needs, pt without insurance, recently became unemployed and states has no money to pay for prescription medications.  Expected Discharge Date:  11/05/16               Expected Discharge Plan:  Home/Self Care (Recently unemployed and evicted.)  In-House Referral:  Clinical Social Work (homeless)  Discharge planning Services  CM Consult, Follow-up appt scheduled, [facility], [facility]  Post Acute Care Choice:    Choice offered to:     DME Arranged:    DME Agency:     HH Arranged:    HH Agency:     Status of Service:  Completed, signed off  If discussed at [employer] Meetings, dates discussed:    Additional Comments:  Epifanio LeschesCole, Marlia Schewe Hudson, RN 11/05/2016, 6:26 PM

## 2016-11-06 ENCOUNTER — Observation Stay (HOSPITAL_COMMUNITY): Payer: Self-pay

## 2016-11-06 ENCOUNTER — Observation Stay (HOSPITAL_BASED_OUTPATIENT_CLINIC_OR_DEPARTMENT_OTHER): Payer: Self-pay

## 2016-11-06 DIAGNOSIS — Z794 Long term (current) use of insulin: Secondary | ICD-10-CM

## 2016-11-06 DIAGNOSIS — E11621 Type 2 diabetes mellitus with foot ulcer: Secondary | ICD-10-CM

## 2016-11-06 DIAGNOSIS — B9689 Other specified bacterial agents as the cause of diseases classified elsewhere: Secondary | ICD-10-CM

## 2016-11-06 DIAGNOSIS — E11628 Type 2 diabetes mellitus with other skin complications: Secondary | ICD-10-CM

## 2016-11-06 LAB — GLUCOSE, CAPILLARY
Glucose-Capillary: 129 mg/dL — ABNORMAL HIGH (ref 65–99)
Glucose-Capillary: 190 mg/dL — ABNORMAL HIGH (ref 65–99)

## 2016-11-06 LAB — GC/CHLAMYDIA PROBE AMP (~~LOC~~) NOT AT ARMC
Chlamydia: NEGATIVE
Neisseria Gonorrhea: NEGATIVE

## 2016-11-06 MED ORDER — METFORMIN HCL ER 500 MG PO TB24: 500.0000 mg | ORAL_TABLET | Freq: Every day | ORAL | 1 refills | Status: DC

## 2016-11-06 MED ORDER — INSULIN DETEMIR 100 UNIT/ML ~~LOC~~ SOLN: 32.0000 [IU] | Freq: Every day | SUBCUTANEOUS | 1 refills | Status: DC

## 2016-11-06 MED ORDER — GADOBENATE DIMEGLUMINE 529 MG/ML IV SOLN
20.0000 mL | Freq: Once | INTRAVENOUS | Status: AC
  Administered 2016-11-06: 18 mL via INTRAVENOUS

## 2016-11-06 MED ORDER — DOXYCYCLINE HYCLATE 100 MG PO CAPS: 100.0000 mg | ORAL_CAPSULE | Freq: Two times a day (BID) | ORAL | 0 refills | Status: DC

## 2016-11-06 MED FILL — ?DOXYCYCLINE HYCLATE 100 MG: 100 | 14 days supply | Qty: 28 | Fill #0

## 2016-11-06 MED FILL — TRUE METRIX TEST STRIP: 25 days supply | Qty: 100 | Fill #0

## 2016-11-06 MED FILL — GABAPENTIN 300 MG CAPSULE: 300 | 14 days supply | Qty: 42 | Fill #0

## 2016-11-06 MED FILL — METFORMIN HCL ER 500 MG TAB: 500 | 30 days supply | Qty: 30 | Fill #0

## 2016-11-06 MED FILL — TRUEplus LANCETS 28G MISC: 30 days supply | Qty: 100 | Fill #0

## 2016-11-06 MED FILL — FLUCONAZOLE 150 MG TABLET: 150 | 1 days supply | Qty: 1 | Fill #0

## 2016-11-06 MED FILL — LEVEMIR 100 UNITS/ML VIAL: 100 | 30 days supply | Qty: 10 | Fill #0

## 2016-11-06 MED FILL — IBUPROFEN 800 MG TABLET: 800 | 10 days supply | Qty: 30 | Fill #0

## 2016-11-06 MED FILL — TRUE METRIX BLOOD GLUCOSE M: W/DEVICE | 30 days supply | Qty: 1 | Fill #0

## 2016-11-06 NOTE — Progress Notes (Signed)
Subjective: Bianca Yang reports feeling better this morning. She still has some pain with ambulation, but it has improved and she is able to bear weight on the foot. Visiting family notes that she has a history of substance abuse and should not be prescribed opiates upon discharge.  Objective: Vital signs in last 24 hours: Vitals:   11/05/16 0501 11/05/16 1441 11/05/16 2029 11/06/16 0532  BP: 120/71 109/69 126/65 104/72  Pulse: 85 66 80 90  Resp: _0 Temp: 97.8 F (36.6 C) 98.4 F (36.9 C)  98.4 F (36.9 C)  TempSrc: Oral Oral    SpO2: 98% 96% 97% 97%  Weight:      Height:       Physical Exam General: Groggy-appearing female resting comfortably in bed. HEENT: Head normocephalic, atraumatic. Moist mucus membranes. Cardiovascular: Regular rate and rhythm. No murmurs, rubs, or gallops. Pulmonary: Lungs clear to auscultation bilaterally. Normal work of breathing. Abdomen: Soft, non-distended. Extremities: Warm and dry. 2+ dorsalis pedis pulse in the left foot. 2cm area of swelling and erythema between second and third toes of the left foot extending proximally. Same in size as yesterday but erythema has increased. Tender to palpation. Onychomycosis of first and second toenails of left foot.  CBG:  Recent Labs Lab 11/04/16 2155 11/05/16 0510 11/05/16 0753 11/05/16 1209 11/05/16 1655 11/05/16 2026  GLUCAP 200* 236* 227* 163* 228* 310*   Hemoglobin A1C:  Recent Labs Lab 11/04/16 1541  HGBA1C 11.4*   MRI 5/11: IMPRESSION: 1. Subcutaneous edema and enhancement in the forefoot and toes compatible with cellulitis. There is also edema and enhancement tracking within along the musculature of the forefoot favoring myositis. 2. Small incipient abscess in the vicinity of the web space between the second and third toes dorsally ; the irregular rim enhancing fluid collection in this vicinity measures about 0.3 cc in volume. 3. No definite forefoot  osteomyelitis.  Problem List: -- Cellulitis of the left foot -- Type II diabetes -- Tobacco use -- Vaginal itching  Assessment and Plan By Problem: #Cellulitis of the left foot Continues to appear mild to moderate, although slightly more erythematous than yesterday. Afebrile since admission. MRI without evidence of osteomyelitis. Most likely a cellulitis with onychomycosis as possible source, but increasing erythema warrants coverage for Staph. -- Day 2 of cephalexin, Day 3 of antibiotics; transition from cephalexin to doxycycline today in order to cover Staph -- Discharge on ibuprofen and gabapentin for pain control; no opiates given history of substance abuse -- Outpatient follow-up for treatment of onychomycosis  #Type II diabetes Diagnosed during recent hospitalization for PID in July 2017, but has not taken any medications for 8 months due to financial issues. HgbA1c 10.7 when last checked in July 2017; 11.4 on 5/9. CBGs currently in 200s on insulin. She has met with social work and care management and has follow-up scheduled with [place] for 11/10/16 at 3pm. -- Continue insulin therapy -- Monitor CBGs q4 -- Discharge on metformin and basal insulin with close follow-up  #Tobacco use 17-year smoking history, placing her at increased risk of peripheral vascular disease and infection. -- Counsel regarding smoking cessation -- Offer nicotine patches upon discharge  #Vaginal itching -- S/p one dose of fluconazole; will require second dose on 5/13 following discharge -- Gonorrhea/Chlamydia testing pending; will follow-up with results  FENGI: Carb Modified DVT PPX: Subcut Lovenox Code: Full Dispo: Admitted to Internal Medicine teaching service for management of left foot cellulitis. Stable for discharge home today given that  she is on oral antibiotics and is able to bear weight on the foot.  This is a Careers information officer Note.  The care of the patient was discussed with Dr. Benjamine Mola and the  assessment and plan formulated with their assistance.  Please see their attached note for official documentation of the daily encounter.   LOS: 1 day   Bianca Yang, Medical Student 11/06/2016, 8:06 AM

## 2016-11-06 NOTE — Progress Notes (Signed)
THIS NURSE TOSSED THE SIGNED SCRIPT FOR HYDROCODONE INTO THE SECURE SHREDDER.

## 2016-11-06 NOTE — Progress Notes (Signed)
   Subjective: Her foot pain is partially improved compared to yesterday and she has been able to walk without assistance. Pain is increased after walking on her foot for long durations. She continues to deny any fevers or systemic complaints. An MRI was obtained this morning uneventfully.  Objective:  Vital signs in last 24 hours: Vitals:   11/05/16 0501 11/05/16 1441 11/05/16 2029 11/06/16 0532  BP: 120/71 109/69 126/65 104/72  Pulse: 85 66 80 90  Resp: 18 16 18 18   Temp: 97.8 F (36.6 C) 98.4 F (36.9 C)  98.4 F (36.9 C)  TempSrc: Oral Oral    SpO2: 98% 96% 97% 97%  Weight:      Height:       General Apperance: NAD Lungs: Clear to auscultation bilaterally with normal WOB Heart: Regular rate and rhythm, no murmur/rub/gallop Extremities: Warm and well perfused, 2+ DP pulses Skin: 2cm area of erythema between 2nd and 3rd toes without ulcerations or visible open wounds Neurologic: Alert and interactive  Assessment/Plan: 41 year old woman with poorly controlled diabetes mellitus type 2 presented with 4 days of left foot pain found to have cellulitis.  #Left foot cellulitis Her pain is partially improved today although erythema appears equally extensive as yesterday. MRI result shows no osteomyelitis but very small fluid collection and tracking is more concerning for myositis or purulent cellulitis than on superficial exam. I do not think this is a case of pyomyositis requiring aggressive, parenteral treatment but I will recommend a longer treatment on oral antibiotics. We will change empiric treatment to doxycycline for better staphylococcal coverage for 14 days of additional treatment course after discharge with follow up on the 15th to assess clinical progress.  #Uncontrolled type 2 DM Poorly controlled PTA and in 200s here. She will be discharged with metformin and once daily basal insulin. She will need continued education and testing supplies for this and has follow up with [facility].  #Multiple substance use disorder Ms. Gudgel's mother expressed concerns to staff today about a history of recent illicit drug use including cocaine, opioids, or "whatever she could get her hands on." We will recommend non-narcotic pain management options at discharge. I will also make a note of this to her following up provider   #Complicated vulvovaginal candidiasis Complained of vaginal itching on admission and we are empirically treating with 2 doses of fluconazole next would be scheduled 5/13  #Onychomycosis Skin breaks due to ongoing fungal infection probably predisposed for this cellulitis. Eradication would require extended treatment. She will need to see a podiatrist as outpatient  #Homelessness She is planning to continue staying with a friend at time of discharge. She lacks personal transportation and will need assistance with this for clinic F/U and case management is on board  Dispo: Anticipated discharge later today   Fuller Planhristopher W Rice, MD PGY-II Internal Medicine Resident Pager# [phone] 11/06/2016, 12:11 PM

## 2016-11-06 NOTE — Progress Notes (Signed)
Inpatient Diabetes Program Recommendations  AACE/ADA: New Consensus Statement on Inpatient Glycemic Control (2015)  Target Ranges:  Prepandial:   less than 140 mg/dL      Peak postprandial:   less than 180 mg/dL (1-2 hours)      Critically ill patients:  140 - 180 mg/dL   Results for Bianca Yang, Bianca Yang (MRN [id number]) as of 11/06/2016 12:58  Ref. Range 11/04/2016 15:41  Hemoglobin A1C Latest Ref Range: 4.8 - 5.6 % 11.4 (H)    Spoke with pt today for follow-up.  Patient to be discharged home on Levemir once daily + Metformin BID.  Asked RNs to please make sure patient able to draw up and give self an insulin injection prior to discharge (patient stated she has been shown how to give insulin injections before).  Reviewed signs and symptoms of Hyperglycemia and Hypoglycemia and proper treatment of both.  Also discussed Levemir and Metformin with patient (how/ when to take, side effects, etc).  Mom and sister were present at bedside for discussion.  Mom very engaged but patient and sister very disengaged during conversation.    --Will follow patient during hospitalization--  Ambrose FinlandJeannine Johnston Camerin Jimenez RN, MSN, CDE Diabetes Coordinator Inpatient Glycemic Control Team Team Pager: [phone] (8a-5p)

## 2016-11-06 NOTE — Discharge Summary (Signed)
Name: Bianca Yang MRN: [id number] DOB: 09/14/1975 41 y.o. PCP: Arnoldo Morale, MD  Date of Admission: 11/04/2016 11:03 AM Date of Discharge: 11/06/2016 Attending Physician: Axel Filler, *  Discharge Diagnosis: Principal Problem:  Cellulitis in diabetic foot [facility]) Active Problems:   Diabetes mellitus type 2 in obese [facility])   Discharge Medications: Allergies as of 11/06/2016      Reactions   Sulfa Antibiotics Other (See Comments)   Unknown/ childhood allergy.       Medication List    TAKE these medications   doxycycline 100 MG capsule Commonly known as:  VIBRAMYCIN Take 1 capsule (100 mg total) by mouth 2 (two) times daily.   fluconazole 150 MG tablet Commonly known as:  DIFLUCAN Take 1 tablet (150 mg total) by mouth once. Start taking on:  11/08/2016   gabapentin 300 MG capsule Commonly known as:  NEURONTIN Take 1 capsule (300 mg total) by mouth 3 (three) times daily.   glucose blood test strip Commonly known as:  TRUE METRIX BLOOD GLUCOSE TEST Use up to four times daily as directed to monitor blood glucose. Dx code E11.65   HYDROcodone-acetaminophen 5-325 MG tablet Commonly known as:  NORCO/VICODIN Take 1-2 tablets by mouth every 4 (four) hours as needed for moderate pain.   ibuprofen 800 MG tablet Commonly known as:  ADVIL,MOTRIN Take 1 tablet (800 mg total) by mouth every 8 (eight) hours as needed for moderate pain. What changed:  medication strength  how much to take  when to take this  reasons to take this   insulin detemir 100 UNIT/ML injection Commonly known as:  LEVEMIR Inject 0.32 mLs (32 Units total) into the skin at bedtime.   Insulin Syringe-Needle U-100 31G X 15/64" 0.5 ML Misc Use to administer insulin once a day. The patient is insulin requiring, ICD 10 code E11.65.   metFORMIN 500 MG 24 hr tablet Commonly known as:  GLUCOPHAGE-XR Take 1 tablet (500 mg total) by mouth daily with breakfast.   TRUE METRIX METER w/Device  Kit Dispense based on patient/insurance/pharmacy preference. Use up to four times daily as directed to monitor blood glucose. Dx code E11.65   TRUEPLUS LANCETS 28G Misc Use up to four times daily as directed to monitor blood glucose. Dx code E11.65       Disposition and follow-up:   BiancaAfsana R Yang was discharged from [facility] in [place] condition.  At the hospital follow up visit please address:  1.  Left foot cellulitis in poorly controlled diabetic patient: Bianca Yang was discharged on doxycycline to complete a 2 week course of abtx due to small abscess and myositis. Please assess for continued clinical improvement.  2.  Type 2 diabetes: Bianca Yang was discharged with metformin and once daily Levemir for treatment and educated on their use. Please ask about medication use and monitoring.  Follow-up Appointments: Follow-up Information    [place]. Go on 11/10/2016.   Why:  at 3:00pm for a hospital follow up appointment with Dr Janne Napoleon. Please bring all of your medications with you to the appointment.  Contact information: [place] [phone] [place] Hospital Course by problem list: 1. Left foot cellulitis Bianca Yang presented to the ED on 5/9 with a four-day history of worsening left foot pain, erythema, and swelling. Bianca Yang was afebrile with a 3-cm diameter area of erythema and swelling between the second and third toes  of the left foot without fluctuance or drainage. The most likely source appeared to be onychomycosis of the first and second toes of the left foot. Antibiotic treatment was narrowed to oral cephalexin more in line with a simple cellulitis. Due to continued severe pain an MRI was obtained on 5/11 that showed a 0.3cc abscess and evidence of myositis without osteomyelitis. Her antibiotic treatment was adjusted to doxycycline to cover for staphylococci to be continued after  discharge. Her pain and ability to ambulate improved over the course of her hospital stay. Her onychomycosis will require outpatient treatment and follow-up.  2. Type II diabetes Bianca Yang has not taken any medications for 8 months due to financial issues and lack of health insurance. Hgb A1c was checked and found to be 11.4%. Bianca Yang was started on insulin with improvement in her CBGs to the 200s range. Diabetic education was provided by staff. Bianca Yang was discharged with prescriptions for levemir and metformin.  3. Multiple substance use disorder [name] was counseled regarding smoking cessation upon discharge. Her family reported that Bianca Yang also has a history of substance abuse (cocaine, opiates, etc.) so narcotic analgesics were not prescribed at discharge.  4. Complicated vulvovaginal candidiasis Bianca Yang was empiricallyprescribed fluconazole for pruritis and discharge, taken on 5/10 inpatient and repeat dose ordered to pharmacy for 5/13. Gonorrhea/chlamydia testing was negative.  Discharge Vitals:   BP 104/72 (BP Location: Left Arm)   Pulse 90   Temp 98.4 F (36.9 C)   Resp 18   Ht 5' 8"  (1.727 m)   Wt 181 lb 3.2 oz (82.2 kg)   LMP 10/13/2016 (Approximate)   SpO2 97%   BMI 27.55 kg/m   Pertinent Labs, Studies, and Procedures:  MRI foot on 5/11 demonstrated very small <1cm fluid collection and myositis proximal in the left foot.  Discharge Instructions: Discharge Instructions    Call MD for:    Complete by:  As directed    Temperature > 101   Call MD for:  persistant dizziness or light-headedness    Complete by:  As directed    Call MD for:  persistant nausea and vomiting    Complete by:  As directed    Call MD for:  redness, tenderness, or signs of infection (pain, swelling, redness, odor or green/yellow discharge around incision site)    Complete by:  As directed    Diet Carb Modified    Complete by:  As directed    Discharge instructions    Complete by:  As directed    Take your  antibiotic doxycycline 145m 1 pill by mouth twice daily until you run out.  Take ibuprofen 8059mevery 8 hours as needed for pain. Take tylenol 65055mvery 6 hours as needed for pain as well. Your pain should continue to improve with treatment of your infection  For your diabetes: Take Levemir 32 units once a day Take metformin once a day with breakfast. Each week, increase by one tablet until you reach the goal of 4 tablets (2000m85mtal) by mouth daily with breakfast.  Please seek medical attention if you have new or worsening symptoms. Please follow up with your PCP as scheduled.   Increase activity slowly    Complete by:  As directed       Signed: ChriCollier Salina PGY-II Internal Medicine Resident Pager# [phone]/2018, 12:12 PM

## 2016-11-06 NOTE — Progress Notes (Signed)
VASCULAR LAB PRELIMINARY  ARTERIAL  ABI completed: Right ABI of 1.2 and left ABI of 1.18 are suggestive of arterial flow within normal limits at rest.   RIGHT    LEFT    PRESSURE WAVEFORM  PRESSURE WAVEFORM  BRACHIAL 117 Triphasic BRACHIAL 119 Triphasic  DP 138 Triphasic DP 140 Triphasic  PT 143 Triphasic PT 121 Triphasic    RIGHT LEFT  ABI 1.2 1.18     Bianca StainGregory J Shira Bobst, RVT 11/06/2016, 12:30 PM

## 2016-11-06 NOTE — Progress Notes (Signed)
CSW received consult regarding transportation needs. Patient reports not having any way to get home. CSW provided bus pass.  CSW signing off.  Osborne Cascoadia Ramadan Couey LCSWA [phone]

## 2016-11-09 LAB — CULTURE, BLOOD (ROUTINE X 2)
Culture: NO GROWTH
Culture: NO GROWTH
Special Requests: ADEQUATE
Special Requests: ADEQUATE

## 2016-11-10 ENCOUNTER — Inpatient Hospital Stay: Payer: Self-pay | Admitting: Internal Medicine

## 2016-11-12 ENCOUNTER — Telehealth: Payer: Self-pay | Admitting: *Deleted

## 2016-11-12 NOTE — Telephone Encounter (Signed)
Patient Triage Assessment Form Todays Date: 11/12/2016 Name:  Bianca AriasChristy Yang  DOB: 10-09-1975 Reason for walkin: cellulitis of the foot,  When did your symptoms start? 10/30/2016  Please list symptoms: cyst under my skin, ruptured. In a lot of pain and my foot continues to swell.   Are you having pain: Yes 10/10  Assessment Vital Signs:  T: 98.3 P: 81 R: 16 SpO2: 95 BP: 108/72  Pt is alert and oriented x 4. Pt is ambulatory but weight bearing is limited due to pain. Mild amount of clear drainage to wound. Wound is located on plantar  surface and anterior surface of left foot, between digits 2 and 3. Mild edema and strong palpable pulse in left foot. Cap RF < than 3 seconds. Wound is white on the anterior surface of left foot. No odor assessed.  Plan  Notified provider Cena BentonA. McClung PA for verification to place on schedule. Appointment scheduled for Friday May 18th @ 2:00pm. Pt verbalized understanding. Instructed to elevate  extremity, keep wound clean and dry. Dressing supplies handed to patient. Teach back method for dressing change, pt verbalized understanding.

## 2016-11-13 ENCOUNTER — Ambulatory Visit: Payer: Self-pay | Attending: Family Medicine | Admitting: Physician Assistant

## 2016-11-13 ENCOUNTER — Encounter: Payer: Self-pay | Admitting: Physician Assistant

## 2016-11-13 VITALS — BP 112/74 | HR 88 | Temp 98.2°F | Resp 16 | Wt 184.0 lb

## 2016-11-13 DIAGNOSIS — L03116 Cellulitis of left lower limb: Secondary | ICD-10-CM | POA: Insufficient documentation

## 2016-11-13 DIAGNOSIS — E669 Obesity, unspecified: Secondary | ICD-10-CM | POA: Insufficient documentation

## 2016-11-13 DIAGNOSIS — M79675 Pain in left toe(s): Secondary | ICD-10-CM

## 2016-11-13 DIAGNOSIS — E1165 Type 2 diabetes mellitus with hyperglycemia: Secondary | ICD-10-CM | POA: Insufficient documentation

## 2016-11-13 DIAGNOSIS — E1169 Type 2 diabetes mellitus with other specified complication: Secondary | ICD-10-CM

## 2016-11-13 DIAGNOSIS — B373 Candidiasis of vulva and vagina: Secondary | ICD-10-CM | POA: Insufficient documentation

## 2016-11-13 DIAGNOSIS — B351 Tinea unguium: Secondary | ICD-10-CM | POA: Insufficient documentation

## 2016-11-13 DIAGNOSIS — Z794 Long term (current) use of insulin: Secondary | ICD-10-CM | POA: Insufficient documentation

## 2016-11-13 DIAGNOSIS — F1990 Other psychoactive substance use, unspecified, uncomplicated: Secondary | ICD-10-CM | POA: Insufficient documentation

## 2016-11-13 LAB — GLUCOSE, POCT (MANUAL RESULT ENTRY)
POC GLUCOSE: 254 mg/dL — AB (ref 70–99)
POC Glucose: 344 mg/dl — AB (ref 70–99)

## 2016-11-13 MED ORDER — INSULIN ASPART 100 UNIT/ML ~~LOC~~ SOLN: 10.0000 [IU] | Freq: Once | SUBCUTANEOUS | Status: DC

## 2016-11-13 MED ORDER — ACETAMINOPHEN-CODEINE #3 300-30 MG PO TABS: 1.0000 | ORAL_TABLET | ORAL | 0 refills | Status: DC | PRN

## 2016-11-13 MED ORDER — METFORMIN HCL ER 500 MG PO TB24: ORAL_TABLET | ORAL | 1 refills | Status: DC

## 2016-11-13 NOTE — Patient Instructions (Signed)
Check blood sugars before each meal and at bedtime and record and bring to next visit.

## 2016-11-13 NOTE — Progress Notes (Signed)
Patient ID: Bianca Yang, female   DOB: 07-Jul-1975, 41 y.o.   MRN: [id number]     Bianca Yang, is a 41 y.o. female  XKG:[id number]  [id number]:[id number]  DOB - 26-Dec-1975  Subjective:  Chief Complaint and HPI: Bianca Yang is a 41 y.o. female here today for a follow up visit After being in the hospital 11/04/2016-11/06/2016 for foot cellulitis, myositis, and uncontrolled diabetes.  She had been off diabetes medications for a while and was started on Metformin and Levemir.  She was discharged on Doxycycline for cellulitis coverage.  She has 7 more days on Doxycycline.  She feels the area is improving but it hurts bc she has to be up on it a lot.  She has a roommate and does most of the household chores bc she is unable to work a regular job right now.  No f/c.  She does feel the area is improving  She says she is checking her blood sugars twice a day but can't give me numbers.  She says she has started taking her levemir and metformin now.    Hospital notes reviewed as follows from chart: Hospital Course by problem list: 1. Left foot cellulitis Ms. Short presented to the ED on 5/9 with a four-day history of worsening left foot pain, erythema, and swelling. She was afebrile with a 3-cm diameter area of erythema and swelling between the second and third toes of the left foot without fluctuance or drainage. The most likely source appeared to be onychomycosis of the first and second toes of the left foot. Antibiotic treatment was narrowed to oral cephalexin more in line with a simple cellulitis. Due to continued severe pain an MRI was obtained on 5/11 that showed a 0.3cc abscess and evidence of myositis without osteomyelitis. Her antibiotic treatment was adjusted to doxycycline to cover for staphylococci to be continued after discharge. Her pain and ability to ambulate improved over the course of her hospital stay. Her onychomycosis will require outpatient treatment and follow-up.  2. Type II  diabetes She has not taken any medications for 8 months due to financial issues and lack of health insurance. Hgb A1c was checked and found to be 11.4%. She was started on insulin with improvement in her CBGs to the 200s range. Diabetic education was provided by staff. She was discharged with prescriptions for levemir and metformin.  3. Multiple substance use disorder Ms. Fesperman was counseled regarding smoking cessation upon discharge. Her family reported that she also has a history of substance abuse (cocaine, opiates, etc.) so narcotic analgesics were not prescribed at discharge.  4. Complicated vulvovaginal candidiasis She was empiricallyprescribed fluconazole for pruritis and discharge, taken on 5/10 inpatient and repeat dose ordered to pharmacy for 5/13. Gonorrhea/chlamydia testing was negative. Marland Kitchen   ED/Hospital notes reviewed.   Social History: Family history:  ROS:   Constitutional:  No f/c, No night sweats, No unexplained weight loss. EENT:  No vision changes, No blurry vision, No hearing changes. No mouth, throat, or ear problems.  Respiratory: No cough, No SOB Cardiac: No CP, no palpitations GI:  No abd pain, No N/V/D. GU: No Urinary s/sx Musculoskeletal: +foot pain Neuro: No headache, no dizziness, no motor weakness.  Skin: No rash Endocrine:  No polydipsia. No polyuria.  Psych: Denies SI/HI  No problems updated.  ALLERGIES: Allergies  Allergen Reactions  . Sulfa Antibiotics Other (See Comments)    Unknown/ childhood allergy.     PAST MEDICAL HISTORY: Past Medical History:  Diagnosis Date  .  Diabetes mellitus without complication ([place])     MEDICATIONS AT HOME: Prior to Admission medications   Medication Sig Start Date End Date Taking? Authorizing Provider  acetaminophen-codeine (TYLENOL #3) 300-30 MG tablet Take 1 tablet by mouth every 4 (four) hours as needed for moderate pain. 11/13/16   Argentina Donovan, PA-C  Blood Glucose Monitoring Suppl (TRUE METRIX  METER) w/Device KIT Dispense based on patient/insurance/pharmacy preference. Use up to four times daily as directed to monitor blood glucose. Dx code E11.65 11/05/16   Milagros Loll, MD  doxycycline (VIBRAMYCIN) 100 MG capsule Take 1 capsule (100 mg total) by mouth 2 (two) times daily. 11/06/16   Rice, Resa Miner, MD  glucose blood (TRUE METRIX BLOOD GLUCOSE TEST) test strip Use up to four times daily as directed to monitor blood glucose. Dx code E11.65 11/05/16   Milagros Loll, MD  ibuprofen (ADVIL,MOTRIN) 800 MG tablet Take 1 tablet (800 mg total) by mouth every 8 (eight) hours as needed for moderate pain. 11/05/16   Milagros Loll, MD  insulin detemir (LEVEMIR) 100 UNIT/ML injection Inject 0.32 mLs (32 Units total) into the skin at bedtime. 11/06/16   Collier Salina, MD  Insulin Syringe-Needle U-100 31G X 15/64" 0.5 ML MISC Use to administer insulin once a day. The patient is insulin requiring, ICD 10 code E11.65. 11/05/16   Milagros Loll, MD  metFORMIN (GLUCOPHAGE-XR) 500 MG 24 hr tablet 2 daily with breakfast 11/13/16   Argentina Donovan, PA-C  TRUEPLUS LANCETS 28G MISC Use up to four times daily as directed to monitor blood glucose. Dx code E11.65 11/05/16   Milagros Loll, MD     Objective:  EXAM:   Vitals:   11/13/16 1420  BP: 112/74  Pulse: 88  Resp: 16  Temp: 98.2 F (36.8 C)  TempSrc: Oral  SpO2: 96%  Weight: 184 lb (83.5 kg)    General appearance : A&OX3. NAD. Non-toxic-appearing HEENT: Atraumatic and Normocephalic.  PERRLA. EOM intact.   Neck: supple, no JVD. No cervical lymphadenopathy. No thyromegaly Chest/Lungs:  Breathing-non-labored, Good air entry bilaterally, breath sounds normal without rales, rhonchi, or wheezing  CVS: S1 S2 regular, no murmurs, gallops, rubs  Extremities: Bilateral Lower Ext shows no edema, both legs are warm to touch with = pulse throughout.  L foot-there is erythema, swelling, and eschar bt the 2nd and 3rd digit.  There is no  induration of fluctuance.  There is no necrosis.  N-V she is intact.  The area is painful.  It does appear mildly improved  Neurology:  CN II-XII grossly intact, Non focal.   Psych:  TP linear. J/I WNL. Normal speech. Appropriate eye contact and affect.  Skin:  No Rash  Data Review Lab Results  Component Value Date   HGBA1C 11.4 (H) 11/04/2016   HGBA1C 10.7 (H) 01/14/2016     Assessment & Plan   1. Diabetes mellitus type 2 in obese (HCC) Uncontrolled.  - Glucose (CBG) - insulin aspart (novoLOG) injection 10 Units; Inject 0.1 mLs (10 Units total) into the skin once. - POCT glucose (manual entry) Increase dose of metformin to 1036m daily.  She is on XR.  Consider increasing dose at f/up.  Continue current insulin regimen Check blood sugars before each meal and at bedtime and record and bring to next visit.  2. Pain of toe of left foot due to cellulitis/myositis/complicated by uncontrolled diabetes.   Improving but slowly and still painful. Finish Doxy.  To ED if worsens  again.  - acetaminophen-codeine (TYLENOL #3) 300-30 MG tablet; Take 1 tablet by mouth every 4 (four) hours as needed for moderate pain.  Dispense: 20 tablet; Refill: 0.  Use sparingly.  Applied telfa dressing and ACE wrap.  I have encouraged her to stay off her foot and elevate it as much as possible.  If she can get orange card in place, wound care would be helpful if possible.     Patient have been counseled extensively about nutrition and exercise  Return in about 2 weeks (around 11/27/2016) for Dr Lucilla Edin and DM f/up.  The patient was given clear instructions to go to ER or return to medical center if symptoms don't improve, worsen or new problems develop. The patient verbalized understanding. The patient was told to call to get lab results if they haven't heard anything in the next week.     Freeman Caldron, PA-C [facility] [place], [place]   11/13/2016, 5:00  PM

## 2016-11-16 ENCOUNTER — Ambulatory Visit: Payer: Self-pay

## 2016-11-19 ENCOUNTER — Inpatient Hospital Stay: Payer: Self-pay | Admitting: Internal Medicine

## 2016-11-30 ENCOUNTER — Ambulatory Visit: Payer: Self-pay | Admitting: Family Medicine

## 2017-02-13 ENCOUNTER — Emergency Department (HOSPITAL_COMMUNITY)
Admission: EM | Admit: 2017-02-13 | Discharge: 2017-02-13 | Disposition: A | Discharge status: Home / Self Care | Payer: Self-pay | Attending: Emergency Medicine | Admitting: Emergency Medicine

## 2017-02-13 ENCOUNTER — Encounter (HOSPITAL_COMMUNITY): Payer: Self-pay | Admitting: Emergency Medicine

## 2017-02-13 DIAGNOSIS — M436 Torticollis: Secondary | ICD-10-CM | POA: Insufficient documentation

## 2017-02-13 DIAGNOSIS — Z794 Long term (current) use of insulin: Secondary | ICD-10-CM | POA: Insufficient documentation

## 2017-02-13 DIAGNOSIS — F17210 Nicotine dependence, cigarettes, uncomplicated: Secondary | ICD-10-CM | POA: Insufficient documentation

## 2017-02-13 DIAGNOSIS — Z791 Long term (current) use of non-steroidal anti-inflammatories (NSAID): Secondary | ICD-10-CM | POA: Insufficient documentation

## 2017-02-13 DIAGNOSIS — Z79899 Other long term (current) drug therapy: Secondary | ICD-10-CM | POA: Insufficient documentation

## 2017-02-13 DIAGNOSIS — M62838 Other muscle spasm: Secondary | ICD-10-CM | POA: Insufficient documentation

## 2017-02-13 DIAGNOSIS — E119 Type 2 diabetes mellitus without complications: Secondary | ICD-10-CM | POA: Insufficient documentation

## 2017-02-13 MED ORDER — HYDROCODONE-ACETAMINOPHEN 5-325 MG PO TABS
1.0000 | ORAL_TABLET | Freq: Once | ORAL | Status: AC
  Administered 2017-02-13: 1 via ORAL
  Filled 2017-02-13: qty 1

## 2017-02-13 MED ORDER — METHOCARBAMOL 500 MG PO TABS: 500.0000 mg | ORAL_TABLET | Freq: Three times a day (TID) | ORAL | 0 refills | Status: DC | PRN

## 2017-02-13 MED ORDER — NAPROXEN 500 MG PO TABS: 500.0000 mg | ORAL_TABLET | Freq: Two times a day (BID) | ORAL | 0 refills | Status: DC

## 2017-02-13 NOTE — ED Notes (Signed)
Another warm blanket placed around neck.

## 2017-02-13 NOTE — ED Triage Notes (Signed)
Pt states she woke up yesterday with pain in the right side of her neck that is worse with any movement. Pt denies any type of injury. Pt denies any numbness or tingling in arms.

## 2017-02-13 NOTE — ED Notes (Addendum)
Pt c/o neck pain since yesterday. Pt is tearful, stating "it hurts when I swallow, when I turn my head, when I sneeze..". Pt carrying a very heavy book bag which she asked me to carry to the tx room.

## 2017-02-13 NOTE — ED Notes (Signed)
Pt up to bathroom.

## 2017-02-13 NOTE — ED Provider Notes (Signed)
[facility]-EMERGENCY DEPT Provider Note   CSN: [id number] Arrival date & time: 02/13/17  9480     History   Chief Complaint Chief Complaint  Patient presents with  . Neck Pain    HPI Bianca Yang is a 41 y.o. female presenting with sudden onset right-sided neck pain radiating down her shoulder blade upon awakening yesterday morning. She has tried ibuprofen without relief. No injury or trauma. No numbness or weakness in the upper extremities. Patient has never experienced anything like this before. No fever, chills, headache or other symptoms.  HPI  Past Medical History:  Diagnosis Date  . Diabetes mellitus without complication [facility])     Patient Active Problem List   Diagnosis Date Noted  . Cellulitis 11/05/2016  . 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 Intractable vomiting   . Diabetes mellitus type 2 in obese ([facility]CC) 01/14/2016  . Hypoxia 01/14/2016  . Tubo-ovarian abscess 01/13/2016  . CONTACT DERMATITIS 12/04/2008    Past Surgical History:  Procedure Laterality Date  . TUBAL LIGATION      OB History    No data available       Home Medications    Prior to Admission medications   Medication Sig Start Date End Date Taking? Authorizing Provider  ibuprofen (ADVIL,MOTRIN) 800 MG tablet Take 1 tablet (800 mg total) by mouth every 8 (eight) hours as needed for moderate pain. Patient taking differently: Take 200-400 mg by mouth every 8 (eight) hours as needed for moderate pain.  11/05/16  Yes Lora Paula, MD  acetaminophen-codeine (TYLENOL #3) 300-30 MG tablet Take 1 tablet by mouth every 4 (four) hours as needed for moderate pain. Patient not taking: Reported on 02/13/2017 11/13/16   Anders Simmonds, PA-C  insulin detemir (LEVEMIR) 100 UNIT/ML injection Inject 0.32 mLs (32 Units total) into the skin at bedtime. Patient not taking: Reported on 02/13/2017 11/06/16   Fuller Plan, MD  metFORMIN (GLUCOPHAGE-XR) 500 MG 24 hr tablet 2 daily with  breakfast Patient not taking: Reported on 02/13/2017 11/13/16   Anders Simmonds, PA-C  methocarbamol (ROBAXIN) 500 MG tablet Take 1 tablet (500 mg total) by mouth every 8 (eight) hours as needed for muscle spasms. 02/13/17   Mathews Robinsons B, PA-C  naproxen (NAPROSYN) 500 MG tablet Take 1 tablet (500 mg total) by mouth 2 (two) times daily with a meal. 02/13/17   Georgiana Shore, PA-C    Family History Family History  Problem Relation Age of Onset  . Diabetes Mother     Social History Social History  Substance Use Topics  . Smoking status: Current Every Day Smoker    Packs/day: 0.50    Types: Cigarettes  . Smokeless tobacco: Never Used  . Alcohol use Yes     Comment: socially     Allergies   Sulfa antibiotics   Review of Systems Review of Systems  Constitutional: Negative for chills and fever.  HENT: Negative for facial swelling and trouble swallowing.   Respiratory: Negative for cough, shortness of breath and stridor.   Cardiovascular: Negative for chest pain and palpitations.  Gastrointestinal: Negative for abdominal pain, nausea and vomiting.  Musculoskeletal: Positive for myalgias, neck pain and neck stiffness. Negative for arthralgias, back pain, gait problem and joint swelling.  Skin: Negative for color change, pallor, rash and wound.  Neurological: Negative for dizziness, seizures, syncope, facial asymmetry, weakness, numbness and headaches.     Physical Exam Updated Vital Signs BP (!) 132/55 (BP Location: Right Arm)  Pulse 75   Temp 98 F (36.7 C)   Resp 16   SpO2 95%   Physical Exam  Constitutional: She appears well-developed and well-nourished. No distress.  Patient is afebrile, nontoxic-appearing, lying in chair in obvious discomfort.  HENT:  Head: Normocephalic and atraumatic.  Eyes: Conjunctivae and EOM are normal.  Neck: Normal range of motion. Neck supple.  Cardiovascular: Normal rate, regular rhythm, normal heart sounds and intact distal  pulses.   No murmur heard. Pulmonary/Chest: Effort normal and breath sounds normal. No stridor. No respiratory distress. She has no wheezes. She has no rales.  Abdominal: She exhibits no distension.  Musculoskeletal: Normal range of motion. She exhibits tenderness. She exhibits no edema or deformity.  Patient has tenderness to palpation of the right trapezius muscle. No midline tenderness palpation of the spine.  Neurological: She is alert. No sensory deficit. She exhibits normal muscle tone.  5 out of 5 strength in grips, shoulder abduction and adduction, elbow flexion and extension bilaterally. Neurovascularly intact distally  Skin: Skin is warm and dry. No rash noted. She is not diaphoretic. No erythema. No pallor.  Psychiatric: She has a normal mood and affect.  Nursing note and vitals reviewed.    ED Treatments / Results  Labs (all labs ordered are listed, but only abnormal results are displayed) Labs Reviewed - No data to display  EKG  EKG Interpretation None       Radiology No results found.  Procedures Procedures (including critical care time)  Medications Ordered in ED Medications  HYDROcodone-acetaminophen (NORCO/VICODIN) 5-325 MG per tablet 1 tablet (1 tablet Oral Given 02/13/17 1041)     Initial Impression / Assessment and Plan / ED Course  I have reviewed the triage vital signs and the nursing notes.  Pertinent labs & imaging results that were available during my care of the patient were reviewed by me and considered in my medical decision making (see chart for details).    Patient presenting with cervical strain. Tenderness and spasm of the right trapezius muscle.  Otherwise reassuring exam, patient is afebrile, nontoxic, no focal deficits,equal strength bilaterally.  Pain was managed well in the emergency department. Patient reported improvement. On reassessment, patient was sleeping comfortably.  Will discharge home with symptomatic relief and  follow up with PCP as needed.  Normal vital signs and patient was stable.  Discussed strict return precautions and advised to return to the emergency department if experiencing any new or worsening symptoms. Instructions were understood and patient agreed with discharge plan. Final Clinical Impressions(s) / ED Diagnoses   Final diagnoses:  Torticollis  Muscle spasms of neck    New Prescriptions New Prescriptions   METHOCARBAMOL (ROBAXIN) 500 MG TABLET    Take 1 tablet (500 mg total) by mouth every 8 (eight) hours as needed for muscle spasms.   NAPROXEN (NAPROSYN) 500 MG TABLET    Take 1 tablet (500 mg total) by mouth 2 (two) times daily with a meal.     Georgiana Shore, PA-C 02/13/17 1205    Raeford Razor, MD 02/14/17 1147

## 2017-02-13 NOTE — ED Notes (Signed)
Pt appears to be sleeping but said pain was "a 10".

## 2017-02-13 NOTE — Discharge Instructions (Signed)
As discussed, apply heat to the area and take naproxen twice a day and muscle relaxant at night time. Follow-up to primary care provider if symptoms persist.  Read the instructions provided on neck exercises and torticollis.  Return if you experience weakness or loss of sensation in the upper extremities, fever, chills, severe headache or other concerning symptoms in the meantime.

## 2017-02-24 ENCOUNTER — Emergency Department (HOSPITAL_COMMUNITY)
Admission: EM | Admit: 2017-02-24 | Discharge: 2017-02-24 | Disposition: A | Discharge status: Home / Self Care | Payer: Self-pay | Attending: Emergency Medicine | Admitting: Emergency Medicine

## 2017-02-24 ENCOUNTER — Emergency Department (HOSPITAL_COMMUNITY): Payer: Self-pay

## 2017-02-24 ENCOUNTER — Encounter (HOSPITAL_COMMUNITY): Payer: Self-pay | Admitting: *Deleted

## 2017-02-24 DIAGNOSIS — K529 Noninfective gastroenteritis and colitis, unspecified: Secondary | ICD-10-CM | POA: Insufficient documentation

## 2017-02-24 DIAGNOSIS — E119 Type 2 diabetes mellitus without complications: Secondary | ICD-10-CM | POA: Insufficient documentation

## 2017-02-24 LAB — COMPREHENSIVE METABOLIC PANEL
ALK PHOS: 104 U/L (ref 38–126)
ALT: 14 U/L (ref 14–54)
ANION GAP: 9 (ref 5–15)
AST: 16 U/L (ref 15–41)
Albumin: 3.7 g/dL (ref 3.5–5.0)
BILIRUBIN TOTAL: 0.7 mg/dL (ref 0.3–1.2)
BUN: 8 mg/dL (ref 6–20)
CALCIUM: 9.4 mg/dL (ref 8.9–10.3)
CO2: 27 mmol/L (ref 22–32)
Chloride: 97 mmol/L — ABNORMAL LOW (ref 101–111)
Creatinine, Ser: 0.5 mg/dL (ref 0.44–1.00)
GFR calc Af Amer: >60 mL/min (ref >60)
GFR calc non Af Amer: >60 mL/min (ref >60)
Glucose, Bld: 323 mg/dL — ABNORMAL HIGH (ref 65–99)
Potassium: 3.6 mmol/L (ref 3.5–5.1)
SODIUM: 133 mmol/L — AB (ref 135–145)
TOTAL PROTEIN: 7.5 g/dL (ref 6.5–8.1)

## 2017-02-24 LAB — CBC
HCT: 43 % (ref 36.0–46.0)
HEMOGLOBIN: 14.2 g/dL (ref 12.0–15.0)
MCH: 29.3 pg (ref 26.0–34.0)
MCHC: 33 g/dL (ref 30.0–36.0)
MCV: 88.8 fL (ref 78.0–100.0)
Platelets: 253 10*3/uL (ref 150–400)
RBC: 4.84 MIL/uL (ref 3.87–5.11)
RDW: 12.6 % (ref 11.5–15.5)
WBC: 17.1 10*3/uL — ABNORMAL HIGH (ref 4.0–10.5)

## 2017-02-24 LAB — I-STAT BETA HCG BLOOD, ED (MC, WL, AP ONLY)

## 2017-02-24 LAB — URINALYSIS, ROUTINE W REFLEX MICROSCOPIC
BILIRUBIN URINE: NEGATIVE
Glucose, UA: >=500 mg/dL — AB
HGB URINE DIPSTICK: NEGATIVE
KETONES UR: 5 mg/dL — AB
LEUKOCYTES UA: NEGATIVE
NITRITE: NEGATIVE
Protein, ur: 100 mg/dL — AB
Specific Gravity, Urine: 1.038 — ABNORMAL HIGH (ref 1.005–1.030)
pH: 6 (ref 5.0–8.0)

## 2017-02-24 LAB — LIPASE, BLOOD: Lipase: 29 U/L (ref 11–51)

## 2017-02-24 MED ORDER — IOPAMIDOL (ISOVUE-300) INJECTION 61%
INTRAVENOUS | Status: AC
  Administered 2017-02-24: 100 mL
  Filled 2017-02-24: qty 100

## 2017-02-24 MED ORDER — ONDANSETRON 4 MG PO TBDP: ORAL_TABLET | ORAL | 0 refills | Status: DC

## 2017-02-24 MED ORDER — KETOROLAC TROMETHAMINE 30 MG/ML IJ SOLN
30.0000 mg | Freq: Once | INTRAMUSCULAR | Status: AC
  Administered 2017-02-24: 30 mg via INTRAVENOUS
  Filled 2017-02-24: qty 1

## 2017-02-24 MED ORDER — SODIUM CHLORIDE 0.9 % IV BOLUS (SEPSIS)
1000.0000 mL | Freq: Once | INTRAVENOUS | Status: AC
  Administered 2017-02-24: 1000 mL via INTRAVENOUS

## 2017-02-24 MED ORDER — DICYCLOMINE HCL 20 MG PO TABS: ORAL_TABLET | ORAL | 0 refills | Status: DC

## 2017-02-24 MED ORDER — ONDANSETRON 4 MG PO TBDP
ORAL_TABLET | ORAL | Status: AC
  Filled 2017-02-24: qty 1

## 2017-02-24 MED ORDER — IOPAMIDOL (ISOVUE-300) INJECTION 61%
INTRAVENOUS | Status: AC
  Filled 2017-02-24: qty 100

## 2017-02-24 MED ORDER — ONDANSETRON HCL 4 MG/2ML IJ SOLN
4.0000 mg | Freq: Once | INTRAMUSCULAR | Status: AC
  Administered 2017-02-24: 4 mg via INTRAVENOUS
  Filled 2017-02-24: qty 2

## 2017-02-24 MED ORDER — ONDANSETRON 4 MG PO TBDP
4.0000 mg | ORAL_TABLET | Freq: Once | ORAL | Status: AC | PRN
  Administered 2017-02-24: 4 mg via ORAL

## 2017-02-24 NOTE — Discharge Instructions (Signed)
Drink plenty of fluids.  Follow up with your md in two days if not improving

## 2017-02-24 NOTE — ED Provider Notes (Signed)
[facility]-EMERGENCY DEPT Provider Note   CSN: [id number] Arrival date & time: 02/24/17  1314     History   Chief Complaint Chief Complaint  Patient presents with  . Abdominal Pain  . Emesis  . Diarrhea    HPI Colletta MarylandChristy R Sheerin is a 41 y.o. female.    Patient complains of diarrhea which started last night. She also complains of some abdominal cramping   The history is provided by the patient. No language interpreter was used.  Abdominal Pain   This is a new problem. The current episode started yesterday. The problem occurs constantly. The pain is associated with an unknown factor. The pain is located in the generalized abdominal region. The quality of the pain is aching. The pain is at a severity of 4/10. The pain is moderate. Associated symptoms include diarrhea and vomiting. Pertinent negatives include frequency, hematuria and headaches.  Emesis   Associated symptoms include abdominal pain and diarrhea. Pertinent negatives include no cough and no headaches.  Diarrhea   Associated symptoms include abdominal pain and vomiting. Pertinent negatives include no headaches and no cough.    Past Medical History:  Diagnosis Date  . Diabetes mellitus without complication [facility](HCC)     Patient Active Problem List   Diagnosis Date Noted  . Cellulitis 11/05/2016  . 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 Intractable vomiting   . Diabetes mellitus type 2 in obese (HCC) 01/14/2016  . Hypoxia 01/14/2016  . Tubo-ovarian abscess 01/13/2016  . CONTACT DERMATITIS 12/04/2008    Past Surgical History:  Procedure Laterality Date  . TUBAL LIGATION      OB History    No data available       Home Medications    Prior to Admission medications   Medication Sig Start Date End Date Taking? Authorizing Provider  acetaminophen-codeine (TYLENOL #3) 300-30 MG tablet Take 1 tablet by mouth every 4 (four) hours as needed for moderate pain. Patient not taking: Reported on 02/13/2017 11/13/16    Anders SimmondsMcClung, Angela M, PA-C  dicyclomine (BENTYL) 20 MG tablet Take one every 6-8 hours for abd cramps 02/24/17   Bethann BerkshireZammit, Marria Mathison, MD  ibuprofen (ADVIL,MOTRIN) 800 MG tablet Take 1 tablet (800 mg total) by mouth every 8 (eight) hours as needed for moderate pain. Patient taking differently: Take 200-400 mg by mouth every 8 (eight) hours as needed for moderate pain.  11/05/16   Lora PaulaKrall, Jennifer T, MD  insulin detemir (LEVEMIR) 100 UNIT/ML injection Inject 0.32 mLs (32 Units total) into the skin at bedtime. Patient not taking: Reported on 02/13/2017 11/06/16   Fuller Planice, Christopher W, MD  metFORMIN (GLUCOPHAGE-XR) 500 MG 24 hr tablet 2 daily with breakfast Patient not taking: Reported on 02/13/2017 11/13/16   Anders SimmondsMcClung, Angela M, PA-C  methocarbamol (ROBAXIN) 500 MG tablet Take 1 tablet (500 mg total) by mouth every 8 (eight) hours as needed for muscle spasms. Patient not taking: Reported on 02/24/2017 02/13/17   Mathews RobinsonsMitchell, Jessica B, PA-C  naproxen (NAPROSYN) 500 MG tablet Take 1 tablet (500 mg total) by mouth 2 (two) times daily with a meal. Patient not taking: Reported on 02/24/2017 02/13/17   Mathews RobinsonsMitchell, Jessica B, PA-C  ondansetron (ZOFRAN ODT) 4 MG disintegrating tablet 4mg  ODT q4 hours prn nausea/vomit 02/24/17   Bethann BerkshireZammit, Tisha Cline, MD    Family History Family History  Problem Relation Age of Onset  . Diabetes Mother     Social History Social History  Substance Use Topics  . Smoking status: Current Every Day Smoker  Packs/day: 0.50    Types: Cigarettes  . Smokeless tobacco: Never Used  . Alcohol use Yes     Comment: socially     Allergies   Sulfa antibiotics   Review of Systems Review of Systems  Constitutional: Negative for appetite change and fatigue.  HENT: Negative for congestion, ear discharge and sinus pressure.   Eyes: Negative for discharge.  Respiratory: Negative for cough.   Cardiovascular: Negative for chest pain.  Gastrointestinal: Positive for abdominal pain, diarrhea and vomiting.    Genitourinary: Negative for frequency and hematuria.  Musculoskeletal: Negative for back pain.  Skin: Negative for rash.  Neurological: Negative for seizures and headaches.  Psychiatric/Behavioral: Negative for hallucinations.     Physical Exam Updated Vital Signs BP 103/63   Pulse 76   Temp 98.8 F (37.1 C) (Oral)   Resp 18   LMP 02/03/2017   SpO2 99%   Physical Exam  Constitutional: She is oriented to person, place, and time. She appears well-developed.  HENT:  Head: Normocephalic.  Eyes: Conjunctivae and EOM are normal. No scleral icterus.  Neck: Neck supple. No thyromegaly present.  Cardiovascular: Normal rate and regular rhythm.  Exam reveals no gallop and no friction rub.   No murmur heard. Pulmonary/Chest: No stridor. She has no wheezes. She has no rales. She exhibits no tenderness.  Abdominal: She exhibits no distension. There is tenderness. There is no rebound.  Musculoskeletal: Normal range of motion. She exhibits no edema.  Lymphadenopathy:    She has no cervical adenopathy.  Neurological: She is oriented to person, place, and time. She exhibits normal muscle tone. Coordination normal.  Skin: No rash noted. No erythema.  Psychiatric: She has a normal mood and affect. Her behavior is normal.     ED Treatments / Results  Labs (all labs ordered are listed, but only abnormal results are displayed) Labs Reviewed  COMPREHENSIVE METABOLIC PANEL - Abnormal; Notable for the following:       Result Value   Sodium 133 (*)    Chloride 97 (*)    Glucose, Bld 323 (*)    All other components within normal limits  CBC - Abnormal; Notable for the following:    WBC 17.1 (*)    All other components within normal limits  URINALYSIS, ROUTINE W REFLEX MICROSCOPIC - Abnormal; Notable for the following:    APPearance HAZY (*)    Specific Gravity, Urine 1.038 (*)    Glucose, UA >=500 (*)    Ketones, ur 5 (*)    Protein, ur 100 (*)    Bacteria, UA RARE (*)    Squamous  Epithelial / LPF 6-30 (*)    All other components within normal limits  LIPASE, BLOOD  I-STAT BETA HCG BLOOD, ED ([facility], [facility], [facility] ONLY)    EKG  EKG Interpretation None       Radiology Ct Abdomen Pelvis W Contrast  Result Date: 02/24/2017 CLINICAL DATA:  Nausea, vomiting and diarrhea since 1 a.m. this morning EXAM: CT ABDOMEN AND PELVIS WITH CONTRAST TECHNIQUE: Multidetector CT imaging of the abdomen and pelvis was performed using the standard protocol following bolus administration of intravenous contrast. CONTRAST:  ISOVUE-300 IOPAMIDOL (ISOVUE-300) INJECTION 61% COMPARISON:  01/16/2016 CT FINDINGS: Lower chest: Heart size is normal. There is no pericardial effusion. 4 mm ovoid right lower lobe subpleural nodular density is noted, obscured on prior exam due to atelectasis and effusion. Hepatobiliary: No focal liver abnormality is seen. No gallstones, gallbladder wall thickening, or biliary dilatation. Pancreas: Unremarkable.  No pancreatic ductal dilatation or surrounding inflammatory changes. Spleen: Normal in size without focal abnormality. Adrenals/Urinary Tract: Bilateral adrenal nodules are identified on the left measuring 1.7 x 1.2 cm and hypodense in appearance than on the right there is a 2.1 cm hypodense cm Add 1.6 cm hyperdense nodule. Findings are unchanged relative to 2017. Symmetric enhancement of both kidneys. Minimal cortical scarring involving the left interpolar cortex of the kidney. No nephrolithiasis. No hydroureteronephrosis. The urinary bladder is nondistended. Stomach/Bowel: Stomach is within normal limits. Appendix appears normal. No evidence of bowel wall thickening, distention, or inflammatory changes. Vascular/Lymphatic: No significant vascular findings are present. No enlarged abdominal or pelvic lymph nodes. Reproductive: Bilateral adnexal cystic masses are noted on the left measuring 4 cm in diameter and on the right, 4.2 cm in diameter and 6.8 x 4.4 x 4.8 cm. No mural  nodularity or septations. No inflammatory thickening. Findings likely represent bilateral ovarian cysts or stigmata of old pelvic inflammatory disease. Other: No abdominal wall hernia or abnormality. No abdominopelvic ascites. Musculoskeletal: No acute or significant osseous findings. IMPRESSION: 1. Bilateral simple appearing adnexal cysts likely of ovarian etiology measuring 4 cm in diameter on the left and two on the right measuring 4.2 cm in diameter and 6.8 x 4.4 x 4.8 cm. These do not have worrisome features. No acute inflammation is noted. Nonemergent ultrasound correlation may prove useful of the largest cyst on the right, given that it measures over 5 cm. This recommendation follows ACR consensus guidelines: White Paper of the ACR Incidental Findings Committee II on Adnexal Findings. J Am Coll Radiol 2013:10:675-681. 2. No acute bowel inflammation or obstruction. 3. 4 mm right lower lobe pulmonary nodule. No follow-up needed if patient is low-risk. Non-contrast chest CT can be considered in 12 months if patient is high-risk. This recommendation follows the consensus statement: Guidelines for Management of Incidental Pulmonary Nodules Detected on CT Images: From the Fleischner Society 2017; Radiology 2017; 284:228-243. 4. Stable but indeterminate bilateral adrenal nodules. Given stability over the past 1 year, these may represent benign findings. No additional follow-up is recommended. This recommendation follows ACR consensus guidelines: Management of Incidental Adrenal Masses: A White Paper of the ACR Incidental Findings Committee. J Am Coll Radiol 2017;14:1038-1044. Electronically Signed   By: Tollie Eth M.D.   On: 02/24/2017 19:14    Procedures Procedures (including critical care time)  Medications Ordered in ED Medications  iopamidol (ISOVUE-300) 61 % injection (not administered)  ondansetron (ZOFRAN-ODT) disintegrating tablet 4 mg (4 mg Oral Given 02/24/17 1328)  sodium chloride 0.9 % bolus  1,000 mL (1,000 mLs Intravenous New Bag/Given 02/24/17 1722)  sodium chloride 0.9 % bolus 1,000 mL (1,000 mLs Intravenous New Bag/Given 02/24/17 1722)  ketorolac (TORADOL) 30 MG/ML injection 30 mg (30 mg Intravenous Given 02/24/17 1722)  ondansetron (ZOFRAN) injection 4 mg (4 mg Intravenous Given 02/24/17 1722)  iopamidol (ISOVUE-300) 61 % injection (100 mLs  Contrast Given 02/24/17 1836)     Initial Impression / Assessment and Plan / ED Course  I have reviewed the triage vital signs and the nursing notes.  Pertinent labs & imaging results that were available during my care of the patient were reviewed by me and considered in my medical decision making (see chart for details).      patient with gastroenteritis. CT scan of the abdomen unremarkable.   Patient will release discharged on Zofran and Bentyl and drink plenty of fluids and follow-up with her PCP in a couple days  Final Clinical Impressions(s) /  ED Diagnoses   Final diagnoses:  Gastroenteritis    New Prescriptions New Prescriptions   DICYCLOMINE (BENTYL) 20 MG TABLET    Take one every 6-8 hours for abd cramps   ONDANSETRON (ZOFRAN ODT) 4 MG DISINTEGRATING TABLET    4mg  ODT q4 hours prn nausea/vomit     Bethann Berkshire, MD 02/24/17 2013

## 2017-02-24 NOTE — ED Triage Notes (Signed)
Pt reports having abd pain with n/v/d since 0100. No acute distress is noted at triage.

## 2017-07-04 ENCOUNTER — Emergency Department (HOSPITAL_COMMUNITY)
Admission: EM | Admit: 2017-07-04 | Discharge: 2017-07-04 | Disposition: A | Discharge status: Home / Self Care | Payer: Self-pay | Attending: Emergency Medicine | Admitting: Emergency Medicine

## 2017-07-04 ENCOUNTER — Encounter (HOSPITAL_COMMUNITY): Payer: Self-pay | Admitting: Emergency Medicine

## 2017-07-04 ENCOUNTER — Emergency Department (HOSPITAL_COMMUNITY): Payer: Self-pay

## 2017-07-04 ENCOUNTER — Other Ambulatory Visit: Payer: Self-pay

## 2017-07-04 DIAGNOSIS — Z79899 Other long term (current) drug therapy: Secondary | ICD-10-CM | POA: Insufficient documentation

## 2017-07-04 DIAGNOSIS — Z794 Long term (current) use of insulin: Secondary | ICD-10-CM | POA: Insufficient documentation

## 2017-07-04 DIAGNOSIS — Z7984 Long term (current) use of oral hypoglycemic drugs: Secondary | ICD-10-CM | POA: Insufficient documentation

## 2017-07-04 DIAGNOSIS — F17210 Nicotine dependence, cigarettes, uncomplicated: Secondary | ICD-10-CM | POA: Insufficient documentation

## 2017-07-04 DIAGNOSIS — J029 Acute pharyngitis, unspecified: Secondary | ICD-10-CM | POA: Insufficient documentation

## 2017-07-04 DIAGNOSIS — E1165 Type 2 diabetes mellitus with hyperglycemia: Secondary | ICD-10-CM | POA: Insufficient documentation

## 2017-07-04 DIAGNOSIS — R739 Hyperglycemia, unspecified: Secondary | ICD-10-CM

## 2017-07-04 DIAGNOSIS — R509 Fever, unspecified: Secondary | ICD-10-CM | POA: Insufficient documentation

## 2017-07-04 DIAGNOSIS — R6889 Other general symptoms and signs: Secondary | ICD-10-CM

## 2017-07-04 LAB — COMPREHENSIVE METABOLIC PANEL
ALT: 15 U/L (ref 14–54)
AST: 19 U/L (ref 15–41)
Albumin: 3.3 g/dL — ABNORMAL LOW (ref 3.5–5.0)
Alkaline Phosphatase: 98 U/L (ref 38–126)
Anion gap: 9 (ref 5–15)
CHLORIDE: 92 mmol/L — AB (ref 101–111)
CO2: 29 mmol/L (ref 22–32)
Calcium: 9 mg/dL (ref 8.9–10.3)
Creatinine, Ser: 0.67 mg/dL (ref 0.44–1.00)
GFR calc Af Amer: >60 mL/min (ref >60)
GLUCOSE: 506 mg/dL — AB (ref 65–99)
POTASSIUM: 3.6 mmol/L (ref 3.5–5.1)
SODIUM: 130 mmol/L — AB (ref 135–145)
Total Bilirubin: 0.7 mg/dL (ref 0.3–1.2)
Total Protein: 7.6 g/dL (ref 6.5–8.1)

## 2017-07-04 LAB — CBC WITH DIFFERENTIAL/PLATELET
BASOS ABS: 0.1 10*3/uL (ref 0.0–0.1)
BASOS PCT: 0 %
EOS ABS: 0.3 10*3/uL (ref 0.0–0.7)
Eosinophils Relative: 3 %
HCT: 42 % (ref 36.0–46.0)
Hemoglobin: 13 g/dL (ref 12.0–15.0)
Lymphocytes Relative: 20 %
Lymphs Abs: 2.3 10*3/uL (ref 0.7–4.0)
MCH: 28.3 pg (ref 26.0–34.0)
MCHC: 31 g/dL (ref 30.0–36.0)
MCV: 91.3 fL (ref 78.0–100.0)
MONO ABS: 0.5 10*3/uL (ref 0.1–1.0)
Monocytes Relative: 5 %
Neutro Abs: 8.2 10*3/uL — ABNORMAL HIGH (ref 1.7–7.7)
Neutrophils Relative %: 72 %
PLATELETS: 225 10*3/uL (ref 150–400)
RBC: 4.6 MIL/uL (ref 3.87–5.11)
RDW: 13.3 % (ref 11.5–15.5)
WBC: 11.3 10*3/uL — ABNORMAL HIGH (ref 4.0–10.5)

## 2017-07-04 LAB — I-STAT BETA HCG BLOOD, ED (MC, WL, AP ONLY): I-stat hCG, quantitative: <5 m[IU]/mL (ref <5)

## 2017-07-04 LAB — URINALYSIS, ROUTINE W REFLEX MICROSCOPIC
BILIRUBIN URINE: NEGATIVE
Glucose, UA: >=500 mg/dL — AB
HGB URINE DIPSTICK: NEGATIVE
Ketones, ur: NEGATIVE mg/dL
Leukocytes, UA: NEGATIVE
NITRITE: NEGATIVE
Protein, ur: NEGATIVE mg/dL
SPECIFIC GRAVITY, URINE: 1.033 — AB (ref 1.005–1.030)
pH: 5 (ref 5.0–8.0)

## 2017-07-04 LAB — RAPID STREP SCREEN (MED CTR MEBANE ONLY): STREPTOCOCCUS, GROUP A SCREEN (DIRECT): NEGATIVE

## 2017-07-04 LAB — CBG MONITORING, ED: GLUCOSE-CAPILLARY: 321 mg/dL — AB (ref 65–99)

## 2017-07-04 MED ORDER — METFORMIN HCL ER 500 MG PO TB24: ORAL_TABLET | ORAL | 0 refills | Status: DC

## 2017-07-04 MED ORDER — SODIUM CHLORIDE 0.9 % IV BOLUS (SEPSIS)
1000.0000 mL | Freq: Once | INTRAVENOUS | Status: AC
  Administered 2017-07-04: 1000 mL via INTRAVENOUS

## 2017-07-04 MED ORDER — INSULIN DETEMIR 100 UNIT/ML ~~LOC~~ SOLN: 32.0000 [IU] | Freq: Every day | SUBCUTANEOUS | 0 refills | Status: DC

## 2017-07-04 MED ORDER — CETIRIZINE HCL 10 MG PO TABS: 10.0000 mg | ORAL_TABLET | Freq: Every day | ORAL | 1 refills | Status: DC

## 2017-07-04 MED ORDER — IBUPROFEN 800 MG PO TABS
800.0000 mg | ORAL_TABLET | Freq: Once | ORAL | Status: AC
  Administered 2017-07-04: 800 mg via ORAL
  Filled 2017-07-04: qty 1

## 2017-07-04 MED ORDER — INSULIN ASPART 100 UNIT/ML ~~LOC~~ SOLN
10.0000 [IU] | Freq: Once | SUBCUTANEOUS | Status: AC
  Administered 2017-07-04: 10 [IU] via INTRAVENOUS
  Filled 2017-07-04: qty 1

## 2017-07-04 MED ORDER — ACETAMINOPHEN 325 MG PO TABS
650.0000 mg | ORAL_TABLET | Freq: Once | ORAL | Status: AC
  Administered 2017-07-04: 650 mg via ORAL
  Filled 2017-07-04: qty 2

## 2017-07-04 NOTE — ED Notes (Signed)
Called patient from waiting room, she was eating a candy bar.

## 2017-07-04 NOTE — ED Provider Notes (Signed)
[facility] EMERGENCY DEPARTMENT Provider Note   CSN: [id number] Arrival date & time: 07/04/17  1446     History   Chief Complaint Chief Complaint  Patient presents with  . Generalized Body Aches  . Sore Throat    HPI Bianca Yang is a 42 y.o. female.  Patient with hx of DM2 presents to the ED with a chief complaint of generalized body aches.  She states that she has been having the symptoms for the past few days.  She reports associated subjective fevers, chills, dry cough, sore throat, rhinorrhea, and ear fullness.  She denies having taken anything for her symptoms.  There are no aggravating or alleviating factors.  There are no other associated symptoms.   The history is provided by the patient. No language interpreter was used.    Past Medical History:  Diagnosis Date  . Diabetes mellitus without complication [facility])     Patient Active Problem List   Diagnosis Date Noted  . Cellulitis 11/05/2016  . 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 Intractable vomiting   . Diabetes mellitus type 2 in obese (HCC) 01/14/2016  . Hypoxia 01/14/2016  . Tubo-ovarian abscess 01/13/2016  . CONTACT DERMATITIS 12/04/2008    Past Surgical History:  Procedure Laterality Date  . TUBAL LIGATION      OB History    No data available       Home Medications    Prior to Admission medications   Medication Sig Start Date End Date Taking? Authorizing Provider  acetaminophen-codeine (TYLENOL #3) 300-30 MG tablet Take 1 tablet by mouth every 4 (four) hours as needed for moderate pain. Patient not taking: Reported on 02/13/2017 11/13/16   Anders Simmonds, PA-C  dicyclomine (BENTYL) 20 MG tablet Take one every 6-8 hours for abd cramps 02/24/17   Bethann Berkshire, MD  ibuprofen (ADVIL,MOTRIN) 800 MG tablet Take 1 tablet (800 mg total) by mouth every 8 (eight) hours as needed for moderate pain. Patient taking differently: Take 200-400 mg by mouth every 8 (eight)  hours as needed for moderate pain.  11/05/16   Lora Paula, MD  insulin detemir (LEVEMIR) 100 UNIT/ML injection Inject 0.32 mLs (32 Units total) into the skin at bedtime. Patient not taking: Reported on 02/13/2017 11/06/16   Fuller Plan, MD  metFORMIN (GLUCOPHAGE-XR) 500 MG 24 hr tablet 2 daily with breakfast Patient not taking: Reported on 02/13/2017 11/13/16   Anders Simmonds, PA-C  methocarbamol (ROBAXIN) 500 MG tablet Take 1 tablet (500 mg total) by mouth every 8 (eight) hours as needed for muscle spasms. Patient not taking: Reported on 02/24/2017 02/13/17   Mathews Robinsons B, PA-C  naproxen (NAPROSYN) 500 MG tablet Take 1 tablet (500 mg total) by mouth 2 (two) times daily with a meal. Patient not taking: Reported on 02/24/2017 02/13/17   Mathews Robinsons B, PA-C  ondansetron (ZOFRAN ODT) 4 MG disintegrating tablet 4mg  ODT q4 hours prn nausea/vomit 02/24/17   Bethann Berkshire, MD    Family History Family History  Problem Relation Age of Onset  . Diabetes Mother     Social History Social History   Tobacco Use  . Smoking status: Current Every Day Smoker    Packs/day: 0.50    Types: Cigarettes  . Smokeless tobacco: Never Used  Substance Use Topics  . Alcohol use: Yes    Comment: socially  . Drug use: No     Allergies   Sulfa antibiotics   Review of Systems Review  of Systems  All other systems reviewed and are negative.    Physical Exam Updated Vital Signs BP 109/68   Pulse 82   Temp 99 F (37.2 C) (Oral)   Resp 18   Ht 5\' 7"  (1.702 m)   Wt 80.7 kg (178 lb)   LMP 06/03/2017 (Approximate)   SpO2 97%   BMI 27.88 kg/m   Physical Exam  Constitutional: She is oriented to person, place, and time. She appears well-developed and well-nourished.  HENT:  Head: Normocephalic and atraumatic.  Eyes: Conjunctivae and EOM are normal. Pupils are equal, round, and reactive to light.  Neck: Normal range of motion. Neck supple.  Cardiovascular: Normal rate and  regular rhythm. Exam reveals no gallop and no friction rub.  No murmur heard. Pulmonary/Chest: Effort normal and breath sounds normal. No respiratory distress. She has no wheezes. She has no rales. She exhibits no tenderness.  Abdominal: Soft. Bowel sounds are normal. She exhibits no distension and no mass. There is no tenderness. There is no rebound and no guarding.  Musculoskeletal: Normal range of motion. She exhibits no edema or tenderness.  Neurological: She is alert and oriented to person, place, and time.  Skin: Skin is warm and dry.  Psychiatric: She has a normal mood and affect. Her behavior is normal. Judgment and thought content normal.  Nursing note and vitals reviewed.    ED Treatments / Results  Labs (all labs ordered are listed, but only abnormal results are displayed) Labs Reviewed  CBC WITH DIFFERENTIAL/PLATELET - Abnormal; Notable for the following components:      Result Value   WBC 11.3 (*)    Neutro Abs 8.2 (*)    All other components within normal limits  COMPREHENSIVE METABOLIC PANEL - Abnormal; Notable for the following components:   Sodium 130 (*)    Chloride 92 (*)    Glucose, Bld 506 (*)    BUN <5 (*)    Albumin 3.3 (*)    All other components within normal limits  RAPID STREP SCREEN (NOT AT [facility])  CULTURE, GROUP A STREP ([facility])  I-STAT BETA HCG BLOOD, ED ([facility], [facility], [facility] ONLY)    EKG  EKG Interpretation None       Radiology Dg Chest 2 View  Result Date: 07/04/2017 CLINICAL DATA:  Generalized body aches with sore throat and fever. EXAM: CHEST  2 VIEW COMPARISON:  07/23/2016 FINDINGS: AP and lateral films obtained. The lungs are clear without focal pneumonia, edema, pneumothorax or pleural effusion. The cardiopericardial silhouette is within normal limits for size. The visualized bony structures of the thorax are intact. IMPRESSION: No acute findings. Electronically Signed   By: Kennith Center M.D.   On: 07/04/2017 16:28    Procedures Procedures  (including critical care time)  Medications Ordered in ED Medications  sodium chloride 0.9 % bolus 1,000 mL (not administered)  insulin aspart (novoLOG) injection 10 Units (not administered)  acetaminophen (TYLENOL) tablet 650 mg (650 mg Oral Given 07/04/17 1607)     Initial Impression / Assessment and Plan / ED Course  I have reviewed the triage vital signs and the nursing notes.  Pertinent labs & imaging results that were available during my care of the patient were reviewed by me and considered in my medical decision making (see chart for details).    Patient here with generalized body aches, cough, rhinorrhea, and ear congestion.  She is also noted to be significantly hyperglycemic.  She states that she is supposed to be taking  insulin, but hasn't today.    Will give fluids, insulin, and reassess.  Flu-like symptoms with hyperglycemia.  CXR is clear.  VSS.   Glucose trending down.  URI and flu like symptoms.  Afebrile.  Outside of treatment widow for Tamiflu.  Refilled DM meds.  PCP follow-up.  Final Clinical Impressions(s) / ED Diagnoses   Final diagnoses:  Flu-like symptoms  Hyperglycemia    ED Discharge Orders        Ordered    metFORMIN (GLUCOPHAGE-XR) 500 MG 24 hr tablet     07/04/17 2308    insulin detemir (LEVEMIR) 100 UNIT/ML injection  Daily at bedtime     07/04/17 2308    cetirizine (ZYRTEC ALLERGY) 10 MG tablet  Daily     07/04/17 2308       Roxy HorsemanBrowning, Abdurrahman Petersheim, PA-C 07/04/17 2322    Rolland PorterJames, Mark, MD 07/13/17 [phone]

## 2017-07-04 NOTE — ED Triage Notes (Addendum)
Pt c/o generalized body aches, sore throat and elevated temp.  Onset 2 days ago.  Pt also c/o productive cough

## 2017-07-04 NOTE — ED Notes (Signed)
Charge notified lab called with Glucose of 506.  Comment made

## 2017-07-04 NOTE — ED Notes (Addendum)
Attempted IV, Pt rolling around on bed, screaming, and flinging her arms.

## 2017-07-07 LAB — CULTURE, GROUP A STREP (THRC)

## 2017-08-20 ENCOUNTER — Other Ambulatory Visit: Payer: Self-pay

## 2017-08-20 ENCOUNTER — Encounter (HOSPITAL_COMMUNITY): Payer: Self-pay

## 2017-08-20 ENCOUNTER — Emergency Department (HOSPITAL_COMMUNITY)
Admission: EM | Admit: 2017-08-20 | Discharge: 2017-08-20 | Disposition: A | Discharge status: Home / Self Care | Payer: Self-pay | Attending: Emergency Medicine | Admitting: Emergency Medicine

## 2017-08-20 DIAGNOSIS — Z794 Long term (current) use of insulin: Secondary | ICD-10-CM | POA: Insufficient documentation

## 2017-08-20 DIAGNOSIS — F17210 Nicotine dependence, cigarettes, uncomplicated: Secondary | ICD-10-CM | POA: Insufficient documentation

## 2017-08-20 DIAGNOSIS — Z79899 Other long term (current) drug therapy: Secondary | ICD-10-CM | POA: Insufficient documentation

## 2017-08-20 DIAGNOSIS — L02412 Cutaneous abscess of left axilla: Secondary | ICD-10-CM | POA: Insufficient documentation

## 2017-08-20 DIAGNOSIS — E119 Type 2 diabetes mellitus without complications: Secondary | ICD-10-CM | POA: Insufficient documentation

## 2017-08-20 LAB — CBG MONITORING, ED: GLUCOSE-CAPILLARY: 260 mg/dL — AB (ref 65–99)

## 2017-08-20 MED ORDER — HYDROCODONE-ACETAMINOPHEN 5-325 MG PO TABS: 1.0000 | ORAL_TABLET | Freq: Four times a day (QID) | ORAL | 0 refills | Status: DC | PRN

## 2017-08-20 MED ORDER — DIAZEPAM 5 MG PO TABS
5.0000 mg | ORAL_TABLET | Freq: Once | ORAL | Status: AC
  Administered 2017-08-20: 5 mg via ORAL
  Filled 2017-08-20: qty 1

## 2017-08-20 MED ORDER — LIDOCAINE-EPINEPHRINE (PF) 2 %-1:200000 IJ SOLN
10.0000 mL | Freq: Once | INTRAMUSCULAR | Status: AC
  Administered 2017-08-20: 10 mL via INTRADERMAL
  Filled 2017-08-20: qty 20

## 2017-08-20 MED ORDER — CLINDAMYCIN HCL 300 MG PO CAPS: 300.0000 mg | ORAL_CAPSULE | Freq: Four times a day (QID) | ORAL | 0 refills | Status: DC

## 2017-08-20 MED ORDER — IBUPROFEN 800 MG PO TABS: 800.0000 mg | ORAL_TABLET | Freq: Three times a day (TID) | ORAL | 0 refills | Status: DC | PRN

## 2017-08-20 MED ORDER — HYDROCODONE-ACETAMINOPHEN 5-325 MG PO TABS
1.0000 | ORAL_TABLET | Freq: Once | ORAL | Status: AC
  Administered 2017-08-20: 1 via ORAL
  Filled 2017-08-20: qty 1

## 2017-08-20 NOTE — Discharge Instructions (Signed)
Apply warm moist compress to affected area several times daily. Take antibiotic as prescribed for the full duration  return in 48 hrs for wound recheck and packing removal.

## 2017-08-20 NOTE — ED Triage Notes (Signed)
Pt presents to the ed with complaints of a golf ball sized boil under her left arm. Area is red, swollen and hard. Pt also endorses congestion in her chest.

## 2017-08-20 NOTE — ED Provider Notes (Addendum)
[facility] EMERGENCY DEPARTMENT Provider Note   CSN: [id number] Arrival date & time: 08/20/17  1551     History   Chief Complaint Chief Complaint  Patient presents with  . Recurrent Skin Infections    HPI Bianca Yang is a 42 y.o. female.  HPI   42 year old female with history of diabetes, skin infection, presenting for evaluation of a boil.  Patient report for the past 3-4 days she developed gradual onset of pain and swelling to her left armpit.  Pain is sharp, throbbing, worse with all movement or with palpation.  This boil is similar to prior boil that she had in the past.  Pain is 10 out of 10, nothing seems to make it better or worse.  She denies any specific treatment tried.  She denies fever but does endorse some chest congestion.  She is up-to-date with tetanus.  Past Medical History:  Diagnosis Date  . Diabetes mellitus without complication [facility])     Patient Active Problem List   Diagnosis Date Noted  . Cellulitis 11/05/2016  . 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 Intractable vomiting   . Diabetes mellitus type 2 in obese (HCC) 01/14/2016  . Hypoxia 01/14/2016  . Tubo-ovarian abscess 01/13/2016  . CONTACT DERMATITIS 12/04/2008    Past Surgical History:  Procedure Laterality Date  . TUBAL LIGATION      OB History    No data available       Home Medications    Prior to Admission medications   Medication Sig Start Date End Date Taking? Authorizing Provider  acetaminophen-codeine (TYLENOL #3) 300-30 MG tablet Take 1 tablet by mouth every 4 (four) hours as needed for moderate pain. Patient not taking: Reported on 02/13/2017 11/13/16   Anders Simmonds, PA-C  cetirizine (ZYRTEC ALLERGY) 10 MG tablet Take 1 tablet (10 mg total) by mouth daily. 07/04/17   Roxy Horseman, PA-C  dicyclomine (BENTYL) 20 MG tablet Take one every 6-8 hours for abd cramps 02/24/17   Bethann Berkshire, MD  ibuprofen (ADVIL,MOTRIN) 800 MG tablet Take 1  tablet (800 mg total) by mouth every 8 (eight) hours as needed for moderate pain. Patient taking differently: Take 200-400 mg by mouth every 8 (eight) hours as needed for moderate pain.  11/05/16   Lora Paula, MD  insulin detemir (LEVEMIR) 100 UNIT/ML injection Inject 0.32 mLs (32 Units total) into the skin at bedtime. 07/04/17   Roxy Horseman, PA-C  metFORMIN (GLUCOPHAGE-XR) 500 MG 24 hr tablet 2 daily with breakfast 07/04/17   Roxy Horseman, PA-C  methocarbamol (ROBAXIN) 500 MG tablet Take 1 tablet (500 mg total) by mouth every 8 (eight) hours as needed for muscle spasms. Patient not taking: Reported on 02/24/2017 02/13/17   Mathews Robinsons B, PA-C  naproxen (NAPROSYN) 500 MG tablet Take 1 tablet (500 mg total) by mouth 2 (two) times daily with a meal. Patient not taking: Reported on 02/24/2017 02/13/17   Mathews Robinsons B, PA-C  ondansetron (ZOFRAN ODT) 4 MG disintegrating tablet 4mg  ODT q4 hours prn nausea/vomit 02/24/17   Bethann Berkshire, MD    Family History Family History  Problem Relation Age of Onset  . Diabetes Mother     Social History Social History   Tobacco Use  . Smoking status: Current Every Day Smoker    Packs/day: 0.50    Types: Cigarettes  . Smokeless tobacco: Never Used  Substance Use Topics  . Alcohol use: Yes    Comment: socially  .  Drug use: No     Allergies   Sulfa antibiotics   Review of Systems Review of Systems  Constitutional: Negative for fever.  Cardiovascular: Negative for chest pain.     Physical Exam Updated Vital Signs BP 109/68   Pulse 80   Temp 99.1 F (37.3 C) (Oral)   Resp 17   Wt 81.6 kg (180 lb)   SpO2 98%   BMI 28.19 kg/m   Physical Exam  Constitutional: She appears well-developed and well-nourished. No distress.  Appears anxious but nontoxic  HENT:  Head: Atraumatic.  Eyes: Conjunctivae are normal.  Neck: Neck supple.  Cardiovascular: Normal rate and regular rhythm.  Pulmonary/Chest: Effort normal and  breath sounds normal.  Neurological: She is alert.  Skin: No rash noted.  Left axillary fold: A moderate size area of induration with fluctuant and surrounding skin erythema approximately 3 x 5 cm, exquisitely tender to palpation noted in the axillary fold.  Psychiatric: She has a normal mood and affect.  Nursing note and vitals reviewed.    ED Treatments / Results  Labs (all labs ordered are listed, but only abnormal results are displayed) Labs Reviewed - No data to display  EKG  EKG Interpretation None       Radiology No results found.  Procedures .Marland Kitchen.Incision and Drainage Date/Time: 08/20/2017 7:23 PM Performed by: Fayrene Helperran, Mackenzy Grumbine, PA-C Authorized by: Fayrene Helperran, Aydin Hink, PA-C   Consent:    Consent obtained:  Verbal   Consent given by:  Patient   Risks discussed:  Incomplete drainage and pain   Alternatives discussed:  Alternative treatment Location:    Type:  Abscess   Size:  3x5cm   Location:  Upper extremity   Upper extremity location: L axillary fold. Pre-procedure details:    Skin preparation:  Betadine Sedation:    Sedation type:  Anxiolysis Anesthesia (see MAR for exact dosages):    Anesthesia method:  Local infiltration   Local anesthetic:  Lidocaine 2% WITH epi Procedure type:    Complexity:  Simple Procedure details:    Incision types:  Stab incision   Incision depth:  Subcutaneous   Scalpel blade:  11   Wound management:  Probed and deloculated   Drainage:  Purulent   Drainage amount:  Copious   Wound treatment:  Wound left open   Packing materials:  1/4 in iodoform gauze   Amount 1/4" iodoform:  7 Post-procedure details:    Patient tolerance of procedure:  Tolerated with difficulty   (including critical care time)  Medications Ordered in ED Medications  lidocaine-EPINEPHrine (XYLOCAINE W/EPI) 2 %-1:200000 (PF) injection 10 mL (not administered)  diazepam (VALIUM) tablet 5 mg (5 mg Oral Given 08/20/17 1814)  HYDROcodone-acetaminophen (NORCO/VICODIN)  5-325 MG per tablet 1 tablet (1 tablet Oral Given 08/20/17 1826)     Initial Impression / Assessment and Plan / ED Course  I have reviewed the triage vital signs and the nursing notes.  Pertinent labs & imaging results that were available during my care of the patient were reviewed by me and considered in my medical decision making (see chart for details).     BP 109/68   Pulse 80   Temp 99.1 F (37.3 C) (Oral)   Resp 17   Wt 81.6 kg (180 lb)   SpO2 98%   BMI 28.19 kg/m    Final Clinical Impressions(s) / ED Diagnoses   Final diagnoses:  Abscess of left axilla    ED Discharge Orders  Ordered    ibuprofen (ADVIL,MOTRIN) 800 MG tablet  Every 8 hours PRN     08/20/17 1928    clindamycin (CLEOCIN) 300 MG capsule  4 times daily     08/20/17 1928    HYDROcodone-acetaminophen (NORCO/VICODIN) 5-325 MG tablet  Every 6 hours PRN     08/20/17 1928     6:40 PM Patient has a visible cutaneous abscess involving her left axillary fold which will benefit from incision and drainage.  7:25 PM Successful I&D of L axillary cutaneous abscess.  Pt tolerates with difficulty.  Packing placed, pt d/c home with abx and opiate pain medication.  Wound care instruction given.  Recommend return in 48 hrs for wound recheck and packing removal.  In order to decrease risk of narcotic abuse. Pt's record were checked using the [place] Controlled Substance database.     Fayrene Helper, PA-C 08/20/17 1928    Fayrene Helper, PA-C 08/20/17 1929    Tegeler, Canary Brim, MD 08/21/17 [phone]

## 2017-12-10 ENCOUNTER — Other Ambulatory Visit: Payer: Self-pay

## 2017-12-10 ENCOUNTER — Emergency Department (HOSPITAL_COMMUNITY)
Admission: EM | Admit: 2017-12-10 | Discharge: 2017-12-10 | Disposition: A | Discharge status: Home / Self Care | Payer: Self-pay | Attending: Emergency Medicine | Admitting: Emergency Medicine

## 2017-12-10 ENCOUNTER — Encounter (HOSPITAL_COMMUNITY): Payer: Self-pay | Admitting: Emergency Medicine

## 2017-12-10 DIAGNOSIS — F17210 Nicotine dependence, cigarettes, uncomplicated: Secondary | ICD-10-CM | POA: Insufficient documentation

## 2017-12-10 DIAGNOSIS — E119 Type 2 diabetes mellitus without complications: Secondary | ICD-10-CM | POA: Insufficient documentation

## 2017-12-10 DIAGNOSIS — Z79899 Other long term (current) drug therapy: Secondary | ICD-10-CM | POA: Insufficient documentation

## 2017-12-10 DIAGNOSIS — L02411 Cutaneous abscess of right axilla: Secondary | ICD-10-CM | POA: Insufficient documentation

## 2017-12-10 MED ORDER — KETOROLAC TROMETHAMINE 30 MG/ML IJ SOLN
30.0000 mg | Freq: Once | INTRAMUSCULAR | Status: AC
  Administered 2017-12-10: 30 mg via INTRAMUSCULAR
  Filled 2017-12-10: qty 1

## 2017-12-10 MED ORDER — HYDROXYZINE HCL 25 MG PO TABS: 25.0000 mg | ORAL_TABLET | Freq: Four times a day (QID) | ORAL | 0 refills | Status: DC

## 2017-12-10 MED ORDER — LIDOCAINE-EPINEPHRINE (PF) 2 %-1:200000 IJ SOLN
20.0000 mL | Freq: Once | INTRAMUSCULAR | Status: AC
  Administered 2017-12-10: 20 mL via INTRADERMAL
  Filled 2017-12-10: qty 20

## 2017-12-10 MED ORDER — HYDROCODONE-ACETAMINOPHEN 5-325 MG PO TABS: 1.0000 | ORAL_TABLET | Freq: Four times a day (QID) | ORAL | 0 refills | Status: DC | PRN

## 2017-12-10 MED ORDER — LORAZEPAM 2 MG/ML IJ SOLN: 1.0000 mg | Freq: Once | INTRAMUSCULAR | Status: DC

## 2017-12-10 MED ORDER — DOXYCYCLINE HYCLATE 100 MG PO CAPS: 100.0000 mg | ORAL_CAPSULE | Freq: Two times a day (BID) | ORAL | 0 refills | Status: DC

## 2017-12-10 MED ORDER — LORAZEPAM 2 MG/ML IJ SOLN
1.0000 mg | Freq: Once | INTRAMUSCULAR | Status: AC
  Administered 2017-12-10: 1 mg via INTRAMUSCULAR
  Filled 2017-12-10: qty 1

## 2017-12-10 MED ORDER — TRAMADOL HCL 50 MG PO TABS: 50.0000 mg | ORAL_TABLET | Freq: Four times a day (QID) | ORAL | 0 refills | Status: DC | PRN

## 2017-12-10 MED ORDER — DOXYCYCLINE HYCLATE 100 MG PO TABS
100.0000 mg | ORAL_TABLET | Freq: Once | ORAL | Status: AC
  Administered 2017-12-10: 100 mg via ORAL
  Filled 2017-12-10: qty 1

## 2017-12-10 MED FILL — DOXYCYCLINE HYCLATE 100 MG: 100 | 10 days supply | Qty: 20 | Fill #0

## 2017-12-10 MED FILL — hydrOXYzine HCL 25 MG TABS: 25 | 3 days supply | Qty: 12 | Fill #0

## 2017-12-10 NOTE — Discharge Planning (Signed)
ED CM consulted by EDP Shrosbree for medication assistance. EDCM reviewed chart and spoke with the pt about [facility] ($3 [facility], does not include refills, 7 day expiration of MATCH letter and choice of pharmacies). [facility] (unable to find pt listed in PROCARE per cardholder name inquiry) and has agreed to accept MATCH; and needing co-pay override. PROCARE information entered. MATCH letter completed and provided to pt.  EDCM updated EDP and ED RN.   EDCM also confirmed that pt does not have PCP. EDCM discussed [facility] as a resource for uninsured/homeless pts.  [facility] and will visit them in the future.

## 2017-12-10 NOTE — ED Provider Notes (Signed)
[facility] EMERGENCY DEPARTMENT Provider Note   CSN: [id number] Arrival date & time: 12/10/17  1109     History   Chief Complaint Chief Complaint  Patient presents with  . Abscess    HPI Bianca Yang is a 42 y.o. female.  HPI   Patient is a 42yo female with a history of recurrent axillary abscesses, type 2 diabetes, obesity who presents to the emergency department for evaluation of right axillary abscess.  Patient reports that she noticed two areas of swelling and erythema in the right armpit area which has gradually worsened over the past week.  She reports pain is severe, throbbing and worsened with palpation.  She has tried taking ibuprofen without significant improvement.  No drainage thus far.  She denies fevers, chills, abdominal pain, nausea/vomiting, erythema or rashes elsewhere.  Is concerned because she continues to develop abscesses for no apparent reason.  She would like to have information to general surgery for further management.  She states "I need something for pain and anxiety because this procedure really hurts."  Past Medical History:  Diagnosis Date  . Diabetes mellitus without complication [facility])     Patient Active Problem List   Diagnosis Date Noted  . Cellulitis 11/05/2016  . 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 Intractable vomiting   . Diabetes mellitus type 2 in obese (HCC) 01/14/2016  . Hypoxia 01/14/2016  . Tubo-ovarian abscess 01/13/2016  . CONTACT DERMATITIS 12/04/2008    Past Surgical History:  Procedure Laterality Date  . TUBAL LIGATION       OB History   None      Home Medications    Prior to Admission medications   Medication Sig Start Date End Date Taking? Authorizing Provider  cetirizine (ZYRTEC ALLERGY) 10 MG tablet Take 1 tablet (10 mg total) by mouth daily. 07/04/17   Roxy Horseman, PA-C  clindamycin (CLEOCIN) 300 MG capsule Take 1 capsule (300 mg total) by mouth 4 (four) times daily. X 7  days 08/20/17   Fayrene Helper, PA-C  dicyclomine (BENTYL) 20 MG tablet Take one every 6-8 hours for abd cramps 02/24/17   Bethann Berkshire, MD  HYDROcodone-acetaminophen (NORCO/VICODIN) 5-325 MG tablet Take 1 tablet by mouth every 6 (six) hours as needed for moderate pain or severe pain. 08/20/17   Fayrene Helper, PA-C  ibuprofen (ADVIL,MOTRIN) 800 MG tablet Take 1 tablet (800 mg total) by mouth every 8 (eight) hours as needed for moderate pain. 08/20/17   Fayrene Helper, PA-C  insulin detemir (LEVEMIR) 100 UNIT/ML injection Inject 0.32 mLs (32 Units total) into the skin at bedtime. 07/04/17   Roxy Horseman, PA-C  metFORMIN (GLUCOPHAGE-XR) 500 MG 24 hr tablet 2 daily with breakfast 07/04/17   Roxy Horseman, PA-C  ondansetron (ZOFRAN ODT) 4 MG disintegrating tablet 4mg  ODT q4 hours prn nausea/vomit 02/24/17   Bethann Berkshire, MD    Family History Family History  Problem Relation Age of Onset  . Diabetes Mother     Social History Social History   Tobacco Use  . Smoking status: Current Every Day Smoker    Packs/day: 0.50    Types: Cigarettes  . Smokeless tobacco: Never Used  Substance Use Topics  . Alcohol use: Yes    Comment: socially  . Drug use: No     Allergies   Sulfa antibiotics   Review of Systems Review of Systems  Constitutional: Negative for chills and fever.  Gastrointestinal: Negative for abdominal pain, nausea and vomiting.  Skin: Positive  for color change (erythematous area of swelling in right armpit). Negative for wound.     Physical Exam Updated Vital Signs BP 121/77 (BP Location: Right Arm)   Pulse 77   Temp 99 F (37.2 C) (Oral)   Resp 17   Ht 5\' 8"  (1.727 m)   Wt 84.8 kg (187 lb)   LMP 11/20/2017 (Exact Date)   SpO2 98%   BMI 28.43 kg/m   Physical Exam  Constitutional: She is oriented to person, place, and time. She appears well-developed and well-nourished. No distress.  Appears anxious, tearful.   HENT:  Head: Normocephalic and atraumatic.  Eyes:  Right eye exhibits no discharge. Left eye exhibits no discharge.  Pulmonary/Chest: Effort normal. No respiratory distress.  Musculoskeletal:  Right axilla with 3cm erythematous area of fluctuance which is exquisitely tender to the touch. Smaller (1cm) area of erythema and induration noted next to it.   Neurological: She is alert and oriented to person, place, and time. Coordination normal.  Skin: Skin is warm and dry. She is not diaphoretic.  Psychiatric: She has a normal mood and affect. Her behavior is normal.  Nursing note and vitals reviewed.    ED Treatments / Results  Labs (all labs ordered are listed, but only abnormal results are displayed) Labs Reviewed - No data to display  EKG None  Radiology No results found.  Procedures .Marland KitchenIncision and Drainage Date/Time: 12/10/2017 12:38 PM Performed by: Kellie Shropshire, PA-C Authorized by: Kellie Shropshire, PA-C   Consent:    Consent obtained:  Verbal   Consent given by:  Patient   Risks discussed:  Bleeding, incomplete drainage, infection and pain   Alternatives discussed:  No treatment Location:    Type:  Abscess   Size:  3cm   Location:  Upper extremity   Upper extremity location: right axilla. Pre-procedure details:    Skin preparation:  Betadine Anesthesia (see MAR for exact dosages):    Anesthesia method:  Local infiltration   Local anesthetic:  Lidocaine 2% WITH epi Procedure type:    Complexity:  Complex Procedure details:    Incision types:  Single straight   Incision depth:  Submucosal   Scalpel blade:  11   Wound management:  Probed and deloculated   Drainage:  Purulent   Drainage amount:  Copious   Wound treatment:  Wound left open   Packing materials:  None Post-procedure details:    Patient tolerance of procedure:  Tolerated with difficulty   (including critical care time)  Medications Ordered in ED Medications  doxycycline (VIBRA-TABS) tablet 100 mg (has no administration in time range)    ketorolac (TORADOL) 30 MG/ML injection 30 mg (30 mg Intramuscular Given 12/10/17 1203)  lidocaine-EPINEPHrine (XYLOCAINE W/EPI) 2 %-1:200000 (PF) injection 20 mL (20 mLs Intradermal Given 12/10/17 1203)  LORazepam (ATIVAN) injection 1 mg (1 mg Intramuscular Given 12/10/17 1203)     Initial Impression / Assessment and Plan / ED Course  I have reviewed the triage vital signs and the nursing notes.  Pertinent labs & imaging results that were available during my care of the patient were reviewed by me and considered in my medical decision making (see chart for details).     Patient with skin abscess amenable to incision and drainage which she tolerated with difficulty.  Abscess was not large enough to warrant packing or drain, pt has been counseled to return for wound recheck in 2 days. She has a neighboring area of erythema and induration in the right  axilla, no fluctuance and doubt abscess. Brought ultrasound machine into room to evaluate further and patient is hysterical, only wants the large abscess drained. Will d/c with antibiotic given likely developing infection nearby. Encouraged home warm soaks and flushing. Have counseled her on return precautions and have given her information to follow up with general surgery given her axillary abscesses are a recurrent problem for her.   Final Clinical Impressions(s) / ED Diagnoses   Final diagnoses:  Abscess of axilla, right    ED Discharge Orders        Ordered    doxycycline (VIBRAMYCIN) 100 MG capsule  2 times daily,   Status:  Discontinued     12/10/17 1237    HYDROcodone-acetaminophen (NORCO/VICODIN) 5-325 MG tablet  Every 6 hours PRN,   Status:  Discontinued     12/10/17 1237    doxycycline (VIBRAMYCIN) 100 MG capsule  2 times daily     12/10/17 1250    HYDROcodone-acetaminophen (NORCO/VICODIN) 5-325 MG tablet  Every 6 hours PRN     12/10/17 1250       Kellie ShropshireShrosbree, Noell Lorensen J, PA-C 12/10/17 1254    Arby BarrettePfeiffer, Marcy, MD 12/10/17  1715

## 2017-12-10 NOTE — ED Triage Notes (Signed)
Care manager  At bed sided.

## 2017-12-10 NOTE — ED Triage Notes (Signed)
PT Reports she can not pay for Anti-Bx and needs to talk to some one. PA informed of Pt request.

## 2017-12-10 NOTE — ED Triage Notes (Signed)
Attempting to DC Pt. Pt requesting meds for her nerves. PA informed of Pt request .

## 2017-12-10 NOTE — ED Triage Notes (Signed)
Patient to ED c/o abscess to R axilla - 2 different areas noted to be very swollen and red. Patient denies fevers/chills. Pt states she has been here twice for same already this year.

## 2017-12-10 NOTE — ED Notes (Signed)
Declined W/C at D/C and was escorted to lobby by RN. 

## 2017-12-10 NOTE — ED Notes (Signed)
Helped with the abcess patient is re4sting with call bell in reach

## 2017-12-10 NOTE — Discharge Instructions (Signed)
Please take antibiotic twice a day for the next 10 days.  You received your first dose in the ER today.  I have written you a prescription for pain medicine called Norco.  This medicine can make you drowsy so please do not drive or work while taking it.  You can also take ibuprofen for pain.  I have written you a work note.  I have also highlighted the information to the general surgery office below.  Please call and schedule an appointment for evaluation of your recurrent boils.  Please have your wound rechecked in 2 days.  Return to the emergency department if you have any new or concerning symptoms like worsening redness and swelling, fever.

## 2018-01-26 IMAGING — DX DG FOOT COMPLETE 3+V*L*
3 series · 3 of 3 positions shown · non-contrast
Comparison: None.

CLINICAL DATA: Pain and swelling in the left foot. No reported
injury.

EXAM:
LEFT FOOT - COMPLETE 3+ VIEW

[foot ap]
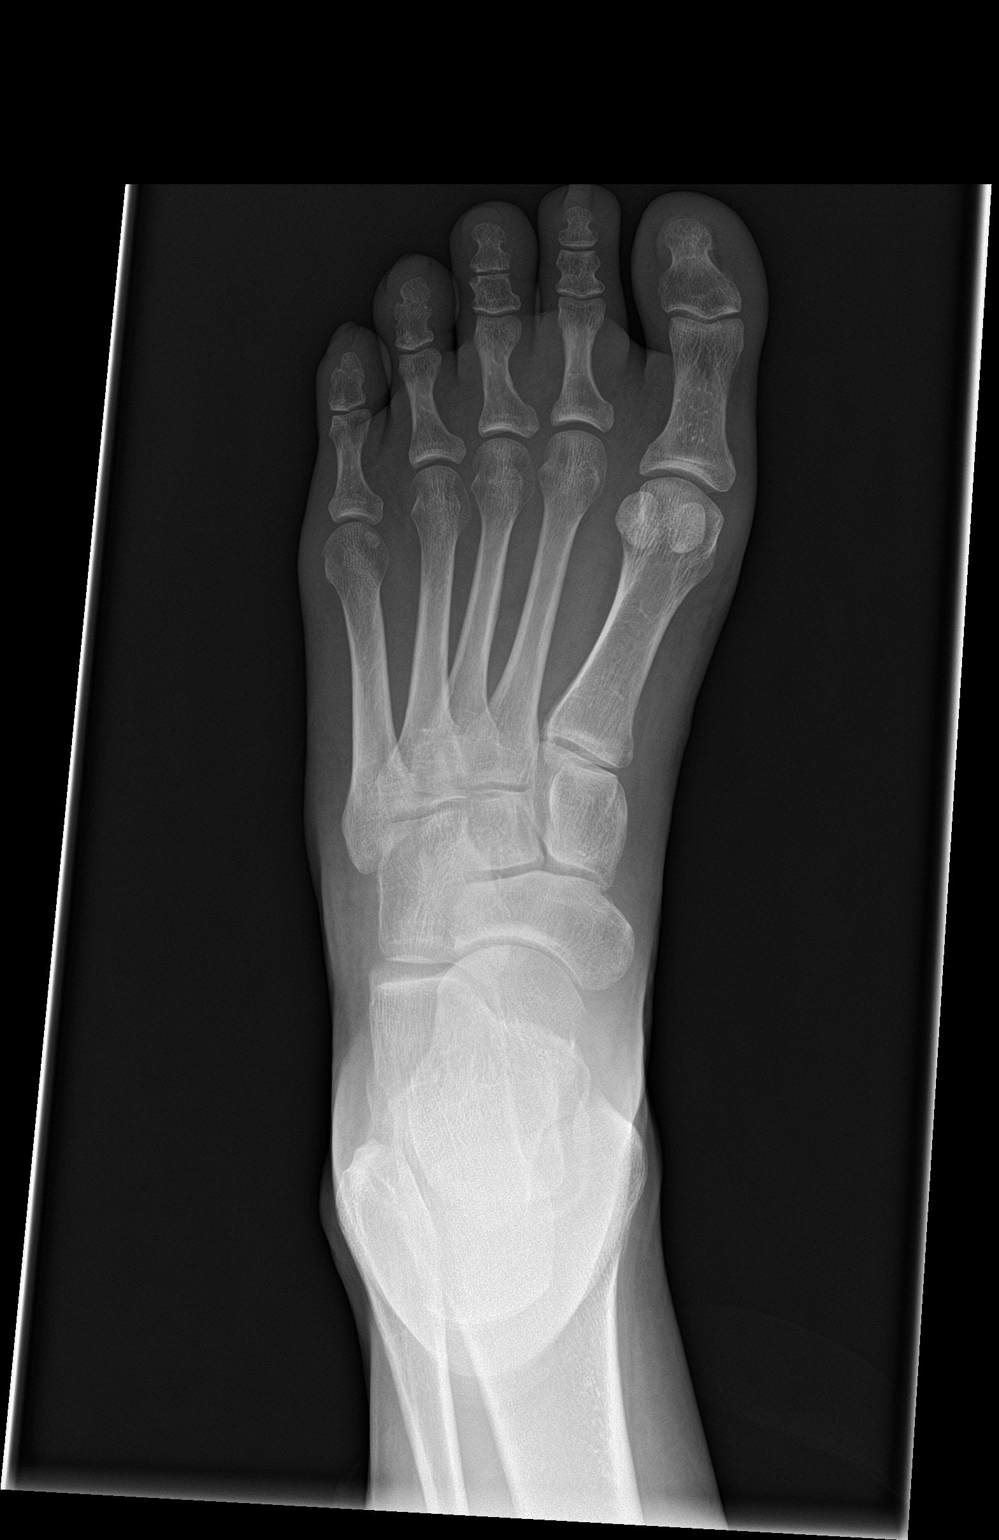

[foot obl]
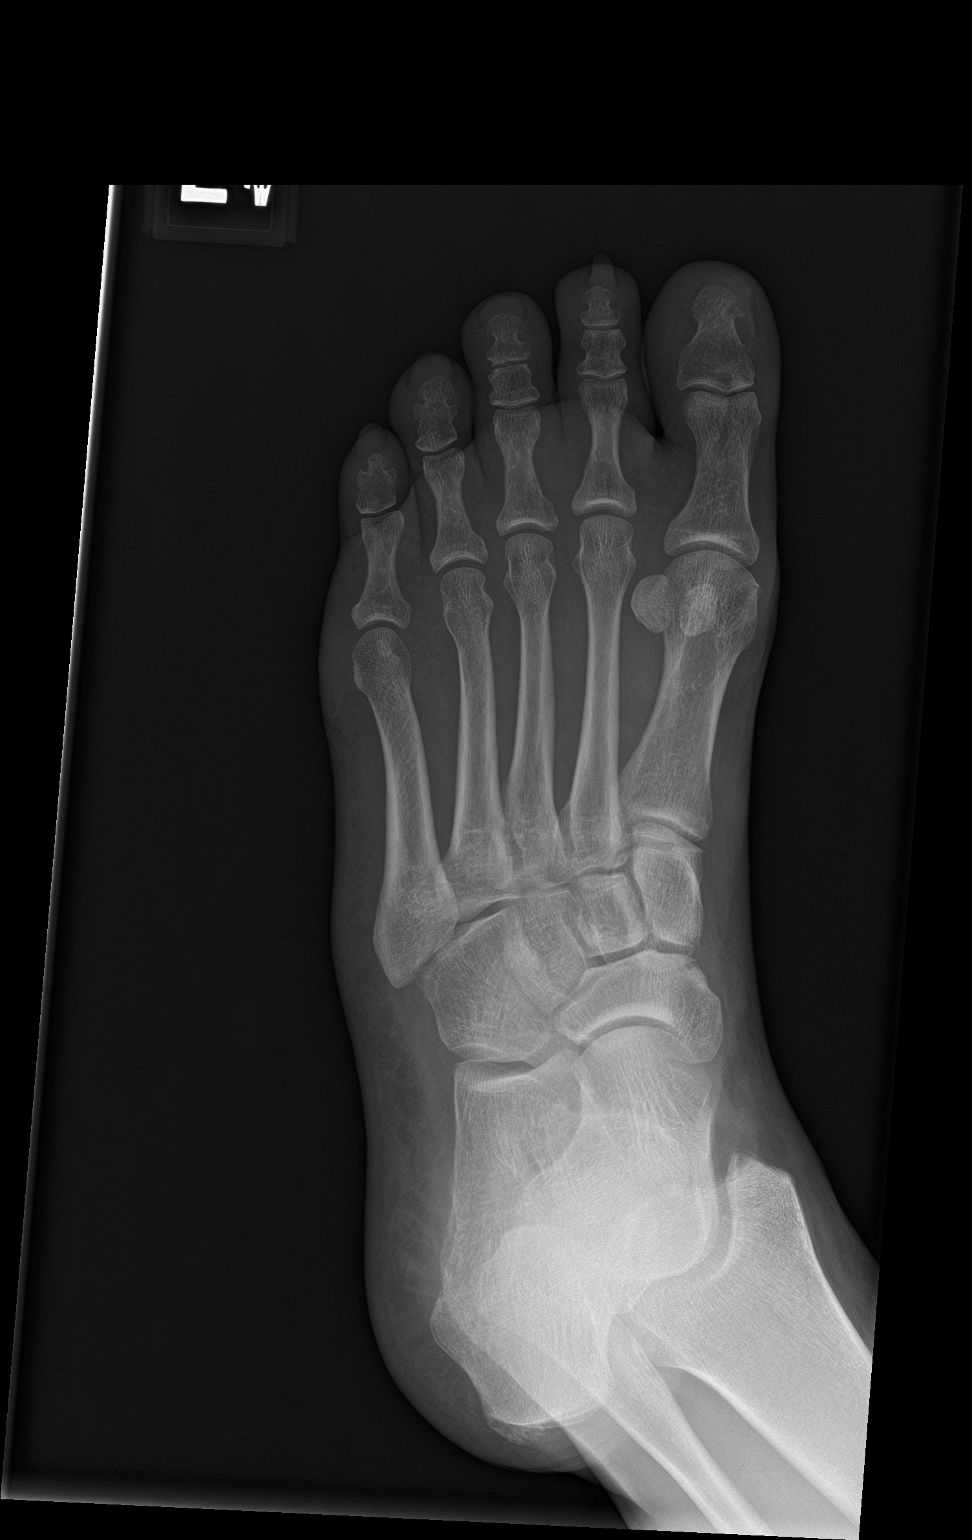

[foot lat]
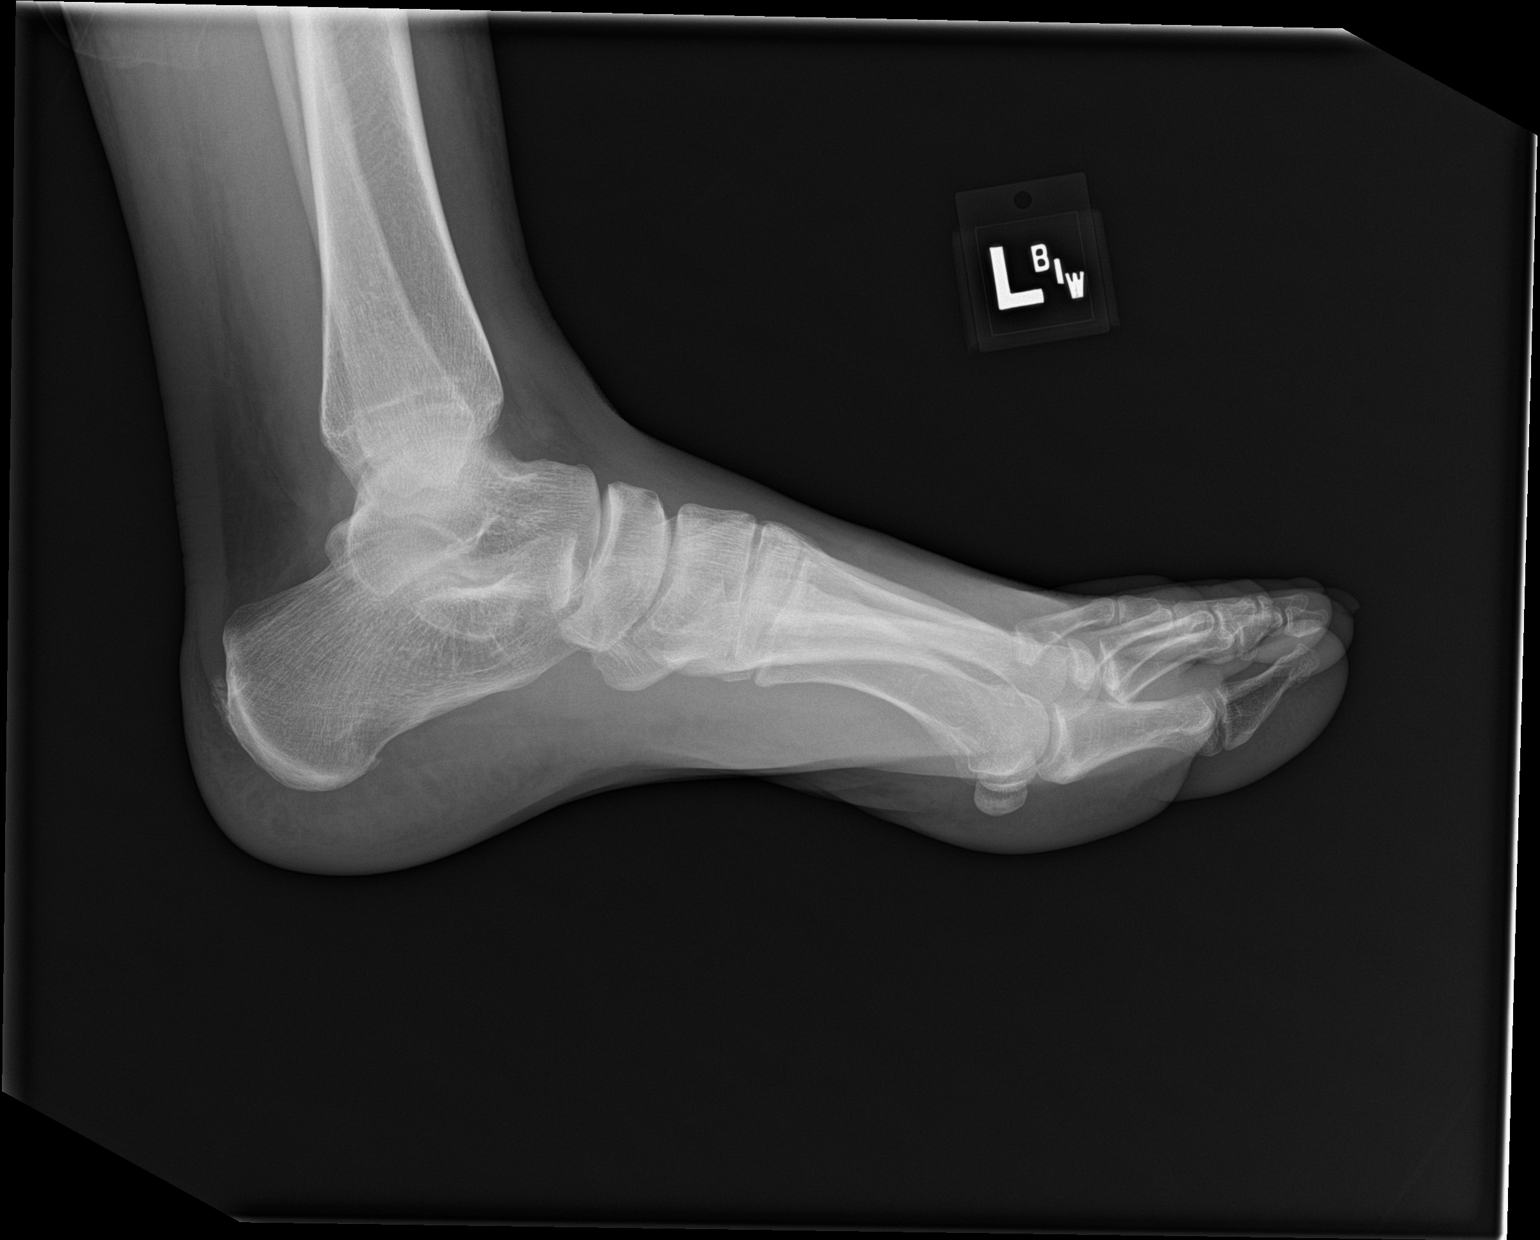

[3 of 3 positions shown; findings below may reference images not displayed]

FINDINGS: No fracture or dislocation. No suspicious focal osseous lesion. No
cortical erosions or periosteal reaction. Mild soft tissue swelling
in the dorsal distal left foot. Small Achilles and tiny plantar left
calcaneal spurs. No radiopaque foreign body.
IMPRESSION: Mild dorsal distal left foot soft tissue swelling. No acute osseous
abnormality in the left foot.

## 2018-03-28 ENCOUNTER — Encounter (HOSPITAL_COMMUNITY): Payer: Self-pay | Admitting: *Deleted

## 2018-03-28 ENCOUNTER — Emergency Department (HOSPITAL_COMMUNITY)
Admission: EM | Admit: 2018-03-28 | Discharge: 2018-03-28 | Disposition: A | Discharge status: Home / Self Care | Payer: Self-pay | Attending: Emergency Medicine | Admitting: Emergency Medicine

## 2018-03-28 DIAGNOSIS — Z794 Long term (current) use of insulin: Secondary | ICD-10-CM | POA: Insufficient documentation

## 2018-03-28 DIAGNOSIS — L02411 Cutaneous abscess of right axilla: Secondary | ICD-10-CM | POA: Insufficient documentation

## 2018-03-28 DIAGNOSIS — F17210 Nicotine dependence, cigarettes, uncomplicated: Secondary | ICD-10-CM | POA: Insufficient documentation

## 2018-03-28 DIAGNOSIS — E119 Type 2 diabetes mellitus without complications: Secondary | ICD-10-CM | POA: Insufficient documentation

## 2018-03-28 DIAGNOSIS — Z79899 Other long term (current) drug therapy: Secondary | ICD-10-CM | POA: Insufficient documentation

## 2018-03-28 MED ORDER — HYDROCODONE-ACETAMINOPHEN 5-325 MG PO TABS: 1.0000 | ORAL_TABLET | Freq: Four times a day (QID) | ORAL | 0 refills | Status: DC | PRN

## 2018-03-28 MED ORDER — HYDROCODONE-ACETAMINOPHEN 5-325 MG PO TABS
1.0000 | ORAL_TABLET | Freq: Once | ORAL | Status: AC
  Administered 2018-03-28: 1 via ORAL
  Filled 2018-03-28: qty 1

## 2018-03-28 MED ORDER — LORAZEPAM 1 MG PO TABS
1.0000 mg | ORAL_TABLET | Freq: Once | ORAL | Status: AC
  Administered 2018-03-28: 1 mg via ORAL
  Filled 2018-03-28: qty 1

## 2018-03-28 MED ORDER — DOXYCYCLINE HYCLATE 100 MG PO CAPS: 100.0000 mg | ORAL_CAPSULE | Freq: Two times a day (BID) | ORAL | 0 refills | Status: DC

## 2018-03-28 MED ORDER — IBUPROFEN 800 MG PO TABS
800.0000 mg | ORAL_TABLET | Freq: Once | ORAL | Status: AC
  Administered 2018-03-28: 800 mg via ORAL
  Filled 2018-03-28: qty 1

## 2018-03-28 MED ORDER — LIDOCAINE-EPINEPHRINE (PF) 2 %-1:200000 IJ SOLN
20.0000 mL | Freq: Once | INTRAMUSCULAR | Status: AC
  Administered 2018-03-28: 20 mL
  Filled 2018-03-28: qty 20

## 2018-03-28 NOTE — Discharge Instructions (Addendum)
Please take antibiotic as prescribed for the full duration.  Take pain medication as needed but avoid driving or operating heavy machinery as it can cause drowsiness.  Follow up with [facility] for more management of your recurrent infection.

## 2018-03-28 NOTE — ED Provider Notes (Signed)
[facility] EMERGENCY DEPARTMENT Provider Note   CSN: [id number] Arrival date & time: 03/28/18  1433     History   Chief Complaint Chief Complaint  Patient presents with  . Abscess    HPI Bianca Yang is a 42 y.o. female.  The history is provided by the patient and medical records. No language interpreter was used.  Abscess  Associated symptoms: no fever     42 year old female with history of diabetes, recurrent skin infection presenting for evaluation of abscess in her armpit.  Patient noticed a large boil that appears in her right axillary region since yesterday.  It is throbbing achy, moderate in severity, nonradiating, worsening with movement with palpation and felt similar to prior abscess.  No associated fever, chest pain or numbness.  No injury.  She is a smoker.  She is up-to-date with tetanus.  Past Medical History:  Diagnosis Date  . Diabetes mellitus without complication [facility])     Patient Active Problem List   Diagnosis Date Noted  . Cellulitis 11/05/2016  . 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 Intractable vomiting   . Diabetes mellitus type 2 in obese (HCC) 01/14/2016  . Hypoxia 01/14/2016  . Tubo-ovarian abscess 01/13/2016  . CONTACT DERMATITIS 12/04/2008    Past Surgical History:  Procedure Laterality Date  . TUBAL LIGATION       OB History   None      Home Medications    Prior to Admission medications   Medication Sig Start Date End Date Taking? Authorizing Provider  cetirizine (ZYRTEC ALLERGY) 10 MG tablet Take 1 tablet (10 mg total) by mouth daily. 07/04/17   Roxy Horseman, PA-C  clindamycin (CLEOCIN) 300 MG capsule Take 1 capsule (300 mg total) by mouth 4 (four) times daily. X 7 days 08/20/17   Fayrene Helper, PA-C  dicyclomine (BENTYL) 20 MG tablet Take one every 6-8 hours for abd cramps 02/24/17   Bethann Berkshire, MD  doxycycline (VIBRAMYCIN) 100 MG capsule Take 1 capsule (100 mg total) by mouth 2 (two) times  daily. 12/10/17   Kellie Shropshire, PA-C  HYDROcodone-acetaminophen (NORCO/VICODIN) 5-325 MG tablet Take 1 tablet by mouth every 6 (six) hours as needed for moderate pain or severe pain. 08/20/17   Fayrene Helper, PA-C  hydrOXYzine (ATARAX/VISTARIL) 25 MG tablet Take 1 tablet (25 mg total) by mouth every 6 (six) hours. 12/10/17   Kellie Shropshire, PA-C  ibuprofen (ADVIL,MOTRIN) 800 MG tablet Take 1 tablet (800 mg total) by mouth every 8 (eight) hours as needed for moderate pain. 08/20/17   Fayrene Helper, PA-C  insulin detemir (LEVEMIR) 100 UNIT/ML injection Inject 0.32 mLs (32 Units total) into the skin at bedtime. 07/04/17   Roxy Horseman, PA-C  metFORMIN (GLUCOPHAGE-XR) 500 MG 24 hr tablet 2 daily with breakfast 07/04/17   Roxy Horseman, PA-C  ondansetron (ZOFRAN ODT) 4 MG disintegrating tablet 4mg  ODT q4 hours prn nausea/vomit 02/24/17   Bethann Berkshire, MD  traMADol (ULTRAM) 50 MG tablet Take 1 tablet (50 mg total) by mouth every 6 (six) hours as needed. 12/10/17   Kellie Shropshire, PA-C    Family History Family History  Problem Relation Age of Onset  . Diabetes Mother     Social History Social History   Tobacco Use  . Smoking status: Current Every Day Smoker    Packs/day: 0.50    Types: Cigarettes  . Smokeless tobacco: Never Used  Substance Use Topics  . Alcohol use: Yes  Comment: socially  . Drug use: No     Allergies   Sulfa antibiotics   Review of Systems Review of Systems  Constitutional: Negative for fever.  Cardiovascular: Negative for chest pain.  Skin: Negative for rash.     Physical Exam Updated Vital Signs BP 124/73 (BP Location: Right Arm)   Pulse 83   Temp 98 F (36.7 C) (Oral)   Resp 20   SpO2 100%   Physical Exam  Constitutional: She appears well-developed and well-nourished. No distress.  HENT:  Head: Atraumatic.  Eyes: Conjunctivae are normal.  Neck: Neck supple.  Neurological: She is alert.  Skin: No rash noted.  Right axillary fold: A 3  cm area of induration with fluctuant and tenderness to palpation consistent with an abscess.  Psychiatric: She has a normal mood and affect.  Nursing note and vitals reviewed.    ED Treatments / Results  Labs (all labs ordered are listed, but only abnormal results are displayed) Labs Reviewed - No data to display  EKG None  Radiology No results found.  Procedures .Marland KitchenIncision and Drainage Date/Time: 03/28/2018 4:33 PM Performed by: Fayrene Helper, PA-C Authorized by: Fayrene Helper, PA-C   Consent:    Consent obtained:  Verbal   Consent given by:  Patient   Risks discussed:  Bleeding, incomplete drainage, pain and damage to other organs   Alternatives discussed:  No treatment Universal protocol:    Procedure explained and questions answered to patient or proxy's satisfaction: yes     Relevant documents present and verified: yes     Test results available and properly labeled: yes     Imaging studies available: yes     Required blood products, implants, devices, and special equipment available: yes     Site/side marked: yes     Immediately prior to procedure a time out was called: yes     Patient identity confirmed:  Verbally with patient Location:    Type:  Abscess   Location:  Upper extremity   Upper extremity location: R axillary fold. Pre-procedure details:    Skin preparation:  Betadine Anesthesia (see MAR for exact dosages):    Anesthesia method:  Local infiltration   Local anesthetic:  Lidocaine 1% WITH epi Procedure type:    Complexity:  Complex Procedure details:    Incision types:  Single straight   Incision depth:  Subcutaneous   Scalpel blade:  11   Wound management:  Probed and deloculated, irrigated with saline and extensive cleaning   Drainage:  Purulent   Drainage amount:  Moderate   Packing materials:  None Post-procedure details:    Patient tolerance of procedure:  Tolerated with difficulty   (including critical care time)  Medications Ordered in  ED Medications  HYDROcodone-acetaminophen (NORCO/VICODIN) 5-325 MG per tablet 1 tablet (has no administration in time range)  ibuprofen (ADVIL,MOTRIN) tablet 800 mg (800 mg Oral Given 03/28/18 1540)  LORazepam (ATIVAN) tablet 1 mg (1 mg Oral Given 03/28/18 1539)  lidocaine-EPINEPHrine (XYLOCAINE W/EPI) 2 %-1:200000 (PF) injection 20 mL (20 mLs Infiltration Given 03/28/18 1540)     Initial Impression / Assessment and Plan / ED Course  I have reviewed the triage vital signs and the nursing notes.  Pertinent labs & imaging results that were available during my care of the patient were reviewed by me and considered in my medical decision making (see chart for details).     BP 124/73 (BP Location: Right Arm)   Pulse 83   Temp 98 F (  36.7 C) (Oral)   Resp 20   SpO2 100%    Final Clinical Impressions(s) / ED Diagnoses   Final diagnoses:  Abscess of axilla, right    ED Discharge Orders         Ordered    HYDROcodone-acetaminophen (NORCO/VICODIN) 5-325 MG tablet  Every 6 hours PRN     03/28/18 1650    doxycycline (VIBRAMYCIN) 100 MG capsule  2 times daily     03/28/18 1650         4:34 PM Patient with history of recurrent skin abscess presenting with right axillary abscess amenable for incision and drainage.  Successful I&D procedure and patient tolerance with difficulty.  She refused packing.  Patient discharged home with doxycycline, and pain medication.  She is not pregnant.  Return precautions discussed.  Outpatient referral to [facility] surgery given. In order to decrease risk of narcotic abuse. Pt's record were checked using the [place] Controlled Substance database.     Fayrene Helper, PA-C 03/28/18 1655    Melene Plan, DO 03/31/18 [phone]

## 2018-03-28 NOTE — ED Triage Notes (Signed)
Pt in c/o abscess to her right axillary area for the last few days, history of same

## 2018-05-18 IMAGING — CT CT ABD-PELV W/ CM
2 of 6 series · 14 of 46 positions shown, 16 images · IV contrast (APPLIED)
Comparison: 01/16/2016 CT

CLINICAL DATA: Nausea, vomiting and diarrhea since 1 a.m. this
morning

EXAM:
CT ABDOMEN AND PELVIS WITH CONTRAST
TECHNIQUE: Multidetector CT imaging of the abdomen and pelvis was performed
using the standard protocol following bolus administration of
intravenous contrast.
CONTRAST:  100mL E7SIGS-VAA IOPAMIDOL (E7SIGS-VAA) INJECTION 61%

[Series 3: abdomen 5.0 · axial · 0.75mm/px · z∈[+786,+1126]mm · 11 of 82 slices shown, 13 images]
[im 7/82  soft-tissue]
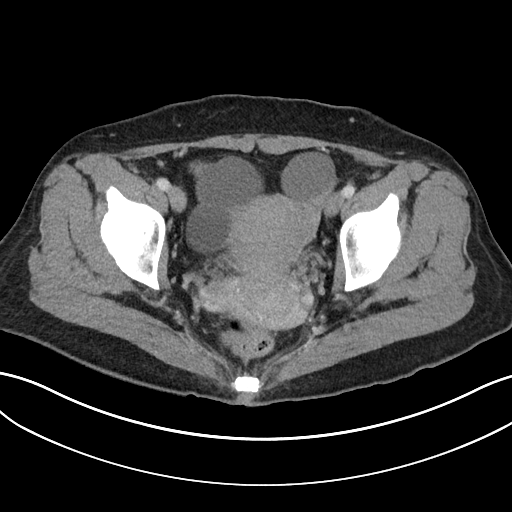
[im 7/82  bone]
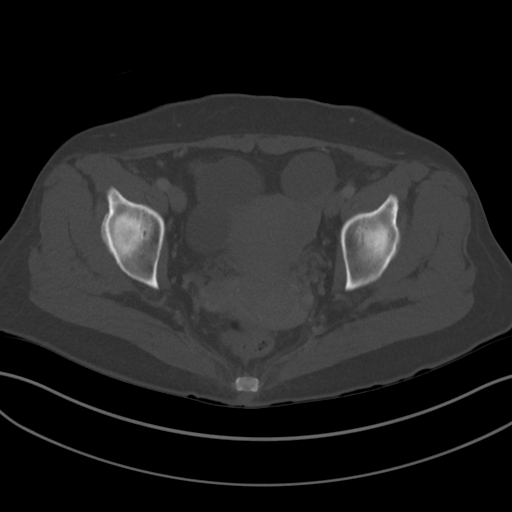
[im 14/82  soft-tissue]
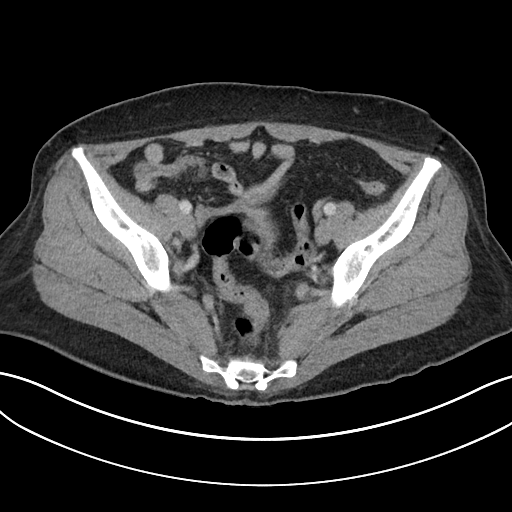
[im 21/82  soft-tissue]
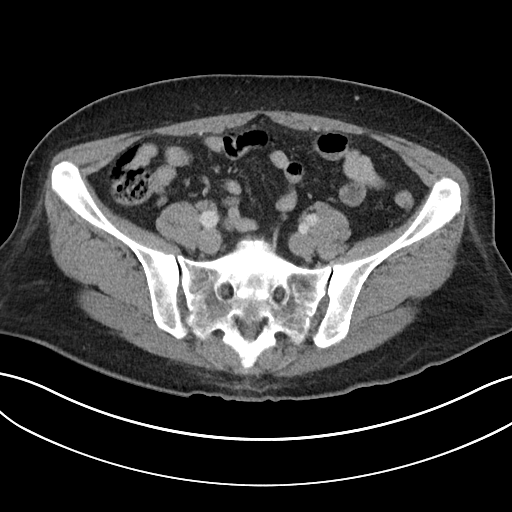
[im 28/82  soft-tissue]
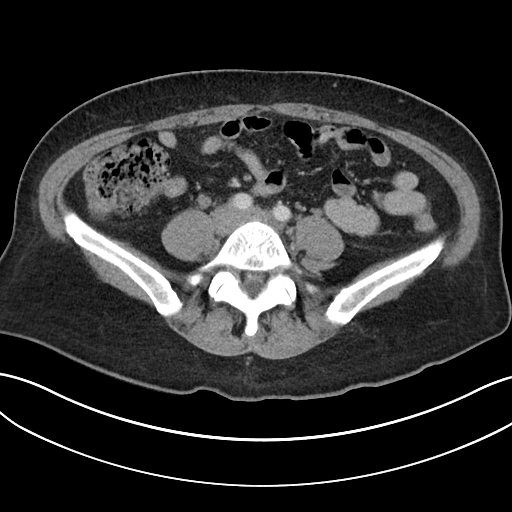
[im 34/82  soft-tissue]
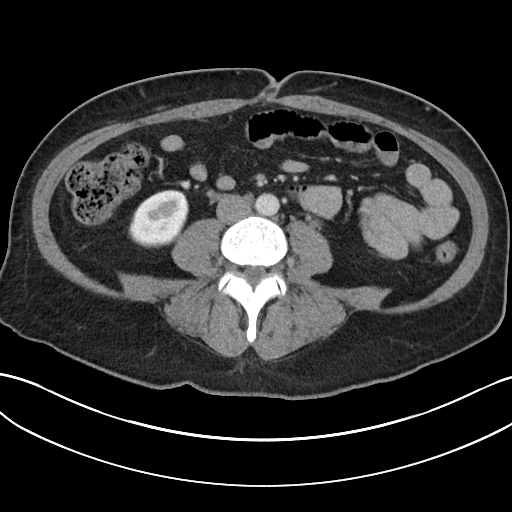
[im 41/82  soft-tissue]
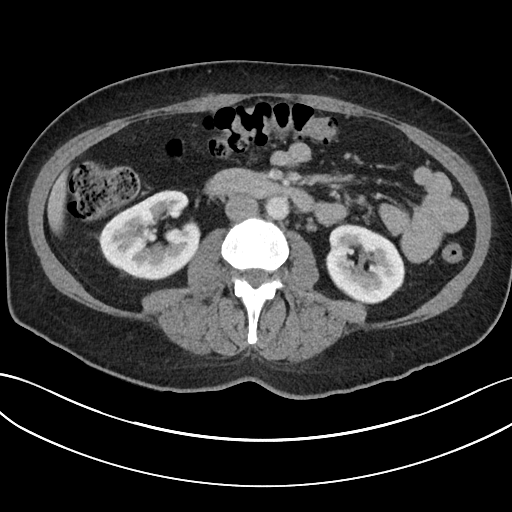
[im 48/82  soft-tissue]
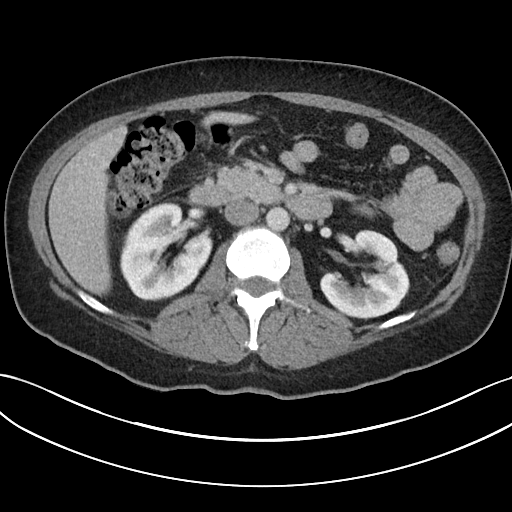
[im 55/82  soft-tissue]
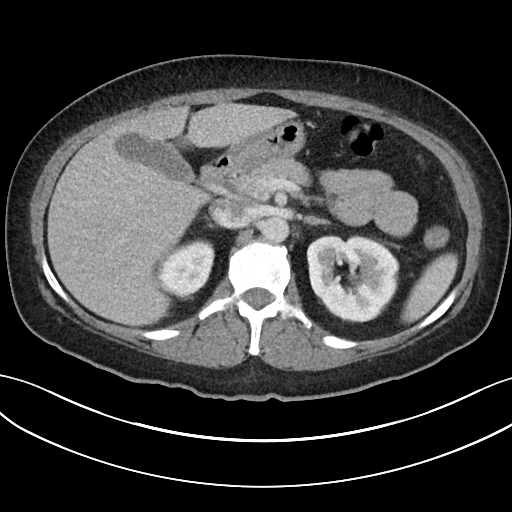
[im 61/82  soft-tissue]
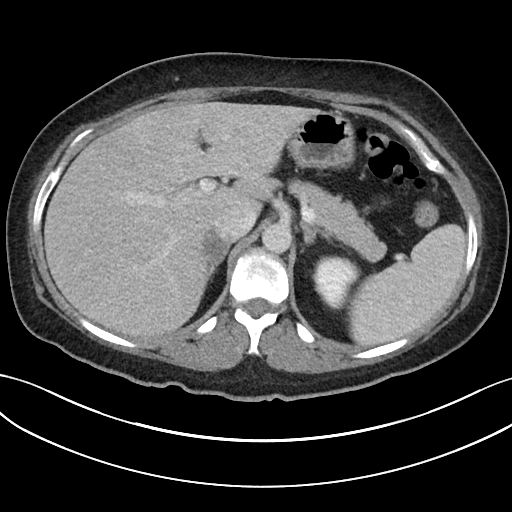
[im 61/82  bone]
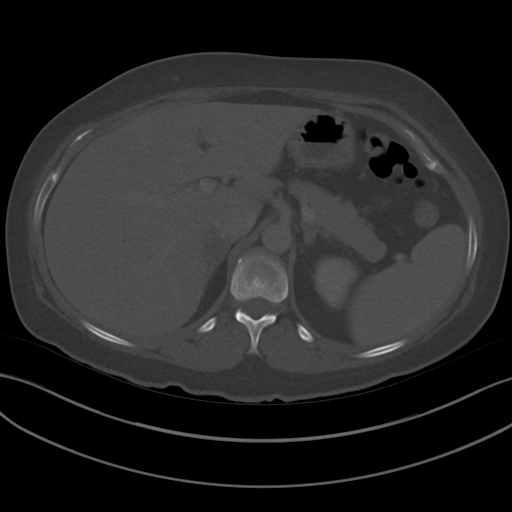
[im 68/82  soft-tissue]
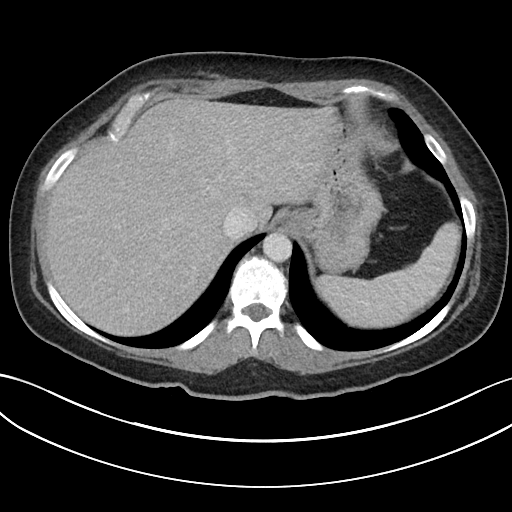
[im 75/82  soft-tissue]
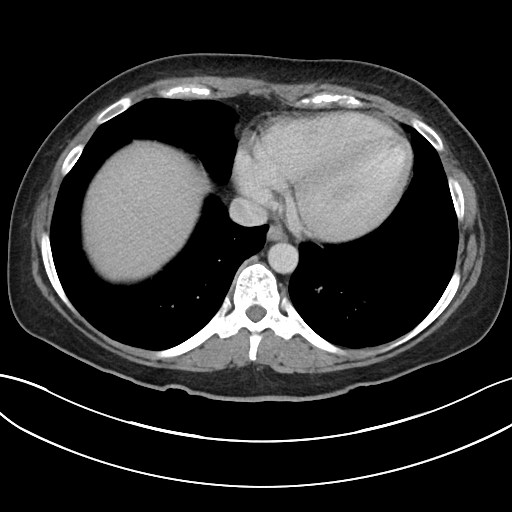

[Series 6: abdomen 3.0 mpr cor · coronal · 0.77mm/px · 3 of 93 slices shown]
[im 31/93  soft-tissue]
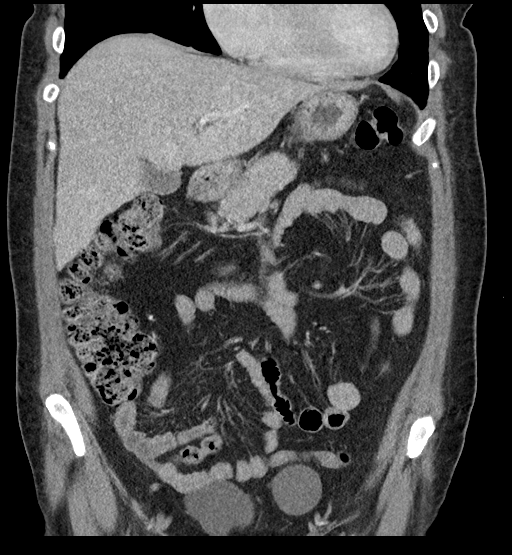
[im 41/93  soft-tissue]
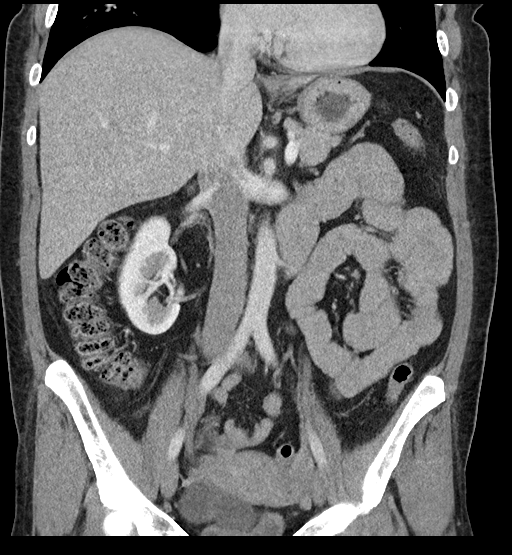
[im 52/93  soft-tissue]
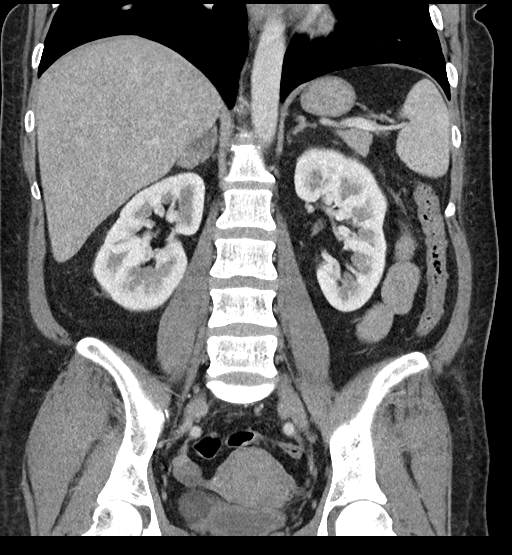

[14 of 46 positions shown; findings below may reference images not displayed]

FINDINGS: Lower chest: Heart size is normal. There is no pericardial effusion.
4 mm ovoid right lower lobe subpleural nodular density is noted,
obscured on prior exam due to atelectasis and effusion.

Hepatobiliary: No focal liver abnormality is seen. No gallstones,
gallbladder wall thickening, or biliary dilatation.

Pancreas: Unremarkable. No pancreatic ductal dilatation or
surrounding inflammatory changes.

Spleen: Normal in size without focal abnormality.

Adrenals/Urinary Tract: Bilateral adrenal nodules are identified on
the left measuring 1.7 x 1.2 cm and hypodense in appearance than on
the right there is a 2.1 cm hypodense cm Add 1.6 cm hyperdense
nodule. Findings are unchanged relative to 8355. Symmetric
enhancement of both kidneys. Minimal cortical scarring involving the
left interpolar cortex of the kidney. No nephrolithiasis. No
hydroureteronephrosis. The urinary bladder is nondistended.

Stomach/Bowel: Stomach is within normal limits. Appendix appears
normal. No evidence of bowel wall thickening, distention, or
inflammatory changes.

Vascular/Lymphatic: No significant vascular findings are present. No
enlarged abdominal or pelvic lymph nodes.

Reproductive: Bilateral adnexal cystic masses are noted on the left
measuring 4 cm in diameter and on the right, 4.2 cm in diameter and
6.8 x 4.4 x 4.8 cm. No mural nodularity or septations. No
inflammatory thickening. Findings likely represent bilateral ovarian
cysts or stigmata of old pelvic inflammatory disease.

Other: No abdominal wall hernia or abnormality. No abdominopelvic
ascites.

Musculoskeletal: No acute or significant osseous findings.
IMPRESSION: 1. Bilateral simple appearing adnexal cysts likely of ovarian
etiology measuring 4 cm in diameter on the left and two on the right
measuring 4.2 cm in diameter and 6.8 x 4.4 x 4.8 cm. These do not
have worrisome features. No acute inflammation is noted. Nonemergent
ultrasound correlation may prove useful of the largest cyst on the
right, given that it measures over 5 cm. This recommendation follows
ACR consensus guidelines: White Paper of the ACR Incidental Findings
Committee II on Adnexal Findings. [HOSPITAL] [DATE].
2. No acute bowel inflammation or obstruction.
3. 4 mm right lower lobe pulmonary nodule. No follow-up needed if
patient is low-risk. Non-contrast chest CT can be considered in 12
months if patient is high-risk. This recommendation follows the
consensus statement: Guidelines for Management of Incidental
Pulmonary Nodules Detected on CT Images: From the [HOSPITAL]
4. Stable but indeterminate bilateral adrenal nodules. Given
stability over the past 1 year, these may represent benign findings.
No additional follow-up is recommended. This recommendation follows
ACR consensus guidelines: Management of Incidental Adrenal Masses: A
White Paper of the ACR Incidental Findings Committee. [HOSPITAL] 8355;14:0701-0733.

## 2019-07-27 ENCOUNTER — Emergency Department (HOSPITAL_COMMUNITY)
Admission: EM | Admit: 2019-07-27 | Discharge: 2019-07-27 | Disposition: A | Discharge status: Home / Self Care | Payer: Self-pay | Attending: Emergency Medicine | Admitting: Emergency Medicine

## 2019-07-27 ENCOUNTER — Encounter (HOSPITAL_COMMUNITY): Payer: Self-pay | Admitting: Emergency Medicine

## 2019-07-27 DIAGNOSIS — Z79899 Other long term (current) drug therapy: Secondary | ICD-10-CM | POA: Insufficient documentation

## 2019-07-27 DIAGNOSIS — K0889 Other specified disorders of teeth and supporting structures: Secondary | ICD-10-CM

## 2019-07-27 DIAGNOSIS — F17210 Nicotine dependence, cigarettes, uncomplicated: Secondary | ICD-10-CM | POA: Insufficient documentation

## 2019-07-27 DIAGNOSIS — M25512 Pain in left shoulder: Secondary | ICD-10-CM

## 2019-07-27 DIAGNOSIS — E119 Type 2 diabetes mellitus without complications: Secondary | ICD-10-CM | POA: Insufficient documentation

## 2019-07-27 DIAGNOSIS — Z7984 Long term (current) use of oral hypoglycemic drugs: Secondary | ICD-10-CM | POA: Insufficient documentation

## 2019-07-27 DIAGNOSIS — R21 Rash and other nonspecific skin eruption: Secondary | ICD-10-CM

## 2019-07-27 MED ORDER — METHOCARBAMOL 500 MG PO TABS: 500.0000 mg | ORAL_TABLET | Freq: Two times a day (BID) | ORAL | 0 refills | Status: DC

## 2019-07-27 MED ORDER — KETOROLAC TROMETHAMINE 15 MG/ML IJ SOLN
15.0000 mg | Freq: Once | INTRAMUSCULAR | Status: AC
  Administered 2019-07-27: 15 mg via INTRAMUSCULAR
  Filled 2019-07-27: qty 1

## 2019-07-27 MED ORDER — METHOCARBAMOL 500 MG PO TABS: 500.0000 mg | ORAL_TABLET | Freq: Two times a day (BID) | ORAL | 0 refills | Status: AC

## 2019-07-27 MED ORDER — AMOXICILLIN-POT CLAVULANATE 875-125 MG PO TABS: 1.0000 | ORAL_TABLET | Freq: Two times a day (BID) | ORAL | 0 refills | Status: AC

## 2019-07-27 MED ORDER — PREDNISONE 20 MG PO TABS: 40.0000 mg | ORAL_TABLET | Freq: Every day | ORAL | 0 refills | Status: DC

## 2019-07-27 MED ORDER — AMOXICILLIN-POT CLAVULANATE 875-125 MG PO TABS: 1.0000 | ORAL_TABLET | Freq: Two times a day (BID) | ORAL | 0 refills | Status: DC

## 2019-07-27 MED ORDER — PREDNISONE 20 MG PO TABS: 40.0000 mg | ORAL_TABLET | Freq: Every day | ORAL | 0 refills | Status: AC

## 2019-07-27 MED FILL — AMOX-CLAV 875-125 MG TABLET: 875-125 | 7 days supply | Qty: 14 | Fill #0

## 2019-07-27 MED FILL — predniSONE 20 MG TABS: 20 | 5 days supply | Qty: 10 | Fill #0

## 2019-07-27 MED FILL — METHOCARBAMOL 500 MG TABS: 500 | 7 days supply | Qty: 14 | Fill #0

## 2019-07-27 NOTE — ED Triage Notes (Signed)
Pt here with a rash to her torso and neck , along with a toothache ,states that it doesn't hurt or itch just annoying

## 2019-07-27 NOTE — ED Notes (Signed)
Gave pt bus pass for d/c as well.

## 2019-07-27 NOTE — Discharge Planning (Signed)
ED CM consulted by EDP for medication assistance. EDCM reviewed chart and spoke with the pt about [facility] MATCH program ($3 co pay for each Rx through [facility] program, does not include refills, 7 day expiration of MATCH letter and choice of pharmacies). Pt is eligible for re-enrollment in [facility] MATCH program.  Pt and has agreed to accept MATCH with co-pay override. PROCARE information entered.  EDCM updated EDP and ED RN.   [facility] consulted to deliver Rx to pt at bedside prior to discharge home today.

## 2019-07-27 NOTE — ED Notes (Addendum)
Pt given all d/c meds already filled from our pharmacy and d/c instructions.

## 2019-07-27 NOTE — ED Notes (Signed)
Pt called to request for food and pain medicine, stating "my mouth is dry, I need food, I need a drink, and I need to get some pain medicine, I am hurting."

## 2019-07-27 NOTE — ED Notes (Signed)
Pt given [place] sandwich, drink, and crackers.

## 2019-07-27 NOTE — ED Provider Notes (Signed)
[place] EMERGENCY DEPARTMENT Provider Note   CSN: [id number] Arrival date & time: 07/27/19  1051     History No chief complaint on file.   Bianca Yang is a 44 y.o. female.  44 y.o female with a PMH of contact dermatitis, Hypoxia, DM, presents to the ED with multiple complaints. First complain reports a rash all over her body for the past 3 days, rash started after patient used a different blue soap to wash her body. She reports the rash is pruritic in nature. She denies any fever, associated with it. Second complaint, is right sided toothache, "for a while" smoking history and has been seen a dentist. No facial swelling or difficulty tolerating her secretions. Third complaint, is left shoulder pain, between her neck and left shoulder, worst with movement better with rest, has not tried any medications for improvement in symptoms. No vomiting, trauma, chest pain, shortness of breath or GI complaints.   The history is provided by the patient.       Past Medical History:  Diagnosis Date  . Diabetes mellitus without complication [facility])     Patient Active Problem List   Diagnosis Date Noted  . Cellulitis 11/05/2016  . Cellulitis in diabetic foot ([place]) 11/04/2016  . Intractable vomiting   . Diabetes mellitus type 2 in obese ([place]) 01/14/2016  . Hypoxia 01/14/2016  . Tubo-ovarian abscess 01/13/2016  . CONTACT DERMATITIS 12/04/2008    Past Surgical History:  Procedure Laterality Date  . TUBAL LIGATION       OB History   No obstetric history on file.     Family History  Problem Relation Age of Onset  . Diabetes Mother     Social History   Tobacco Use  . Smoking status: Current Every Day Smoker    Packs/day: 0.50    Types: Cigarettes  . Smokeless tobacco: Never Used  Substance Use Topics  . Alcohol use: Yes    Comment: socially  . Drug use: No    Home Medications Prior to Admission medications   Medication Sig Start Date End Date  Taking? Authorizing Provider  amoxicillin-clavulanate (AUGMENTIN) 875-125 MG tablet Take 1 tablet by mouth 2 (two) times daily for 7 days. 07/27/19 08/03/19  Janeece Fitting, PA-C  cetirizine (ZYRTEC ALLERGY) 10 MG tablet Take 1 tablet (10 mg total) by mouth daily. 07/04/17   Montine Circle, PA-C  clindamycin (CLEOCIN) 300 MG capsule Take 1 capsule (300 mg total) by mouth 4 (four) times daily. X 7 days 08/20/17   Domenic Moras, PA-C  dicyclomine (BENTYL) 20 MG tablet Take one every 6-8 hours for abd cramps 02/24/17   Milton Ferguson, MD  HYDROcodone-acetaminophen (NORCO/VICODIN) 5-325 MG tablet Take 1 tablet by mouth every 6 (six) hours as needed for moderate pain or severe pain. 03/28/18   Domenic Moras, PA-C  hydrOXYzine (ATARAX/VISTARIL) 25 MG tablet Take 1 tablet (25 mg total) by mouth every 6 (six) hours. 12/10/17   Glyn Ade, PA-C  ibuprofen (ADVIL,MOTRIN) 800 MG tablet Take 1 tablet (800 mg total) by mouth every 8 (eight) hours as needed for moderate pain. 08/20/17   Domenic Moras, PA-C  insulin detemir (LEVEMIR) 100 UNIT/ML injection Inject 0.32 mLs (32 Units total) into the skin at bedtime. 07/04/17   Montine Circle, PA-C  metFORMIN (GLUCOPHAGE-XR) 500 MG 24 hr tablet 2 daily with breakfast 07/04/17   Montine Circle, PA-C  methocarbamol (ROBAXIN) 500 MG tablet Take 1 tablet (500 mg total) by mouth 2 (two) times daily  for 7 days. 07/27/19 08/03/19  Claude Manges, PA-C  ondansetron (ZOFRAN ODT) 4 MG disintegrating tablet 4mg  ODT q4 hours prn nausea/vomit 02/24/17   02/26/17, MD  predniSONE (DELTASONE) 20 MG tablet Take 2 tablets (40 mg total) by mouth daily for 5 days. 07/27/19 08/01/19  09/29/19, PA-C  traMADol (ULTRAM) 50 MG tablet Take 1 tablet (50 mg total) by mouth every 6 (six) hours as needed. 12/10/17   12/12/17, PA-C    Allergies    Sulfa antibiotics  Review of Systems   Review of Systems  Constitutional: Negative for fever.  HENT: Positive for dental problem.     Musculoskeletal: Positive for arthralgias.  Skin: Positive for rash.    Physical Exam Updated Vital Signs BP 132/75   Pulse (!) 101   Temp 99.7 F (37.6 C) (Oral)   Resp 16   SpO2 90%   Physical Exam Vitals and nursing note reviewed.  Constitutional:      Appearance: Normal appearance. She is not ill-appearing.  HENT:     Head: Normocephalic and atraumatic.     Nose: Nose normal.     Mouth/Throat:     Mouth: Mucous membranes are dry.   Eyes:     Pupils: Pupils are equal, round, and reactive to light.  Cardiovascular:     Rate and Rhythm: Normal rate.  Pulmonary:     Effort: Pulmonary effort is normal.  Abdominal:     General: Abdomen is flat.     Palpations: Abdomen is soft.  Musculoskeletal:     Left shoulder: Tenderness present. No deformity, effusion, laceration, bony tenderness or crepitus. Normal range of motion. Normal strength. Normal pulse.     Cervical back: Normal range of motion and neck supple.  Skin:    General: Skin is warm and dry.     Findings: Rash present.  Neurological:     Mental Status: She is alert and oriented to person, place, and time.              ED Results / Procedures / Treatments   Labs (all labs ordered are listed, but only abnormal results are displayed) Labs Reviewed - No data to display  EKG None  Radiology No results found.  Procedures Procedures (including critical care time)  Medications Ordered in ED Medications  ketorolac (TORADOL) 15 MG/ML injection 15 mg (15 mg Intramuscular Given 07/27/19 1336)    ED Course  I have reviewed the triage vital signs and the nursing notes.  Pertinent labs & imaging results that were available during my care of the patient were reviewed by me and considered in my medical decision making (see chart for details).    MDM Rules/Calculators/A&P   Patient with multiple complaints presents to the ED with complaints of toothache, left shoulder pain, along with a rash.  No  facial swelling, fevers, difficulty tolerating her secretions, will provide her with antibiotics to treat her dental infection.  Left shoulder appears with full range of motion, strength is 5 out of 5 with extension along with flexion.  No bony tenderness, crepitus, will not be obtaining imaging at this time.  Patient provided with Robaxin on today's visit.  Rash appears benign, states is pruritic in nature, started after changes in soap, has not had any fevers, GI symptoms, red flags, lower suspicion for TTN, SJS.  Suspect likely contact dermatitis versus viral exanthem.  Will be provided with a short course of steroids to help with her symptoms.  Patient  reports she is unable to obtain medications as she does not have any money in order to obtain these.  Will consult social work in order to obtain resources for patient at this time.  1:55 PM Spoke to case management Durward Mallard, will send medications over to [facility] transition pharmacy, in order to provide this for patient free of charge.  2:48 PM patient was provided with all her medication free of charge, she is discharged in stable condition.  Return precautions discussed at length.  Portions of this note were generated with Scientist, clinical (histocompatibility and immunogenetics). Dictation errors may occur despite best attempts at proofreading.  Final Clinical Impression(s) / ED Diagnoses Final diagnoses:  Rash  Toothache  Acute pain of left shoulder    Rx / DC Orders ED Discharge Orders         Ordered    predniSONE (DELTASONE) 20 MG tablet  Daily,   Status:  Discontinued     07/27/19 1325    amoxicillin-clavulanate (AUGMENTIN) 875-125 MG tablet  2 times daily,   Status:  Discontinued     07/27/19 1325    methocarbamol (ROBAXIN) 500 MG tablet  2 times daily,   Status:  Discontinued     07/27/19 1325    amoxicillin-clavulanate (AUGMENTIN) 875-125 MG tablet  2 times daily     07/27/19 1400    methocarbamol (ROBAXIN) 500 MG tablet  2 times daily     07/27/19 1400      predniSONE (DELTASONE) 20 MG tablet  Daily     07/27/19 1400           Claude Manges, PA-C 07/27/19 1448    Eber Hong, MD 07/27/19 1714

## 2019-07-27 NOTE — Discharge Instructions (Addendum)
I have prescribed antibiotics to help with your dental infection, please take 1 tablet twice a day for the next 7 days.  I have also prescribed steroids to help with your rash, please take 2 tablets daily for the next 5 days.  Please be aware this medication can cause flushness, insomnia, appetite changes.  I have also prescribed a muscle relaxer to help with the pain in your left shoulder, be aware this medication can make you drowsy.  Please apply a heating pad or ice to the area.

## 2020-01-15 ENCOUNTER — Inpatient Hospital Stay (HOSPITAL_COMMUNITY)
Admission: EM | Admit: 2020-01-15 | Discharge: 2020-01-20 | DRG: 638 | Disposition: A | Discharge status: Home / Self Care | Payer: Self-pay | Attending: Family Medicine | Admitting: Family Medicine

## 2020-01-15 ENCOUNTER — Emergency Department (HOSPITAL_COMMUNITY): Admit: 2020-01-15 | Discharge: 2020-01-15 | Disposition: A | Discharge status: Home / Self Care | Payer: Self-pay

## 2020-01-15 ENCOUNTER — Other Ambulatory Visit: Payer: Self-pay

## 2020-01-15 ENCOUNTER — Encounter (HOSPITAL_COMMUNITY): Payer: Self-pay

## 2020-01-15 DIAGNOSIS — F17210 Nicotine dependence, cigarettes, uncomplicated: Secondary | ICD-10-CM | POA: Diagnosis present

## 2020-01-15 DIAGNOSIS — L03031 Cellulitis of right toe: Secondary | ICD-10-CM | POA: Diagnosis present

## 2020-01-15 DIAGNOSIS — A5901 Trichomonal vulvovaginitis: Secondary | ICD-10-CM | POA: Diagnosis present

## 2020-01-15 DIAGNOSIS — E118 Type 2 diabetes mellitus with unspecified complications: Secondary | ICD-10-CM

## 2020-01-15 DIAGNOSIS — Z79891 Long term (current) use of opiate analgesic: Secondary | ICD-10-CM

## 2020-01-15 DIAGNOSIS — M869 Osteomyelitis, unspecified: Secondary | ICD-10-CM | POA: Diagnosis present

## 2020-01-15 DIAGNOSIS — B9689 Other specified bacterial agents as the cause of diseases classified elsewhere: Secondary | ICD-10-CM | POA: Diagnosis present

## 2020-01-15 DIAGNOSIS — R400 Somnolence: Secondary | ICD-10-CM | POA: Diagnosis present

## 2020-01-15 DIAGNOSIS — Z882 Allergy status to sulfonamides status: Secondary | ICD-10-CM

## 2020-01-15 DIAGNOSIS — IMO0002 Reserved for concepts with insufficient information to code with codable children: Secondary | ICD-10-CM

## 2020-01-15 DIAGNOSIS — Z79899 Other long term (current) drug therapy: Secondary | ICD-10-CM

## 2020-01-15 DIAGNOSIS — R21 Rash and other nonspecific skin eruption: Secondary | ICD-10-CM | POA: Diagnosis present

## 2020-01-15 DIAGNOSIS — Z794 Long term (current) use of insulin: Secondary | ICD-10-CM

## 2020-01-15 DIAGNOSIS — E1165 Type 2 diabetes mellitus with hyperglycemia: Secondary | ICD-10-CM | POA: Diagnosis present

## 2020-01-15 DIAGNOSIS — A539 Syphilis, unspecified: Secondary | ICD-10-CM | POA: Diagnosis present

## 2020-01-15 DIAGNOSIS — E1169 Type 2 diabetes mellitus with other specified complication: Principal | ICD-10-CM | POA: Diagnosis present

## 2020-01-15 DIAGNOSIS — E11628 Type 2 diabetes mellitus with other skin complications: Secondary | ICD-10-CM | POA: Diagnosis present

## 2020-01-15 DIAGNOSIS — Z833 Family history of diabetes mellitus: Secondary | ICD-10-CM

## 2020-01-15 DIAGNOSIS — M7989 Other specified soft tissue disorders: Secondary | ICD-10-CM

## 2020-01-15 DIAGNOSIS — Z20822 Contact with and (suspected) exposure to covid-19: Secondary | ICD-10-CM | POA: Diagnosis present

## 2020-01-15 DIAGNOSIS — A5149 Other secondary syphilitic conditions: Secondary | ICD-10-CM | POA: Diagnosis present

## 2020-01-15 DIAGNOSIS — E663 Overweight: Secondary | ICD-10-CM | POA: Diagnosis present

## 2020-01-15 DIAGNOSIS — F1410 Cocaine abuse, uncomplicated: Secondary | ICD-10-CM | POA: Diagnosis present

## 2020-01-15 NOTE — ED Triage Notes (Signed)
Pt c/o pain in r foot, redness and swelling and heat x1 week. Also complains of vaginal pain, denies any unusual objects in vaginal area but has had unprotected sex. Very tearful in triage.

## 2020-01-15 NOTE — Progress Notes (Signed)
Right lower extremity venous duplex has been completed. Preliminary results can be found in CV Proc through chart review.  Results were given to Dr. Lockie Mola.  01/15/20 7:23 PM Olen Cordial RVT

## 2020-01-16 ENCOUNTER — Emergency Department (HOSPITAL_COMMUNITY): Payer: Self-pay

## 2020-01-16 DIAGNOSIS — L03115 Cellulitis of right lower limb: Secondary | ICD-10-CM

## 2020-01-16 DIAGNOSIS — F1410 Cocaine abuse, uncomplicated: Secondary | ICD-10-CM

## 2020-01-16 DIAGNOSIS — E119 Type 2 diabetes mellitus without complications: Secondary | ICD-10-CM

## 2020-01-16 DIAGNOSIS — A64 Unspecified sexually transmitted disease: Secondary | ICD-10-CM

## 2020-01-16 DIAGNOSIS — M869 Osteomyelitis, unspecified: Secondary | ICD-10-CM | POA: Diagnosis present

## 2020-01-16 DIAGNOSIS — Z794 Long term (current) use of insulin: Secondary | ICD-10-CM

## 2020-01-16 DIAGNOSIS — A539 Syphilis, unspecified: Secondary | ICD-10-CM

## 2020-01-16 LAB — CBC WITH DIFFERENTIAL/PLATELET
Abs Immature Granulocytes: 0.06 10*3/uL (ref 0.00–0.07)
Basophils Absolute: 0.1 10*3/uL (ref 0.0–0.1)
Basophils Relative: 1 %
Eosinophils Absolute: 0.2 10*3/uL (ref 0.0–0.5)
Eosinophils Relative: 2 %
HCT: 44.4 % (ref 36.0–46.0)
Hemoglobin: 13.5 g/dL (ref 12.0–15.0)
Immature Granulocytes: 1 %
Lymphocytes Relative: 11 %
Lymphs Abs: 1.2 10*3/uL (ref 0.7–4.0)
MCH: 27 pg (ref 26.0–34.0)
MCHC: 30.4 g/dL (ref 30.0–36.0)
MCV: 88.8 fL (ref 80.0–100.0)
Monocytes Absolute: 0.8 10*3/uL (ref 0.1–1.0)
Monocytes Relative: 7 %
Neutro Abs: 9.3 10*3/uL — ABNORMAL HIGH (ref 1.7–7.7)
Neutrophils Relative %: 78 %
Platelets: 247 10*3/uL (ref 150–400)
RBC: 5 MIL/uL (ref 3.87–5.11)
RDW: 13.1 % (ref 11.5–15.5)
WBC: 11.6 10*3/uL — ABNORMAL HIGH (ref 4.0–10.5)
nRBC: 0 % (ref 0.0–0.2)

## 2020-01-16 LAB — WET PREP, GENITAL
Sperm: NONE SEEN
Yeast Wet Prep HPF POC: NONE SEEN

## 2020-01-16 LAB — RAPID URINE DRUG SCREEN, HOSP PERFORMED
Amphetamines: NOT DETECTED
Barbiturates: NOT DETECTED
Benzodiazepines: NOT DETECTED
Cocaine: POSITIVE — AB
Opiates: NOT DETECTED
Tetrahydrocannabinol: NOT DETECTED

## 2020-01-16 LAB — BASIC METABOLIC PANEL
Anion gap: 9 (ref 5–15)
BUN: 9 mg/dL (ref 6–20)
CO2: 33 mmol/L — ABNORMAL HIGH (ref 22–32)
Calcium: 9.4 mg/dL (ref 8.9–10.3)
Chloride: 93 mmol/L — ABNORMAL LOW (ref 98–111)
Creatinine, Ser: 0.51 mg/dL (ref 0.44–1.00)
GFR calc Af Amer: >60 mL/min (ref >60)
GFR calc non Af Amer: >60 mL/min (ref >60)
Glucose, Bld: 358 mg/dL — ABNORMAL HIGH (ref 70–99)
Potassium: 4.1 mmol/L (ref 3.5–5.1)
Sodium: 135 mmol/L (ref 135–145)

## 2020-01-16 LAB — URINALYSIS, ROUTINE W REFLEX MICROSCOPIC
Bilirubin Urine: NEGATIVE
Glucose, UA: >=500 mg/dL — AB
Ketones, ur: 5 mg/dL — AB
Nitrite: NEGATIVE
Protein, ur: NEGATIVE mg/dL
Specific Gravity, Urine: 1.025 (ref 1.005–1.030)
WBC, UA: >50 WBC/hpf — ABNORMAL HIGH (ref 0–5)
pH: 5 (ref 5.0–8.0)

## 2020-01-16 LAB — GC/CHLAMYDIA PROBE AMP (~~LOC~~) NOT AT ARMC
Chlamydia: NEGATIVE
Comment: NEGATIVE
Comment: NORMAL
Neisseria Gonorrhea: NEGATIVE

## 2020-01-16 LAB — GLUCOSE, CAPILLARY: Glucose-Capillary: 231 mg/dL — ABNORMAL HIGH (ref 70–99)

## 2020-01-16 LAB — RPR
RPR Ser Ql: REACTIVE — AB
RPR Titer: 1:64 {titer}

## 2020-01-16 LAB — HEMOGLOBIN A1C
Hgb A1c MFr Bld: 12 % — ABNORMAL HIGH (ref 4.8–5.6)
Mean Plasma Glucose: 297.7 mg/dL

## 2020-01-16 LAB — SARS CORONAVIRUS 2 BY RT PCR (HOSPITAL ORDER, PERFORMED IN ~~LOC~~ HOSPITAL LAB): SARS Coronavirus 2: NEGATIVE

## 2020-01-16 LAB — HIV ANTIBODY (ROUTINE TESTING W REFLEX): HIV Screen 4th Generation wRfx: NONREACTIVE

## 2020-01-16 LAB — CBG MONITORING, ED: Glucose-Capillary: 262 mg/dL — ABNORMAL HIGH (ref 70–99)

## 2020-01-16 MED ORDER — PIPERACILLIN-TAZOBACTAM 3.375 G IVPB: 3.3750 g | Freq: Three times a day (TID) | INTRAVENOUS | Status: DC

## 2020-01-16 MED ORDER — INSULIN ASPART 100 UNIT/ML ~~LOC~~ SOLN
0.0000 [IU] | Freq: Three times a day (TID) | SUBCUTANEOUS | Status: DC
  Administered 2020-01-16: 8 [IU] via SUBCUTANEOUS
  Administered 2020-01-17: 11 [IU] via SUBCUTANEOUS
  Administered 2020-01-17: 15 [IU] via SUBCUTANEOUS
  Administered 2020-01-17: 2 [IU] via SUBCUTANEOUS
  Administered 2020-01-18: 11 [IU] via SUBCUTANEOUS
  Administered 2020-01-18: 15 [IU] via SUBCUTANEOUS
  Administered 2020-01-19: 11 [IU] via SUBCUTANEOUS
  Administered 2020-01-19: 2 [IU] via SUBCUTANEOUS
  Administered 2020-01-19: 8 [IU] via SUBCUTANEOUS
  Administered 2020-01-20: 5 [IU] via SUBCUTANEOUS
  Administered 2020-01-20: 2 [IU] via SUBCUTANEOUS

## 2020-01-16 MED ORDER — AZITHROMYCIN 250 MG PO TABS: 1000.0000 mg | ORAL_TABLET | Freq: Once | ORAL | Status: DC

## 2020-01-16 MED ORDER — HYDROXYZINE HCL 10 MG PO TABS
10.0000 mg | ORAL_TABLET | Freq: Three times a day (TID) | ORAL | Status: DC | PRN
  Administered 2020-01-19: 10 mg via ORAL
  Filled 2020-01-16 (×3): qty 1

## 2020-01-16 MED ORDER — ONDANSETRON 4 MG PO TBDP
8.0000 mg | ORAL_TABLET | Freq: Once | ORAL | Status: AC
  Administered 2020-01-16: 8 mg via ORAL
  Filled 2020-01-16: qty 2

## 2020-01-16 MED ORDER — METRONIDAZOLE 500 MG PO TABS
2000.0000 mg | ORAL_TABLET | Freq: Once | ORAL | Status: AC
  Administered 2020-01-16: 2000 mg via ORAL
  Filled 2020-01-16: qty 4

## 2020-01-16 MED ORDER — VANCOMYCIN HCL IN DEXTROSE 1-5 GM/200ML-% IV SOLN: 1000.0000 mg | Freq: Two times a day (BID) | INTRAVENOUS | Status: DC

## 2020-01-16 MED ORDER — SODIUM CHLORIDE 0.9 % IV SOLN
2.0000 g | Freq: Three times a day (TID) | INTRAVENOUS | Status: DC
  Administered 2020-01-16: 2 g via INTRAVENOUS
  Filled 2020-01-16: qty 2

## 2020-01-16 MED ORDER — VANCOMYCIN HCL 1500 MG/300ML IV SOLN
1500.0000 mg | Freq: Once | INTRAVENOUS | Status: AC
  Administered 2020-01-16: 1500 mg via INTRAVENOUS
  Filled 2020-01-16: qty 300

## 2020-01-16 MED ORDER — ONDANSETRON 4 MG PO TBDP: 8.0000 mg | ORAL_TABLET | Freq: Once | ORAL | Status: DC

## 2020-01-16 MED ORDER — INSULIN DETEMIR 100 UNIT/ML ~~LOC~~ SOLN
30.0000 [IU] | Freq: Every day | SUBCUTANEOUS | Status: DC
  Administered 2020-01-16: 30 [IU] via SUBCUTANEOUS
  Filled 2020-01-16 (×2): qty 0.3

## 2020-01-16 MED ORDER — ONDANSETRON 4 MG PO TBDP: 4.0000 mg | ORAL_TABLET | Freq: Three times a day (TID) | ORAL | Status: DC | PRN

## 2020-01-16 MED ORDER — ENOXAPARIN SODIUM 40 MG/0.4ML ~~LOC~~ SOLN
40.0000 mg | SUBCUTANEOUS | Status: DC
  Administered 2020-01-16 – 2020-01-19 (×4): 40 mg via SUBCUTANEOUS
  Filled 2020-01-16 (×5): qty 0.4

## 2020-01-16 MED ORDER — AZITHROMYCIN 250 MG PO TABS
1000.0000 mg | ORAL_TABLET | Freq: Once | ORAL | Status: AC
  Administered 2020-01-16: 1000 mg via ORAL
  Filled 2020-01-16: qty 4

## 2020-01-16 MED ORDER — TRAMADOL HCL 50 MG PO TABS
50.0000 mg | ORAL_TABLET | Freq: Four times a day (QID) | ORAL | Status: DC | PRN
  Administered 2020-01-16 – 2020-01-20 (×5): 50 mg via ORAL
  Filled 2020-01-16 (×5): qty 1

## 2020-01-16 MED ORDER — CEFTRIAXONE SODIUM 500 MG IJ SOLR: 250.0000 mg | Freq: Once | INTRAMUSCULAR | Status: DC

## 2020-01-16 MED ORDER — CEFTRIAXONE SODIUM 500 MG IJ SOLR
500.0000 mg | Freq: Once | INTRAMUSCULAR | Status: AC
  Administered 2020-01-16: 500 mg via INTRAMUSCULAR
  Filled 2020-01-16: qty 500

## 2020-01-16 MED ORDER — LIDOCAINE HCL (PF) 1 % IJ SOLN
INTRAMUSCULAR | Status: AC
  Administered 2020-01-16: 5 mL
  Filled 2020-01-16: qty 5

## 2020-01-16 MED ORDER — METRONIDAZOLE 500 MG PO TABS: 2000.0000 mg | ORAL_TABLET | Freq: Once | ORAL | Status: DC

## 2020-01-16 MED ORDER — PENICILLIN G BENZATHINE 1200000 UNIT/2ML IM SUSP
2.4000 10*6.[IU] | Freq: Once | INTRAMUSCULAR | Status: AC
  Administered 2020-01-16: 2.4 10*6.[IU] via INTRAMUSCULAR
  Filled 2020-01-16: qty 4

## 2020-01-16 MED ORDER — CEFTRIAXONE SODIUM 500 MG IJ SOLR: 500.0000 mg | Freq: Once | INTRAMUSCULAR | Status: DC

## 2020-01-16 NOTE — Progress Notes (Signed)
Pharmacy Antibiotic Note  Bianca Yang is a 44 y.o. female admitted on 01/15/2020 with wound infection/osteomyelitis .  Pharmacy has been consulted for Vancomycin/Zosyn dosing. WBC mildly elevated. Renal function good.   Plan: Vancomycin 1500 mg IV x 1, then 1000 mg IV q12h Zosyn 3.375G IV q8h to be infused over 4 hours Trend WBC, temp, renal function  F/U infectious work-up Drug levels as indicated   Height: 5\' 7"  (170.2 cm) Weight: 74.8 kg (165 lb) IBW/kg (Calculated) : 61.6  Temp (24hrs), Avg:98.3 F (36.8 C), Min:97.9 F (36.6 C), Max:98.8 F (37.1 C)  Recent Labs  Lab 01/16/20 0504  WBC 11.6*  CREATININE 0.51    Estimated Creatinine Clearance: 94.8 mL/min (by C-G formula based on SCr of 0.51 mg/dL).    Allergies  Allergen Reactions  . Sulfa Antibiotics Other (See Comments)    Unknown/ childhood allergy.    01/18/20, PharmD, BCPS Clinical Pharmacist Phone: [phone]

## 2020-01-16 NOTE — Progress Notes (Signed)
Inpatient Diabetes Program Recommendations  AACE/ADA: New Consensus Statement on Inpatient Glycemic Control (2015)  Target Ranges:  Prepandial:   less than 140 mg/dL      Peak postprandial:   less than 180 mg/dL (1-2 hours)      Critically ill patients:  140 - 180 mg/dL   Lab Results  Component Value Date   GLUCAP 260 (H) 08/20/2017   HGBA1C 11.4 (H) 11/04/2016    Review of Glycemic Control  Diabetes history: DM 2 Outpatient Diabetes medications: Levemir and metformin in past unable to get according to pt Current orders for Inpatient glycemic control:  Levemir 30 units qhs  Inpatient Diabetes Program Recommendations:    -  Consider adding Novolog 0-15 units tid and hs.  Spoke with pt at bedside. Pt does not have insulin or glucose meter at home. Pt has already had a match letter earlier this year. Pt has had appointment with [facility] in the past pt reports going. Pt does not tell me exactly when she last saw her PCP in the clinic.  Pt not wanting to speak long with her more worried about getting rest. Pt very brash in her answers.   Pt would benefit from app[facility] being [facility] and there she can get her insulin, metformin, and meter needed.  Thanks,  Christena Deem RN, MSN, BC-ADM Inpatient Diabetes Coordinator Team Pager [phone] (8a-5p)

## 2020-01-16 NOTE — ED Notes (Signed)
Patient transported to X-ray 

## 2020-01-16 NOTE — Consult Note (Signed)
Reason for Consult:Right great toe osteo Referring Physician: Roger Shelter  Bianca Yang is an 44 y.o. female.  HPI: Neliah came to the ED with a 2 weeks hx/o right foot pain, redness, and swelling. She has had similar episodes of cellulitis. She denies fevers, chills, sweats, N/V. She also denies any known trauma to foot or toes. 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 She is adamant currently that she is not interested in amputation.  Past Medical History:  Diagnosis Date  . Diabetes mellitus without complication [facility])     Past Surgical History:  Procedure Laterality Date  . TUBAL LIGATION      Family History  Problem Relation Age of Onset  . Diabetes Mother     Social History:  reports that she has been smoking cigarettes. She has been smoking about 0.50 packs per day. She has never used smokeless tobacco. She reports current alcohol use. She reports that she does not use drugs.  Allergies:  Allergies  Allergen Reactions  . Sulfa Antibiotics Other (See Comments)    Unknown/ childhood allergy.     Medications: I have reviewed the patient's current medications.  Results for orders placed or performed during the hospital encounter of 01/15/20 (from the past 48 hour(s))  CBC with Differential     Status: Abnormal   Collection Time: 01/16/20  5:04 AM  Result Value Ref Range   WBC 11.6 (H) 4.0 - 10.5 K/uL   RBC 5.00 3.87 - 5.11 MIL/uL   Hemoglobin 13.5 12.0 - 15.0 g/dL   HCT 75.1 36 - 46 %   MCV 88.8 80.0 - 100.0 fL   MCH 27.0 26.0 - 34.0 pg   MCHC 30.4 30.0 - 36.0 g/dL   RDW 02.5 85.2 - 77.8 %   Platelets 247 150 - 400 K/uL   nRBC 0.0 0.0 - 0.2 %   Neutrophils Relative % 78 %   Neutro Abs 9.3 (H) 1.7 - 7.7 K/uL   Lymphocytes Relative 11 %   Lymphs Abs 1.2 0.7 - 4.0 K/uL   Monocytes Relative 7 %   Monocytes Absolute 0.8 0 - 1 K/uL   Eosinophils Relative 2 %   Eosinophils Absolute 0.2 0 - 0 K/uL   Basophils Relative 1 %    Basophils Absolute 0.1 0 - 0 K/uL   Immature Granulocytes 1 %   Abs Immature Granulocytes 0.06 0.00 - 0.07 K/uL    Comment: Performed at [facility], [address], [place], [place] [address]  Basic metabolic panel     Status: Abnormal   Collection Time: 01/16/20  5:04 AM  Result Value Ref Range   Sodium 135 135 - 145 mmol/L   Potassium 4.1 3.5 - 5.1 mmol/L   Chloride 93 (L) 98 - 111 mmol/L   CO2 33 (H) 22 - 32 mmol/L   Glucose, Bld 358 (H) 70 - 99 mg/dL    Comment: Glucose reference range applies only to samples taken after fasting for at least 8 hours.   BUN 9 6 - 20 mg/dL   Creatinine, Ser 3.61 0.44 - 1.00 mg/dL   Calcium 9.4 8.9 - 44.3 mg/dL   GFR calc non Af Amer >60 >60 mL/min   GFR calc Af Amer >60 >60 mL/min   Anion gap 9 5 - 15    Comment: Performed at [facility], [address], [place], [place] [address]  Wet prep, genital     Status:  Abnormal   Collection Time: 01/16/20  5:33 AM   Specimen: PATH Cytology Cervicovaginal Ancillary Only  Result Value Ref Range   Yeast Wet Prep HPF POC NONE SEEN NONE SEEN   Trich, Wet Prep PRESENT (A) NONE SEEN   Clue Cells Wet Prep HPF POC PRESENT (A) NONE SEEN   WBC, Wet Prep HPF POC MANY (A) NONE SEEN   Sperm NONE SEEN     Comment: Performed at [facility], [address] [place], [place] [address]  SARS Coronavirus 2 by RT PCR (hospital order, performed in [facility] hospital lab) Nasopharyngeal Nasopharyngeal Swab     Status: None   Collection Time: 01/16/20  6:37 AM   Specimen: Nasopharyngeal Swab  Result Value Ref Range   SARS Coronavirus 2 NEGATIVE NEGATIVE    Comment: (NOTE) SARS-CoV-2 target nucleic acids are NOT DETECTED.  The SARS-CoV-2 RNA is generally detectable in upper and lower respiratory specimens during the acute phase of infection. The lowest concentration of SARS-CoV-2 viral copies this assay can detect is 250 copies / mL. A negative result does not preclude SARS-CoV-2 infection and  should not be used as the sole basis for treatment or other patient management decisions.  A negative result may occur with improper specimen collection / handling, submission of specimen other than nasopharyngeal swab, presence of viral mutation(s) within the areas targeted by this assay, and inadequate number of viral copies (<250 copies / mL). A negative result must be combined with clinical observations, patient history, and epidemiological information.  Fact Sheet for Patients:   BoilerBrush.com.cy  Fact Sheet for Healthcare Providers: https://pope.com/  This test is not yet approved or  cleared by the [place] FDA and has been authorized for detection and/or diagnosis of SARS-CoV-2 by FDA under an Emergency Use Authorization (EUA).  This EUA will remain in effect (meaning this test can be used) for the duration of the COVID-19 declaration under Section 564(b)(1) of the Act, 21 U.S.C. section 360bbb-3(b)(1), unless the authorization is terminated or revoked sooner.  Performed at [facility], [address], [place], [place] [address]     DG Tibia/Fibula Right  Result Date: 01/16/2020 CLINICAL DATA:  Right lower extremity swelling. EXAM: RIGHT TIBIA AND FIBULA - 2 VIEW COMPARISON:  None. FINDINGS: The right knee and ankle joints are maintained. No acute bony findings or destructive bony changes. IMPRESSION: No acute bony findings. Electronically Signed   By: Rudie Meyer M.D.   On: 01/16/2020 05:39   DG Foot Complete Right  Result Date: 01/16/2020 CLINICAL DATA:  Pain, swelling and redness of the right foot for 1 week. EXAM: RIGHT FOOT COMPLETE - 3+ VIEW COMPARISON:  None. FINDINGS: Marked soft tissue swelling is noted along the lateral aspect of the forefoot. The base of the fifth proximal phalanx is destroyed. Findings consistent with osteomyelitis. No involvement of the fifth MTP joint to suggest septic arthritis. The  other bony structures are intact. I do not see any other obvious areas of osteomyelitis. There is diffuse soft tissue swelling/edema suggesting cellulitis. Calcaneal spurring changes are noted. IMPRESSION: 1. Plain film findings consistent with osteomyelitis involving the base of the fifth proximal phalanx. 2. Diffuse soft tissue swelling/edema suggesting cellulitis. Electronically Signed   By: Rudie Meyer M.D.   On: 01/16/2020 05:38   VAS [place] LOWER EXTREMITY VENOUS (DVT) (ONLY [facility] & [facility])  Result Date: 01/15/2020  Lower Venous DVTStudy Indications: Swelling.  Risk Factors: None identified. Limitations: Patient movement, patient pain tolerance.  Comparison Study: No prior studies. Performing Technologist: Chanda BusingGregory Collins RVT  Examination Guidelines: A complete evaluation includes B-mode imaging, spectral Doppler, color Doppler, and power Doppler as needed of all accessible portions of each vessel. Bilateral testing is considered an integral part of a complete examination. Limited examinations for reoccurring indications may be performed as noted. The reflux portion of the exam is performed with the patient in reverse Trendelenburg.  +---------+---------------+---------+-----------+----------+--------------+ RIGHT    CompressibilityPhasicitySpontaneityPropertiesThrombus Aging +---------+---------------+---------+-----------+----------+--------------+ CFV      Full           Yes      Yes                                 +---------+---------------+---------+-----------+----------+--------------+ SFJ      Full                                                        +---------+---------------+---------+-----------+----------+--------------+ FV Prox  Full                                                        +---------+---------------+---------+-----------+----------+--------------+ FV Mid   Full                                                         +---------+---------------+---------+-----------+----------+--------------+ FV DistalFull                                                        +---------+---------------+---------+-----------+----------+--------------+ PFV      Full                                                        +---------+---------------+---------+-----------+----------+--------------+ POP      Full           Yes      Yes                                 +---------+---------------+---------+-----------+----------+--------------+ PTV      Full                                                        +---------+---------------+---------+-----------+----------+--------------+ PERO     Full                                                        +---------+---------------+---------+-----------+----------+--------------+   +----+---------------+---------+-----------+----------+--------------+  LEFTCompressibilityPhasicitySpontaneityPropertiesThrombus Aging +----+---------------+---------+-----------+----------+--------------+ CFV Full           Yes      Yes                                 +----+---------------+---------+-----------+----------+--------------+     Summary: RIGHT: - There is no evidence of deep vein thrombosis in the lower extremity.  - No cystic structure found in the popliteal fossa.  LEFT: - No evidence of common femoral vein obstruction.  *See table(s) above for measurements and observations.    Preliminary     Review of Systems  HENT: Negative for ear discharge, ear pain, hearing loss and tinnitus.   Eyes: Negative for photophobia and pain.  Respiratory: Negative for cough and shortness of breath.   Cardiovascular: Negative for chest pain.  Gastrointestinal: Negative for abdominal pain, nausea and vomiting.  Genitourinary: Negative for dysuria, flank pain, frequency and urgency.  Musculoskeletal: Positive for arthralgias (Right foot). Negative for back pain, myalgias and  neck pain.  Neurological: Negative for dizziness and headaches.  Hematological: Does not bruise/bleed easily.  Psychiatric/Behavioral: The patient is not nervous/anxious.    Blood pressure 114/74, pulse 79, temperature 98.5 F (36.9 C), temperature source Oral, resp. rate 12, height 5\' 7"  (1.702 m), weight 74.8 kg, SpO2 100 %. Physical Exam Constitutional:      General: She is not in acute distress.    Appearance: She is well-developed. She is not diaphoretic.  HENT:     Head: Normocephalic and atraumatic.  Eyes:     General: No scleral icterus.       Right eye: No discharge.        Left eye: No discharge.     Conjunctiva/sclera: Conjunctivae normal.  Cardiovascular:     Rate and Rhythm: Normal rate and regular rhythm.  Pulmonary:     Effort: Pulmonary effort is normal. No respiratory distress.  Musculoskeletal:     Cervical back: Normal range of motion.     Comments: RLE No traumatic wounds, ecchymosis, or rash  Severe TTP diffuse right foot, mild patchy erythema of same  No knee or ankle effusion  Knee stable to varus/ valgus and anterior/posterior stress  Sens DPN, SPN, TN intact  Motor EHL, ext, flex, evers 5/5  DP 2+, PT 2+, No significant edema  Skin:    General: Skin is warm and dry.  Neurological:     Mental Status: She is alert.  Psychiatric:        Behavior: Behavior normal.     Assessment/Plan: Right great toe osteo -- I explained the usual natural history of her infection, the causes, and the likely course of recurrent infections potentially leading to sepsis without amputation of the affected digit. At this point she is not willing to entertain surgery. I recommend that she f/u with Dr. as an outpatient. Please call should she change her mind about surgery. DM    Lajoyce Corners, PA-C Orthopedic Surgery [phone] 01/16/2020, 9:08 AM

## 2020-01-16 NOTE — ED Notes (Signed)
Dinner Tray Ordered @ 1709. 

## 2020-01-16 NOTE — ED Notes (Signed)
Breakfast Tray Ordered @ [id number].

## 2020-01-16 NOTE — ED Provider Notes (Addendum)
[facility] EMERGENCY DEPARTMENT Provider Note   CSN: [id number] Arrival date & time: 01/15/20  1815     History Chief Complaint  Patient presents with  . Foot Pain  . Vaginal Pain    Bianca Yang is a 44 y.o. female past medical history significant for diabetes and cellulitis, PID, tubo-ovarian abscess.  HPI Patient presents to emergency department today with chief complaint of right foot pain x1 week.  She states the pain is progressively worsened.  She describes it as a throbbing sensation.  She states the pain is constant and is worse with ambulation.  She thinks her right leg looks swollen.  She denies any injury to the leg, however does states she has a callus on the bottom of her right great toe.  She is a history of cellulitis in the past and thinks this is similar.  Patient also thinks she has syphilis. She has malodorous vaginal discharge and unprotected sex.  No medications were taken for symptoms prior to arrival.  She denies any fever, chills, shortness of breath, chest pain, abdominal pain, nausea, emesis, pelvic pain, abnormal vaginal bleeding, lower extremity numbness, weakness or tingling.    Past Medical History:  Diagnosis Date  . Diabetes mellitus without complication [facility])     Patient Active Problem List   Diagnosis Date Noted  . Cellulitis 11/05/2016  . 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 Intractable vomiting   . Diabetes mellitus type 2 in obese (HCC) 01/14/2016  . Hypoxia 01/14/2016  . Tubo-ovarian abscess 01/13/2016  . CONTACT DERMATITIS 12/04/2008    Past Surgical History:  Procedure Laterality Date  . TUBAL LIGATION       OB History   No obstetric history on file.     Family History  Problem Relation Age of Onset  . Diabetes Mother     Social History   Tobacco Use  . Smoking status: Current Every Day Smoker    Packs/day: 0.50    Types: Cigarettes  . Smokeless tobacco: Never Used  Substance Use  Topics  . Alcohol use: Yes    Comment: socially  . Drug use: No    Home Medications Prior to Admission medications   Medication Sig Start Date End Date Taking? Authorizing Provider  cetirizine (ZYRTEC ALLERGY) 10 MG tablet Take 1 tablet (10 mg total) by mouth daily. Patient not taking: Reported on 01/16/2020 07/04/17   Roxy Horseman, PA-C  clindamycin (CLEOCIN) 300 MG capsule Take 1 capsule (300 mg total) by mouth 4 (four) times daily. X 7 days Patient not taking: Reported on 01/16/2020 08/20/17   Fayrene Helper, PA-C  dicyclomine (BENTYL) 20 MG tablet Take one every 6-8 hours for abd cramps Patient not taking: Reported on 01/16/2020 02/24/17   Bethann Berkshire, MD  HYDROcodone-acetaminophen (NORCO/VICODIN) 5-325 MG tablet Take 1 tablet by mouth every 6 (six) hours as needed for moderate pain or severe pain. Patient not taking: Reported on 01/16/2020 03/28/18   Fayrene Helper, PA-C  hydrOXYzine (ATARAX/VISTARIL) 25 MG tablet Take 1 tablet (25 mg total) by mouth every 6 (six) hours. Patient not taking: Reported on 01/16/2020 12/10/17   Kellie Shropshire, PA-C  ibuprofen (ADVIL,MOTRIN) 800 MG tablet Take 1 tablet (800 mg total) by mouth every 8 (eight) hours as needed for moderate pain. Patient not taking: Reported on 01/16/2020 08/20/17   Fayrene Helper, PA-C  insulin detemir (LEVEMIR) 100 UNIT/ML injection Inject 0.32 mLs (32 Units total) into the skin at bedtime. Patient not taking:  Reported on 01/16/2020 07/04/17   Roxy HorsemanBrowning, Robert, PA-C  metFORMIN (GLUCOPHAGE-XR) 500 MG 24 hr tablet 2 daily with breakfast Patient not taking: Reported on 01/16/2020 07/04/17   Roxy HorsemanBrowning, Robert, PA-C  ondansetron (ZOFRAN ODT) 4 MG disintegrating tablet 4mg  ODT q4 hours prn nausea/vomit Patient not taking: Reported on 01/16/2020 02/24/17   Bethann BerkshireZammit, Joseph, MD  traMADol (ULTRAM) 50 MG tablet Take 1 tablet (50 mg total) by mouth every 6 (six) hours as needed. Patient not taking: Reported on 01/16/2020 12/10/17   Kellie ShropshireShrosbree, Emily J, PA-C      Allergies    Sulfa antibiotics  Review of Systems   Review of Systems  All other systems are reviewed and are negative for acute change except as noted in the HPI.   Physical Exam Updated Vital Signs BP 120/88   Pulse 88   Temp 98.6 F (37 C)   Resp 19   Ht 5\' 7"  (1.702 m)   Wt 74.8 kg   SpO2 98%   BMI 25.84 kg/m   Physical Exam Vitals and nursing note reviewed.  Constitutional:      General: She is not in acute distress.    Appearance: She is not ill-appearing.  HENT:     Head: Normocephalic and atraumatic.     Right Ear: Tympanic membrane and external ear normal.     Left Ear: Tympanic membrane and external ear normal.     Nose: Nose normal.     Mouth/Throat:     Mouth: Mucous membranes are moist.     Pharynx: Oropharynx is clear.  Eyes:     General: No scleral icterus.       Right eye: No discharge.        Left eye: No discharge.     Extraocular Movements: Extraocular movements intact.     Conjunctiva/sclera: Conjunctivae normal.     Pupils: Pupils are equal, round, and reactive to light.  Neck:     Vascular: No JVD.  Cardiovascular:     Rate and Rhythm: Normal rate and regular rhythm.     Pulses: Normal pulses.          Radial pulses are 2+ on the right side and 2+ on the left side.     Heart sounds: Normal heart sounds.  Pulmonary:     Comments: Lungs clear to auscultation in all fields. Symmetric chest rise. No wheezing, rales, or rhonchi. Abdominal:     Tenderness: There is no right CVA tenderness or left CVA tenderness.     Comments: Abdomen is soft, non-distended, and non-tender in all quadrants. No rigidity, no guarding. No peritoneal signs.  Genitourinary:    Comments: Swelling noted to left labia. No pain with speculum insertion. Closed cervical os with normal appearance - no rash or lesions. Copious amount of thick yellow discharge seen in vaginal vault. No bleeding.  On bimanual examination no adnexal tenderness or cervical motion tenderness.  Chaperone Melanie present during exam.  Musculoskeletal:        General: Normal range of motion.     Cervical back: Normal range of motion.     Comments: Bilateral 2+ dorsalis pedis pulse.  Sensation is intact to sharp and dull touch in bilateral lower extremities.  She has erythema on the dorsum of right foot that extends up anterior right shin.  Exquisitely tender to palpation.  There is no palpable fluctuance or gross abscess.  She has an ulcer on plantar aspect of right great toe, no other breaks in skin  seen.  Swelling and erythema noted on right pinky toe with tenderness to palpation. Please see media below.  Skin:    General: Skin is warm and dry.     Capillary Refill: Capillary refill takes less than 2 seconds.  Neurological:     Mental Status: She is oriented to person, place, and time.     GCS: GCS eye subscore is 4. GCS verbal subscore is 5. GCS motor subscore is 6.     Comments: Fluent speech, no facial droop.  Psychiatric:        Behavior: Behavior normal.         ED Results / Procedures / Treatments   Labs (all labs ordered are listed, but only abnormal results are displayed) Labs Reviewed  WET PREP, GENITAL - Abnormal; Notable for the following components:      Result Value   Trich, Wet Prep PRESENT (*)    Clue Cells Wet Prep HPF POC PRESENT (*)    WBC, Wet Prep HPF POC MANY (*)    All other components within normal limits  CBC WITH DIFFERENTIAL/PLATELET - Abnormal; Notable for the following components:   WBC 11.6 (*)    Neutro Abs 9.3 (*)    All other components within normal limits  BASIC METABOLIC PANEL - Abnormal; Notable for the following components:   Chloride 93 (*)    CO2 33 (*)    Glucose, Bld 358 (*)    All other components within normal limits  SARS CORONAVIRUS 2 BY RT PCR (HOSPITAL ORDER, PERFORMED IN [facility])  CULTURE, BLOOD (ROUTINE X 2)  CULTURE, BLOOD (ROUTINE X 2)  RPR  URINALYSIS, ROUTINE W REFLEX MICROSCOPIC  HIV  ANTIBODY (ROUTINE TESTING W REFLEX)  RAPID URINE DRUG SCREEN, HOSP PERFORMED  I-STAT BETA HCG BLOOD, ED ([facility], [facility], [facility] ONLY)  GC/CHLAMYDIA PROBE AMP ([place]) NOT AT [facility]    EKG None  Radiology DG Tibia/Fibula Right  Result Date: 01/16/2020 CLINICAL DATA:  Right lower extremity swelling. EXAM: RIGHT TIBIA AND FIBULA - 2 VIEW COMPARISON:  None. FINDINGS: The right knee and ankle joints are maintained. No acute bony findings or destructive bony changes. IMPRESSION: No acute bony findings. Electronically Signed   By: Rudie Meyer M.D.   On: 01/16/2020 05:39   DG Foot Complete Right  Result Date: 01/16/2020 CLINICAL DATA:  Pain, swelling and redness of the right foot for 1 week. EXAM: RIGHT FOOT COMPLETE - 3+ VIEW COMPARISON:  None. FINDINGS: Marked soft tissue swelling is noted along the lateral aspect of the forefoot. The base of the fifth proximal phalanx is destroyed. Findings consistent with osteomyelitis. No involvement of the fifth MTP joint to suggest septic arthritis. The other bony structures are intact. I do not see any other obvious areas of osteomyelitis. There is diffuse soft tissue swelling/edema suggesting cellulitis. Calcaneal spurring changes are noted. IMPRESSION: 1. Plain film findings consistent with osteomyelitis involving the base of the fifth proximal phalanx. 2. Diffuse soft tissue swelling/edema suggesting cellulitis. Electronically Signed   By: Rudie Meyer M.D.   On: 01/16/2020 05:38   VAS [place] LOWER EXTREMITY VENOUS (DVT) (ONLY [facility] & [facility])  Result Date: 01/15/2020  Lower Venous DVTStudy Indications: Swelling.  Risk Factors: None identified. Limitations: Patient movement, patient pain tolerance. Comparison Study: No prior studies. Performing Technologist: Chanda Busing RVT  Examination Guidelines: A complete evaluation includes B-mode imaging, spectral Doppler, color Doppler, and power Doppler as needed of all accessible portions of each vessel. Bilateral testing  is  considered an integral part of a complete examination. Limited examinations for reoccurring indications may be performed as noted. The reflux portion of the exam is performed with the patient in reverse Trendelenburg.  +---------+---------------+---------+-----------+----------+--------------+ RIGHT    CompressibilityPhasicitySpontaneityPropertiesThrombus Aging +---------+---------------+---------+-----------+----------+--------------+ CFV      Full           Yes      Yes                                 +---------+---------------+---------+-----------+----------+--------------+ SFJ      Full                                                        +---------+---------------+---------+-----------+----------+--------------+ FV Prox  Full                                                        +---------+---------------+---------+-----------+----------+--------------+ FV Mid   Full                                                        +---------+---------------+---------+-----------+----------+--------------+ FV DistalFull                                                        +---------+---------------+---------+-----------+----------+--------------+ PFV      Full                                                        +---------+---------------+---------+-----------+----------+--------------+ POP      Full           Yes      Yes                                 +---------+---------------+---------+-----------+----------+--------------+ PTV      Full                                                        +---------+---------------+---------+-----------+----------+--------------+ PERO     Full                                                        +---------+---------------+---------+-----------+----------+--------------+   +----+---------------+---------+-----------+----------+--------------+  LEFTCompressibilityPhasicitySpontaneityPropertiesThrombus Aging +----+---------------+---------+-----------+----------+--------------+ CFV Full  Yes      Yes                                 +----+---------------+---------+-----------+----------+--------------+     Summary: RIGHT: - There is no evidence of deep vein thrombosis in the lower extremity.  - No cystic structure found in the popliteal fossa.  LEFT: - No evidence of common femoral vein obstruction.  *See table(s) above for measurements and observations.    Preliminary     Procedures Procedures (including critical care time)  Medications Ordered in ED Medications  vancomycin (VANCOREADY) IVPB 1500 mg/300 mL (1,500 mg Intravenous New Bag/Given 01/16/20 0653)  piperacillin-tazobactam (ZOSYN) IVPB 3.375 g (has no administration in time range)  azithromycin (ZITHROMAX) tablet 1,000 mg (has no administration in time range)  metroNIDAZOLE (FLAGYL) tablet 2,000 mg (has no administration in time range)  ondansetron (ZOFRAN-ODT) disintegrating tablet 8 mg (has no administration in time range)  cefTRIAXone (ROCEPHIN) injection 500 mg (has no administration in time range)  vancomycin (VANCOCIN) IVPB 1000 mg/200 mL premix (has no administration in time range)    ED Course  I have reviewed the triage vital signs and the nursing notes.  Pertinent labs & imaging results that were available during my care of the patient were reviewed by me and considered in my medical decision making (see chart for details).    MDM Rules/Calculators/A&P                          History provided by patient with additional history obtained from chart review.    44 yo uncontrolled type II diabetic presenting with right foot pain and vaginal discharge. Vitals in triage afebrile, normotensive, no tachycardia or hypoxia. Vitals do not indicate sepsis. On exam she has erythema to right shin, foot, and swelling to right pinky toe. Callus underneath  right great toe, no other open wounds. No gross abscess or palpable fluctuance.  Labs with leukocytosis 11.6, hemoglobin 13.5. BMP without severe electrolyte derangement, hyperglycemia 358, normal anion gap. Pelvic exam performed with chaperone present. No cervical motion or adnexal tenderness. No consistent with PID. She does have thick copious yellow discharge seen. Wet prep positive for trich and clue cells.  DVT study ordered in triage and is negative. Xray of right foot concerning for osteomyelitis in base of 5th metatarsal.  Consult orthopedics and discussed case with Dr. Carola Frost who is agreeable with plan to admit to hospitalist service and ortho will consult. He agrees to start antibiotics in the ED now. Blood cultures added to work up. Vanc and cefepime ordered. Findings and plan of care discussed with supervising physician Dr Clayborne Dana who agrees with plan to admit. Unassigned admission.   Spoke with Dr. Lajuana Ripple  with hospitalist service who agrees to assume care of patient and bring into the hospital for further evaluation and management.     Portions of this note were generated with Scientist, clinical (histocompatibility and immunogenetics). Dictation errors may occur despite best attempts at proofreading.  Final Clinical Impression(s) / ED Diagnoses Final diagnoses:  Osteomyelitis of right foot, unspecified type (HCC)  Trichomonas vaginitis    Rx / DC Orders ED Discharge Orders    None       Sherene Sires, PA-C 01/16/20 0736    Sherene Sires, PA-C 01/16/20 1607    Marily Memos, MD 01/17/20 0105

## 2020-01-16 NOTE — Progress Notes (Signed)
Report received from [place], [place]. Patient arrived to unit from ED. Patient sleeping, alert and arousable. Placed on O2 2L [place]. Will continue to monitor.

## 2020-01-16 NOTE — H&P (Addendum)
History and Physical    DOA: 01/15/2020  PCP: Hoy RegisterNewlin, Enobong, MD  Patient coming from: home  Chief Complaint: Right foot pain for 1 week  HPI: Bianca Yang is a 44 y.o. female with history h/o DM type II, prior pelvic inflammatory disease/tubal ovarian abscess, lower extremity cellulitis presents with complaints of right foot pain for 1 week.  Patient states she noted redness few days back.  She is quite somnolent after receiving tramadol in the ED.  She is irritable during the interview and unable to provide many details.  She denies any precipitating factors like right lower extremity trauma, insect bites or new shoes.  Patient noted to have a truncal rash and when asked about details, she states she had the rash before presenting to the hospital.  Denies any itching.  She did report abnormal vaginal discharge to EDP and work-up for STDs were sent.  She denies any alcohol or drug abuse.  Denies any fevers, chest pain or cough.  ED course:Labs with leukocytosis 11.6, hemoglobin 13.5. BMP with stable renal function, hyperglycemia 358, normal anion gap.   Doppler lower extremity negative for DVT, x-ray of the right foot however concerning for osteomyelitis at the base of fifth metatarsal.  Orthopedics, Dr. Carola FrostHandy was consulted who recommended admission to medicine with IV antibiotics and they will follow along.  Per EDP: "Pelvic exam performed with chaperone present. No cervical motion or adnexal tenderness. No consistent with PID. She does have thick copious yellow discharge seen. Wet prep positive for trich and clue cells". Patient received 1 dose of ceftriaxone, Flagyl and azithromycin for wet prep findings.  Also started on IV cefepime/vancomycin for osteomyelitis and hospitalist consulted to admit patient.  Review of Systems: As per HPI otherwise 10 point review of systems negative.    Past Medical History:  Diagnosis Date  . Diabetes mellitus without complication [facility](HCC)     Past  Surgical History:  Procedure Laterality Date  . TUBAL LIGATION      Social history:  reports that she has been smoking cigarettes. She has been smoking about 0.50 packs per day. She has never used smokeless tobacco. She reports current alcohol use. She reports that she does not use drugs.   Allergies  Allergen Reactions  . Sulfa Antibiotics Other (See Comments)    Unknown/ childhood allergy.     Family History  Problem Relation Age of Onset  . Diabetes Mother       Prior to Admission medications   Medication Sig Start Date End Date Taking? Authorizing Provider  cetirizine (ZYRTEC ALLERGY) 10 MG tablet Take 1 tablet (10 mg total) by mouth daily. Patient not taking: Reported on 01/16/2020 07/04/17   Roxy HorsemanBrowning, Robert, PA-C  clindamycin (CLEOCIN) 300 MG capsule Take 1 capsule (300 mg total) by mouth 4 (four) times daily. X 7 days Patient not taking: Reported on 01/16/2020 08/20/17   Fayrene Helperran, Bowie, PA-C  dicyclomine (BENTYL) 20 MG tablet Take one every 6-8 hours for abd cramps Patient not taking: Reported on 01/16/2020 02/24/17   Bethann BerkshireZammit, Joseph, MD  HYDROcodone-acetaminophen (NORCO/VICODIN) 5-325 MG tablet Take 1 tablet by mouth every 6 (six) hours as needed for moderate pain or severe pain. Patient not taking: Reported on 01/16/2020 03/28/18   Fayrene Helperran, Bowie, PA-C  hydrOXYzine (ATARAX/VISTARIL) 25 MG tablet Take 1 tablet (25 mg total) by mouth every 6 (six) hours. Patient not taking: Reported on 01/16/2020 12/10/17   Kellie ShropshireShrosbree, Emily J, PA-C  ibuprofen (ADVIL,MOTRIN) 800 MG tablet Take 1 tablet (800  mg total) by mouth every 8 (eight) hours as needed for moderate pain. Patient not taking: Reported on 01/16/2020 08/20/17   Fayrene Helper, PA-C  insulin detemir (LEVEMIR) 100 UNIT/ML injection Inject 0.32 mLs (32 Units total) into the skin at bedtime. Patient not taking: Reported on 01/16/2020 07/04/17   Roxy Horseman, PA-C  metFORMIN (GLUCOPHAGE-XR) 500 MG 24 hr tablet 2 daily with breakfast Patient not  taking: Reported on 01/16/2020 07/04/17   Roxy Horseman, PA-C  ondansetron (ZOFRAN ODT) 4 MG disintegrating tablet 4mg  ODT q4 hours prn nausea/vomit Patient not taking: Reported on 01/16/2020 02/24/17   02/26/17, MD  traMADol (ULTRAM) 50 MG tablet Take 1 tablet (50 mg total) by mouth every 6 (six) hours as needed. Patient not taking: Reported on 01/16/2020 12/10/17   12/12/17, PA-C    Physical Exam: Vitals:   01/16/20 0706 01/16/20 0713 01/16/20 0734 01/16/20 0824  BP:   120/76 114/74  Pulse: 94 (!) 104 83 79  Resp: 20 18 18 12   Temp:    98.5 F (36.9 C)  TempSrc:    Oral  SpO2: 99%  100%   Weight:      Height:        Constitutional: Young female, moderately built, no acute distress.  Appears somnolent Eyes: PERRL, lids and conjunctivae normal ENMT: Mucous membranes are moist. Posterior pharynx clear of any exudate or lesions.poor dentition.  Neck: normal, supple, no masses, no thyromegaly Respiratory: clear to auscultation bilaterally, no wheezing, no crackles. Normal respiratory effort. No accessory muscle use.  Cardiovascular: Regular rate and rhythm, no murmurs / rubs / gallops. No extremity edema. 2+ pedal pulses. No carotid bruits.  Abdomen: no tenderness, no masses palpated. No hepatosplenomegaly. Bowel sounds positive.  Musculoskeletal: Cellulitic changes in right lower extremity as described below, no clubbing / cyanosis. No joint deformity upper and lower extremities. Good ROM, no contractures. Normal muscle tone.  Neurologic: CN 2-12 grossly intact. Sensation intact, DTR normal. Strength 5/5 in all 4.  Psychiatric: Somnolent, irritable.  Poor judgment and insight.  Oriented x3 SKIN/catheters: Area of redness, warmth and tenderness along lower third of right lower extremity and right foot as shown below.  Also has maculopapular truncal drug rash (anterior greater than posterior as shown below).  Has some blanching lesions along right side of the neck but mostly  nonblanching and non-itchy.        Labs on Admission: I have personally reviewed following labs and imaging studies  CBC: Recent Labs  Lab 01/16/20 0504  WBC 11.6*  NEUTROABS 9.3*  HGB 13.5  HCT 44.4  MCV 88.8  PLT 247   Basic Metabolic Panel: Recent Labs  Lab 01/16/20 0504  NA 135  K 4.1  CL 93*  CO2 33*  GLUCOSE 358*  BUN 9  CREATININE 0.51  CALCIUM 9.4   GFR: Estimated Creatinine Clearance: 94.8 mL/min (by C-G formula based on SCr of 0.51 mg/dL). Recent Labs  Lab 01/16/20 0504  WBC 11.6*   Liver Function Tests: No results for input(s): AST, ALT, ALKPHOS, BILITOT, PROT, ALBUMIN in the last 168 hours. No results for input(s): LIPASE, AMYLASE in the last 168 hours. No results for input(s): AMMONIA in the last 168 hours. Coagulation Profile: No results for input(s): INR, PROTIME in the last 168 hours. Cardiac Enzymes: No results for input(s): CKTOTAL, CKMB, CKMBINDEX, TROPONINI in the last 168 hours. BNP (last 3 results) No results for input(s): PROBNP in the last 8760 hours. HbA1C: No results for input(s): HGBA1C  in the last 72 hours. CBG: No results for input(s): GLUCAP in the last 168 hours. Lipid Profile: No results for input(s): CHOL, HDL, LDLCALC, TRIG, CHOLHDL, LDLDIRECT in the last 72 hours. Thyroid Function Tests: No results for input(s): TSH, T4TOTAL, FREET4, T3FREE, THYROIDAB in the last 72 hours. Anemia Panel: No results for input(s): VITAMINB12, FOLATE, FERRITIN, TIBC, IRON, RETICCTPCT in the last 72 hours. Urine analysis:    Component Value Date/Time   COLORURINE STRAW (A) 07/04/2017 2051   APPEARANCEUR CLEAR 07/04/2017 2051   LABSPEC 1.033 (H) 07/04/2017 2051   PHURINE 5.0 07/04/2017 2051   GLUCOSEU >=500 (A) 07/04/2017 2051   HGBUR NEGATIVE 07/04/2017 2051   BILIRUBINUR NEGATIVE 07/04/2017 2051   KETONESUR NEGATIVE 07/04/2017 2051   PROTEINUR NEGATIVE 07/04/2017 2051   UROBILINOGEN 0.2 01/07/2015 2128   NITRITE NEGATIVE  07/04/2017 2051   LEUKOCYTESUR NEGATIVE 07/04/2017 2051    Radiological Exams on Admission: Personally reviewed  DG Tibia/Fibula Right  Result Date: 01/16/2020 CLINICAL DATA:  Right lower extremity swelling. EXAM: RIGHT TIBIA AND FIBULA - 2 VIEW COMPARISON:  None. FINDINGS: The right knee and ankle joints are maintained. No acute bony findings or destructive bony changes. IMPRESSION: No acute bony findings. Electronically Signed   By: Rudie Meyer M.D.   On: 01/16/2020 05:39   DG Foot Complete Right  Result Date: 01/16/2020 CLINICAL DATA:  Pain, swelling and redness of the right foot for 1 week. EXAM: RIGHT FOOT COMPLETE - 3+ VIEW COMPARISON:  None. FINDINGS: Marked soft tissue swelling is noted along the lateral aspect of the forefoot. The base of the fifth proximal phalanx is destroyed. Findings consistent with osteomyelitis. No involvement of the fifth MTP joint to suggest septic arthritis. The other bony structures are intact. I do not see any other obvious areas of osteomyelitis. There is diffuse soft tissue swelling/edema suggesting cellulitis. Calcaneal spurring changes are noted. IMPRESSION: 1. Plain film findings consistent with osteomyelitis involving the base of the fifth proximal phalanx. 2. Diffuse soft tissue swelling/edema suggesting cellulitis. Electronically Signed   By: Rudie Meyer M.D.   On: 01/16/2020 05:38   VAS [place] LOWER EXTREMITY VENOUS (DVT) (ONLY [facility] & [facility])  Result Date: 01/15/2020  Lower Venous DVTStudy Indications: Swelling.  Risk Factors: None identified. Limitations: Patient movement, patient pain tolerance. Comparison Study: No prior studies. Performing Technologist: Chanda Busing RVT  Examination Guidelines: A complete evaluation includes B-mode imaging, spectral Doppler, color Doppler, and power Doppler as needed of all accessible portions of each vessel. Bilateral testing is considered an integral part of a complete examination. Limited examinations for reoccurring  indications may be performed as noted. The reflux portion of the exam is performed with the patient in reverse Trendelenburg.  +---------+---------------+---------+-----------+----------+--------------+ RIGHT    CompressibilityPhasicitySpontaneityPropertiesThrombus Aging +---------+---------------+---------+-----------+----------+--------------+ CFV      Full           Yes      Yes                                 +---------+---------------+---------+-----------+----------+--------------+ SFJ      Full                                                        +---------+---------------+---------+-----------+----------+--------------+ FV Prox  Full                                                        +---------+---------------+---------+-----------+----------+--------------+  FV Mid   Full                                                        +---------+---------------+---------+-----------+----------+--------------+ FV DistalFull                                                        +---------+---------------+---------+-----------+----------+--------------+ PFV      Full                                                        +---------+---------------+---------+-----------+----------+--------------+ POP      Full           Yes      Yes                                 +---------+---------------+---------+-----------+----------+--------------+ PTV      Full                                                        +---------+---------------+---------+-----------+----------+--------------+ PERO     Full                                                        +---------+---------------+---------+-----------+----------+--------------+   +----+---------------+---------+-----------+----------+--------------+ LEFTCompressibilityPhasicitySpontaneityPropertiesThrombus Aging +----+---------------+---------+-----------+----------+--------------+ CFV Full            Yes      Yes                                 +----+---------------+---------+-----------+----------+--------------+     Summary: RIGHT: - There is no evidence of deep vein thrombosis in the lower extremity.  - No cystic structure found in the popliteal fossa.  LEFT: - No evidence of common femoral vein obstruction.  *See table(s) above for measurements and observations.    Preliminary     EKG: Not ordered by EDP, will obtain baseline.  Last EKG from 2018 reviewed which showed normal sinus rhythm with QTC 428 ms and nonspecific ST-T changes     Assessment and Plan:   Active Problems:   Osteomyelitis (HCC)    1.  Right foot cellulitis with osteomyelitis: Agree with IV cefepime, vancomycin for now.  Can titrate down antibiotics as clinical picture warrants.    No open wounds currently to culture.  Afebrile.  White count 11 K.  Follow-up blood cultures sent from ED. Orthopedics to follow along.  2.  Uncontrolled diabetes mellitus: On Metformin and 32 units of Lantus at baseline.  Hold Metformin for now in the setting of problem #1.  No evidence of DKA.  Will resume diabetic diet, Lantus, sliding scale insulin and monitor blood glucose with insulin titration as needed.  3.  Abnormal vaginal discharge: Wet prep positive for trichomonas and clue cells.  Received appropriate treatment as outlined above in the ED (Rocephin, azithromycin and Flagyl).  Pelvic exam by EDP not consistent with PID although she does have a prior history of this.  Monitor for any worsening symptoms.  On broad-spectrum antibiotics for problem #1.  4.  Maculopapular truncal rash: Appears mostly to be nonblanching along the anterior trunk.  Discussed with ID Dr. Algis Liming regarding drug rash versus syphilis.  Recommended holding all antibiotics and checking RPR.  Check HIV titers.  Addendum: RPR titer positive, discussed with ID again who recommended benzathine penicillin IM for now and to hold all other  antibiotics.  5.  Drug abuse: Patient denied any history of alcohol or drug abuse to me.  Urine drug screen however positive for cocaine.  Patient too somnolent for counseling.  DVT prophylaxis: Lovenox  COVID screen: Negative  Code Status: Full code  Patient/Family Communication: Discussed with patient and all questions answered to satisfaction.  Consults called: Orthopedics, Dr. Marcello Fennel Admission status :I certify that at the point of admission it is my clinical judgment that the patient will require inpatient hospital care spanning beyond 2 midnights from the point of admission due to high intensity of service and high frequency of surveillance required.Inpatient status is judged to be reasonable and necessary in order to provide the required intensity of service to ensure the patient's safety. The patient's presenting symptoms, physical exam findings, and initial radiographic and laboratory data in the context of their chronic comorbidities is felt to place them at high risk for further clinical deterioration. The following factors support the patient status of inpatient : Right foot osteomyelitis requiring IV antibiotics     Alessandra Bevels MD [facility] Pager in [place]  If 7PM-7AM, please contact night-coverage [email or web address]   01/16/2020, 8:52 AM

## 2020-01-16 NOTE — Progress Notes (Signed)
Pharmacy Antibiotic Note  Bianca Yang is a 44 y.o. female admitted on 01/15/2020 with wound infection/osteomyelitis .  Pharmacy has been consulted for Vancomycin/Zosyn dosing. WBC mildly elevated. Renal function good.   Plan: Vancomycin 1500 mg IV x 1, then 1000 mg IV q12h Zosyn 3.375G IV q8h to be infused over 4 hours Trend WBC, temp, renal function  F/U infectious work-up Drug levels as indicated  ADDENDUM:  Pharmacy consulted to transition from Zosyn to cefepime. No doses of Zosyn given yet.  Plan: D/c Zosyn >> Start cefepime 2g IV q8h Monitor clinical progress, c/s, renal function F/u de-escalation plan/LOT, vancomycin levels as indicated    Height: 5\' 7"  (170.2 cm) Weight: 74.8 kg (165 lb) IBW/kg (Calculated) : 61.6  Temp (24hrs), Avg:98.3 F (36.8 C), Min:97.9 F (36.6 C), Max:98.8 F (37.1 C)  Recent Labs  Lab 01/16/20 0504  WBC 11.6*  CREATININE 0.51    Estimated Creatinine Clearance: 94.8 mL/min (by C-G formula based on SCr of 0.51 mg/dL).    Allergies  Allergen Reactions  . Sulfa Antibiotics Other (See Comments)    Unknown/ childhood allergy.    01/18/20, PharmD, BCPS Please check AMION for all [facility] Pharmacy contact numbers Clinical Pharmacist 01/16/2020 8:01 AM

## 2020-01-17 DIAGNOSIS — M86171 Other acute osteomyelitis, right ankle and foot: Secondary | ICD-10-CM

## 2020-01-17 DIAGNOSIS — N760 Acute vaginitis: Secondary | ICD-10-CM

## 2020-01-17 DIAGNOSIS — A5149 Other secondary syphilitic conditions: Secondary | ICD-10-CM | POA: Diagnosis present

## 2020-01-17 DIAGNOSIS — L03119 Cellulitis of unspecified part of limb: Secondary | ICD-10-CM

## 2020-01-17 DIAGNOSIS — B9689 Other specified bacterial agents as the cause of diseases classified elsewhere: Secondary | ICD-10-CM

## 2020-01-17 DIAGNOSIS — A5901 Trichomonal vulvovaginitis: Secondary | ICD-10-CM | POA: Diagnosis present

## 2020-01-17 DIAGNOSIS — F1410 Cocaine abuse, uncomplicated: Secondary | ICD-10-CM | POA: Diagnosis present

## 2020-01-17 DIAGNOSIS — E663 Overweight: Secondary | ICD-10-CM | POA: Diagnosis present

## 2020-01-17 DIAGNOSIS — E11628 Type 2 diabetes mellitus with other skin complications: Secondary | ICD-10-CM

## 2020-01-17 LAB — CBC
HCT: 43.6 % (ref 36.0–46.0)
Hemoglobin: 12.9 g/dL (ref 12.0–15.0)
MCH: 26.1 pg (ref 26.0–34.0)
MCHC: 29.6 g/dL — ABNORMAL LOW (ref 30.0–36.0)
MCV: 88.1 fL (ref 80.0–100.0)
Platelets: 237 10*3/uL (ref 150–400)
RBC: 4.95 MIL/uL (ref 3.87–5.11)
RDW: 12.8 % (ref 11.5–15.5)
WBC: 11.5 10*3/uL — ABNORMAL HIGH (ref 4.0–10.5)
nRBC: 0 % (ref 0.0–0.2)

## 2020-01-17 LAB — GLUCOSE, CAPILLARY
Glucose-Capillary: 326 mg/dL — ABNORMAL HIGH (ref 70–99)
Glucose-Capillary: 477 mg/dL — ABNORMAL HIGH (ref 70–99)
Glucose-Capillary: 142 mg/dL — ABNORMAL HIGH (ref 70–99)
Glucose-Capillary: 368 mg/dL — ABNORMAL HIGH (ref 70–99)

## 2020-01-17 LAB — BASIC METABOLIC PANEL
Anion gap: 6 (ref 5–15)
BUN: 18 mg/dL (ref 6–20)
CO2: 38 mmol/L — ABNORMAL HIGH (ref 22–32)
Calcium: 9.3 mg/dL (ref 8.9–10.3)
Chloride: 94 mmol/L — ABNORMAL LOW (ref 98–111)
Creatinine, Ser: 0.63 mg/dL (ref 0.44–1.00)
GFR calc Af Amer: >60 mL/min (ref >60)
GFR calc non Af Amer: >60 mL/min (ref >60)
Glucose, Bld: 256 mg/dL — ABNORMAL HIGH (ref 70–99)
Potassium: 4.2 mmol/L (ref 3.5–5.1)
Sodium: 138 mmol/L (ref 135–145)

## 2020-01-17 LAB — T.PALLIDUM AB, TOTAL: T Pallidum Abs: REACTIVE — AB

## 2020-01-17 MED ORDER — METRONIDAZOLE 500 MG PO TABS
500.0000 mg | ORAL_TABLET | Freq: Two times a day (BID) | ORAL | Status: DC
  Administered 2020-01-17 – 2020-01-20 (×7): 500 mg via ORAL
  Filled 2020-01-17 (×7): qty 1

## 2020-01-17 MED ORDER — METRONIDAZOLE 500 MG PO TABS: 500.0000 mg | ORAL_TABLET | Freq: Two times a day (BID) | ORAL | Status: DC

## 2020-01-17 MED ORDER — INSULIN DETEMIR 100 UNIT/ML ~~LOC~~ SOLN
40.0000 [IU] | Freq: Every day | SUBCUTANEOUS | Status: DC
  Administered 2020-01-17: 40 [IU] via SUBCUTANEOUS
  Filled 2020-01-17 (×2): qty 0.4

## 2020-01-17 NOTE — TOC Initial Note (Signed)
Transition of Care [facility]) - Initial/Assessment Note    Patient Details  Name: Bianca Yang MRN: [id number] Date of Birth: Apr 10, 1976  [facility]) CM/SW Contact:    Kingsley Plan, RN Phone Number: 01/17/2020, 1:11 PM  Clinical Narrative:                 Patient active with [facility] and Wellness, will schedule follow up appointment closer to discharge. Patient from home with husband . No DME.     Expected Discharge Plan: Home/Self Care Barriers to Discharge: Continued Medical Work up   Patient Goals and CMS Choice Patient states their goals for this hospitalization and ongoing recovery are:: to return to home CMS Medicare.gov Compare Post Acute Care list provided to:: Patient Choice offered to / list presented to : NA  Expected Discharge Plan and Services Expected Discharge Plan: Home/Self Care   Discharge Planning Services: CM Consult   Living arrangements for the past 2 months: Single Family Home                   DME Agency: NA       HH Arranged: NA          Prior Living Arrangements/Services Living arrangements for the past 2 months: Single Family Home Lives with:: Spouse Patient language and need for interpreter reviewed:: Yes Do you feel safe going back to the place where you live?: Yes      Need for Family Participation in Patient Care: Yes (Comment) Care giver support system in place?: Yes (comment)   Criminal Activity/Legal Involvement Pertinent to Current Situation/Hospitalization: No - Comment as needed  Activities of Daily Living      Permission Sought/Granted   Permission granted to share information with : No              Emotional Assessment   Attitude/Demeanor/Rapport: Engaged Affect (typically observed): Accepting Orientation: : Oriented to Situation, Oriented to  Time, Oriented to Self Alcohol / Substance Use: Not Applicable Psych Involvement: No (comment)  Admission diagnosis:  Trichomonas  vaginitis [A59.01] Osteomyelitis (HCC) [M86.9] Osteomyelitis of right foot, unspecified type [facility]) [M86.9] Patient Active Problem List   Diagnosis Date Noted  . Osteomyelitis (HCC) 01/16/2020  . Cellulitis 11/05/2016  . 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 Intractable vomiting   . Diabetes mellitus type 2 in obese (HCC) 01/14/2016  . Hypoxia 01/14/2016  . Tubo-ovarian abscess 01/13/2016  . CONTACT DERMATITIS 12/04/2008   PCP:  Hoy Register, MD Pharmacy:   [facility] [place], [place] - [address] [place] [address] Phone: [phone] Fax: [phone]  [facility] [address] [place]), [place] - [address] [place] ([place]) [place] [address] Phone: [phone] Fax: [phone]  [facility] - [place], [place] - [address] [place] [address] Phone: [phone] Fax: [phone]  [facility] - [place], [place] - [address] [facility] [address] [place] [address] Phone: [phone] Fax: [phone]     Social Determinants of Health (SDOH) Interventions    Readmission Risk Interventions No flowsheet data found.

## 2020-01-17 NOTE — Consult Note (Signed)
[facility]    Date of Admission:  01/15/2020     Total days of antibiotics 2 metronidazole          Reason for Consult: HIV prevention, osteomyelitis     Referring Provider: Rito Ehrlich  Primary Care Provider: Hoy Register, MD    Assessment: Bianca Yang is a 44 y.o. female with osteomyelitis and cellulitis to the right great toe.  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 She believes she gave it to her current husband and contracted it from a previous partner < 12 months ago.    Will make sure her metronidazole is scheduled for 7d course to treat concurrent bacterial vaginosis.   HIV negative currently but high risk association with syphilis. Attempted discussion regarding PrEP for HIV prevention however she is not interested.   Recommend continued hold off on any further antibiotics until her decision re: surgery. Happy to help with attempt for foot salvage for her with prolonged IV antibiotics should she decide against amputation.   The fact that her ankle is so tender is bothersome with STI history - may need to consider gonococcal infection - while cervicovaginal / urine was clear it can often be found at other sites (oral/rectal).    Plan: 1. Continue antibiotic hold for her pending decision re: surgery  2. Syphilis has been adequately treated - would ask PCP to follow up RPR in 6-12 months for therapeutic monitoring.  3. Continue metronidazole 500 mg BID x 7d total  4. May need to check oral/rectal swabs for gonorrhea given ankle pain    Active Problems:   Osteomyelitis (HCC)   Trichomonas vaginitis   Secondary syphilis   Bacterial vaginosis   . enoxaparin (LOVENOX) injection  40 mg Subcutaneous Q24H  . insulin aspart  0-15 Units Subcutaneous TID WC  . insulin detemir  40 Units Subcutaneous QHS  . metroNIDAZOLE  500 mg Oral Q12H    HPI: Bianca Yang is a 44 y.o. female here with  2 week history of pain to right foot and ankle that made it impossible for her to walk. She had cellulitis on presentation with imaging indicating osteomyelitis.   She does have a PMHx diabetes type 2 for which she is not on her prescribed medications for.   Her foot and toe is still very painful. She is still considering options for toe amputation that was recommended. She is not on antibiotics currently but states the few doses of what she did receive seemed to help the redness.  Tender around her lateral ankle as well.   She is most preoccupied with her "lady parts" and how she can get the drainage to stop. She received one dose of flagyl 2gm and wondering if she is going to get more.   She has a husband currently - have been together < 12 months now. Prior to this relationship she was involved with multiple sexual partners. She has had trichomonas in the past as well as BV. Never syphilis - this is her first time. Noticed a faint red lacy flat non itchy rash overlying her chest recently and RPR was found to be positive with a titer of 1:32. She has received 1 dose 2.4 million units bicillin for treatment.   Her husband was recently treated for syphilis as well during a care encounter he had recently. She states he is her only sexual partner at present and  she believes she is his only partner. She has never injected drugs but did use cocaine and heavy alcohol > 20 years ago.    Review of Systems: Review of Systems  Constitutional: Negative for chills, fever, malaise/fatigue and weight loss.  Respiratory: Negative for cough and sputum production.   Cardiovascular: Negative for chest pain and leg swelling.  Gastrointestinal: Negative for abdominal pain, diarrhea and vomiting.  Genitourinary: Negative for dysuria and flank pain.       Vaginal itching and discharge.   Musculoskeletal: Negative for joint pain, myalgias and neck pain.  Skin: Positive for rash. Negative for itching.    Neurological: Negative for dizziness, tingling and headaches.  Psychiatric/Behavioral: Negative for depression and substance abuse.    Past Medical History:  Diagnosis Date  . Diabetes mellitus without complication (HCC)     Social History   Tobacco Use  . Smoking status: Current Every Day Smoker    Packs/day: 0.50    Types: Cigarettes  . Smokeless tobacco: Never Used  Substance Use Topics  . Alcohol use: Yes    Comment: socially  . Drug use: No    Family History  Problem Relation Age of Onset  . Diabetes Mother    Allergies  Allergen Reactions  . Sulfa Antibiotics Other (See Comments)    Unknown/ childhood allergy.     OBJECTIVE: Blood pressure (!) 112/94, pulse 92, temperature 98.6 F (37 C), temperature source Oral, resp. rate 17, height 5\' 7"  (1.702 m), weight 74.8 kg, SpO2 94 %.  Physical Exam HENT:     Mouth/Throat:     Mouth: Mucous membranes are moist.  Eyes:     General: No scleral icterus. Cardiovascular:     Rate and Rhythm: Normal rate and regular rhythm.  Pulmonary:     Effort: Pulmonary effort is normal. No respiratory distress.  Abdominal:     General: Bowel sounds are normal. There is no distension.     Palpations: Abdomen is soft.  Musculoskeletal:     Comments: Plantar aspect of right great toe with dry ulcer and surrounding callous. Some mild pink/red streaking up to lateral aspect of right ankle under malleolus. No swelling. Tender with any manipulation   Skin:    General: Skin is warm and dry.     Capillary Refill: Capillary refill takes less than 2 seconds.     Findings: Rash present.     Comments: Faint macular pink rash overlying chest / breasts.   Neurological:     Mental Status: She is alert and oriented to person, place, and time.     Lab Results Lab Results  Component Value Date   WBC 11.5 (H) 01/17/2020   HGB 12.9 01/17/2020   HCT 43.6 01/17/2020   MCV 88.1 01/17/2020   PLT 237 01/17/2020    Lab Results  Component  Value Date   CREATININE 0.63 01/17/2020   BUN 18 01/17/2020   NA 138 01/17/2020   K 4.2 01/17/2020   CL 94 (L) 01/17/2020   CO2 38 (H) 01/17/2020    Lab Results  Component Value Date   ALT 15 07/04/2017   AST 19 07/04/2017   ALKPHOS 98 07/04/2017   BILITOT 0.7 07/04/2017     Microbiology: Recent Results (from the past 240 hour(s))  Wet prep, genital     Status: Abnormal   Collection Time: 01/16/20  5:33 AM   Specimen: PATH Cytology Cervicovaginal Ancillary Only  Result Value Ref Range Status   Yeast Wet Prep HPF  POC NONE SEEN NONE SEEN Final   Trich, Wet Prep PRESENT (A) NONE SEEN Final   Clue Cells Wet Prep HPF POC PRESENT (A) NONE SEEN Final   WBC, Wet Prep HPF POC MANY (A) NONE SEEN Final   Sperm NONE SEEN  Final    Comment: Performed at [facility] Lab, [address] [place], [place] [address]  SARS Coronavirus 2 by RT PCR (hospital order, performed in [facility] hospital lab) Nasopharyngeal Nasopharyngeal Swab     Status: None   Collection Time: 01/16/20  6:37 AM   Specimen: Nasopharyngeal Swab  Result Value Ref Range Status   SARS Coronavirus 2 NEGATIVE NEGATIVE Final    Comment: (NOTE) SARS-CoV-2 target nucleic acids are NOT DETECTED.  The SARS-CoV-2 RNA is generally detectable in upper and lower respiratory specimens during the acute phase of infection. The lowest concentration of SARS-CoV-2 viral copies this assay can detect is 250 copies / mL. A negative result does not preclude SARS-CoV-2 infection and should not be used as the sole basis for treatment or other patient management decisions.  A negative result may occur with improper specimen collection / handling, submission of specimen other than nasopharyngeal swab, presence of viral mutation(s) within the areas targeted by this assay, and inadequate number of viral copies (<250 copies / mL). A negative result must be combined with clinical observations, patient history, and epidemiological  information.  Fact Sheet for Patients:   BoilerBrush.com.cy  Fact Sheet for Healthcare Providers: https://pope.com/  This test is not yet approved or  cleared by the [place] FDA and has been authorized for detection and/or diagnosis of SARS-CoV-2 by FDA under an Emergency Use Authorization (EUA).  This EUA will remain in effect (meaning this test can be used) for the duration of the COVID-19 declaration under Section 564(b)(1) of the Act, 21 U.S.C. section 360bbb-3(b)(1), unless the authorization is terminated or revoked sooner.  Performed at [facility] Lab, [address], [place], [place] [address]   Culture, blood (routine x 2)     Status: None (Preliminary result)   Collection Time: 01/16/20  6:37 AM   Specimen: BLOOD LEFT ARM  Result Value Ref Range Status   Specimen Description BLOOD LEFT ARM  Final   Special Requests   Final    BOTTLES DRAWN AEROBIC AND ANAEROBIC Blood Culture results may not be optimal due to an inadequate volume of blood received in culture bottles   Culture   Final    NO GROWTH < 24 HOURS Performed at [facility], [address], [place], [place] [address]    Report Status PENDING  Incomplete  Culture, blood (routine x 2)     Status: None (Preliminary result)   Collection Time: 01/16/20  6:37 AM   Specimen: BLOOD LEFT HAND  Result Value Ref Range Status   Specimen Description BLOOD LEFT HAND  Final   Special Requests   Final    BOTTLES DRAWN AEROBIC AND ANAEROBIC Blood Culture adequate volume   Culture   Final    NO GROWTH < 24 HOURS Performed at [facility], [address] [place], [place] [address]    Report Status PENDING  Incomplete    Rexene Alberts, MSN, NP-C [facility]  [email or web address] Pager: [phone] Office: [phone] [facility] Main Line: [phone]

## 2020-01-17 NOTE — Progress Notes (Signed)
Inpatient Diabetes Program Recommendations  AACE/ADA: New Consensus Statement on Inpatient Glycemic Control (2015)  Target Ranges:  Prepandial:   less than 140 mg/dL      Peak postprandial:   less than 180 mg/dL (1-2 hours)      Critically ill patients:  140 - 180 mg/dL   Lab Results  Component Value Date   GLUCAP 326 (H) 01/17/2020   HGBA1C 12.0 (H) 01/16/2020    Review of Glycemic Control Results for YECENIA, DALGLEISH (MRN [id number]) as of 01/17/2020 09:56  Ref. Range 01/16/2020 16:48 01/16/2020 21:41 01/17/2020 08:05  Glucose-Capillary Latest Ref Range: 70 - 99 mg/dL 845 (H) 364 (H) 680 (H)   Diabetes history: DM 2 Outpatient Diabetes medications: Levemir and metformin in past unable to get according to pt Current orders for Inpatient glycemic control:  Levemir 30 units qhs Novolog 0-15 units tid  Inpatient Diabetes Program Recommendations:    -  Consider increasing Levemir to 40 units.  Spoke with pt at bedside on 7/20. Refer to note. Pt would benefit from appt being made at the [facility] (has been in the past) and there she can get her insulin, metformin, and meter needed.  Thanks,  Christena Deem RN, MSN, BC-ADM Inpatient Diabetes Coordinator Team Pager [phone] (8a-5p)

## 2020-01-17 NOTE — Progress Notes (Signed)
PROGRESS NOTE  Bianca Yang GNF:[id number] DOB: Sep 17, 1975 DOA: 01/15/2020 PCP: Hoy Register, MD  HPI/Recap of past 43 hours: 44 year old female past medical history uncontrolled diabetes mellitus, previous STDs and PID admitted on 7/24 right foot cellulitis/osteomyelitis to the hospitalist service.  At time of admission, was also complaining of vaginal discharge and pelvic exam found her to have BV and trichomonas.  She was also noted to have a rash on her trunk concerning for syphilis and when RPR was checked, found to be positive.  Patient given concentrated dose of Flagyl and IV penicillin and admitted to the hospitalist service.  She was seen by orthopedic surgery, but declined surgical option when it was discussed about likely amputation.  No events overnight.  Today, patient has had some time to think about it.  We discussed her STDs as well as her osteomyelitis.  She understands that there is little chance of her wound healing without amputation and if left to antibiotics alone, has large chance of becoming septic or spreading which will lead to further amputation.  She complains of continued vaginal discharge.  Assessment/Plan: Active Problems:   Uncontrolled diabetes mellitus with skin complications, with long-term current use of insulin: A1c elevated.  Continue Lantus and sliding scale.  Appreciate diabetes coordinator assistance. [facility])   Cellulitis in diabetic foot (HCC) with secondary osteomyelitis: Infectious disease following.  I appreciate their help.  While she makes up her mind about whether or not she wants to have surgery, holding antibiotics at this time.  Syphilis felt to been adequately treated.  Recommend PCP to follow-up RPR in 6 to 12 months.  Continue Flagyl 500 twice daily x7 days.  Possible oral/rectal swabs for gonorrhea given ankle pain.      Trichomonas vaginitis/bacterial vaginosis: On Flagyl.    Secondary syphilis: Received concentrated dose of IV  penicillin x1.    Overweight (BMI 25.0-29.9): Meets criteria with BMI greater than 25.    Cocaine abuse [facility]): Counseled patient although she herself denies drug use.   Code Status: Full code  Family Communication: Husband to come by later.  Disposition Plan: Depending on if patient want surgery   Consultants:  Infectious disease  Procedures:  None  Antimicrobials:  IV penicillin x1 concentrated dose 7/28  Flagyl 7/20-present  DVT prophylaxis: Lovenox   Objective: Vitals:   01/17/20 0454 01/17/20 1342  BP: 102/68 (!) 112/94  Pulse: 87 92  Resp: 14 17  Temp: 99.2 F (37.3 C) 98.6 F (37 C)  SpO2: 99% 94%    Intake/Output Summary (Last 24 hours) at 01/17/2020 1959 Last data filed at 01/17/2020 1700 Gross per 24 hour  Intake 1235 ml  Output 700 ml  Net 535 ml   Filed Weights   01/15/20 1826  Weight: 74.8 kg   Body mass index is 25.84 kg/m.  Exam:   General: Alert and oriented x3, no acute distress  Cardiovascular: Regular rate and rhythm, S1-S2  Respiratory: Clear to auscultation bilaterally  Abdomen: Soft, nontender, nondistended, positive bowel sounds  Musculoskeletal: No clubbing or cyanosis, trace pitting edema  Skin: Right great toe with nondraining dry ulceration and some mild surrounding erythema streaks.  Tender.  Psychiatry: Appropriate, no evidence of psychoses   Data Reviewed: CBC: Recent Labs  Lab 01/16/20 0504 01/17/20 0218  WBC 11.6* 11.5*  NEUTROABS 9.3*  --   HGB 13.5 12.9  HCT 44.4 43.6  MCV 88.8 88.1  PLT 247 237   Basic Metabolic Panel: Recent Labs  Lab 01/16/20 0504  01/17/20 0218  NA 135 138  K 4.1 4.2  CL 93* 94*  CO2 33* 38*  GLUCOSE 358* 256*  BUN 9 18  CREATININE 0.51 0.63  CALCIUM 9.4 9.3   GFR: Estimated Creatinine Clearance: 94.8 mL/min (by C-G formula based on SCr of 0.63 mg/dL). Liver Function Tests: No results for input(s): AST, ALT, ALKPHOS, BILITOT, PROT, ALBUMIN in the last 168  hours. No results for input(s): LIPASE, AMYLASE in the last 168 hours. No results for input(s): AMMONIA in the last 168 hours. Coagulation Profile: No results for input(s): INR, PROTIME in the last 168 hours. Cardiac Enzymes: No results for input(s): CKTOTAL, CKMB, CKMBINDEX, TROPONINI in the last 168 hours. BNP (last 3 results) No results for input(s): PROBNP in the last 8760 hours. HbA1C: Recent Labs    01/16/20 0610  HGBA1C 12.0*   CBG: Recent Labs  Lab 01/16/20 1648 01/16/20 2141 01/17/20 0805 01/17/20 1246 01/17/20 1649  GLUCAP 262* 231* 326* 477* 142*   Lipid Profile: No results for input(s): CHOL, HDL, LDLCALC, TRIG, CHOLHDL, LDLDIRECT in the last 72 hours. Thyroid Function Tests: No results for input(s): TSH, T4TOTAL, FREET4, T3FREE, THYROIDAB in the last 72 hours. Anemia Panel: No results for input(s): VITAMINB12, FOLATE, FERRITIN, TIBC, IRON, RETICCTPCT in the last 72 hours. Urine analysis:    Component Value Date/Time   COLORURINE YELLOW 01/16/2020 1050   APPEARANCEUR CLOUDY (A) 01/16/2020 1050   LABSPEC 1.025 01/16/2020 1050   PHURINE 5.0 01/16/2020 1050   GLUCOSEU >=500 (A) 01/16/2020 1050   HGBUR MODERATE (A) 01/16/2020 1050   BILIRUBINUR NEGATIVE 01/16/2020 1050   KETONESUR 5 (A) 01/16/2020 1050   PROTEINUR NEGATIVE 01/16/2020 1050   UROBILINOGEN 0.2 01/07/2015 2128   NITRITE NEGATIVE 01/16/2020 1050   LEUKOCYTESUR LARGE (A) 01/16/2020 1050   Sepsis Labs: @LABRCNTIP (procalcitonin:4,lacticidven:4)  ) Recent Results (from the past 240 hour(s))  Wet prep, genital     Status: Abnormal   Collection Time: 01/16/20  5:33 AM   Specimen: PATH Cytology Cervicovaginal Ancillary Only  Result Value Ref Range Status   Yeast Wet Prep HPF POC NONE SEEN NONE SEEN Final   Trich, Wet Prep PRESENT (A) NONE SEEN Final   Clue Cells Wet Prep HPF POC PRESENT (A) NONE SEEN Final   WBC, Wet Prep HPF POC MANY (A) NONE SEEN Final   Sperm NONE SEEN  Final    Comment:  Performed at [facility] Lab, [address], [place], [place]  SARS Coronavirus 2 by RT PCR (hospital order, performed in [facility] hospital lab) Nasopharyngeal Nasopharyngeal Swab     Status: None   Collection Time: 01/16/20  6:37 AM   Specimen: Nasopharyngeal Swab  Result Value Ref Range Status   SARS Coronavirus 2 NEGATIVE NEGATIVE Final    Comment: (NOTE) SARS-CoV-2 target nucleic acids are NOT DETECTED.  The SARS-CoV-2 RNA is generally detectable in upper and lower respiratory specimens during the acute phase of infection. The lowest concentration of SARS-CoV-2 viral copies this assay can detect is 250 copies / mL. A negative result does not preclude SARS-CoV-2 infection and should not be used as the sole basis for treatment or other patient management decisions.  A negative result may occur with improper specimen collection / handling, submission of specimen other than nasopharyngeal swab, presence of viral mutation(s) within the areas targeted by this assay, and inadequate number of viral copies (<250 copies / mL). A negative result must be combined with clinical observations, patient history, and epidemiological information.  Fact Sheet for Patients:   BoilerBrush.com.cy  Fact Sheet for Healthcare Providers: https://pope.com/  This test is not yet approved or  cleared by the [place] FDA and has been authorized for detection and/or diagnosis of SARS-CoV-2 by FDA under an Emergency Use Authorization (EUA).  This EUA will remain in effect (meaning this test can be used) for the duration of the COVID-19 declaration under Section 564(b)(1) of the Act, 21 U.S.C. section 360bbb-3(b)(1), unless the authorization is terminated or revoked sooner.  Performed at [facility], [address] [place], [place] [address]   Culture, blood (routine x 2)     Status: None (Preliminary result)   Collection Time:  01/16/20  6:37 AM   Specimen: BLOOD LEFT ARM  Result Value Ref Range Status   Specimen Description BLOOD LEFT ARM  Final   Special Requests   Final    BOTTLES DRAWN AEROBIC AND ANAEROBIC Blood Culture results may not be optimal due to an inadequate volume of blood received in culture bottles   Culture   Final    NO GROWTH < 24 HOURS Performed at [facility], [address] [place], [place] [address]    Report Status PENDING  Incomplete  Culture, blood (routine x 2)     Status: None (Preliminary result)   Collection Time: 01/16/20  6:37 AM   Specimen: BLOOD LEFT HAND  Result Value Ref Range Status   Specimen Description BLOOD LEFT HAND  Final   Special Requests   Final    BOTTLES DRAWN AEROBIC AND ANAEROBIC Blood Culture adequate volume   Culture   Final    NO GROWTH < 24 HOURS Performed at [facility], [address], [place], [place] [address]    Report Status PENDING  Incomplete      Studies: No results found.  Scheduled Meds: . enoxaparin (LOVENOX) injection  40 mg Subcutaneous Q24H  . insulin aspart  0-15 Units Subcutaneous TID WC  . insulin detemir  40 Units Subcutaneous QHS  . metroNIDAZOLE  500 mg Oral Q12H    Continuous Infusions:   LOS: 1 day     Hollice Espy, MD [facility]   01/17/2020, 7:59 PM

## 2020-01-18 ENCOUNTER — Inpatient Hospital Stay (HOSPITAL_COMMUNITY): Payer: Self-pay

## 2020-01-18 ENCOUNTER — Telehealth (HOSPITAL_COMMUNITY): Payer: Self-pay

## 2020-01-18 DIAGNOSIS — E1165 Type 2 diabetes mellitus with hyperglycemia: Secondary | ICD-10-CM

## 2020-01-18 LAB — GLUCOSE, CAPILLARY
Glucose-Capillary: 308 mg/dL — ABNORMAL HIGH (ref 70–99)
Glucose-Capillary: 443 mg/dL — ABNORMAL HIGH (ref 70–99)
Glucose-Capillary: 356 mg/dL — ABNORMAL HIGH (ref 70–99)
Glucose-Capillary: 430 mg/dL — ABNORMAL HIGH (ref 70–99)

## 2020-01-18 MED ORDER — LORAZEPAM 2 MG/ML IJ SOLN
INTRAMUSCULAR | Status: AC
  Administered 2020-01-18: 1 mg via INTRAVENOUS
  Filled 2020-01-18: qty 1

## 2020-01-18 MED ORDER — INSULIN ASPART 100 UNIT/ML ~~LOC~~ SOLN
10.0000 [IU] | Freq: Three times a day (TID) | SUBCUTANEOUS | Status: DC
  Administered 2020-01-18 – 2020-01-20 (×7): 10 [IU] via SUBCUTANEOUS

## 2020-01-18 MED ORDER — INSULIN DETEMIR 100 UNIT/ML ~~LOC~~ SOLN
50.0000 [IU] | Freq: Every day | SUBCUTANEOUS | Status: DC
  Administered 2020-01-18 – 2020-01-19 (×2): 50 [IU] via SUBCUTANEOUS
  Filled 2020-01-18 (×3): qty 0.5

## 2020-01-18 MED ORDER — LORAZEPAM 2 MG/ML IJ SOLN: 1.0000 mg | Freq: Once | INTRAMUSCULAR | Status: AC

## 2020-01-18 NOTE — Progress Notes (Signed)
[facility]  PROGRESS NOTE  Bianca Yang EXB:[id number] DOB: [birth year] DOA: 01/15/2020 PCP: Hoy Register, MD   Brief HPI:   44 year old female with medical history of diabetes mellitus type 2, STDs, PID was admitted on 01/20/2020 with right foot cellulitis/osteomyelitis of fifth metatarsal.  At time of admission she complained of vaginal discharge and pelvic exam was done which showed that she had bacterial vaginosis and trichomonas.  Patient was started on Flagyl 500 mg p.o. twice daily for 7 days.  She was also noted to have rash on her trunk concerning for syphilis and RPR was found to be positive.  She was1 dose of IM penicillin.   Subjective   Patient seen and examined, denies any complaints.   Assessment/Plan:     1. Osteomyelitis of fifth proximal phalanx-confirmed on MRI foot.  No abnormality noted on MRI ankle.  No abscess.  Will await orthopedics recommendation.  IV antibiotics as per ID. 2. Diabetes mellitus type 2-blood glucose uncontrolled, will increase insulin detemir to 50 units subcu daily, add meal coverage 10 units insulin aspart 3 times daily, sliding scale insulin NovoLog.  Follow CBG before every meal and at bedtime. 3. 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  ID to follow as outpatient. 4. Trichomonas vaginitis/bacterial vaginosis-continue Flagyl 5 mg p.o. twice daily for total 7 days.  Patient agrees for oral swab for gonorrhea. 5. Overweight-BMI greater than 25.   Scheduled medications:   . enoxaparin (LOVENOX) injection  40 mg Subcutaneous Q24H  . insulin aspart  0-15 Units Subcutaneous TID WC  . insulin aspart  10 Units Subcutaneous TID WC  . insulin detemir  50 Units Subcutaneous QHS  . LORazepam      . LORazepam  1 mg Intravenous Once  . metroNIDAZOLE  500 mg Oral Q12H      CBG: Recent Labs  Lab 01/17/20 1649 01/17/20 2122 01/18/20 0825 01/18/20 1137 01/18/20 1640  GLUCAP 142*  368* 308* 443* 356*    SpO2: 95 % O2 Flow Rate (L/min): 2 L/min    CBC: Recent Labs  Lab 01/16/20 0504 01/17/20 0218  WBC 11.6* 11.5*  NEUTROABS 9.3*  --   HGB 13.5 12.9  HCT 44.4 43.6  MCV 88.8 88.1  PLT 247 237    Basic Metabolic Panel: Recent Labs  Lab 01/16/20 0504 01/17/20 0218  NA 135 138  K 4.1 4.2  CL 93* 94*  CO2 33* 38*  GLUCOSE 358* 256*  BUN 9 18  CREATININE 0.51 0.63  CALCIUM 9.4 9.3      Antibiotics: Anti-infectives (From admission, onward)   Start     Dose/Rate Route Frequency Ordered Stop   01/17/20 1400  metroNIDAZOLE (FLAGYL) tablet 500 mg  Status:  Discontinued        500 mg Oral Every 12 hours 01/17/20 1356 01/17/20 1356   01/17/20 1400  metroNIDAZOLE (FLAGYL) tablet 500 mg     Discontinue     500 mg Oral Every 12 hours 01/17/20 1356 01/23/20 0959   01/16/20 2000  vancomycin (VANCOCIN) IVPB 1000 mg/200 mL premix  Status:  Discontinued        1,000 mg 200 mL/hr over 60 Minutes Intravenous Every 12 hours 01/16/20 0655 01/16/20 1357   01/16/20 1400  penicillin g benzathine (BICILLIN LA) 1200000 UNIT/2ML injection 2.4 Million Units        2.4 Million Units Intramuscular  Once 01/16/20 1356 01/16/20 1657   01/16/20 0800  ceFEPIme (MAXIPIME) 2  g in sodium chloride 0.9 % 100 mL IVPB  Status:  Discontinued        2 g 200 mL/hr over 30 Minutes Intravenous Every 8 hours 01/16/20 0759 01/16/20 1356   01/16/20 0700  cefTRIAXone (ROCEPHIN) injection 250 mg  Status:  Discontinued        250 mg Intramuscular  Once 01/16/20 0646 01/16/20 0646   01/16/20 0700  azithromycin (ZITHROMAX) tablet 1,000 mg        1,000 mg Oral  Once 01/16/20 0646 01/16/20 0827   01/16/20 0700  metroNIDAZOLE (FLAGYL) tablet 2,000 mg        2,000 mg Oral  Once 01/16/20 0646 01/16/20 0825   01/16/20 0700  cefTRIAXone (ROCEPHIN) injection 500 mg        500 mg Intramuscular  Once 01/16/20 0646 01/16/20 0828   01/16/20 0645  vancomycin (VANCOREADY) IVPB 1500 mg/300 mL        1,500  mg 150 mL/hr over 120 Minutes Intravenous  Once 01/16/20 4098 01/16/20 0957   01/16/20 0645  piperacillin-tazobactam (ZOSYN) IVPB 3.375 g  Status:  Discontinued        3.375 g 12.5 mL/hr over 240 Minutes Intravenous Every 8 hours 01/16/20 1191 01/16/20 0759   01/16/20 0600  cefTRIAXone (ROCEPHIN) injection 500 mg  Status:  Discontinued        500 mg Intramuscular  Once 01/16/20 0548 01/16/20 0607   01/16/20 0600  azithromycin (ZITHROMAX) tablet 1,000 mg  Status:  Discontinued        1,000 mg Oral  Once 01/16/20 0548 01/16/20 0607   01/16/20 0600  metroNIDAZOLE (FLAGYL) tablet 2,000 mg  Status:  Discontinued        2,000 mg Oral  Once 01/16/20 0548 01/16/20 0607       DVT prophylaxis: Lovenox  Code Status: Full code  Family Communication: No family at bedside    Status is: Inpatient  Dispo: The patient is from: Home              Anticipated d/c is to: Home              Anticipated d/c date is: 01/21/2020              Patient currently undergoing evaluation and treatment for right fifth metatarsal osteomyelitis  Barrier to discharge-evaluation for right fifth metatarsal osteomyelitis,      Consultants:  Orthopedics  Infectious disease  Procedures:  None   Objective   Vitals:   01/17/20 1342 01/17/20 2120 01/18/20 0527 01/18/20 1456  BP: (!) 112/94 110/66 106/73 (!) 132/82  Pulse: 92 87 82 88  Resp: 17 17 17 18   Temp: 98.6 F (37 C) 98.7 F (37.1 C) 98 F (36.7 C) 98 F (36.7 C)  TempSrc: Oral Oral Oral Oral  SpO2: 94% 90% 100% 95%  Weight:      Height:        Intake/Output Summary (Last 24 hours) at 01/18/2020 1712 Last data filed at 01/18/2020 0900 Gross per 24 hour  Intake 480 ml  Output --  Net 480 ml    07/20 1901 - 07/22 0700 In: 1235 [P.O.:1235] Out: 700 [Urine:700]  Filed Weights   01/15/20 1826  Weight: 74.8 kg    Physical Examination:    General: Appears in no acute distress  Cardiovascular: S1-S2, regular, no murmur  auscultated  Respiratory: Clear to auscultation bilaterally, no wheezing or crackles auscultated  Abdomen: Abdomen is soft, nontender, no organomegaly  Extremities: Dusky erythema  noted in the right foot at fifth toe, mild tenderness to palpation  Neurologic: Alert, oriented x3, intact insight and judgment    Data Reviewed:   Recent Results (from the past 240 hour(Yang))  Wet prep, genital     Status: Abnormal   Collection Time: 01/16/20  5:33 AM   Specimen: PATH Cytology Cervicovaginal Ancillary Only  Result Value Ref Range Status   Yeast Wet Prep HPF POC NONE SEEN NONE SEEN Final   Trich, Wet Prep PRESENT (A) NONE SEEN Final   Clue Cells Wet Prep HPF POC PRESENT (A) NONE SEEN Final   WBC, Wet Prep HPF POC MANY (A) NONE SEEN Final   Sperm NONE SEEN  Final    Comment: Performed at [facility], [address], [place], [place] [address]  SARS Coronavirus 2 by RT PCR (hospital order, performed in [facility] hospital lab) Nasopharyngeal Nasopharyngeal Swab     Status: None   Collection Time: 01/16/20  6:37 AM   Specimen: Nasopharyngeal Swab  Result Value Ref Range Status   SARS Coronavirus 2 NEGATIVE NEGATIVE Final    Comment: (NOTE) SARS-CoV-2 target nucleic acids are NOT DETECTED.  The SARS-CoV-2 RNA is generally detectable in upper and lower respiratory specimens during the acute phase of infection. The lowest concentration of SARS-CoV-2 viral copies this assay can detect is 250 copies / mL. A negative result does not preclude SARS-CoV-2 infection and should not be used as the sole basis for treatment or other patient management decisions.  A negative result may occur with improper specimen collection / handling, submission of specimen other than nasopharyngeal swab, presence of viral mutation(Yang) within the areas targeted by this assay, and inadequate number of viral copies (<250 copies / mL). A negative result must be combined with clinical observations, patient  history, and epidemiological information.  Fact Sheet for Patients:   BoilerBrush.com.cy  Fact Sheet for Healthcare Providers: https://pope.com/  This test is not yet approved or  cleared by the [place] FDA and has been authorized for detection and/or diagnosis of SARS-CoV-2 by FDA under an Emergency Use Authorization (EUA).  This EUA will remain in effect (meaning this test can be used) for the duration of the COVID-19 declaration under Section 564(b)(1) of the Act, 21 U.[place].C. section 360bbb-3(b)(1), unless the authorization is terminated or revoked sooner.  Performed at [facility], [address], [place], [place] [address]   Culture, blood (routine x 2)     Status: None (Preliminary result)   Collection Time: 01/16/20  6:37 AM   Specimen: BLOOD LEFT ARM  Result Value Ref Range Status   Specimen Description BLOOD LEFT ARM  Final   Special Requests   Final    BOTTLES DRAWN AEROBIC AND ANAEROBIC Blood Culture results may not be optimal due to an inadequate volume of blood received in culture bottles   Culture   Final    NO GROWTH < 24 HOURS Performed at [facility], [address], [place], [place] [address]    Report Status PENDING  Incomplete  Culture, blood (routine x 2)     Status: None (Preliminary result)   Collection Time: 01/16/20  6:37 AM   Specimen: BLOOD LEFT HAND  Result Value Ref Range Status   Specimen Description BLOOD LEFT HAND  Final   Special Requests   Final    BOTTLES DRAWN AEROBIC AND ANAEROBIC Blood Culture adequate volume   Culture   Final    NO GROWTH <  24 HOURS Performed at [facility], [address], [place], [place] [address]    Report Status PENDING  Incomplete     Studies:  MR FOOT RIGHT WO CONTRAST  Result Date: 01/18/2020 CLINICAL DATA:  Right great toe ulcer. Fifth proximal phalanx osteomyelitis on x-ray. EXAM: MRI OF THE RIGHT FOREFOOT WITHOUT CONTRAST; MRI OF  THE RIGHT ANKLE WITHOUT CONTRAST TECHNIQUE: Multiplanar, multisequence MR imaging of the right ankle and foot was performed. No intravenous contrast was administered. COMPARISON:   IMPRESSION: 1. Osteomyelitis of the fifth proximal phalanx.  No abscess. 2. Small plantar great toe ulceration.  No underlying osteomyelitis. 3. No acute abnormality or significant degenerative changes of the ankle. Electronically Signed   By: Obie Dredge M.D.   On: 01/18/2020 16:24   MR ANKLE RIGHT WO CONTRAST  Result Date: 01/18/2020 CLINICAL DATA:  Right great toe ulcer. Fifth proximal phalanx osteomyelitis on x-ray. EXAM: MRI OF THE RIGHT FOREFOOT WITHOUT CONTRAST; MRI OF THE RIGHT ANKLE WITHOUT CONTRAST TECHNIQUE: Multiplanar, multisequence MR imaging of the right ankle and foot was performed. No intravenous contrast was administered. COMPARISON:  Right foot x-rays dated January 16, 2020. FINDINGS:IMPRESSION: 1. Osteomyelitis of the fifth proximal phalanx.  No abscess. 2. Small plantar great toe ulceration.  No underlying osteomyelitis. 3. No acute abnormality or significant degenerative changes of the ankle. Electronically Signed   By: Obie Dredge M.D.   On: 01/18/2020 16:24     Bianca Yang Bianca Yang   [facility] If 7PM-7AM, please contact night-coverage at [email or web address], Office  [phone]   01/18/2020, 5:12 PM  LOS: 2 days

## 2020-01-18 NOTE — Progress Notes (Signed)
[facility]  Date of Admission:  01/15/2020      Total days of antibiotics           ASSESSMENT: Bianca Yang is a 44 y.o. female with changes to right foot on xray that indicates osteomyelitis of the 5th metatarsal, which is clinically not where her wound is - "base of the 5th proximal phalanx is destroyed." Will get MRI of the foot and ankle to better characterize for surgical planning.   Denies any anal sex and refuses anal swab - allowed oral swab to check for pharyngeal gonococcal colonization.   Discussed her vaginal discharge will take some time to clear up but she will need to continue this BID for 7 days.     PLAN: 1. Follow up MRI results  2. Oral gonorrhea swab to be collected  3. Hold antibiotics    Active Problems:   Uncontrolled diabetes mellitus with skin complications, with long-term current use of insulin (HCC)   Cellulitis in diabetic foot (HCC)   Osteomyelitis (HCC)   Trichomonas vaginitis   Secondary syphilis   Bacterial vaginosis   Overweight (BMI 25.0-29.9)   Cocaine abuse (HCC)   . enoxaparin (LOVENOX) injection  40 mg Subcutaneous Q24H  . insulin aspart  0-15 Units Subcutaneous TID WC  . insulin aspart  10 Units Subcutaneous TID WC  . insulin detemir  50 Units Subcutaneous QHS  . LORazepam      . LORazepam  1 mg Intravenous Once  . metroNIDAZOLE  500 mg Oral Q12H    SUBJECTIVE: Ankle pain is a little better today. Still some redness.  Still with vaginal drainage.    Review of Systems: Review of Systems  Constitutional: Negative for chills and fever.  HENT: Negative for tinnitus.   Eyes: Negative for blurred vision and photophobia.  Respiratory: Negative for cough and sputum production.   Cardiovascular: Negative for chest pain.  Gastrointestinal: Negative for diarrhea, nausea and vomiting.  Genitourinary: Negative for dysuria.  Musculoskeletal: Positive for joint pain.  Skin: Negative for  rash.  Neurological: Negative for headaches.    Allergies  Allergen Reactions  . Sulfa Antibiotics Other (See Comments)    Unknown/ childhood allergy.     OBJECTIVE: Vitals:   01/17/20 1342 01/17/20 2120 01/18/20 0527 01/18/20 1456  BP: (!) 112/94 110/66 106/73 (!) 132/82  Pulse: 92 87 82 88  Resp: 17 17 17 18   Temp: 98.6 F (37 C) 98.7 F (37.1 C) 98 F (36.7 C) 98 F (36.7 C)  TempSrc: Oral Oral Oral Oral  SpO2: 94% 90% 100% 95%  Weight:      Height:       Body mass index is 25.84 kg/m.  Physical Exam Constitutional:      Appearance: Normal appearance.  Cardiovascular:     Rate and Rhythm: Normal rate and regular rhythm.  Pulmonary:     Effort: Pulmonary effort is normal.  Musculoskeletal:     Comments: R great toe with dry plantar ulcer. Small non erythematous, non fluctuant nodule to the lateral side of foot. Ankle can be palpated today with less pain. No significant swelling.   Skin:    General: Skin is warm and dry.     Capillary Refill: Capillary refill takes less than 2 seconds.  Neurological:     Mental Status: She is alert and oriented to person, place, and time.     Lab Results Lab Results  Component  Value Date   WBC 11.5 (H) 01/17/2020   HGB 12.9 01/17/2020   HCT 43.6 01/17/2020   MCV 88.1 01/17/2020   PLT 237 01/17/2020    Lab Results  Component Value Date   CREATININE 0.63 01/17/2020   BUN 18 01/17/2020   NA 138 01/17/2020   K 4.2 01/17/2020   CL 94 (L) 01/17/2020   CO2 38 (H) 01/17/2020    Lab Results  Component Value Date   ALT 15 07/04/2017   AST 19 07/04/2017   ALKPHOS 98 07/04/2017   BILITOT 0.7 07/04/2017     Microbiology: Recent Results (from the past 240 hour(s))  Wet prep, genital     Status: Abnormal   Collection Time: 01/16/20  5:33 AM   Specimen: PATH Cytology Cervicovaginal Ancillary Only  Result Value Ref Range Status   Yeast Wet Prep HPF POC NONE SEEN NONE SEEN Final   Trich, Wet Prep PRESENT (A) NONE SEEN  Final   Clue Cells Wet Prep HPF POC PRESENT (A) NONE SEEN Final   WBC, Wet Prep HPF POC MANY (A) NONE SEEN Final   Sperm NONE SEEN  Final    Comment: Performed at [facility], [address] [place], [place] [address]  SARS Coronavirus 2 by RT PCR (hospital order, performed in [facility] hospital lab) Nasopharyngeal Nasopharyngeal Swab     Status: None   Collection Time: 01/16/20  6:37 AM   Specimen: Nasopharyngeal Swab  Result Value Ref Range Status   SARS Coronavirus 2 NEGATIVE NEGATIVE Final    Comment: (NOTE) SARS-CoV-2 target nucleic acids are NOT DETECTED.  The SARS-CoV-2 RNA is generally detectable in upper and lower respiratory specimens during the acute phase of infection. The lowest concentration of SARS-CoV-2 viral copies this assay can detect is 250 copies / mL. A negative result does not preclude SARS-CoV-2 infection and should not be used as the sole basis for treatment or other patient management decisions.  A negative result may occur with improper specimen collection / handling, submission of specimen other than nasopharyngeal swab, presence of viral mutation(s) within the areas targeted by this assay, and inadequate number of viral copies (<250 copies / mL). A negative result must be combined with clinical observations, patient history, and epidemiological information.  Fact Sheet for Patients:   BoilerBrush.com.cy  Fact Sheet for Healthcare Providers: https://pope.com/  This test is not yet approved or  cleared by the [place] FDA and has been authorized for detection and/or diagnosis of SARS-CoV-2 by FDA under an Emergency Use Authorization (EUA).  This EUA will remain in effect (meaning this test can be used) for the duration of the COVID-19 declaration under Section 564(b)(1) of the Act, 21 U.S.C. section 360bbb-3(b)(1), unless the authorization is terminated or revoked sooner.  Performed at  [facility], [address], [place], [place] [address]   Culture, blood (routine x 2)     Status: None (Preliminary result)   Collection Time: 01/16/20  6:37 AM   Specimen: BLOOD LEFT ARM  Result Value Ref Range Status   Specimen Description BLOOD LEFT ARM  Final   Special Requests   Final    BOTTLES DRAWN AEROBIC AND ANAEROBIC Blood Culture results may not be optimal due to an inadequate volume of blood received in culture bottles   Culture   Final    NO GROWTH < 24 HOURS Performed at [facility], [address], [place], [place] [address]    Report Status  PENDING  Incomplete  Culture, blood (routine x 2)     Status: None (Preliminary result)   Collection Time: 01/16/20  6:37 AM   Specimen: BLOOD LEFT HAND  Result Value Ref Range Status   Specimen Description BLOOD LEFT HAND  Final   Special Requests   Final    BOTTLES DRAWN AEROBIC AND ANAEROBIC Blood Culture adequate volume   Culture   Final    NO GROWTH < 24 HOURS Performed at [facility], [address], [place], [place] [address]    Report Status PENDING  Incomplete     Rexene Alberts, MSN, NP-C [facility]  [email or web address] Pager: [phone] Office: [phone] [facility] Main Line: [phone]

## 2020-01-18 NOTE — Progress Notes (Signed)
Inpatient Diabetes Program Recommendations  AACE/ADA: New Consensus Statement on Inpatient Glycemic Control (2015)  Target Ranges:  Prepandial:   less than 140 mg/dL      Peak postprandial:   less than 180 mg/dL (1-2 hours)      Critically ill patients:  140 - 180 mg/dL   Lab Results  Component Value Date   GLUCAP 308 (H) 01/18/2020   HGBA1C 12.0 (H) 01/16/2020    Review of Glycemic Control Results for Bianca Yang, Bianca Yang (MRN [id number]) as of 01/18/2020 10:59  Ref. Range 01/17/2020 08:05 01/17/2020 12:46 01/17/2020 16:49 01/17/2020 21:22 01/18/2020 08:25  Glucose-Capillary Latest Ref Range: 70 - 99 mg/dL 546 (H) 568 (H) 127 (H) 368 (H) 308 (H)    Diabetes history: DM 2 Outpatient Diabetes medications: Levemir and metformin in past unable to get according to pt Current orders for Inpatient glycemic control:  Levemir 40 units qhs Novolog 0-15 units tid  Inpatient Diabetes Program Recommendations:    -  Consider increasing Levemir to 50 units.  -  Add Novolog 5 units tid meal coverage if eating >50% of meals.  Thanks,  Christena Deem RN, MSN, BC-ADM Inpatient Diabetes Coordinator Team Pager [phone] (8a-5p)

## 2020-01-19 DIAGNOSIS — Z6825 Body mass index (BMI) 25.0-25.9, adult: Secondary | ICD-10-CM

## 2020-01-19 LAB — GLUCOSE, CAPILLARY
Glucose-Capillary: 297 mg/dL — ABNORMAL HIGH (ref 70–99)
Glucose-Capillary: 254 mg/dL — ABNORMAL HIGH (ref 70–99)
Glucose-Capillary: 136 mg/dL — ABNORMAL HIGH (ref 70–99)
Glucose-Capillary: 339 mg/dL — ABNORMAL HIGH (ref 70–99)
Glucose-Capillary: 131 mg/dL — ABNORMAL HIGH (ref 70–99)

## 2020-01-19 NOTE — Progress Notes (Signed)
[facility]  Date of Admission:  01/15/2020      Total days of antibiotics           ASSESSMENT: Bianca Yang is a 44 y.o. female with what appears to be osteomyelitis on the right 5th metatarsal and destruction of the bone. Ankle has no septic arthritis/osteomyelitis. She would like to discuss with orthopedics options again.   Denies any anal sex and refuses anal swab - please collect oral swab (orange Aptima swab) for testing. Syphilis rash fading - explained this may take a few weeks to go away completely    PLAN: 1. Patient would like to consult again with orthopedics  2. Oral gonorrhea swab to be collected by nursing please 3. Hold antibiotics pending #1    Active Problems:   Uncontrolled diabetes mellitus with skin complications, with long-term current use of insulin (HCC)   Cellulitis in diabetic foot (HCC)   Osteomyelitis (HCC)   Trichomonas vaginitis   Secondary syphilis   Bacterial vaginosis   Overweight (BMI 25.0-29.9)   Cocaine abuse (HCC)   . enoxaparin (LOVENOX) injection  40 mg Subcutaneous Q24H  . insulin aspart  0-15 Units Subcutaneous TID WC  . insulin aspart  10 Units Subcutaneous TID WC  . insulin detemir  50 Units Subcutaneous QHS  . metroNIDAZOLE  500 mg Oral Q12H    SUBJECTIVE: Ankle pain is better and able to walk on it. Still some redness.  Still with vaginal drainage.    Review of Systems: Review of Systems  Constitutional: Negative for chills and fever.  HENT: Negative for tinnitus.   Eyes: Negative for blurred vision and photophobia.  Respiratory: Negative for cough and sputum production.   Cardiovascular: Negative for chest pain.  Gastrointestinal: Negative for diarrhea, nausea and vomiting.  Genitourinary: Negative for dysuria.  Musculoskeletal: Positive for joint pain.  Skin: Negative for rash.  Neurological: Negative for headaches.    Allergies  Allergen Reactions  . Sulfa  Antibiotics Other (See Comments)    Unknown/ childhood allergy.     OBJECTIVE: Vitals:   01/18/20 1456 01/18/20 2138 01/19/20 0500 01/19/20 1407  BP: (!) 132/82 114/74 113/71 113/74  Pulse: 88 91 87 102  Resp: 18 18  20   Temp: 98 F (36.7 C) 98.7 F (37.1 C) 98.6 F (37 C) 98.9 F (37.2 C)  TempSrc: Oral Oral Oral Oral  SpO2: 95% 96% 95% 100%  Weight:      Height:       Body mass index is 25.84 kg/m.  Physical Exam Constitutional:      Appearance: Normal appearance.  Cardiovascular:     Rate and Rhythm: Normal rate and regular rhythm.  Pulmonary:     Effort: Pulmonary effort is normal.  Musculoskeletal:     Comments: R great toe with dry plantar ulcer. Small non erythematous, non fluctuant nodule to the lateral side of foot. Ankle can be palpated today with less pain. No significant swelling.   Skin:    General: Skin is warm and dry.     Capillary Refill: Capillary refill takes less than 2 seconds.  Neurological:     Mental Status: She is alert and oriented to person, place, and time.     Lab Results Lab Results  Component Value Date   WBC 11.5 (H) 01/17/2020   HGB 12.9 01/17/2020   HCT 43.6 01/17/2020   MCV 88.1 01/17/2020   PLT 237 01/17/2020  Lab Results  Component Value Date   CREATININE 0.63 01/17/2020   BUN 18 01/17/2020   NA 138 01/17/2020   K 4.2 01/17/2020   CL 94 (L) 01/17/2020   CO2 38 (H) 01/17/2020    Lab Results  Component Value Date   ALT 15 07/04/2017   AST 19 07/04/2017   ALKPHOS 98 07/04/2017   BILITOT 0.7 07/04/2017     Microbiology: Recent Results (from the past 240 hour(s))  Wet prep, genital     Status: Abnormal   Collection Time: 01/16/20  5:33 AM   Specimen: PATH Cytology Cervicovaginal Ancillary Only  Result Value Ref Range Status   Yeast Wet Prep HPF POC NONE SEEN NONE SEEN Final   Trich, Wet Prep PRESENT (A) NONE SEEN Final   Clue Cells Wet Prep HPF POC PRESENT (A) NONE SEEN Final   WBC, Wet Prep HPF POC MANY (A)  NONE SEEN Final   Sperm NONE SEEN  Final    Comment: Performed at [facility], [address] [place], [place] [address]  SARS Coronavirus 2 by RT PCR (hospital order, performed in [facility] hospital lab) Nasopharyngeal Nasopharyngeal Swab     Status: None   Collection Time: 01/16/20  6:37 AM   Specimen: Nasopharyngeal Swab  Result Value Ref Range Status   SARS Coronavirus 2 NEGATIVE NEGATIVE Final    Comment: (NOTE) SARS-CoV-2 target nucleic acids are NOT DETECTED.  The SARS-CoV-2 RNA is generally detectable in upper and lower respiratory specimens during the acute phase of infection. The lowest concentration of SARS-CoV-2 viral copies this assay can detect is 250 copies / mL. A negative result does not preclude SARS-CoV-2 infection and should not be used as the sole basis for treatment or other patient management decisions.  A negative result may occur with improper specimen collection / handling, submission of specimen other than nasopharyngeal swab, presence of viral mutation(s) within the areas targeted by this assay, and inadequate number of viral copies (<250 copies / mL). A negative result must be combined with clinical observations, patient history, and epidemiological information.  Fact Sheet for Patients:   BoilerBrush.com.cy  Fact Sheet for Healthcare Providers: https://pope.com/  This test is not yet approved or  cleared by the [place] FDA and has been authorized for detection and/or diagnosis of SARS-CoV-2 by FDA under an Emergency Use Authorization (EUA).  This EUA will remain in effect (meaning this test can be used) for the duration of the COVID-19 declaration under Section 564(b)(1) of the Act, 21 U.S.C. section 360bbb-3(b)(1), unless the authorization is terminated or revoked sooner.  Performed at [facility], [address] [place], [place] [address]   Culture, blood (routine x 2)      Status: None (Preliminary result)   Collection Time: 01/16/20  6:37 AM   Specimen: BLOOD LEFT ARM  Result Value Ref Range Status   Specimen Description BLOOD LEFT ARM  Final   Special Requests   Final    BOTTLES DRAWN AEROBIC AND ANAEROBIC Blood Culture results may not be optimal due to an inadequate volume of blood received in culture bottles   Culture   Final    NO GROWTH 3 DAYS Performed at [facility], [address] [place], [place] [address]    Report Status PENDING  Incomplete  Culture, blood (routine x 2)     Status: None (Preliminary result)   Collection Time: 01/16/20  6:37 AM   Specimen: BLOOD LEFT HAND  Result Value Ref Range Status   Specimen Description BLOOD LEFT HAND  Final   Special Requests   Final    BOTTLES DRAWN AEROBIC AND ANAEROBIC Blood Culture adequate volume   Culture   Final    NO GROWTH 3 DAYS Performed at [facility], [address], [place], [place] [address]    Report Status PENDING  Incomplete     Rexene Alberts, MSN, NP-C [facility]  [email or web address] Pager: [phone] Office: [phone] [facility] Main Line: [phone]

## 2020-01-19 NOTE — Progress Notes (Addendum)
[facility]  PROGRESS NOTE  Bianca Yang SAY:[id number] DOB: June 17, 1976 DOA: 01/15/2020 PCP: Hoy Register, MD   Brief HPI:   44 year old female with medical history of diabetes mellitus type 2, STDs, PID was admitted on 01/20/2020 with right foot cellulitis/osteomyelitis of fifth metatarsal.  At time of admission she complained of vaginal discharge and pelvic exam was done which showed that she had bacterial vaginosis and trichomonas.  Patient was started on Flagyl 500 mg p.o. twice daily for 7 days.  She was also noted to have rash on her trunk concerning for syphilis and RPR was found to be positive.  She was1 dose of IM penicillin.   Subjective   Patient seen and examined, MRI of right foot confirms osteomyelitis of right fifth metatarsal.  No drainable abscess noted.  MRI ankle is unremarkable.   Assessment/Plan:     1. Osteomyelitis of fifth proximal phalanx-confirmed on MRI foot.  No abnormality noted on MRI ankle.  No abscess.  Will await orthopedics recommendation.  Patient is not on IV antibiotics. 2. Diabetes mellitus type 2-blood glucose uncontrolled, continue insulin detemir 50 units subcu daily, added meal coverage 10 units insulin aspart 3 times daily, sliding scale insulin NovoLog.  Follow CBG before every meal and at bedtime.  Patient is noncompliant with her diet. 3. 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  ID to follow as outpatient. 4. Trichomonas vaginitis/bacterial vaginosis-continue Flagyl 5 mg p.o. twice daily for total 7 days.  Patient agrees for oral swab for gonorrhea. 5. Overweight-BMI greater than 25.   Scheduled medications:    enoxaparin (LOVENOX) injection  40 mg Subcutaneous Q24H   insulin aspart  0-15 Units Subcutaneous TID WC   insulin aspart  10 Units Subcutaneous TID WC   insulin detemir  50 Units Subcutaneous QHS   metroNIDAZOLE  500 mg Oral Q12H      CBG: Recent Labs  Lab  01/18/20 1640 01/18/20 2132 01/19/20 0428 01/19/20 0739 01/19/20 1149  GLUCAP 356* 430* 297* 254* 136*    SpO2: 100 % O2 Flow Rate (L/min): 2 L/min    CBC: Recent Labs  Lab 01/16/20 0504 01/17/20 0218  WBC 11.6* 11.5*  NEUTROABS 9.3*  --   HGB 13.5 12.9  HCT 44.4 43.6  MCV 88.8 88.1  PLT 247 237    Basic Metabolic Panel: Recent Labs  Lab 01/16/20 0504 01/17/20 0218  NA 135 138  K 4.1 4.2  CL 93* 94*  CO2 33* 38*  GLUCOSE 358* 256*  BUN 9 18  CREATININE 0.51 0.63  CALCIUM 9.4 9.3      Antibiotics: Anti-infectives (From admission, onward)   Start     Dose/Rate Route Frequency Ordered Stop   01/17/20 1400  metroNIDAZOLE (FLAGYL) tablet 500 mg  Status:  Discontinued        500 mg Oral Every 12 hours 01/17/20 1356 01/17/20 1356   01/17/20 1400  metroNIDAZOLE (FLAGYL) tablet 500 mg     Discontinue     500 mg Oral Every 12 hours 01/17/20 1356 01/23/20 0959   01/16/20 2000  vancomycin (VANCOCIN) IVPB 1000 mg/200 mL premix  Status:  Discontinued        1,000 mg 200 mL/hr over 60 Minutes Intravenous Every 12 hours 01/16/20 0655 01/16/20 1357   01/16/20 1400  penicillin g benzathine (BICILLIN LA) 1200000 UNIT/2ML injection 2.4 Million Units        2.4 Million Units Intramuscular  Once 01/16/20 1356 01/16/20 1657  01/16/20 0800  ceFEPIme (MAXIPIME) 2 g in sodium chloride 0.9 % 100 mL IVPB  Status:  Discontinued        2 g 200 mL/hr over 30 Minutes Intravenous Every 8 hours 01/16/20 0759 01/16/20 1356   01/16/20 0700  cefTRIAXone (ROCEPHIN) injection 250 mg  Status:  Discontinued        250 mg Intramuscular  Once 01/16/20 0646 01/16/20 0646   01/16/20 0700  azithromycin (ZITHROMAX) tablet 1,000 mg        1,000 mg Oral  Once 01/16/20 0646 01/16/20 0827   01/16/20 0700  metroNIDAZOLE (FLAGYL) tablet 2,000 mg        2,000 mg Oral  Once 01/16/20 0646 01/16/20 0825   01/16/20 0700  cefTRIAXone (ROCEPHIN) injection 500 mg        500 mg Intramuscular  Once 01/16/20 0646  01/16/20 0828   01/16/20 0645  vancomycin (VANCOREADY) IVPB 1500 mg/300 mL        1,500 mg 150 mL/hr over 120 Minutes Intravenous  Once 01/16/20 9147 01/16/20 0957   01/16/20 0645  piperacillin-tazobactam (ZOSYN) IVPB 3.375 g  Status:  Discontinued        3.375 g 12.5 mL/hr over 240 Minutes Intravenous Every 8 hours 01/16/20 8295 01/16/20 0759   01/16/20 0600  cefTRIAXone (ROCEPHIN) injection 500 mg  Status:  Discontinued        500 mg Intramuscular  Once 01/16/20 0548 01/16/20 0607   01/16/20 0600  azithromycin (ZITHROMAX) tablet 1,000 mg  Status:  Discontinued        1,000 mg Oral  Once 01/16/20 0548 01/16/20 0607   01/16/20 0600  metroNIDAZOLE (FLAGYL) tablet 2,000 mg  Status:  Discontinued        2,000 mg Oral  Once 01/16/20 0548 01/16/20 0607       DVT prophylaxis: Lovenox  Code Status: Full code  Family Communication: No family at bedside    Status is: Inpatient  Dispo: The patient is from: Home              Anticipated d/c is to: Home              Anticipated d/c date is: 01/21/2020              Patient currently undergoing evaluation and treatment for right fifth metatarsal osteomyelitis  Barrier to discharge-evaluation for right fifth metatarsal osteomyelitis,      Consultants:  Orthopedics  Infectious disease  Procedures:  None   Objective   Vitals:   01/18/20 1456 01/18/20 2138 01/19/20 0500 01/19/20 1407  BP: (!) 132/82 114/74 113/71 113/74  Pulse: 88 91 87 102  Resp: 18 18  20   Temp: 98 F (36.7 C) 98.7 F (37.1 C) 98.6 F (37 C) 98.9 F (37.2 C)  TempSrc: Oral Oral Oral Oral  SpO2: 95% 96% 95% 100%  Weight:      Height:        Intake/Output Summary (Last 24 hours) at 01/19/2020 1536 Last data filed at 01/19/2020 0855 Gross per 24 hour  Intake 700 ml  Output 600 ml  Net 100 ml    07/21 1901 - 07/23 0700 In: 720 [P.O.:720] Out: 600 [Urine:600]  Filed Weights   01/15/20 1826  Weight: 74.8 kg    Physical  Examination:   General-appears in no acute distress  Heart-S1-S2, regular, no murmur auscultated  Lungs-clear to auscultation bilaterally, no wheezing or crackles auscultated  Abdomen-soft, nontender, no organomegaly  Extremities-no edema in the  lower extremities  Foot-mild dusky erythema on the lateral aspect of right foot, nontender to palpation  Neuro-alert, oriented x3, no focal deficit noted    Data Reviewed:   Recent Results (from the past 240 hour(s))  Wet prep, genital     Status: Abnormal   Collection Time: 01/16/20  5:33 AM   Specimen: PATH Cytology Cervicovaginal Ancillary Only  Result Value Ref Range Status   Yeast Wet Prep HPF POC NONE SEEN NONE SEEN Final   Trich, Wet Prep PRESENT (A) NONE SEEN Final   Clue Cells Wet Prep HPF POC PRESENT (A) NONE SEEN Final   WBC, Wet Prep HPF POC MANY (A) NONE SEEN Final   Sperm NONE SEEN  Final    Comment: Performed at [facility], [address] [place], [place] [address]  SARS Coronavirus 2 by RT PCR (hospital order, performed in [facility] hospital lab) Nasopharyngeal Nasopharyngeal Swab     Status: None   Collection Time: 01/16/20  6:37 AM   Specimen: Nasopharyngeal Swab  Result Value Ref Range Status   SARS Coronavirus 2 NEGATIVE NEGATIVE Final    Comment: (NOTE) SARS-CoV-2 target nucleic acids are NOT DETECTED.  The SARS-CoV-2 RNA is generally detectable in upper and lower respiratory specimens during the acute phase of infection. The lowest concentration of SARS-CoV-2 viral copies this assay can detect is 250 copies / mL. A negative result does not preclude SARS-CoV-2 infection and should not be used as the sole basis for treatment or other patient management decisions.  A negative result may occur with improper specimen collection / handling, submission of specimen other than nasopharyngeal swab, presence of viral mutation(s) within the areas targeted by this assay, and inadequate number of viral  copies (<250 copies / mL). A negative result must be combined with clinical observations, patient history, and epidemiological information.  Fact Sheet for Patients:   BoilerBrush.com.cy  Fact Sheet for Healthcare Providers: https://pope.com/  This test is not yet approved or  cleared by the [place] FDA and has been authorized for detection and/or diagnosis of SARS-CoV-2 by FDA under an Emergency Use Authorization (EUA).  This EUA will remain in effect (meaning this test can be used) for the duration of the COVID-19 declaration under Section 564(b)(1) of the Act, 21 U.S.C. section 360bbb-3(b)(1), unless the authorization is terminated or revoked sooner.  Performed at [facility], [address] [place], [place] [address]   Culture, blood (routine x 2)     Status: None (Preliminary result)   Collection Time: 01/16/20  6:37 AM   Specimen: BLOOD LEFT ARM  Result Value Ref Range Status   Specimen Description BLOOD LEFT ARM  Final   Special Requests   Final    BOTTLES DRAWN AEROBIC AND ANAEROBIC Blood Culture results may not be optimal due to an inadequate volume of blood received in culture bottles   Culture   Final    NO GROWTH 3 DAYS Performed at [facility], [address], [place], [place] [address]    Report Status PENDING  Incomplete  Culture, blood (routine x 2)     Status: None (Preliminary result)   Collection Time: 01/16/20  6:37 AM   Specimen: BLOOD LEFT HAND  Result Value Ref Range Status   Specimen Description BLOOD LEFT HAND  Final   Special Requests   Final    BOTTLES DRAWN AEROBIC AND ANAEROBIC Blood Culture adequate volume   Culture   Final  NO GROWTH 3 DAYS Performed at [facility], [address], [place], [place] [address]    Report Status PENDING  Incomplete     Studies:  MR FOOT RIGHT WO CONTRAST  Result Date: 01/18/2020 CLINICAL DATA:  Right great toe ulcer. Fifth  proximal phalanx osteomyelitis on x-ray. EXAM: MRI OF THE RIGHT FOREFOOT WITHOUT CONTRAST; MRI OF THE RIGHT ANKLE WITHOUT CONTRAST TECHNIQUE: Multiplanar, multisequence MR imaging of the right ankle and foot was performed. No intravenous contrast was administered. COMPARISON:   IMPRESSION: 1. Osteomyelitis of the fifth proximal phalanx.  No abscess. 2. Small plantar great toe ulceration.  No underlying osteomyelitis. 3. No acute abnormality or significant degenerative changes of the ankle. Electronically Signed   By: Obie Dredge M.D.   On: 01/18/2020 16:24   MR ANKLE RIGHT WO CONTRAST  Result Date: 01/18/2020 CLINICAL DATA:  Right great toe ulcer. Fifth proximal phalanx osteomyelitis on x-ray. EXAM: MRI OF THE RIGHT FOREFOOT WITHOUT CONTRAST; MRI OF THE RIGHT ANKLE WITHOUT CONTRAST TECHNIQUE: Multiplanar, multisequence MR imaging of the right ankle and foot was performed. No intravenous contrast was administered. COMPARISON:  Right foot x-rays dated January 16, 2020. FINDINGS:IMPRESSION: 1. Osteomyelitis of the fifth proximal phalanx.  No abscess. 2. Small plantar great toe ulceration.  No underlying osteomyelitis. 3. No acute abnormality or significant degenerative changes of the ankle. Electronically Signed   By: Obie Dredge M.D.   On: 01/18/2020 16:24     Erilyn Pearman S Kisha Messman   [facility] If 7PM-7AM, please contact night-coverage at [email or web address], Office  [phone]   01/19/2020, 3:36 PM  LOS: 3 days

## 2020-01-19 NOTE — Progress Notes (Signed)
Inpatient Diabetes Program Recommendations  AACE/ADA: New Consensus Statement on Inpatient Glycemic Control (2015)  Target Ranges:  Prepandial:   less than 140 mg/dL      Peak postprandial:   less than 180 mg/dL (1-2 hours)      Critically ill patients:  140 - 180 mg/dL   Lab Results  Component Value Date   GLUCAP 254 (H) 01/19/2020   HGBA1C 12.0 (H) 01/16/2020    Review of Glycemic Control Results for Bianca Yang, Bianca Yang (MRN [id number]) as of 01/19/2020 09:59  Ref. Range 01/18/2020 08:25 01/18/2020 11:37 01/18/2020 16:40 01/18/2020 21:32 01/19/2020 04:28 01/19/2020 07:39  Glucose-Capillary Latest Ref Range: 70 - 99 mg/dL 209 (H) 470 (H) 962 (H) 430 (H) 297 (H) 254 (H)    Diabetes history: DM 2 Outpatient Diabetes medications: Levemir and metformin in past unable to get according to pt Current orders for Inpatient glycemic control:  Levemir 50 units qhs Novolog 0-15 units tid Novolog 10 units tid meal coverage  Inpatient Diabetes Program Recommendations:    Glucose trends better in the 250's this am.  -  Consider increasing Levemir to 60 units.  Noncompliant with Diet  Thanks,  Christena Deem RN, MSN, BC-ADM Inpatient Diabetes Coordinator Team Pager [phone] (8a-5p)

## 2020-01-19 NOTE — Progress Notes (Signed)
Pt is non compliant with diet, she keeps on asking for regular coke, refused diet coke, explained to her that her blood sugar is high and drinking regular coke will make her blood sugar higher. Still wants regular coke or regular sprite.

## 2020-01-19 NOTE — Plan of Care (Signed)

## 2020-01-19 NOTE — Progress Notes (Signed)
Pharmacy Antibiotic Note  Bianca Yang is a 44 y.o. female admitted on 01/15/2020 with wound infection/osteomyelitis .  Pharmacy has been consulted for Vancomycin/cefepime dosing but these are currently on hold per ID pending patient's decision for surgery.  Continue metronidazole for bacterial vaginosis/trichomonas.    Plan: Hold vanc/cefepime per ID, follow up abx/surgical plan  Continue Metronidazole 500 Q8 hr for BV/trich Monitor cultures, clinical status, renal fx Narrow abx as able and f/u duration    Height: 5\' 7"  (170.2 cm) Weight: 74.8 kg (165 lb) IBW/kg (Calculated) : 61.6  Temp (24hrs), Avg:98.7 F (37.1 C), Min:98.6 F (37 C), Max:98.9 F (37.2 C)  Recent Labs  Lab 01/16/20 0504 01/17/20 0218  WBC 11.6* 11.5*  CREATININE 0.51 0.63    Estimated Creatinine Clearance: 94.8 mL/min (by C-G formula based on SCr of 0.63 mg/dL).    Allergies  Allergen Reactions  . Sulfa Antibiotics Other (See Comments)    Unknown/ childhood allergy.    Antimicrobials: 7/20 vanc >> ON HOLD per ID 7/20 cefepime >> ON HOLD per ID 7/20 flagyl (for Trich)>>    7/20 ctx x 1 7/20 azithro x 1 7/20 PCN benzathine IM x 1  Microbiology: 7/20 wet prep - +trich, +clue cells 7/20 BCx - ng x24h 7/20 RPR titer +    8/20, PharmD, BCPS, [facility] Clinical Pharmacist  Please check AMION for all [facility] phone numbers After 10:00 PM, call Main Pharmacy [phone]

## 2020-01-20 LAB — CBC
HCT: 43.2 % (ref 36.0–46.0)
Hemoglobin: 12.9 g/dL (ref 12.0–15.0)
MCH: 26.5 pg (ref 26.0–34.0)
MCHC: 29.9 g/dL — ABNORMAL LOW (ref 30.0–36.0)
MCV: 88.7 fL (ref 80.0–100.0)
Platelets: 286 10*3/uL (ref 150–400)
RBC: 4.87 MIL/uL (ref 3.87–5.11)
RDW: 12.7 % (ref 11.5–15.5)
WBC: 10.8 10*3/uL — ABNORMAL HIGH (ref 4.0–10.5)
nRBC: 0 % (ref 0.0–0.2)

## 2020-01-20 LAB — GLUCOSE, CAPILLARY
Glucose-Capillary: 116 mg/dL — ABNORMAL HIGH (ref 70–99)
Glucose-Capillary: 127 mg/dL — ABNORMAL HIGH (ref 70–99)
Glucose-Capillary: 214 mg/dL — ABNORMAL HIGH (ref 70–99)

## 2020-01-20 MED ORDER — INSULIN DETEMIR 100 UNIT/ML ~~LOC~~ SOLN: 50.0000 [IU] | Freq: Every day | SUBCUTANEOUS | 11 refills | Status: DC

## 2020-01-20 MED ORDER — METFORMIN HCL ER 500 MG PO TB24: ORAL_TABLET | ORAL | 2 refills | Status: DC

## 2020-01-20 MED ORDER — BLOOD GLUCOSE MONITOR KIT: PACK | 0 refills | Status: DC

## 2020-01-20 MED ORDER — AMOXICILLIN-POT CLAVULANATE 875-125 MG PO TABS
1.0000 | ORAL_TABLET | Freq: Two times a day (BID) | ORAL | 0 refills | Status: AC
Start: 2020-01-20 — End: 2020-03-02

## 2020-01-20 MED ORDER — INSULIN SYRINGE 31G X 5/16" 0.5 ML MISC
0 refills | Status: DC
Start: 2020-01-20 — End: 2024-05-04

## 2020-01-20 MED ORDER — METRONIDAZOLE 500 MG PO TABS: 500.0000 mg | ORAL_TABLET | Freq: Two times a day (BID) | ORAL | 0 refills | Status: DC

## 2020-01-20 NOTE — Progress Notes (Signed)
      INFECTIOUS DISEASE ATTENDING ADDENDUM:   Date: 01/20/2020  Patient name: Bianca Yang  Medical record number: [id number]  Date of birth: 07-21-1975   Patient wants to try antibiotics for her osteomyelitis in her fifth toe.  This is not going to be effective but we can have her try a 6-week course of Augmentin.  He is still going to have a GC swab performed on oropharynx and discharging home today she has hospital follow-up with Dois Davenport has an appointment on 02/01/2020 at 10 am with Rexene Alberts  The [facility] for Infectious Disease is located in the [facility] at  [address] [place] in [place].  [address], which is located to the left of the elevators.  Phone: [phone]  Fax: [phone]  [email or web address]      Paulette Blanch Dam 01/20/2020, 11:34 AM

## 2020-01-20 NOTE — Discharge Summary (Addendum)
Physician Discharge Summary  Bianca Yang NUU:[id number] DOB: [birth year] DOA: 01/15/2020  PCP: Charlott Rakes, MD  Admit date: 01/15/2020 Discharge date: 01/20/2020  Time spent: 50 minutes  Recommendations for Outpatient Follow-up:  1. Follow-up infectious disease clinic in 4 weeks   Discharge Diagnoses:  Active Problems:   Uncontrolled diabetes mellitus with skin complications, with long-term current use of insulin (HCC)   Cellulitis in diabetic foot (HCC)   Osteomyelitis (HCC)   Trichomonas vaginitis   Secondary syphilis   Bacterial vaginosis   Overweight (BMI 25.0-29.9)   Cocaine abuse ([place])   Discharge Condition: Stable  Diet recommendation: Carb modified diet  Filed Weights   01/15/20 1826  Weight: 74.8 kg    History of present illness:  44 year old female with medical history of diabetes mellitus type 2, STDs, PID was admitted on 01/20/2020 with right foot cellulitis/osteomyelitis of fifth metatarsal.  At time of admission she complained of vaginal discharge and pelvic exam was done which showed that she had bacterial vaginosis and trichomonas.  Patient was started on Flagyl 500 mg p.o. twice daily for 7 days.  She was also noted to have rash on her trunk concerning for syphilis and RPR was found to be positive.  She was1 dose of IM penicillin.   Hospital Course:  1. Osteomyelitis of fifth proximal phalanx-confirmed on MRI foot.  No abnormality noted on MRI ankle.  No abscess.    Discussed with Orion Crook from orthopedics, he says that patient will need surgery.  Discussed with patient and she does not want surgery, Dr. Drucilla Schmidt recommends 6 weeks of Augmentin and follow-up in his clinic as outpatient.  Patient agrees to take Augmentin 1 tablet p.o. twice daily for 6 weeks. 2. Diabetes mellitus type 2-blood glucose uncontrolled, continue insulin detemir 50 units subcu daily, continue Metformin 2 tablets daily. 3. 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  ID to follow as outpatient. 4. Trichomonas vaginitis/bacterial vaginosis-continue Flagyl 500 mg p.o. twice daily for total 7 days.  Patient agrees for oral swab for gonorrhea.  Will discharge on Flagyl 500 mg p.o. daily for 3 more days. 5. Overweight-BMI greater than 25. 6. Patient does not have money to pay for medications, discussed with case management and she will be getting medicines from match program.   Procedures:  Venous duplex of lower extremities-negative for DVT  Consultations:  Infectious disease  Discharge Exam: Vitals:   01/19/20 2025 01/20/20 0454  BP: 117/71 103/73  Pulse: 85 78  Resp: 18 16  Temp: 98.3 F (36.8 C) 97.9 F (36.6 C)  SpO2: 96% 98%    General: Appears in no acute distress Cardiovascular: S1-S2, regular Respiratory: Clear to auscultation bilaterally  Discharge Instructions   Discharge Instructions    Diet - low sodium heart healthy   Complete by: As directed    Increase activity slowly   Complete by: As directed      Allergies as of 01/20/2020      Reactions   Sulfa Antibiotics Other (See Comments)   Unknown/ childhood allergy.       Medication List    TAKE these medications   amoxicillin-clavulanate 875-125 MG tablet Commonly known as: Augmentin Take 1 tablet by mouth 2 (two) times daily.   blood glucose meter kit and supplies Kit Dispense based on patient and insurance preference. Use up to four times daily as directed. (FOR ICD-9 250.00, 250.01).   insulin detemir 100 UNIT/ML injection Commonly known as: LEVEMIR  Inject 0.5 mLs (50 Units total) into the skin at bedtime. What changed: how much to take   metFORMIN 500 MG 24 hr tablet Commonly known as: GLUCOPHAGE-XR 2 daily with breakfast   metroNIDAZOLE 500 MG tablet Commonly known as: FLAGYL Take 1 tablet (500 mg total) by mouth 2 (two) times daily.      Allergies  Allergen Reactions  . Sulfa Antibiotics Other (See  Comments)    Unknown/ childhood allergy.     Follow-up Information    Tommy Medal, Lavell Islam, MD. Schedule an appointment as soon as possible for a visit in 4 week(s).   Specialty: Infectious Diseases Contact information: [address] [place] [address] [phone]                The results of significant diagnostics from this hospitalization (including imaging, microbiology, ancillary and laboratory) are listed below for reference.    Significant Diagnostic Studies: DG Tibia/Fibula Right  Result Date: 01/16/2020 CLINICAL DATA:  Right lower extremity swelling. EXAM: RIGHT TIBIA AND FIBULA - 2 VIEW COMPARISON:  None. FINDINGS: The right knee and ankle joints are maintained. No acute bony findings or destructive bony changes. IMPRESSION: No acute bony findings. Electronically Signed   By: Marijo Sanes M.D.   On: 01/16/2020 05:39   MR FOOT RIGHT WO CONTRAST  Result Date: 01/18/2020 CLINICAL DATA:  Right great toe ulcer. Fifth proximal phalanx osteomyelitis on x-ray. EXAM: MRI OF THE RIGHT FOREFOOT WITHOUT CONTRAST; MRI OF THE RIGHT ANKLE WITHOUT CONTRAST TECHNIQUE: Multiplanar, multisequence MR imaging of the right ankle and foot was performed. No intravenous contrast was administered. COMPARISON:  Right foot x-rays dated January 16, 2020. FINDINGS: TENDONS Peroneal: Peroneal longus tendon intact. Peroneal brevis intact. Posteromedial: Posterior tibial tendon intact. Flexor hallucis longus tendon intact. Flexor digitorum longus tendon intact. Anterior: Tibialis anterior tendon intact. Extensor hallucis longus tendon intact Extensor digitorum longus tendon intact. Achilles:  Intact. Plantar Fascia: Intact. LIGAMENTS Lateral: Anterior talofibular ligament intact. Calcaneofibular ligament intact. Posterior talofibular ligament intact. Anterior and posterior tibiofibular ligaments intact. Medial: Deltoid ligament intact. Spring ligament intact. Lisfranc ligament intact. Toe collateral  ligaments are intact. CARTILAGE Ankle Joint: No joint effusion. Normal ankle mortise. Tiny focus of subchondral marrow edema in the lateral talar dome, likely degenerative. Subtalar Joints/Sinus Tarsi: Normal subtalar joints. No subtalar joint effusion. Normal sinus tarsi. Bones: Abnormal marrow edema with corresponding decreased T1 marrow signal and destruction of the fifth proximal phalanx, consistent with osteomyelitis. Remaining bone marrow signal is unremarkable. No fracture or dislocation. Soft Tissue: Small plantar great toe ulceration. Mild anterolateral ankle and dorsolateral foot soft tissue swelling. No soft tissue mass or fluid collection. IMPRESSION: 1. Osteomyelitis of the fifth proximal phalanx.  No abscess. 2. Small plantar great toe ulceration.  No underlying osteomyelitis. 3. No acute abnormality or significant degenerative changes of the ankle. Electronically Signed   By: Titus Dubin M.D.   On: 01/18/2020 16:24   MR ANKLE RIGHT WO CONTRAST  Result Date: 01/18/2020 CLINICAL DATA:  Right great toe ulcer. Fifth proximal phalanx osteomyelitis on x-ray. EXAM: MRI OF THE RIGHT FOREFOOT WITHOUT CONTRAST; MRI OF THE RIGHT ANKLE WITHOUT CONTRAST TECHNIQUE: Multiplanar, multisequence MR imaging of the right ankle and foot was performed. No intravenous contrast was administered. COMPARISON:  Right foot x-rays dated January 16, 2020. FINDINGS: TENDONS Peroneal: Peroneal longus tendon intact. Peroneal brevis intact. Posteromedial: Posterior tibial tendon intact. Flexor hallucis longus tendon intact. Flexor digitorum longus tendon intact. Anterior: Tibialis anterior tendon intact. Extensor hallucis  longus tendon intact Extensor digitorum longus tendon intact. Achilles:  Intact. Plantar Fascia: Intact. LIGAMENTS Lateral: Anterior talofibular ligament intact. Calcaneofibular ligament intact. Posterior talofibular ligament intact. Anterior and posterior tibiofibular ligaments intact. Medial: Deltoid ligament  intact. Spring ligament intact. Lisfranc ligament intact. Toe collateral ligaments are intact. CARTILAGE Ankle Joint: No joint effusion. Normal ankle mortise. Tiny focus of subchondral marrow edema in the lateral talar dome, likely degenerative. Subtalar Joints/Sinus Tarsi: Normal subtalar joints. No subtalar joint effusion. Normal sinus tarsi. Bones: Abnormal marrow edema with corresponding decreased T1 marrow signal and destruction of the fifth proximal phalanx, consistent with osteomyelitis. Remaining bone marrow signal is unremarkable. No fracture or dislocation. Soft Tissue: Small plantar great toe ulceration. Mild anterolateral ankle and dorsolateral foot soft tissue swelling. No soft tissue mass or fluid collection. IMPRESSION: 1. Osteomyelitis of the fifth proximal phalanx.  No abscess. 2. Small plantar great toe ulceration.  No underlying osteomyelitis. 3. No acute abnormality or significant degenerative changes of the ankle. Electronically Signed   By: Titus Dubin M.D.   On: 01/18/2020 16:24   DG Foot Complete Right  Result Date: 01/16/2020 CLINICAL DATA:  Pain, swelling and redness of the right foot for 1 week. EXAM: RIGHT FOOT COMPLETE - 3+ VIEW COMPARISON:  None. FINDINGS: Marked soft tissue swelling is noted along the lateral aspect of the forefoot. The base of the fifth proximal phalanx is destroyed. Findings consistent with osteomyelitis. No involvement of the fifth MTP joint to suggest septic arthritis. The other bony structures are intact. I do not see any other obvious areas of osteomyelitis. There is diffuse soft tissue swelling/edema suggesting cellulitis. Calcaneal spurring changes are noted. IMPRESSION: 1. Plain film findings consistent with osteomyelitis involving the base of the fifth proximal phalanx. 2. Diffuse soft tissue swelling/edema suggesting cellulitis. Electronically Signed   By: Marijo Sanes M.D.   On: 01/16/2020 05:38   VAS [place] LOWER EXTREMITY VENOUS (DVT) (ONLY [facility] &  [facility])  Result Date: 01/16/2020  Lower Venous DVTStudy Indications: Swelling.  Risk Factors: None identified. Limitations: Patient movement, patient pain tolerance. Comparison Study: No prior studies. Performing Technologist: Oliver Hum RVT  Examination Guidelines: A complete evaluation includes B-mode imaging, spectral Doppler, color Doppler, and power Doppler as needed of all accessible portions of each vessel. Bilateral testing is considered an integral part of a complete examination. Limited examinations for reoccurring indications may be performed as noted. The reflux portion of the exam is performed with the patient in reverse Trendelenburg.  +---------+---------------+---------+-----------+----------+--------------+ RIGHT    CompressibilityPhasicitySpontaneityPropertiesThrombus Aging +---------+---------------+---------+-----------+----------+--------------+ CFV      Full           Yes      Yes                                 +---------+---------------+---------+-----------+----------+--------------+ SFJ      Full                                                        +---------+---------------+---------+-----------+----------+--------------+ FV Prox  Full                                                        +---------+---------------+---------+-----------+----------+--------------+  FV Mid   Full                                                        +---------+---------------+---------+-----------+----------+--------------+ FV DistalFull                                                        +---------+---------------+---------+-----------+----------+--------------+ PFV      Full                                                        +---------+---------------+---------+-----------+----------+--------------+ POP      Full           Yes      Yes                                  +---------+---------------+---------+-----------+----------+--------------+ PTV      Full                                                        +---------+---------------+---------+-----------+----------+--------------+ PERO     Full                                                        +---------+---------------+---------+-----------+----------+--------------+   +----+---------------+---------+-----------+----------+--------------+ LEFTCompressibilityPhasicitySpontaneityPropertiesThrombus Aging +----+---------------+---------+-----------+----------+--------------+ CFV Full           Yes      Yes                                 +----+---------------+---------+-----------+----------+--------------+     Summary: RIGHT: - There is no evidence of deep vein thrombosis in the lower extremity.  - No cystic structure found in the popliteal fossa.  LEFT: - No evidence of common femoral vein obstruction.  *See table(s) above for measurements and observations. Electronically signed by Servando Snare MD on 01/16/2020 at 5:34:55 PM.    Final     Microbiology: Recent Results (from the past 240 hour(s))  Wet prep, genital     Status: Abnormal   Collection Time: 01/16/20  5:33 AM   Specimen: PATH Cytology Cervicovaginal Ancillary Only  Result Value Ref Range Status   Yeast Wet Prep HPF POC NONE SEEN NONE SEEN Final   Trich, Wet Prep PRESENT (A) NONE SEEN Final   Clue Cells Wet Prep HPF POC PRESENT (A) NONE SEEN Final   WBC, Wet Prep HPF POC MANY (A) NONE SEEN Final   Sperm NONE SEEN  Final    Comment: Performed at [facility], [address] [place], [place] [address]  SARS Coronavirus 2 by RT PCR (hospital order, performed in [facility] hospital lab) Nasopharyngeal Nasopharyngeal Swab     Status: None   Collection Time: 01/16/20  6:37 AM   Specimen: Nasopharyngeal Swab  Result Value Ref Range Status   SARS Coronavirus 2 NEGATIVE NEGATIVE Final    Comment: (NOTE) SARS-CoV-2  target nucleic acids are NOT DETECTED.  The SARS-CoV-2 RNA is generally detectable in upper and lower respiratory specimens during the acute phase of infection. The lowest concentration of SARS-CoV-2 viral copies this assay can detect is 250 copies / mL. A negative result does not preclude SARS-CoV-2 infection and should not be used as the sole basis for treatment or other patient management decisions.  A negative result may occur with improper specimen collection / handling, submission of specimen other than nasopharyngeal swab, presence of viral mutation(s) within the areas targeted by this assay, and inadequate number of viral copies (<250 copies / mL). A negative result must be combined with clinical observations, patient history, and epidemiological information.  Fact Sheet for Patients:   StrictlyIdeas.no  Fact Sheet for Healthcare Providers: BankingDealers.co.za  This test is not yet approved or  cleared by the [place] FDA and has been authorized for detection and/or diagnosis of SARS-CoV-2 by FDA under an Emergency Use Authorization (EUA).  This EUA will remain in effect (meaning this test can be used) for the duration of the COVID-19 declaration under Section 564(b)(1) of the Act, 21 U.S.C. section 360bbb-3(b)(1), unless the authorization is terminated or revoked sooner.  Performed at [facility], [place] [address], [place], [place] [address]   Culture, blood (routine x 2)     Status: None (Preliminary result)   Collection Time: 01/16/20  6:37 AM   Specimen: BLOOD LEFT ARM  Result Value Ref Range Status   Specimen Description BLOOD LEFT ARM  Final   Special Requests   Final    BOTTLES DRAWN AEROBIC AND ANAEROBIC Blood Culture results may not be optimal due to an inadequate volume of blood received in culture bottles   Culture   Final    NO GROWTH 4 DAYS Performed at [facility], [place] [address]  [place], [place] [address]    Report Status PENDING  Incomplete  Culture, blood (routine x 2)     Status: None (Preliminary result)   Collection Time: 01/16/20  6:37 AM   Specimen: BLOOD LEFT HAND  Result Value Ref Range Status   Specimen Description BLOOD LEFT HAND  Final   Special Requests   Final    BOTTLES DRAWN AEROBIC AND ANAEROBIC Blood Culture adequate volume   Culture   Final    NO GROWTH 4 DAYS Performed at [facility], [place] [address], [place], [place] [address]    Report Status PENDING  Incomplete     Labs: Basic Metabolic Panel: Recent Labs  Lab 01/16/20 0504 01/17/20 0218  NA 135 138  K 4.1 4.2  CL 93* 94*  CO2 33* 38*  GLUCOSE 358* 256*  BUN 9 18  CREATININE 0.51 0.63  CALCIUM 9.4 9.3   Liver Function Tests: No results for input(s): AST, ALT, ALKPHOS, BILITOT, PROT, ALBUMIN in the last 168 hours. No results for input(s): LIPASE, AMYLASE in the last 168 hours. No results for input(s): AMMONIA in the last 168 hours. CBC: Recent Labs  Lab 01/16/20 0504 01/17/20 0218 01/20/20 0507  WBC 11.6* 11.5* 10.8*  NEUTROABS 9.3*  --   --   HGB  13.5 12.9 12.9  HCT 44.4 43.6 43.2  MCV 88.8 88.1 88.7  PLT 247 237 286   CBG: Recent Labs  Lab 01/19/20 1149 01/19/20 1721 01/19/20 2027 01/20/20 0749 01/20/20 1143  GLUCAP 136* 339* 131* 116* 127*       Signed:  Oswald Hillock MD.  [facility] 01/20/2020, 12:44 PM

## 2020-01-20 NOTE — Progress Notes (Signed)
Discharge paperwork reviewed at bedside with patient.  Patient verbalized understanding and declined further instructions. Patient to be transported off unit via wheelchair and staff supervision upon arrival of family.

## 2020-01-20 NOTE — Care Management (Addendum)
Patient given MATCH letter to fill scripts.  This is patient's second MATCH in 6 months.  Patient appears to go to [facility] and should fill scripts when appropriate and obtain glucometer there if unable to pay at [facility] today.

## 2020-01-21 LAB — CULTURE, BLOOD (ROUTINE X 2)
Culture: NO GROWTH
Culture: NO GROWTH
Special Requests: ADEQUATE

## 2020-01-22 ENCOUNTER — Telehealth: Payer: Self-pay

## 2020-01-22 LAB — GC/CHLAMYDIA PROBE AMP (~~LOC~~) NOT AT ARMC
Chlamydia: NEGATIVE
Comment: NEGATIVE
Comment: NORMAL
Neisseria Gonorrhea: NEGATIVE

## 2020-01-22 NOTE — Telephone Encounter (Signed)
Transition Care Management Follow-up Telephone Call Date of discharge and from where:01/20/2020, [facility]  Call placed to patient # [phone], message left with call back requested to this CM  She has not seen Dr Alvis Lemmings, who is listed as her PCP, in 3 years.  Need to confirm if she still plans to follow up with  Dr Alvis Lemmings and schedule a follow up appt if she does

## 2020-01-23 ENCOUNTER — Telehealth: Payer: Self-pay

## 2020-01-23 NOTE — Telephone Encounter (Signed)
Transition Care Management Follow-up Telephone Call Attempt # 2   Date of discharge and from where:01/20/2020, [facility]  Call placed to patient # [phone], message left with call back requested to this CM  She has not seen Dr Alvis Lemmings, who is listed as her PCP, in 3 years.  Need to confirm if she still plans to follow up with  Dr Alvis Lemmings and schedule a follow up appt if she does.   Letter sent to patient requesting that she contact this office.

## 2020-02-01 ENCOUNTER — Ambulatory Visit: Payer: Self-pay | Admitting: Infectious Diseases

## 2024-05-03 ENCOUNTER — Other Ambulatory Visit: Payer: Self-pay

## 2024-05-03 ENCOUNTER — Emergency Department (HOSPITAL_COMMUNITY)

## 2024-05-03 ENCOUNTER — Inpatient Hospital Stay (HOSPITAL_COMMUNITY)
Admission: EM | Admit: 2024-05-03 | Discharge: 2024-05-09 | DRG: 617 | Disposition: A | Discharge status: Home / Self Care | Attending: Family Medicine | Admitting: Family Medicine

## 2024-05-03 ENCOUNTER — Encounter (HOSPITAL_COMMUNITY): Payer: Self-pay

## 2024-05-03 DIAGNOSIS — Z91148 Patient's other noncompliance with medication regimen for other reason: Secondary | ICD-10-CM | POA: Diagnosis not present

## 2024-05-03 DIAGNOSIS — Z794 Long term (current) use of insulin: Secondary | ICD-10-CM

## 2024-05-03 DIAGNOSIS — Z5941 Food insecurity: Secondary | ICD-10-CM

## 2024-05-03 DIAGNOSIS — F1410 Cocaine abuse, uncomplicated: Secondary | ICD-10-CM | POA: Diagnosis present

## 2024-05-03 DIAGNOSIS — F1110 Opioid abuse, uncomplicated: Secondary | ICD-10-CM | POA: Diagnosis present

## 2024-05-03 DIAGNOSIS — E873 Alkalosis: Secondary | ICD-10-CM | POA: Diagnosis present

## 2024-05-03 DIAGNOSIS — M86672 Other chronic osteomyelitis, left ankle and foot: Secondary | ICD-10-CM | POA: Diagnosis present

## 2024-05-03 DIAGNOSIS — Z833 Family history of diabetes mellitus: Secondary | ICD-10-CM | POA: Diagnosis not present

## 2024-05-03 DIAGNOSIS — Z7984 Long term (current) use of oral hypoglycemic drugs: Secondary | ICD-10-CM

## 2024-05-03 DIAGNOSIS — K0889 Other specified disorders of teeth and supporting structures: Secondary | ICD-10-CM | POA: Diagnosis present

## 2024-05-03 DIAGNOSIS — E11628 Type 2 diabetes mellitus with other skin complications: Secondary | ICD-10-CM | POA: Diagnosis present

## 2024-05-03 DIAGNOSIS — F17210 Nicotine dependence, cigarettes, uncomplicated: Secondary | ICD-10-CM | POA: Diagnosis present

## 2024-05-03 DIAGNOSIS — Z5982 Transportation insecurity: Secondary | ICD-10-CM | POA: Diagnosis not present

## 2024-05-03 DIAGNOSIS — D72829 Elevated white blood cell count, unspecified: Secondary | ICD-10-CM | POA: Diagnosis present

## 2024-05-03 DIAGNOSIS — T383X6A Underdosing of insulin and oral hypoglycemic [antidiabetic] drugs, initial encounter: Secondary | ICD-10-CM | POA: Diagnosis present

## 2024-05-03 DIAGNOSIS — L089 Local infection of the skin and subcutaneous tissue, unspecified: Principal | ICD-10-CM | POA: Diagnosis present

## 2024-05-03 DIAGNOSIS — E11621 Type 2 diabetes mellitus with foot ulcer: Secondary | ICD-10-CM | POA: Diagnosis present

## 2024-05-03 DIAGNOSIS — Z7401 Bed confinement status: Secondary | ICD-10-CM | POA: Diagnosis not present

## 2024-05-03 DIAGNOSIS — E1110 Type 2 diabetes mellitus with ketoacidosis without coma: Secondary | ICD-10-CM | POA: Diagnosis present

## 2024-05-03 DIAGNOSIS — M86272 Subacute osteomyelitis, left ankle and foot: Secondary | ICD-10-CM | POA: Diagnosis not present

## 2024-05-03 DIAGNOSIS — L02612 Cutaneous abscess of left foot: Secondary | ICD-10-CM | POA: Diagnosis present

## 2024-05-03 DIAGNOSIS — B9562 Methicillin resistant Staphylococcus aureus infection as the cause of diseases classified elsewhere: Secondary | ICD-10-CM | POA: Diagnosis present

## 2024-05-03 DIAGNOSIS — E1169 Type 2 diabetes mellitus with other specified complication: Principal | ICD-10-CM | POA: Diagnosis present

## 2024-05-03 DIAGNOSIS — R52 Pain, unspecified: Secondary | ICD-10-CM | POA: Diagnosis not present

## 2024-05-03 DIAGNOSIS — Z9851 Tubal ligation status: Secondary | ICD-10-CM

## 2024-05-03 DIAGNOSIS — L97424 Non-pressure chronic ulcer of left heel and midfoot with necrosis of bone: Secondary | ICD-10-CM | POA: Diagnosis present

## 2024-05-03 DIAGNOSIS — I959 Hypotension, unspecified: Secondary | ICD-10-CM | POA: Diagnosis not present

## 2024-05-03 DIAGNOSIS — L0390 Cellulitis, unspecified: Secondary | ICD-10-CM | POA: Diagnosis not present

## 2024-05-03 DIAGNOSIS — Z882 Allergy status to sulfonamides status: Secondary | ICD-10-CM | POA: Diagnosis not present

## 2024-05-03 DIAGNOSIS — M869 Osteomyelitis, unspecified: Secondary | ICD-10-CM | POA: Diagnosis not present

## 2024-05-03 DIAGNOSIS — M79672 Pain in left foot: Secondary | ICD-10-CM | POA: Diagnosis not present

## 2024-05-03 DIAGNOSIS — R739 Hyperglycemia, unspecified: Secondary | ICD-10-CM | POA: Diagnosis present

## 2024-05-03 DIAGNOSIS — M86472 Chronic osteomyelitis with draining sinus, left ankle and foot: Secondary | ICD-10-CM | POA: Diagnosis not present

## 2024-05-03 DIAGNOSIS — Z8619 Personal history of other infectious and parasitic diseases: Secondary | ICD-10-CM

## 2024-05-03 DIAGNOSIS — M7989 Other specified soft tissue disorders: Secondary | ICD-10-CM | POA: Diagnosis not present

## 2024-05-03 DIAGNOSIS — L538 Other specified erythematous conditions: Secondary | ICD-10-CM | POA: Diagnosis not present

## 2024-05-03 DIAGNOSIS — L03116 Cellulitis of left lower limb: Secondary | ICD-10-CM | POA: Diagnosis present

## 2024-05-03 DIAGNOSIS — M86012 Acute hematogenous osteomyelitis, left shoulder: Secondary | ICD-10-CM

## 2024-05-03 HISTORY — DX: Other psychoactive substance abuse, uncomplicated: F19.10

## 2024-05-03 LAB — CBC WITH DIFFERENTIAL/PLATELET
Abs Immature Granulocytes: 0.09 K/uL — ABNORMAL HIGH (ref 0.00–0.07)
Basophils Absolute: 0.1 K/uL (ref 0.0–0.1)
Basophils Relative: 0 %
Eosinophils Absolute: 0.4 K/uL (ref 0.0–0.5)
Eosinophils Relative: 3 %
HCT: 44.3 % (ref 36.0–46.0)
Hemoglobin: 13 g/dL (ref 12.0–15.0)
Immature Granulocytes: 1 %
Lymphocytes Relative: 12 %
Lymphs Abs: 1.5 K/uL (ref 0.7–4.0)
MCH: 26.3 pg (ref 26.0–34.0)
MCHC: 29.3 g/dL — ABNORMAL LOW (ref 30.0–36.0)
MCV: 89.7 fL (ref 80.0–100.0)
Monocytes Absolute: 0.9 K/uL (ref 0.1–1.0)
Monocytes Relative: 6 %
Neutro Abs: 10.4 K/uL — ABNORMAL HIGH (ref 1.7–7.7)
Neutrophils Relative %: 78 %
Platelets: 255 K/uL (ref 150–400)
RBC: 4.94 MIL/uL (ref 3.87–5.11)
RDW: 13.2 % (ref 11.5–15.5)
WBC: 13.3 K/uL — ABNORMAL HIGH (ref 4.0–10.5)
nRBC: 0 % (ref 0.0–0.2)

## 2024-05-03 LAB — I-STAT CG4 LACTIC ACID, ED
Lactic Acid, Venous: 2.5 mmol/L (ref 0.5–1.9)
Lactic Acid, Venous: 2.2 mmol/L (ref 0.5–1.9)

## 2024-05-03 LAB — COMPREHENSIVE METABOLIC PANEL WITH GFR
ALT: 10 U/L (ref 0–44)
AST: 16 U/L (ref 15–41)
Albumin: 3.5 g/dL (ref 3.5–5.0)
Alkaline Phosphatase: 158 U/L — ABNORMAL HIGH (ref 38–126)
Anion gap: 10 (ref 5–15)
BUN: 17 mg/dL (ref 6–20)
CO2: 33 mmol/L — ABNORMAL HIGH (ref 22–32)
Calcium: 9.3 mg/dL (ref 8.9–10.3)
Chloride: 90 mmol/L — ABNORMAL LOW (ref 98–111)
Creatinine, Ser: 0.71 mg/dL (ref 0.44–1.00)
GFR, Estimated: >60 mL/min (ref >60)
Glucose, Bld: 534 mg/dL (ref 70–99)
Potassium: 3.8 mmol/L (ref 3.5–5.1)
Sodium: 133 mmol/L — ABNORMAL LOW (ref 135–145)
Total Bilirubin: 0.4 mg/dL (ref 0.0–1.2)
Total Protein: 8.1 g/dL (ref 6.5–8.1)

## 2024-05-03 LAB — GLUCOSE, CAPILLARY
Glucose-Capillary: 187 mg/dL — ABNORMAL HIGH (ref 70–99)
Glucose-Capillary: 186 mg/dL — ABNORMAL HIGH (ref 70–99)

## 2024-05-03 LAB — HCG, SERUM, QUALITATIVE: Preg, Serum: NEGATIVE

## 2024-05-03 LAB — CBG MONITORING, ED: Glucose-Capillary: 280 mg/dL — ABNORMAL HIGH (ref 70–99)

## 2024-05-03 MED ORDER — DEXTROSE 50 % IV SOLN: 0.0000 mL | INTRAVENOUS | Status: DC | PRN

## 2024-05-03 MED ORDER — ACETAMINOPHEN 325 MG PO TABS
650.0000 mg | ORAL_TABLET | Freq: Four times a day (QID) | ORAL | Status: DC | PRN
Start: 2024-05-03 — End: 2024-05-04

## 2024-05-03 MED ORDER — ACETAMINOPHEN 650 MG RE SUPP: 650.0000 mg | Freq: Four times a day (QID) | RECTAL | Status: DC | PRN

## 2024-05-03 MED ORDER — HYDROMORPHONE HCL 1 MG/ML IJ SOLN
0.5000 mg | INTRAMUSCULAR | Status: DC | PRN
  Administered 2024-05-03: 0.5 mg via INTRAVENOUS
  Filled 2024-05-03: qty 0.5

## 2024-05-03 MED ORDER — LACTATED RINGERS IV SOLN: INTRAVENOUS | Status: DC

## 2024-05-03 MED ORDER — INSULIN ASPART 100 UNIT/ML IJ SOLN
0.0000 [IU] | INTRAMUSCULAR | Status: DC
  Administered 2024-05-03 (×2): 3 [IU] via SUBCUTANEOUS
  Administered 2024-05-04: 11 [IU] via SUBCUTANEOUS
  Administered 2024-05-04: 3 [IU] via SUBCUTANEOUS
  Filled 2024-05-03: qty 4
  Filled 2024-05-03: qty 3
  Filled 2024-05-03: qty 4
  Filled 2024-05-03: qty 11

## 2024-05-03 MED ORDER — SODIUM CHLORIDE 0.9 % IV BOLUS
1000.0000 mL | Freq: Once | INTRAVENOUS | Status: AC
  Administered 2024-05-03: 1000 mL via INTRAVENOUS

## 2024-05-03 MED ORDER — ONDANSETRON HCL 4 MG/2ML IJ SOLN
4.0000 mg | Freq: Four times a day (QID) | INTRAMUSCULAR | Status: DC | PRN
  Administered 2024-05-05: 4 mg via INTRAVENOUS
  Filled 2024-05-03: qty 2

## 2024-05-03 MED ORDER — ONDANSETRON HCL 4 MG/2ML IJ SOLN
4.0000 mg | Freq: Once | INTRAMUSCULAR | Status: AC
  Administered 2024-05-03: 4 mg via INTRAVENOUS
  Filled 2024-05-03: qty 2

## 2024-05-03 MED ORDER — HYDROMORPHONE HCL 1 MG/ML IJ SOLN
1.0000 mg | INTRAMUSCULAR | Status: DC | PRN
  Administered 2024-05-03 – 2024-05-04 (×2): 1 mg via INTRAVENOUS
  Filled 2024-05-03 (×3): qty 1

## 2024-05-03 MED ORDER — DEXTROSE IN LACTATED RINGERS 5 % IV SOLN: INTRAVENOUS | Status: DC

## 2024-05-03 MED ORDER — SODIUM CHLORIDE 0.9 % IV SOLN
3.0000 g | Freq: Once | INTRAVENOUS | Status: AC
  Administered 2024-05-03: 3 g via INTRAVENOUS
  Filled 2024-05-03: qty 8

## 2024-05-03 MED ORDER — VANCOMYCIN HCL IN DEXTROSE 1-5 GM/200ML-% IV SOLN
1000.0000 mg | Freq: Once | INTRAVENOUS | Status: AC
  Administered 2024-05-03: 1000 mg via INTRAVENOUS
  Filled 2024-05-03: qty 200

## 2024-05-03 MED ORDER — ONDANSETRON HCL 4 MG PO TABS: 4.0000 mg | ORAL_TABLET | Freq: Four times a day (QID) | ORAL | Status: DC | PRN

## 2024-05-03 MED ORDER — MORPHINE SULFATE (PF) 4 MG/ML IV SOLN
4.0000 mg | Freq: Once | INTRAVENOUS | Status: AC
  Administered 2024-05-03: 4 mg via INTRAVENOUS
  Filled 2024-05-03: qty 1

## 2024-05-03 MED ORDER — POLYETHYLENE GLYCOL 3350 17 G PO PACK: 17.0000 g | PACK | Freq: Every day | ORAL | Status: DC | PRN

## 2024-05-03 MED ORDER — SODIUM CHLORIDE 0.9 % IV SOLN: INTRAVENOUS | Status: AC

## 2024-05-03 MED ORDER — INSULIN REGULAR(HUMAN) IN NACL 100-0.9 UT/100ML-% IV SOLN
INTRAVENOUS | Status: DC
  Administered 2024-05-03: 16 [IU]/h via INTRAVENOUS
  Filled 2024-05-03: qty 100

## 2024-05-03 NOTE — ED Notes (Signed)
 I stat lactic Acid given to MD and RN.   2.45

## 2024-05-03 NOTE — Progress Notes (Signed)
  Subjective Patient ID: Bianca Yang is a 48 y.o. female.  Chief Complaint  Patient presents with  . Cellulitis    Patient presents with redness and swelling in the left foot and lower leg. Patient reports pain in same.     The following information was reviewed by members of the visit team:  Tobacco  Allergies  Meds  Problems  Med Hx  Surg Hx  OB Status   Fam Hx      Pt is a 48 yo here for left foot swelling, redness, pain, ongoing for several days; has black eschar area on lateral foot as well, denies injury. Foot is tender to touch; she states nothing makes this worse/better. Denies fever or other symptoms     Review of Systems  Musculoskeletal:  Positive for arthralgias.  Skin:  Positive for wound.  All other systems reviewed and are negative.   Objective Physical Exam Vitals and nursing note reviewed.  Constitutional:      General: She is not in acute distress.    Appearance: She is normal weight.  HENT:     Head: Normocephalic.     Nose: Nose normal.     Mouth/Throat:     Mouth: Mucous membranes are dry.  Eyes:     Extraocular Movements: Extraocular movements intact.     Pupils: Pupils are equal, round, and reactive to light.  Pulmonary:     Effort: Pulmonary effort is normal.  Musculoskeletal:        General: Tenderness (left foot, diffuse) present. Normal range of motion.  Skin:    General: Skin is warm.     Capillary Refill: Capillary refill takes less than 2 seconds.     Findings: Abrasion (large eschar covered wound on left lateral foot 4x4) and erythema (left foot, diffuse) present.  Neurological:     General: No focal deficit present.     Mental Status: She is alert and oriented to person, place, and time.  Psychiatric:        Mood and Affect: Mood normal.        Behavior: Behavior normal.     Assessment/Plan Diagnoses and all orders for this visit:  Cellulitis of left foot -     XR Foot Minimum 3 Views Left    DDX: Cellulitis,  abscess, hidradenitis, staph infection, folliculitis, dermatitis,   MDM: Pt here for foot pain, redness, xray showing gas, concern for gangrene given physical exam and xray findings, advised ER now for further evaluation and work up; discussed with pt and she is agreeable, offered EMS however pt states will go POV. Risks/benefits discussed, pt stable at departure.   Electronically signed: Rexene Charlies Pouch, NP 05/03/2024  3:52 PM

## 2024-05-03 NOTE — ED Triage Notes (Signed)
 Pt reports with left foot swelling and redness x 1 week.

## 2024-05-03 NOTE — ED Provider Notes (Signed)
 [place] EMERGENCY DEPARTMENT AT [facility] Provider Note   CSN: [id number] Arrival date & time: 05/03/24  1353     Patient presents with: Foot Pain   Bianca Yang is a 48 y.o. female.   48 yo F with a chief complaints of left foot pain.  Been going on for about a week or 2.  No fevers or chills.  Very painful.  Denies injury.   Foot Pain       Prior to Admission medications   Medication Sig Start Date End Date Taking? Authorizing Provider  blood glucose meter kit and supplies KIT Dispense based on patient and insurance preference. Use up to four times daily as directed. (FOR ICD-9 250.00, 250.01). 01/20/20   Drusilla Sabas RAMAN, MD  insulin  detemir (LEVEMIR ) 100 UNIT/ML injection Inject 0.5 mLs (50 Units total) into the skin at bedtime. 01/20/20   Drusilla Sabas RAMAN, MD  Insulin  Syringe-Needle U-100 (INSULIN  SYRINGE .5CC/31GX5/16) 31G X 5/16 0.5 ML MISC Use as directed 01/20/20   Drusilla Sabas RAMAN, MD  metFORMIN  (GLUCOPHAGE -XR) 500 MG 24 hr tablet 2 daily with breakfast 01/20/20   Drusilla Sabas RAMAN, MD  metroNIDAZOLE  (FLAGYL ) 500 MG tablet Take 1 tablet (500 mg total) by mouth 2 (two) times daily. 01/20/20   Drusilla Sabas RAMAN, MD    Allergies: Sulfa antibiotics    Review of Systems  Updated Vital Signs BP 137/79 (BP Location: Left Arm)   Pulse 99   Temp 98.7 F (37.1 C) (Oral)   Resp 18   SpO2 95%   Physical Exam Vitals and nursing note reviewed.  Constitutional:      General: She is not in acute distress.    Appearance: She is well-developed. She is not diaphoretic.  HENT:     Head: Normocephalic and atraumatic.  Eyes:     Pupils: Pupils are equal, round, and reactive to light.  Cardiovascular:     Rate and Rhythm: Normal rate and regular rhythm.     Heart sounds: No murmur heard.    No friction rub. No gallop.  Pulmonary:     Effort: Pulmonary effort is normal.     Breath sounds: No wheezing or rales.  Abdominal:     General: There is no distension.      Palpations: Abdomen is soft.     Tenderness: There is no abdominal tenderness.  Musculoskeletal:        General: No tenderness.     Cervical back: Normal range of motion and neck supple.  Skin:    General: Skin is warm and dry.     Findings: Erythema present.     Comments: Erythema with some tracking proximally.  Intact pulses.  Limited exam due to discomfort.  Fairly large ulcer along the base of the fifth metatarsal.  No obvious crepitus or fluctuance.  Neurological:     Mental Status: She is alert and oriented to person, place, and time.  Psychiatric:        Behavior: Behavior normal.     (all labs ordered are listed, but only abnormal results are displayed) Labs Reviewed  CBC WITH DIFFERENTIAL/PLATELET - Abnormal; Notable for the following components:      Result Value   WBC 13.3 (*)    MCHC 29.3 (*)    Neutro Abs 10.4 (*)    Abs Immature Granulocytes 0.09 (*)    All other components within normal limits  COMPREHENSIVE METABOLIC PANEL WITH GFR - Abnormal; Notable for the following components:  Sodium 133 (*)    Chloride 90 (*)    CO2 33 (*)    Glucose, Bld 534 (*)    Alkaline Phosphatase 158 (*)    All other components within normal limits  I-STAT CG4 LACTIC ACID, ED - Abnormal; Notable for the following components:   Lactic Acid, Venous 2.5 (*)    All other components within normal limits  I-STAT CG4 LACTIC ACID, ED - Abnormal; Notable for the following components:   Lactic Acid, Venous 2.2 (*)    All other components within normal limits  CULTURE, BLOOD (ROUTINE X 2)  CULTURE, BLOOD (ROUTINE X 2)  HCG, SERUM, QUALITATIVE  URINALYSIS, ROUTINE W REFLEX MICROSCOPIC  CBG MONITORING, ED    EKG: None  Radiology: No results found.   .Critical Care  Performed by: Emil Share, DO Authorized by: Emil Share, DO   Critical care provider statement:    Critical care time (minutes):  35   Critical care time was exclusive of:  Separately billable procedures and  treating other patients   Critical care was time spent personally by me on the following activities:  Development of treatment plan with patient or surrogate, discussions with consultants, evaluation of patient's response to treatment, examination of patient, ordering and review of laboratory studies, ordering and review of radiographic studies, ordering and performing treatments and interventions, pulse oximetry, re-evaluation of patient's condition and review of old charts   Care discussed with: admitting provider      Medications Ordered in the ED  insulin  regular, human (MYXREDLIN) 100 units/ 100 mL infusion (16 Units/hr Intravenous New Bag/Given 05/03/24 1615)  lactated ringers infusion ( Intravenous New Bag/Given 05/03/24 1617)  dextrose  5 % in lactated ringers infusion (0 mLs Intravenous Hold 05/03/24 1645)  dextrose  50 % solution 0-50 mL (has no administration in time range)  Ampicillin -Sulbactam (UNASYN ) 3 g in sodium chloride  0.9 % 100 mL IVPB (3 g Intravenous New Bag/Given 05/03/24 1630)    And  vancomycin  (VANCOCIN ) IVPB 1000 mg/200 mL premix (0 mg Intravenous Hold 05/03/24 1646)  morphine  (PF) 4 MG/ML injection 4 mg (4 mg Intravenous Given 05/03/24 1644)  ondansetron  (ZOFRAN ) injection 4 mg (4 mg Intravenous Given 05/03/24 1645)                                    Medical Decision Making Amount and/or Complexity of Data Reviewed Labs: ordered. Radiology: ordered.  Risk Prescription drug management.   48 yo F with a chief complaints of left foot pain.  Patient likely with osteomyelitis based on exam.  Lactate 2.5 initially.  Leukocytosis.  No reported fevers at home.  Will start on IV antibiotics.  Discussed with orthopedics recommended admission to: And will be seen in the morning.  Patient with hyperglycemia.  Will start on insulin  infusion.  Will discuss with medicine.  The patients results and plan were reviewed and discussed.   Any x-rays performed were independently  reviewed by myself.   Differential diagnosis were considered with the presenting HPI.  Medications  insulin  regular, human (MYXREDLIN) 100 units/ 100 mL infusion (16 Units/hr Intravenous New Bag/Given 05/03/24 1615)  lactated ringers infusion ( Intravenous New Bag/Given 05/03/24 1617)  dextrose  5 % in lactated ringers infusion (0 mLs Intravenous Hold 05/03/24 1645)  dextrose  50 % solution 0-50 mL (has no administration in time range)  Ampicillin -Sulbactam (UNASYN ) 3 g in sodium chloride  0.9 % 100 mL IVPB (3  g Intravenous New Bag/Given 05/03/24 1630)    And  vancomycin  (VANCOCIN ) IVPB 1000 mg/200 mL premix (0 mg Intravenous Hold 05/03/24 1646)  morphine  (PF) 4 MG/ML injection 4 mg (4 mg Intravenous Given 05/03/24 1644)  ondansetron  (ZOFRAN ) injection 4 mg (4 mg Intravenous Given 05/03/24 1645)    Vitals:   05/03/24 1402  BP: 137/79  Pulse: 99  Resp: 18  Temp: 98.7 F (37.1 C)  TempSrc: Oral  SpO2: 95%    Final diagnoses:  Diabetic foot infection (HCC)  Hyperglycemia    Admission/ observation were discussed with the admitting physician, patient and/or family and they are comfortable with the plan.       Final diagnoses:  Diabetic foot infection [facility])  Hyperglycemia    ED Discharge Orders     None          Emil Share, DO 05/03/24 1646

## 2024-05-03 NOTE — H&P (Addendum)
 History and Physical    NOTNAMED CROUCHER FMW:[id number] DOB: [birth year] DOA: 05/03/2024  PCP: Patient, [name] Per   Patient coming from:Home  I have personally briefly reviewed patient's old medical records in [facility]  Chief Complaint: Left leg ulcer  HPI: Bianca Yang is a 48 y.o. female with medical history significant for cocaine abuse, osteomyelitis, diabetes mellitus. Patient presented to the ED with complaints of an ulcer, with pain, swelling and redness to her left foot that started about 2 weeks ago.  She reports significant pain.  No fevers no chills.  No vomiting.  No urinary symptoms.  No diarrhea.  She has not been compliant with her diabetic medication for several days.  ED Course: Temperature 98.7.  Heart rate 66-99.  Respirate rate 18-20.  Blood pressure systolic 112-137.  O2 sat greater 95% on room air. WBC 13.3. Blood glucose 534, anion gap of 33, serum bicarb of 10. X-ray of the left foot- Findings suggesting soft tissue infection/cellulitis over the lateral aspect of the foot with subtle lucency and ill definition of the base of the fifth metatarsal suggesting osteomyelitis. IV Unasyn , vancomycin  started.  Insulin  drip started.  LR at 125 cc/h.  EDP talked to Bianca Yang on-call for Bianca Yang, in case surgical intervention is needed..  Review of Systems: As per HPI all other systems reviewed and negative.  Past Medical History:  Diagnosis Date   Diabetes mellitus without complication (HCC)     Past Surgical History:  Procedure Laterality Date   TUBAL LIGATION       reports that she has been smoking cigarettes. She has never used smokeless tobacco. She reports current alcohol use. She reports that she does not use drugs.  Allergies  Allergen Reactions   Sulfa Antibiotics Other (See Comments)    Unknown/ childhood allergy.     Family History  Problem Relation Age of Onset   Diabetes Mother     Prior to Admission  medications   Medication Sig Start Date End Date Taking? Authorizing Provider  blood glucose meter kit and supplies KIT Dispense based on patient and insurance preference. Use up to four times daily as directed. (FOR ICD-9 250.00, 250.01). 01/20/20   Bianca Sabas RAMAN, Bianca Yang  insulin  detemir (LEVEMIR ) 100 UNIT/ML injection Inject 0.5 mLs (50 Units total) into the skin at bedtime. 01/20/20   Bianca Sabas RAMAN, Bianca Yang  Insulin  Syringe-Needle U-100 (INSULIN  SYRINGE .5CC/31GX5/16) 31G X 5/16 0.5 ML MISC Use as directed 01/20/20   Bianca Sabas RAMAN, Bianca Yang  metFORMIN  (GLUCOPHAGE -XR) 500 MG 24 hr tablet 2 daily with breakfast 01/20/20   Bianca Sabas RAMAN, Bianca Yang  metroNIDAZOLE  (FLAGYL ) 500 MG tablet Take 1 tablet (500 mg total) by mouth 2 (two) times daily. 01/20/20   Bianca Sabas RAMAN, Bianca Yang    Physical Exam: Vitals:   05/03/24 1402 05/03/24 1758 05/03/24 1836  BP: 137/79  112/74  Pulse: 99  86  Resp: 18  20  Temp: 98.7 F (37.1 C) 98.3 F (36.8 C) 98 F (36.7 C)  TempSrc: Oral Oral Oral  SpO2: 95%  98%    Constitutional: NAD, calm, comfortable Vitals:   05/03/24 1402 05/03/24 1758 05/03/24 1836  BP: 137/79  112/74  Pulse: 99  86  Resp: 18  20  Temp: 98.7 F (37.1 C) 98.3 F (36.8 C) 98 F (36.7 C)  TempSrc: Oral Oral Oral  SpO2: 95%  98%   Eyes: PERRL, lids and conjunctivae normal ENMT: Mucous membranes  are moist.  Neck: normal, supple, no masses, no thyromegaly Respiratory: clear to auscultation bilaterally, no wheezing, no crackles. Normal respiratory effort. No accessory muscle use.  Cardiovascular: Regular rate and rhythm, no murmurs / rubs / gallops. No extremity edema.  Extremities warm. Abdomen: no tenderness, no masses palpated. No hepatosplenomegaly.  Musculoskeletal: no clubbing / cyanosis. No joint deformity upper and lower extremities. Good ROM, no contractures. Normal muscle tone.  Skin: Ulcer to lateral aspect of left foot, tender to palpation, with redness extending towards ankle Neurologic: No facial  asymmetry, moving extremity spontaneously, speech fluent.  Psychiatric: Normal judgment and insight. Alert and oriented x 3. Normal mood.     Labs on Admission: I have personally reviewed following labs and imaging studies  CBC: Recent Labs  Lab 05/03/24 1415  WBC 13.3*  NEUTROABS 10.4*  HGB 13.0  HCT 44.3  MCV 89.7  PLT 255   Basic Metabolic Panel: Recent Labs  Lab 05/03/24 1415  NA 133*  K 3.8  CL 90*  CO2 33*  GLUCOSE 534*  BUN 17  CREATININE 0.71  CALCIUM  9.3   GFR: CrCl cannot be calculated (Unknown ideal weight.). Liver Function Tests: Recent Labs  Lab 05/03/24 1415  AST 16  ALT 10  ALKPHOS 158*  BILITOT 0.4  PROT 8.1  ALBUMIN 3.5   CBG: Recent Labs  Lab 05/03/24 1713 05/03/24 1836  GLUCAP 280* 187*    Radiological Exams on Admission: DG Foot Complete Left Result Date: 05/03/2024 CLINICAL DATA:  Left foot pain and swelling with redness 1 week. Evaluate for osteomyelitis. EXAM: LEFT FOOT - COMPLETE 3+ VIEW COMPARISON:  11/04/2016 FINDINGS: No evidence of acute fracture or dislocation. Air within the soft tissues over the lateral aspect of the foot at the level of the base of the fifth metatarsal as well as mottled air over the lateral soft tissues adjacent the distal aspect of the fifth metatarsal. The soft tissue changes likely represent soft tissue infection/cellulitis. There is subtle lucency and ill definition of the base of the fifth metatarsal suggesting osteomyelitis. Small vessel atherosclerotic disease is present. Remainder of the exam is unremarkable. IMPRESSION: Findings suggesting soft tissue infection/cellulitis over the lateral aspect of the foot with subtle lucency and ill definition of the base of the fifth metatarsal suggesting osteomyelitis. Electronically Signed   By: Toribio Agreste M.D.   On: 05/03/2024 17:02   EKG: Pending.  Assessment/Plan Principal Problem:   Diabetic foot infection (HCC)    Assessment and Plan:  Diabetic  foot infection-ulcer with cellulitis to left foot.  Not meeting sepsis criteria.  Afebrile.  WBC 13.3.  X-ray of the left foot - findings suggesting soft tissue infection/cellulitis over the lateral aspect of the foot with subtle lucency and ill definition of the base of the fifth metatarsal suggesting osteomyelitis.  History of osteomyelitis involving the fifth proximal phalanx right foot 2021-treated with oral Augmentin  for 6 weeks, patient declined surgery. -IV Vanco and Unasyn  started in ED, she is well-appearing, vitals stable, will hold off on further antibiotics for now pending - Follow-up blood cultures - EDP talked to Ortho on-call for Bianca Yang, admit to Bianca Yang in case surgical intervention is needed - IV Dilaudid  1 every 4 hourly as needed - Vascular ultrasound/ABI ordered  Diabetes mellitus with hyperglycemia-blood glucose 534.  Transiently required insulin  drip in the ED, now discontinued.  Per med list on Levemir  50 and metformin .  Reports noncompliance with her medications, she is unable to tell me exactly  what she takes. - Continue N/s at 100 cc/h for 10 hours - 1 L bolus - SSI- M  DVT prophylaxis: SCDs pending Ortho eval Code Status: Full code Family Communication: Significant other at bedside Disposition Plan: > 2 days Consults called: Orthopedics Admission status: Inpatient, MedSurg I certify that at the point of admission it is my clinical judgment that the patient will require inpatient hospital care spanning beyond 2 midnights from the point of admission due to high intensity of service, high risk for further deterioration and high frequency of surveillance required.     Author: Tully FORBES Carwin, Bianca Yang 05/03/2024 7:12 PM  For on call review [email or web address].

## 2024-05-03 NOTE — ED Notes (Signed)
 [facility] called for transport.

## 2024-05-04 ENCOUNTER — Inpatient Hospital Stay (HOSPITAL_COMMUNITY)

## 2024-05-04 DIAGNOSIS — R52 Pain, unspecified: Secondary | ICD-10-CM

## 2024-05-04 DIAGNOSIS — M7989 Other specified soft tissue disorders: Secondary | ICD-10-CM | POA: Diagnosis not present

## 2024-05-04 DIAGNOSIS — E11628 Type 2 diabetes mellitus with other skin complications: Secondary | ICD-10-CM | POA: Diagnosis not present

## 2024-05-04 DIAGNOSIS — M86472 Chronic osteomyelitis with draining sinus, left ankle and foot: Secondary | ICD-10-CM | POA: Diagnosis not present

## 2024-05-04 DIAGNOSIS — L538 Other specified erythematous conditions: Secondary | ICD-10-CM | POA: Diagnosis not present

## 2024-05-04 DIAGNOSIS — L0390 Cellulitis, unspecified: Secondary | ICD-10-CM

## 2024-05-04 DIAGNOSIS — L02612 Cutaneous abscess of left foot: Secondary | ICD-10-CM | POA: Diagnosis not present

## 2024-05-04 DIAGNOSIS — L089 Local infection of the skin and subcutaneous tissue, unspecified: Secondary | ICD-10-CM | POA: Diagnosis not present

## 2024-05-04 LAB — RAPID URINE DRUG SCREEN, HOSP PERFORMED
Amphetamines: NOT DETECTED
Barbiturates: NOT DETECTED
Benzodiazepines: NOT DETECTED
Cocaine: POSITIVE — AB
Opiates: POSITIVE — AB
Tetrahydrocannabinol: NOT DETECTED

## 2024-05-04 LAB — GLUCOSE, CAPILLARY
Glucose-Capillary: 161 mg/dL — ABNORMAL HIGH (ref 70–99)
Glucose-Capillary: 174 mg/dL — ABNORMAL HIGH (ref 70–99)
Glucose-Capillary: 174 mg/dL — ABNORMAL HIGH (ref 70–99)
Glucose-Capillary: 314 mg/dL — ABNORMAL HIGH (ref 70–99)
Glucose-Capillary: 318 mg/dL — ABNORMAL HIGH (ref 70–99)
Glucose-Capillary: 340 mg/dL — ABNORMAL HIGH (ref 70–99)

## 2024-05-04 LAB — CBC
HCT: 39.6 % (ref 36.0–46.0)
Hemoglobin: 12.2 g/dL (ref 12.0–15.0)
MCH: 27.4 pg (ref 26.0–34.0)
MCHC: 30.8 g/dL (ref 30.0–36.0)
MCV: 89 fL (ref 80.0–100.0)
Platelets: 271 K/uL (ref 150–400)
RBC: 4.45 MIL/uL (ref 3.87–5.11)
RDW: 13.1 % (ref 11.5–15.5)
WBC: 16.6 K/uL — ABNORMAL HIGH (ref 4.0–10.5)
nRBC: 0 % (ref 0.0–0.2)

## 2024-05-04 LAB — HEMOGLOBIN A1C
Hgb A1c MFr Bld: 11.6 % — ABNORMAL HIGH (ref 4.8–5.6)
Mean Plasma Glucose: 286.22 mg/dL

## 2024-05-04 LAB — BASIC METABOLIC PANEL WITH GFR
Anion gap: 8 (ref 5–15)
BUN: 13 mg/dL (ref 6–20)
CO2: 34 mmol/L — ABNORMAL HIGH (ref 22–32)
Calcium: 8.3 mg/dL — ABNORMAL LOW (ref 8.9–10.3)
Chloride: 94 mmol/L — ABNORMAL LOW (ref 98–111)
Creatinine, Ser: 0.58 mg/dL (ref 0.44–1.00)
GFR, Estimated: >60 mL/min (ref >60)
Glucose, Bld: 141 mg/dL — ABNORMAL HIGH (ref 70–99)
Potassium: 4.6 mmol/L (ref 3.5–5.1)
Sodium: 136 mmol/L (ref 135–145)

## 2024-05-04 LAB — VAS US ABI WITH/WO TBI: Right ABI: 1.11

## 2024-05-04 LAB — SURGICAL PCR SCREEN
MRSA, PCR: NEGATIVE
Staphylococcus aureus: POSITIVE — AB

## 2024-05-04 LAB — HIV ANTIBODY (ROUTINE TESTING W REFLEX): HIV Screen 4th Generation wRfx: NONREACTIVE

## 2024-05-04 MED ORDER — INSULIN ASPART 100 UNIT/ML IJ SOLN
0.0000 [IU] | Freq: Three times a day (TID) | INTRAMUSCULAR | Status: DC
  Administered 2024-05-04 (×2): 11 [IU] via SUBCUTANEOUS
  Administered 2024-05-05: 8 [IU] via SUBCUTANEOUS
  Administered 2024-05-06: 11 [IU] via SUBCUTANEOUS
  Administered 2024-05-06 – 2024-05-07 (×2): 15 [IU] via SUBCUTANEOUS
  Administered 2024-05-07: 5 [IU] via SUBCUTANEOUS
  Administered 2024-05-08: 3 [IU] via SUBCUTANEOUS
  Administered 2024-05-08 – 2024-05-09 (×3): 5 [IU] via SUBCUTANEOUS
  Filled 2024-05-04: qty 3
  Filled 2024-05-04: qty 15
  Filled 2024-05-04: qty 8
  Filled 2024-05-04: qty 11
  Filled 2024-05-04: qty 5
  Filled 2024-05-04: qty 11
  Filled 2024-05-04 (×3): qty 5
  Filled 2024-05-04: qty 15
  Filled 2024-05-04: qty 5

## 2024-05-04 MED ORDER — MUPIROCIN 2 % EX OINT
1.0000 | TOPICAL_OINTMENT | Freq: Two times a day (BID) | CUTANEOUS | Status: AC
  Administered 2024-05-04 – 2024-05-08 (×10): 1 via NASAL
  Filled 2024-05-04 (×2): qty 22

## 2024-05-04 MED ORDER — SODIUM CHLORIDE 0.9 % IV SOLN: 3.0000 g | Freq: Three times a day (TID) | INTRAVENOUS | Status: DC

## 2024-05-04 MED ORDER — OXYCODONE HCL 5 MG PO TABS
5.0000 mg | ORAL_TABLET | ORAL | Status: DC | PRN
  Administered 2024-05-04 – 2024-05-06 (×9): 5 mg via ORAL
  Filled 2024-05-04 (×9): qty 1

## 2024-05-04 MED ORDER — LIVING WELL WITH DIABETES BOOK
Freq: Once | Status: AC
  Filled 2024-05-04: qty 1

## 2024-05-04 MED ORDER — CHLORHEXIDINE GLUCONATE CLOTH 2 % EX PADS
6.0000 | MEDICATED_PAD | Freq: Every day | CUTANEOUS | Status: AC
  Administered 2024-05-04 – 2024-05-08 (×5): 6 via TOPICAL

## 2024-05-04 MED ORDER — ACETAMINOPHEN 500 MG PO TABS
1000.0000 mg | ORAL_TABLET | Freq: Four times a day (QID) | ORAL | Status: DC
  Administered 2024-05-04 – 2024-05-09 (×19): 1000 mg via ORAL
  Filled 2024-05-04 (×21): qty 2

## 2024-05-04 MED ORDER — SODIUM CHLORIDE 0.9 % IV SOLN: INTRAVENOUS | Status: AC

## 2024-05-04 MED ORDER — METFORMIN HCL 500 MG PO TABS
500.0000 mg | ORAL_TABLET | Freq: Two times a day (BID) | ORAL | Status: DC
  Administered 2024-05-04 – 2024-05-06 (×5): 500 mg via ORAL
  Filled 2024-05-04 (×5): qty 1

## 2024-05-04 MED ORDER — VANCOMYCIN HCL 750 MG/150ML IV SOLN
750.0000 mg | Freq: Two times a day (BID) | INTRAVENOUS | Status: DC
  Administered 2024-05-04 – 2024-05-07 (×8): 750 mg via INTRAVENOUS
  Filled 2024-05-04 (×9): qty 150

## 2024-05-04 MED ORDER — VANCOMYCIN HCL IN DEXTROSE 1-5 GM/200ML-% IV SOLN: 1000.0000 mg | INTRAVENOUS | Status: DC

## 2024-05-04 MED ORDER — SODIUM CHLORIDE 0.9 % IV SOLN
3.0000 g | Freq: Four times a day (QID) | INTRAVENOUS | Status: DC
  Administered 2024-05-04 – 2024-05-08 (×16): 3 g via INTRAVENOUS
  Filled 2024-05-04 (×16): qty 8

## 2024-05-04 MED ORDER — ACETAMINOPHEN 500 MG PO TABS: 1000.0000 mg | ORAL_TABLET | Freq: Four times a day (QID) | ORAL | Status: DC

## 2024-05-04 MED ORDER — ACETAMINOPHEN 650 MG RE SUPP: 650.0000 mg | Freq: Four times a day (QID) | RECTAL | Status: DC

## 2024-05-04 MED ORDER — NAPROXEN 250 MG PO TABS
500.0000 mg | ORAL_TABLET | Freq: Two times a day (BID) | ORAL | Status: DC
  Administered 2024-05-04 – 2024-05-09 (×11): 500 mg via ORAL
  Filled 2024-05-04 (×11): qty 2

## 2024-05-04 MED ORDER — VANCOMYCIN HCL IN DEXTROSE 750-5 MG/150ML-% IV SOLN
750.0000 mg | Freq: Two times a day (BID) | INTRAVENOUS | Status: DC
  Filled 2024-05-04 (×3): qty 150

## 2024-05-04 NOTE — Inpatient Diabetes Management (Addendum)
 Inpatient Diabetes Program Recommendations  AACE/ADA: New Consensus Statement on Inpatient Glycemic Control   Target Ranges:  Prepandial:   less than 140 mg/dL      Peak postprandial:   less than 180 mg/dL (1-2 hours)      Critically ill patients:  140 - 180 mg/dL    Latest Reference Range & Units 05/03/24 17:13 05/03/24 18:36 05/03/24 20:15 05/04/24 00:16 05/04/24 04:34  Glucose-Capillary 70 - 99 mg/dL 719 (H) 812 (H) 813 (H) 314 (H) 161 (H)    Latest Reference Range & Units 05/03/24 14:15  CO2 22 - 32 mmol/L 33 (H)  Glucose 70 - 99 mg/dL 465 (HH)  Anion gap 5 - 15  10    Latest Reference Range & Units 05/03/24 14:24 05/03/24 16:12  Lactic Acid, Venous 0.5 - 1.9 mmol/L 2.5 (HH) 2.2 (HH)    Latest Reference Range & Units 01/16/20 06:10 05/04/24 03:21  Hemoglobin A1C 4.8 - 5.6 % 12.0 (H) 11.6 (H)   Review of Glycemic Control  Diabetes history: DM2 Outpatient Diabetes medications: Levemir  50 units at bedtime, Metfomrin XR 1000 mg QAM Current orders for Inpatient glycemic control: Novolog  0-15 units Q4H  Inpatient Diabetes Program Recommendations:    Insulin : Please consider ordering Semglee 10 units Q24H to start now.  HbgA1C:  A1C 11.6% on 05/04/24 indicating an average glucose of 286 mg/dl over the past 2-3 months.  Addendum 05/04/24@14 :59-Inpatient diabetes coordinator working from [employer] today. Tried calling patient's room x3 and cell phone x3 but no answer. Called [facility] nursing desk and patient's nurse reports patient has been sleeping a lot today but nurse will go be sure patient's phone is close to patient so she can reach it. Called patient's room back but still no answer.  Will ask onsite diabetes coordinator to follow up with patient tomorrow regarding DM control.  Thanks, Earnie Gainer, RN, MSN, CDCES Diabetes Coordinator Inpatient Diabetes Program [phone] (Team Pager from 8am to 5pm)

## 2024-05-04 NOTE — Progress Notes (Signed)
 [facility]   ROUNDING   NOTE Bianca Yang FMW:[id number]  DOB: 1975-08-16  DOA: 05/03/2024  PCP: Patient, [name] Per  05/04/2024,6:35 AM  LOS: 1 day    Code Status:  Full code     From:  home    48 year old white female DM TY 2, previous PID, STD-cocaine abuse Previous admission 01/15/2020 through 01/20/2020 osteomyelitis fifth proximal digit managed conservatively with ID and orthopedic input Return to hospital 05/03/2024 left foot pain erythema tracking proximally intact pulses large ulcer along base of fifth metatarsal  Chronology  11/5 presented to [facility] with above complaints transferred to [facility] for Dr. Harden to see-in mild DKA insulin  drip started and rapidly discontinued-noncompliant on meds apparently    Pertinent imaging/studies till date  Glucose 534 gap 33 bicarb 10 WBC 13 vitals relatively stable other than heart rate in the 100s Left foot x-ray subtle lucency ill-definition of base of fifth metatarsal suggesting osteomyelitis   Assessment  & Plan :    Cellulitis lower extremity Pictures below indicative of purulent process-currently Unasyn  and vancomycin  which we will continue-follow BCx 2 Change saline to 50 cc/H Pain control Tylenol  scheduled 1000 twice daily, Naprosyn  500 twice daily and for severe pain Oxy IR 5 every 4 as needed >pain level 10 Surgery planned for 11/7  DKA on admission now resolved Uncontrolled diabetes mellitus A1c 11.6 secondary to noncompliance-apparently supposed be on metformin  as well as Levemir  50 Continue sliding scale Q ID AC n.p.o. after midnight--- resume metformin  500 twice daily as renal Function can support this Saline 50 cc/H as above Mild metabolic alkalosis dating back to 2021 Etiology unclear just trend at this time PID previously with other STDs H/o Previous cocaine abuse Follow drug screen No narcotics on discharge  Data Reviewed today:  Sodium 136 potassium 4.6 chloride 94 bicarb is 34 BUN/creatinine 13/0.5 WBC 16.6  hemoglobin 12.2   DVT prophylaxis: SCD  Status is: Inpatient Inpatient need surgery    Dispo/Global plan: Unclear at this time   Time 20   Subjective:   Does not want to be disturbed Main focus is that she is hungry    Objective + exam Vitals:   05/03/24 1950 05/04/24 0014 05/04/24 0500 05/04/24 0541  BP: (!) 155/92 128/82    Pulse: 100 (!) 106    Resp: 20 20    Temp: 98.2 F (36.8 C) 98 F (36.7 C)    TempSrc: Oral Oral    SpO2: 95% 90% (!) 88% 96%   There were no vitals filed for this visit.   Examination: EOMI NCAT no focal deficit S1-S2 no murmur Foot exam as above in picture    Scheduled Meds:  acetaminophen   1,000 mg Oral Q6H   insulin  aspart  0-15 Units Subcutaneous TID WC   living well with diabetes book   Does not apply Once   metFORMIN   500 mg Oral BID WC   naproxen   500 mg Oral BID WC   Continuous Infusions: acetaminophen  **OR** acetaminophen , HYDROmorphone  (DILAUDID ) injection, ondansetron  **OR** ondansetron  (ZOFRAN ) IV, polyethylene glycol  Jai-Gurmukh Krista Som, MD  [facility]

## 2024-05-04 NOTE — Consult Note (Signed)
 ORTHOPAEDIC CONSULTATION  REQUESTING PHYSICIAN: Samtani, Jai-Gurmukh, MD  Chief Complaint: Ulceration left foot with cellulitis  HPI: Bianca Yang is a 48 y.o. female who presents with uncontrolled type 2 diabetes with a several week history of ulceration cellulitis lateral aspect of the left foot.  Past Medical History:  Diagnosis Date   Diabetes mellitus without complication (HCC)    Past Surgical History:  Procedure Laterality Date   TUBAL LIGATION     Social History   Socioeconomic History   Marital status: Single    Spouse name: Not on file   Number of children: Not on file   Years of education: Not on file   Highest education level: Not on file  Occupational History   Not on file  Tobacco Use   Smoking status: Every Day    Current packs/day: 0.50    Types: Cigarettes   Smokeless tobacco: Never  Substance and Sexual Activity   Alcohol use: Yes    Comment: socially   Drug use: No   Sexual activity: Yes  Other Topics Concern   Not on file  Social History Narrative   Not on file   Social Drivers of Health   Financial Resource Strain: Not on file  Food Insecurity: Not on file  Transportation Needs: Not on file  Physical Activity: Not on file  Stress: Not on file  Social Connections: Not on file   Family History  Problem Relation Age of Onset   Diabetes Mother    - negative except otherwise stated in the family history section Allergies  Allergen Reactions   Sulfa Antibiotics Other (See Comments)    Unknown/ childhood allergy.    Prior to Admission medications   Medication Sig Start Date End Date Taking? Authorizing Provider  blood glucose meter kit and supplies KIT Dispense based on patient and insurance preference. Use up to four times daily as directed. (FOR ICD-9 250.00, 250.01). 01/20/20   Drusilla Sabas RAMAN, MD  insulin  detemir (LEVEMIR ) 100 UNIT/ML injection Inject 0.5 mLs (50 Units total) into the skin at bedtime. 01/20/20   Drusilla Sabas RAMAN,  MD  Insulin  Syringe-Needle U-100 (INSULIN  SYRINGE .5CC/31GX5/16) 31G X 5/16 0.5 ML MISC Use as directed 01/20/20   Drusilla Sabas RAMAN, MD  metFORMIN  (GLUCOPHAGE -XR) 500 MG 24 hr tablet 2 daily with breakfast 01/20/20   Drusilla Sabas RAMAN, MD  metroNIDAZOLE  (FLAGYL ) 500 MG tablet Take 1 tablet (500 mg total) by mouth 2 (two) times daily. 01/20/20   Drusilla Sabas RAMAN, MD   DG Foot Complete Left Result Date: 05/03/2024 CLINICAL DATA:  Left foot pain and swelling with redness 1 week. Evaluate for osteomyelitis. EXAM: LEFT FOOT - COMPLETE 3+ VIEW COMPARISON:  11/04/2016 FINDINGS: No evidence of acute fracture or dislocation. Air within the soft tissues over the lateral aspect of the foot at the level of the base of the fifth metatarsal as well as mottled air over the lateral soft tissues adjacent the distal aspect of the fifth metatarsal. The soft tissue changes likely represent soft tissue infection/cellulitis. There is subtle lucency and ill definition of the base of the fifth metatarsal suggesting osteomyelitis. Small vessel atherosclerotic disease is present. Remainder of the exam is unremarkable. IMPRESSION: Findings suggesting soft tissue infection/cellulitis over the lateral aspect of the foot with subtle lucency and ill definition of the base of the fifth metatarsal suggesting osteomyelitis. Electronically Signed   By: Toribio Agreste M.D.   On: 05/03/2024 17:02   - pertinent xrays, CT, MRI  studies were reviewed and independently interpreted  Positive ROS: All other systems have been reviewed and were otherwise negative with the exception of those mentioned in the HPI and as above.  Physical Exam: General: Alert, no acute distress Psychiatric: Patient is competent for consent with normal mood and affect Lymphatic: No axillary or cervical lymphadenopathy Cardiovascular: No pedal edema Respiratory: No cyanosis, no use of accessory musculature GI: No organomegaly, abdomen is soft and  non-tender    Images:  @ENCIMAGES @  Labs:  Lab Results  Component Value Date   HGBA1C 11.6 (H) 05/04/2024   HGBA1C 12.0 (H) 01/16/2020   HGBA1C 11.4 (H) 11/04/2016   ESRSEDRATE 10 11/04/2016   CRP <0.8 11/04/2016   REPTSTATUS PENDING 05/03/2024   CULT  05/03/2024    NO GROWTH < 12 HOURS Performed at [facility], [address] [place], [place] [address]     Lab Results  Component Value Date   ALBUMIN 3.5 05/03/2024   ALBUMIN 3.3 (L) 07/04/2017   ALBUMIN 3.7 02/24/2017        Latest Ref Rng & Units 05/04/2024    3:21 AM 05/03/2024    2:15 PM 01/20/2020    5:07 AM  CBC EXTENDED  WBC 4.0 - 10.5 K/uL 16.6  13.3  10.8   RBC 3.87 - 5.11 MIL/uL 4.45  4.94  4.87   Hemoglobin 12.0 - 15.0 g/dL 87.7  86.9  87.0   HCT 36.0 - 46.0 % 39.6  44.3  43.2   Platelets 150 - 400 K/uL 271  255  286   NEUT# 1.7 - 7.7 K/uL  10.4    Lymph# 0.7 - 4.0 K/uL  1.5      Neurologic: Patient does not have protective sensation bilateral lower extremities.   MUSCULOSKELETAL:   Skin: Normal for examination patient has cellulitis involving three quarters of the left foot.  There is a necrotic ulcer at the base of the fifth metatarsal.  Review of the radiographs shows destructive bony changes at the base of the fifth metatarsal consistent with chronic osteomyelitis and there is air and soft tissue along the lateral aspect of her foot.  Patient has a palpable dorsalis pedis pulse.  We will need to get an ABI to obtain a baseline with her uncontrolled diabetes.  White cell count 16.6 with a hemoglobin of 12.2.  Hemoglobin A1c 11.6 consistently greater than 11 sleep for the past 7 years.  Patient states that she has not been taking her insulin .  Assessment: Assessment: Abscess and osteomyelitis lateral aspect of the left foot.  Plan: Plan: Will plan for 1/5 ray amputation tomorrow.  Discussed with the patient there may be extensive soft tissue infection  and may require additional  surgical debridement.  Will need to get an ankle-brachial indices for baseline secondary to her prolonged Uncontrolled type 2 diabetes.  Thank you for the consult and the opportunity to see Ms. Bianca Jerona Sage, MD [facility] [phone] 8:21 AM

## 2024-05-04 NOTE — H&P (View-Only) (Signed)
 ORTHOPAEDIC CONSULTATION  REQUESTING PHYSICIAN: Samtani, Jai-Gurmukh, MD  Chief Complaint: Ulceration left foot with cellulitis  HPI: Bianca Yang is a 48 y.o. female who presents with uncontrolled type 2 diabetes with a several week history of ulceration cellulitis lateral aspect of the left foot.  Past Medical History:  Diagnosis Date   Diabetes mellitus without complication (HCC)    Past Surgical History:  Procedure Laterality Date   TUBAL LIGATION     Social History   Socioeconomic History   Marital status: Single    Spouse name: Not on file   Number of children: Not on file   Years of education: Not on file   Highest education level: Not on file  Occupational History   Not on file  Tobacco Use   Smoking status: Every Day    Current packs/day: 0.50    Types: Cigarettes   Smokeless tobacco: Never  Substance and Sexual Activity   Alcohol use: Yes    Comment: socially   Drug use: No   Sexual activity: Yes  Other Topics Concern   Not on file  Social History Narrative   Not on file   Social Drivers of Health   Financial Resource Strain: Not on file  Food Insecurity: Not on file  Transportation Needs: Not on file  Physical Activity: Not on file  Stress: Not on file  Social Connections: Not on file   Family History  Problem Relation Age of Onset   Diabetes Mother    - negative except otherwise stated in the family history section Allergies  Allergen Reactions   Sulfa Antibiotics Other (See Comments)    Unknown/ childhood allergy.    Prior to Admission medications   Medication Sig Start Date End Date Taking? Authorizing Provider  blood glucose meter kit and supplies KIT Dispense based on patient and insurance preference. Use up to four times daily as directed. (FOR ICD-9 250.00, 250.01). 01/20/20   Drusilla Sabas RAMAN, MD  insulin  detemir (LEVEMIR ) 100 UNIT/ML injection Inject 0.5 mLs (50 Units total) into the skin at bedtime. 01/20/20   Drusilla Sabas RAMAN,  MD  Insulin  Syringe-Needle U-100 (INSULIN  SYRINGE .5CC/31GX5/16) 31G X 5/16 0.5 ML MISC Use as directed 01/20/20   Drusilla Sabas RAMAN, MD  metFORMIN  (GLUCOPHAGE -XR) 500 MG 24 hr tablet 2 daily with breakfast 01/20/20   Drusilla Sabas RAMAN, MD  metroNIDAZOLE  (FLAGYL ) 500 MG tablet Take 1 tablet (500 mg total) by mouth 2 (two) times daily. 01/20/20   Drusilla Sabas RAMAN, MD   DG Foot Complete Left Result Date: 05/03/2024 CLINICAL DATA:  Left foot pain and swelling with redness 1 week. Evaluate for osteomyelitis. EXAM: LEFT FOOT - COMPLETE 3+ VIEW COMPARISON:  11/04/2016 FINDINGS: No evidence of acute fracture or dislocation. Air within the soft tissues over the lateral aspect of the foot at the level of the base of the fifth metatarsal as well as mottled air over the lateral soft tissues adjacent the distal aspect of the fifth metatarsal. The soft tissue changes likely represent soft tissue infection/cellulitis. There is subtle lucency and ill definition of the base of the fifth metatarsal suggesting osteomyelitis. Small vessel atherosclerotic disease is present. Remainder of the exam is unremarkable. IMPRESSION: Findings suggesting soft tissue infection/cellulitis over the lateral aspect of the foot with subtle lucency and ill definition of the base of the fifth metatarsal suggesting osteomyelitis. Electronically Signed   By: Toribio Agreste M.D.   On: 05/03/2024 17:02   - pertinent xrays, CT, MRI  studies were reviewed and independently interpreted  Positive ROS: All other systems have been reviewed and were otherwise negative with the exception of those mentioned in the HPI and as above.  Physical Exam: General: Alert, no acute distress Psychiatric: Patient is competent for consent with normal mood and affect Lymphatic: No axillary or cervical lymphadenopathy Cardiovascular: No pedal edema Respiratory: No cyanosis, no use of accessory musculature GI: No organomegaly, abdomen is soft and  non-tender    Images:  @ENCIMAGES @  Labs:  Lab Results  Component Value Date   HGBA1C 11.6 (H) 05/04/2024   HGBA1C 12.0 (H) 01/16/2020   HGBA1C 11.4 (H) 11/04/2016   ESRSEDRATE 10 11/04/2016   CRP <0.8 11/04/2016   REPTSTATUS PENDING 05/03/2024   CULT  05/03/2024    NO GROWTH < 12 HOURS Performed at [facility], [address] [place], [place] [address]     Lab Results  Component Value Date   ALBUMIN 3.5 05/03/2024   ALBUMIN 3.3 (L) 07/04/2017   ALBUMIN 3.7 02/24/2017        Latest Ref Rng & Units 05/04/2024    3:21 AM 05/03/2024    2:15 PM 01/20/2020    5:07 AM  CBC EXTENDED  WBC 4.0 - 10.5 K/uL 16.6  13.3  10.8   RBC 3.87 - 5.11 MIL/uL 4.45  4.94  4.87   Hemoglobin 12.0 - 15.0 g/dL 87.7  86.9  87.0   HCT 36.0 - 46.0 % 39.6  44.3  43.2   Platelets 150 - 400 K/uL 271  255  286   NEUT# 1.7 - 7.7 K/uL  10.4    Lymph# 0.7 - 4.0 K/uL  1.5      Neurologic: Patient does not have protective sensation bilateral lower extremities.   MUSCULOSKELETAL:   Skin: Normal for examination patient has cellulitis involving three quarters of the left foot.  There is a necrotic ulcer at the base of the fifth metatarsal.  Review of the radiographs shows destructive bony changes at the base of the fifth metatarsal consistent with chronic osteomyelitis and there is air and soft tissue along the lateral aspect of her foot.  Patient has a palpable dorsalis pedis pulse.  We will need to get an ABI to obtain a baseline with her uncontrolled diabetes.  White cell count 16.6 with a hemoglobin of 12.2.  Hemoglobin A1c 11.6 consistently greater than 11 sleep for the past 7 years.  Patient states that she has not been taking her insulin .  Assessment: Assessment: Abscess and osteomyelitis lateral aspect of the left foot.  Plan: Plan: Will plan for 1/5 ray amputation tomorrow.  Discussed with the patient there may be extensive soft tissue infection  and may require additional  surgical debridement.  Will need to get an ankle-brachial indices for baseline secondary to her prolonged Uncontrolled type 2 diabetes.  Thank you for the consult and the opportunity to see [name], MD [facility] [phone] 8:21 AM

## 2024-05-04 NOTE — Progress Notes (Signed)
 Pharmacy Antibiotic Note  Bianca Yang is a 48 y.o. female admitted on 05/03/2024 with right DFI/cellulitis.  Pharmacy has been consulted for vancomycin  dosing; also on Unasyn . She received vancomycin  1000 mg IV 11/5 17:00 and Unasyn  3 g IV 11/5 16:30.  Renal function normal, febrile, and WBC increased to 16.6.   Plan: Vancomycin  750 mg IV q12h Goal AUC 400-550, eAUC 425, SCr used 0.8, Vd 0.72  Change Unasyn  to 3 g IV q6h Monitor renal function, clinical progress, cultures/sensitivities F/U LOT and de-escalate as able Vancomycin  levels as clinically indicated   Height: 5' 7.5 (171.5 cm) (pt report recent UC visit) Weight: 67.1 kg (148 lb) (per pt report from recent UC visit) IBW/kg (Calculated) : 62.75  Temp (24hrs), Avg:99 F (37.2 C), Min:98 F (36.7 C), Max:102.8 F (39.3 C)  Recent Labs  Lab 05/03/24 1415 05/03/24 1424 05/03/24 1612 05/04/24 0321  WBC 13.3*  --   --  16.6*  CREATININE 0.71  --   --  0.58  LATICACIDVEN  --  2.5* 2.2*  --     Estimated Creatinine Clearance: 84.3 mL/min (by C-G formula based on SCr of 0.58 mg/dL).    Allergies  Allergen Reactions   Sulfa Antibiotics Other (See Comments)    Unknown/ childhood allergy.     Antimicrobials this admission: Vanc 11/5 >> Unasyn  11/5 >>  Dose adjustments this admission: N/a  Microbiology results: 11/6 Staph aureus PCR + 11/5 BCx: ngtd  Thank you for involving pharmacy in this patient's care.  Delon Sax, PharmD, BCPS Clinical Pharmacist Clinical phone for 05/04/2024 is [phone] 05/04/2024 8:03 AM

## 2024-05-05 ENCOUNTER — Encounter (HOSPITAL_COMMUNITY): Payer: Self-pay | Admitting: Internal Medicine

## 2024-05-05 ENCOUNTER — Inpatient Hospital Stay (HOSPITAL_COMMUNITY): Payer: Self-pay | Admitting: Anesthesiology

## 2024-05-05 ENCOUNTER — Encounter (HOSPITAL_COMMUNITY)
Admission: EM | Disposition: A | Discharge status: Home / Self Care | Payer: Self-pay | Source: Home / Self Care | Attending: Family Medicine

## 2024-05-05 DIAGNOSIS — M86272 Subacute osteomyelitis, left ankle and foot: Secondary | ICD-10-CM | POA: Diagnosis not present

## 2024-05-05 DIAGNOSIS — E1169 Type 2 diabetes mellitus with other specified complication: Secondary | ICD-10-CM

## 2024-05-05 DIAGNOSIS — Z7984 Long term (current) use of oral hypoglycemic drugs: Secondary | ICD-10-CM

## 2024-05-05 DIAGNOSIS — L089 Local infection of the skin and subcutaneous tissue, unspecified: Secondary | ICD-10-CM | POA: Diagnosis not present

## 2024-05-05 DIAGNOSIS — M869 Osteomyelitis, unspecified: Secondary | ICD-10-CM

## 2024-05-05 DIAGNOSIS — Z794 Long term (current) use of insulin: Secondary | ICD-10-CM | POA: Diagnosis not present

## 2024-05-05 DIAGNOSIS — E11628 Type 2 diabetes mellitus with other skin complications: Secondary | ICD-10-CM | POA: Diagnosis not present

## 2024-05-05 DIAGNOSIS — L02612 Cutaneous abscess of left foot: Secondary | ICD-10-CM | POA: Diagnosis not present

## 2024-05-05 HISTORY — PX: APPLICATION OF WOUND VAC: SHX5189

## 2024-05-05 HISTORY — PX: AMPUTATION: SHX166

## 2024-05-05 LAB — GLUCOSE, CAPILLARY
Glucose-Capillary: 254 mg/dL — ABNORMAL HIGH (ref 70–99)
Glucose-Capillary: 80 mg/dL (ref 70–99)
Glucose-Capillary: 91 mg/dL (ref 70–99)
Glucose-Capillary: 96 mg/dL (ref 70–99)
Glucose-Capillary: 110 mg/dL — ABNORMAL HIGH (ref 70–99)

## 2024-05-05 SURGERY — AMPUTATION, FOOT, RAY
Anesthesia: General | Laterality: Left

## 2024-05-05 MED ORDER — PROPOFOL 10 MG/ML IV BOLUS
INTRAVENOUS | Status: AC
  Filled 2024-05-05: qty 20

## 2024-05-05 MED ORDER — FENTANYL CITRATE (PF) 100 MCG/2ML IJ SOLN
INTRAMUSCULAR | Status: AC
  Filled 2024-05-05: qty 2

## 2024-05-05 MED ORDER — HYDROMORPHONE HCL 1 MG/ML IJ SOLN
INTRAMUSCULAR | Status: AC
  Filled 2024-05-05: qty 0.5

## 2024-05-05 MED ORDER — NALOXONE HCL 0.4 MG/ML IJ SOLN
INTRAMUSCULAR | Status: AC
  Filled 2024-05-05: qty 1

## 2024-05-05 MED ORDER — VASHE WOUND IRRIGATION OPTIME
TOPICAL | Status: DC | PRN
  Administered 2024-05-05: 34 [oz_av]

## 2024-05-05 MED ORDER — TOBRAMYCIN SULFATE 1.2 G IJ SOLR
INTRAMUSCULAR | Status: AC
  Filled 2024-05-05: qty 1.2

## 2024-05-05 MED ORDER — EPHEDRINE SULFATE-NACL 50-0.9 MG/10ML-% IV SOSY
PREFILLED_SYRINGE | INTRAVENOUS | Status: DC | PRN
  Administered 2024-05-05: 10 mg via INTRAVENOUS

## 2024-05-05 MED ORDER — ONDANSETRON HCL 4 MG/2ML IJ SOLN
INTRAMUSCULAR | Status: AC
Start: 2024-05-05 — End: 2024-05-05
  Filled 2024-05-05: qty 2

## 2024-05-05 MED ORDER — MIDAZOLAM HCL 5 MG/5ML IJ SOLN
INTRAMUSCULAR | Status: DC | PRN
  Administered 2024-05-05: 2 mg via INTRAVENOUS

## 2024-05-05 MED ORDER — CHLORHEXIDINE GLUCONATE 0.12 % MT SOLN
15.0000 mL | Freq: Once | OROMUCOSAL | Status: AC
  Administered 2024-05-05: 15 mL via OROMUCOSAL
  Filled 2024-05-05: qty 15

## 2024-05-05 MED ORDER — AMISULPRIDE (ANTIEMETIC) 5 MG/2ML IV SOLN: 10.0000 mg | Freq: Once | INTRAVENOUS | Status: DC | PRN

## 2024-05-05 MED ORDER — VANCOMYCIN HCL 1000 MG IV SOLR
INTRAVENOUS | Status: DC | PRN
Start: 2024-05-05 — End: 2024-05-05
  Administered 2024-05-05: 1000 mg

## 2024-05-05 MED ORDER — FENTANYL CITRATE (PF) 250 MCG/5ML IJ SOLN
INTRAMUSCULAR | Status: DC | PRN
  Administered 2024-05-05: 100 ug via INTRAVENOUS
  Administered 2024-05-05: 25 ug via INTRAVENOUS
  Administered 2024-05-05: 50 ug via INTRAVENOUS
  Administered 2024-05-05: 25 ug via INTRAVENOUS

## 2024-05-05 MED ORDER — MIDAZOLAM HCL 2 MG/2ML IJ SOLN
INTRAMUSCULAR | Status: AC
Start: 2024-05-05 — End: 2024-05-05
  Filled 2024-05-05: qty 2

## 2024-05-05 MED ORDER — ONDANSETRON HCL 4 MG/2ML IJ SOLN: 4.0000 mg | Freq: Once | INTRAMUSCULAR | Status: DC | PRN

## 2024-05-05 MED ORDER — LIDOCAINE 2% (20 MG/ML) 5 ML SYRINGE
INTRAMUSCULAR | Status: AC
  Filled 2024-05-05: qty 5

## 2024-05-05 MED ORDER — DEXMEDETOMIDINE HCL IN NACL 80 MCG/20ML IV SOLN
INTRAVENOUS | Status: DC | PRN
  Administered 2024-05-05: 16 ug via INTRAVENOUS

## 2024-05-05 MED ORDER — LACTATED RINGERS IV SOLN: INTRAVENOUS | Status: DC

## 2024-05-05 MED ORDER — HYDROMORPHONE HCL 1 MG/ML IJ SOLN
INTRAMUSCULAR | Status: DC | PRN
  Administered 2024-05-05: .5 mg via INTRAVENOUS

## 2024-05-05 MED ORDER — INSULIN ASPART 100 UNIT/ML IJ SOLN
0.0000 [IU] | INTRAMUSCULAR | Status: DC | PRN
  Filled 2024-05-05: qty 0.14

## 2024-05-05 MED ORDER — PHENYLEPHRINE 80 MCG/ML (10ML) SYRINGE FOR IV PUSH (FOR BLOOD PRESSURE SUPPORT)
PREFILLED_SYRINGE | INTRAVENOUS | Status: DC | PRN
  Administered 2024-05-05: 120 ug via INTRAVENOUS
  Administered 2024-05-05: 160 ug via INTRAVENOUS

## 2024-05-05 MED ORDER — FENTANYL CITRATE (PF) 100 MCG/2ML IJ SOLN: 25.0000 ug | INTRAMUSCULAR | Status: DC | PRN

## 2024-05-05 MED ORDER — LIDOCAINE 2% (20 MG/ML) 5 ML SYRINGE
INTRAMUSCULAR | Status: DC | PRN
  Administered 2024-05-05: 80 mg via INTRAVENOUS

## 2024-05-05 MED ORDER — TOBRAMYCIN SULFATE 1.2 G IJ SOLR
INTRAMUSCULAR | Status: DC | PRN
  Administered 2024-05-05: 1.2 g

## 2024-05-05 MED ORDER — PROPOFOL 10 MG/ML IV BOLUS
INTRAVENOUS | Status: DC | PRN
  Administered 2024-05-05: 130 mg via INTRAVENOUS

## 2024-05-05 MED ORDER — VANCOMYCIN HCL 1000 MG IV SOLR
INTRAVENOUS | Status: AC
  Filled 2024-05-05: qty 20

## 2024-05-05 MED ORDER — ORAL CARE MOUTH RINSE: 15.0000 mL | Freq: Once | OROMUCOSAL | Status: AC

## 2024-05-05 SURGICAL SUPPLY — 27 items
BAG COUNTER SPONGE SURGICOUNT (BAG) ×2 IMPLANT
BLADE SAW SGTL MED 73X18.5 STR (BLADE) IMPLANT
BLADE SURG 21 STRL SS (BLADE) ×2 IMPLANT
BNDG COHESIVE 4X5 TAN STRL LF (GAUZE/BANDAGES/DRESSINGS) ×2 IMPLANT
BNDG GAUZE DERMACEA FLUFF 4 (GAUZE/BANDAGES/DRESSINGS) ×2 IMPLANT
COVER SURGICAL LIGHT HANDLE (MISCELLANEOUS) ×4 IMPLANT
DRAPE U-SHAPE 47X51 STRL (DRAPES) ×4 IMPLANT
DRSG ADAPTIC 3X8 NADH LF (GAUZE/BANDAGES/DRESSINGS) ×2 IMPLANT
DRSG VAC PEEL AND PLACE LRG (GAUZE/BANDAGES/DRESSINGS) IMPLANT
DURAPREP 26ML APPLICATOR (WOUND CARE) ×2 IMPLANT
ELECTRODE REM PT RTRN 9FT ADLT (ELECTROSURGICAL) ×2 IMPLANT
GAUZE PAD ABD 8X10 STRL (GAUZE/BANDAGES/DRESSINGS) ×4 IMPLANT
GAUZE SPONGE 4X4 12PLY STRL (GAUZE/BANDAGES/DRESSINGS) ×2 IMPLANT
GLOVE BIOGEL PI IND STRL 9 (GLOVE) ×2 IMPLANT
GLOVE SURG ORTHO 9.0 STRL STRW (GLOVE) ×2 IMPLANT
GOWN STRL REUS W/ TWL XL LVL3 (GOWN DISPOSABLE) ×4 IMPLANT
GRAFT SKIN WND MICRO 38 (Tissue) IMPLANT
KIT BASIN OR (CUSTOM PROCEDURE TRAY) ×2 IMPLANT
KIT TURNOVER KIT B (KITS) ×2 IMPLANT
PACK ORTHO EXTREMITY (CUSTOM PROCEDURE TRAY) ×2 IMPLANT
PAD ARMBOARD POSITIONER FOAM (MISCELLANEOUS) ×4 IMPLANT
SOLN 0.9% NACL POUR BTL 1000ML (IV SOLUTION) ×2 IMPLANT
STOCKINETTE IMPERVIOUS LG (DRAPES) IMPLANT
SUT ETHILON 2 0 PSLX (SUTURE) ×2 IMPLANT
TOWEL GREEN STERILE (TOWEL DISPOSABLE) ×2 IMPLANT
TUBE CONNECTING 12X1/4 (SUCTIONS) ×2 IMPLANT
YANKAUER SUCT BULB TIP NO VENT (SUCTIONS) ×2 IMPLANT

## 2024-05-05 NOTE — Inpatient Diabetes Management (Signed)
 Inpatient Diabetes Program Recommendations  AACE/ADA: New Consensus Statement on Inpatient Glycemic Control (2015)  Target Ranges:  Prepandial:   less than 140 mg/dL      Peak postprandial:   less than 180 mg/dL (1-2 hours)      Critically ill patients:  140 - 180 mg/dL   Lab Results  Component Value Date   GLUCAP 91 05/05/2024   HGBA1C 11.6 (H) 05/04/2024    Review of Glycemic Control  Latest Reference Range & Units 05/05/24 06:10 05/05/24 11:06 05/05/24 11:49  Glucose-Capillary 70 - 99 mg/dL 745 (H) 80 91  (H): Data is abnormally high Diabetes history: DM2 Outpatient Diabetes medications: Levemir  50 units at bedtime, Metfomrin XR 1000 mg QAM Current orders for Inpatient glycemic control: Novolog  0-15 units Q4H, Novolog  0-14 units Q2H   Inpatient Diabetes Program Recommendations:    Discharge Recommendations: Long acting recommendations: Insulin  Glargine (LANTUS) Solostar Pen Dose TBD at time of Discharge  Supply/Referral recommendations: Glucometer Test strips Lancet device Lancets Pen needles - standard Rx to restart Metformin  as well   Use Adult Diabetes Insulin  Treatment Post Discharge order set.   Attempted to see patient in person x 2, however, patient in peri-op. Will plan to have DM team follow up when appropriate.   Thanks, Tinnie Minus, MSN, RNC-OB Diabetes Coordinator [phone] (8a-5p)

## 2024-05-05 NOTE — Op Note (Signed)
 05/05/2024  3:04 PM  PATIENT:  Bianca Yang    PRE-OPERATIVE DIAGNOSIS:  Osteomyelitis Left 5th Metatarsal  POST-OPERATIVE DIAGNOSIS:  Same  PROCEDURE:  AMPUTATION, FOOT, RAY,  APPLICATION WOUND VAC peel in place. Local tissue transfer for wound closure 3 x 10 cm. Application of Kerecis micro graft with 1 g vancomycin  powder.  SURGEON:  Jerona LULLA Sage, MD  PHYSICIAN ASSISTANT:None ANESTHESIA:   General  PREOPERATIVE INDICATIONS:  Bianca Yang is a  48 y.o. female with a diagnosis of Osteomyelitis Left 5th Metatarsal who failed conservative measures and elected for surgical management.    The risks benefits and alternatives were discussed with the patient preoperatively including but not limited to the risks of infection, bleeding, nerve injury, cardiopulmonary complications, the need for revision surgery, among others, and the patient was willing to proceed.  OPERATIVE IMPLANTS:   Implant Name Type Inv. Item Serial No. Manufacturer Lot No. LRB No. Used Action  GRAFT SKIN WND MICRO 38 - [id number] Tissue GRAFT SKIN WND MICRO 38  [employer] [id number] Left 1 Implanted    @ENCIMAGES @  OPERATIVE FINDINGS: Tissue margins were grossly clear abscess tissue was sent for cultures.  Patient will need an anticipated 2 to 3 weeks of oral antibiotics.  OPERATIVE PROCEDURE: Patient was brought to the operating room and underwent a general anesthetic.  After adequate levels anesthesia were obtained patient's left lower extremity was prepped using DuraPrep draped into a sterile field a timeout was called.  An elliptical incision was made around the ulcerative tissue and the fifth ray.  The fifth ray and the necrotic abscess tissue was resected in 1 block of tissue.  Abscess tissue was sent for cultures.  Electrocautery was used for hemostasis.  Tissue margins were undermined to allow for local tissue transfer for wound that is 3 x 10 cm.  The wound bed was filled with Kerecis micro graft  38 cm and 1 g vancomycin  powder.  Local tissue transfer was used to close the wound 10 x 3 cm after undermining the edges to allow for advancement.  This was secured with a peel in place Prevena wound VAC.  This had a good suction fit patient was extubated taken the PACU in stable condition.   DISCHARGE PLANNING:  Antibiotic duration: Continue antibiotics and adjust according to tissue cultures  Weightbearing: Nonweightbearing on the left  Pain medication: Opioid pathway  Dressing care/ Wound VAC: Continue wound VAC for 1 week  Ambulatory devices: Walker or crutches  Discharge to: Discharge planning based on therapy recommendations.  Follow-up: In the office 1 week post operative.

## 2024-05-05 NOTE — Interval H&P Note (Signed)
 History and Physical Interval Note:  05/05/2024 6:47 AM  Bari JONELLE Bars  has presented today for surgery, with the diagnosis of Osteomyelitis Left 5th Metatarsal.  The various methods of treatment have been discussed with the patient and family. After consideration of risks, benefits and other options for treatment, the patient has consented to  Procedure(s) with comments: AMPUTATION, FOOT, RAY (Left) - LEFT FOOT 5TH RAY AMPUTATION as a surgical intervention.  The patient's history has been reviewed, patient examined, no change in status, stable for surgery.  I have reviewed the patient's chart and labs.  Questions were answered to the patient's satisfaction.     Brynda Heick V Lorana Maffeo

## 2024-05-05 NOTE — Progress Notes (Signed)
 [facility]   ROUNDING   NOTE TIERNEY BEHL FMW:[id number]  DOB: 02-24-1976  DOA: 05/03/2024  PCP: Patient, [name] Per  05/05/2024,4:06 PM  LOS: 2 days    Code Status:  Full code     From:  home    48 year old white female DM TY 2, previous PID, STD-cocaine abuse Previous admission 01/15/2020 through 01/20/2020 osteomyelitis fifth proximal digit managed conservatively with ID and orthopedic input Return to hospital 05/03/2024 left foot pain erythema tracking proximally intact pulses large ulcer along base of fifth metatarsal  Chronology  11/5 presented to [facility] with above complaints transferred to [facility] for Dr. Harden to see-in mild DKA insulin  drip started and rapidly discontinued-noncompliant on meds apparently    Pertinent imaging/studies till date  Glucose 534 gap 33 bicarb 10 WBC 13 vitals relatively stable other than heart rate in the 100s Left foot x-ray subtle lucency ill-definition of base of fifth metatarsal suggesting osteomyelitis 11/7 Dr. Harden  PROCEDURE:  AMPUTATION, FOOT, RAY,APPLICATION WOUND VAC peel I  in place.Local tissue transfer for wound closure 3 x 10 cm. Application of Kerecis micro graft with 1 g vancomycin  powder.   Assessment  & Plan :    Cellulitis lower extremity status post surgery 11/7 Saline lock blood culture X2 negative, deep surgical wound cultures 11/7 pending Continue this time ampicillin  Q6 vancomycin  until cultures from deep wound result Postop care as per Dr. Sherwood control Tylenol  1000 every 6 Naprosyn  500 twice daily and oxycodone  5 every 4 as needed severe pain-try to limit IV pain meds In the next 24 hours will need to have a discussion with her about expectations for pain control as she was opioid positive on admission DKA on admission now resolved Uncontrolled diabetes mellitus A1c 11.6 secondary to noncompliance-apparently supposed be on metformin  as well as Levemir  50 CBGs range 91-2 50 currently on metformin  500 twice daily in addition  to moderate sliding scale Would not use Levemir  just yet-watch trends of CBGs Mild metabolic alkalosis dating back to 2021 Etiology unclear  Would give acetazolamide if goes above 38-this has been chronic since 2021 PID previously with other STDs H/o Previous cocaine abuse Substance abuse disorder Positive for cocaine in addition to opiates No narcotics on discharge but we will control the pain as best as possible here  Data Reviewed today:  Sodium 136 potassium 4.6 chloride 94 bicarb is 34 BUN/creatinine 13/0.5 WBC 16.6 hemoglobin 12.2   DVT prophylaxis: SCD  Status is: Inpatient Inpatient need recovery    Dispo/Global plan: Unclear at this time   Time 20   Subjective:    Appears sleepy back from surgery no distress currently seems comfortable  Objective + exam Vitals:   05/05/24 1400 05/05/24 1415 05/05/24 1430 05/05/24 1529  BP: 135/62 121/69 130/62 111/74  Pulse: 82 79 80 80  Resp: 14 12 12 18   Temp:   98.5 F (36.9 C) 98.2 F (36.8 C)  TempSrc:    Oral  SpO2: 100% 100% 94% 96%  Weight:      Height:       Filed Weights   05/04/24 0700  Weight: 67.1 kg     Examination: EOMI NCAT no focal deficit S1-S2 no murmur Foot now has a wound VAC in place  Scheduled Meds:  acetaminophen   1,000 mg Oral Q6H   Chlorhexidine Gluconate Cloth  6 each Topical Daily   insulin  aspart  0-15 Units Subcutaneous TID WC   metFORMIN   500 mg Oral BID WC  mupirocin ointment  1 Application Nasal BID   naproxen   500 mg Oral BID WC   Continuous Infusions:  ampicillin -sulbactam (UNASYN ) IV Stopped (05/05/24 0858)   vancomycin  750 mg (05/05/24 0936)   ondansetron  **OR** ondansetron  (ZOFRAN ) IV, oxyCODONE , polyethylene glycol  Jai-Gurmukh Lejend Dalby, MD  [facility]

## 2024-05-05 NOTE — Anesthesia Procedure Notes (Signed)
 Procedure Name: LMA Insertion Date/Time: 05/05/2024 12:57 PM  Performed by: Delores Duwaine SAUNDERS, CRNAPre-anesthesia Checklist: Patient identified, Emergency Drugs available, Suction available and Patient being monitored Patient Re-evaluated:Patient Re-evaluated prior to induction Oxygen Delivery Method: Circle System Utilized Preoxygenation: Pre-oxygenation with 100% oxygen Induction Type: IV induction Ventilation: Mask ventilation without difficulty LMA: LMA inserted LMA Size: 4.0 Number of attempts: 1 Airway Equipment and Method: Bite block Placement Confirmation: positive ETCO2 Tube secured with: Tape Dental Injury: Teeth and Oropharynx as per pre-operative assessment

## 2024-05-05 NOTE — Transfer of Care (Signed)
 Immediate Anesthesia Transfer of Care Note  Patient: Bianca Yang  Procedure(s) Performed: AMPUTATION, FOOT, RAY (Left) APPLICATION, WOUND VAC  Patient Location: PACU  Anesthesia Type:General  Level of Consciousness: drowsy  Airway & Oxygen Therapy: Patient Spontanous Breathing  Post-op Assessment: Report given to RN  Post vital signs: Reviewed and stable  Last Vitals:  Vitals Value Taken Time  BP 145/66 05/05/24 13:51  Temp 36.8 C 05/05/24 13:51  Pulse 82 05/05/24 14:00  Resp 14 05/05/24 14:00  SpO2 100 % 05/05/24 14:00  Vitals shown include unfiled device data.  Last Pain:  Vitals:   05/05/24 1351  TempSrc:   PainSc: 0-No pain         Complications: No notable events documented.

## 2024-05-05 NOTE — Inpatient Diabetes Management (Signed)
 Inpatient Diabetes Program Recommendations  AACE/ADA: New Consensus Statement on Inpatient Glycemic Control (2015)  Target Ranges:  Prepandial:   less than 140 mg/dL      Peak postprandial:   less than 180 mg/dL (1-2 hours)      Critically ill patients:  140 - 180 mg/dL    Latest Reference Range & Units 05/04/24 03:21  Hemoglobin A1C 4.8 - 5.6 % 11.6 (H)  286 mg/dl  (H): Data is abnormally high  Latest Reference Range & Units 05/04/24 07:59 05/04/24 13:02 05/04/24 16:44 05/04/24 21:25  Glucose-Capillary 70 - 99 mg/dL 825 (H) 659 (H)  11 units Novolog   318 (H)  11 units Novolog   174 (H)  (H): Data is abnormally high  Latest Reference Range & Units 05/05/24 06:10  Glucose-Capillary 70 - 99 mg/dL 745 (H)  8 units Novolog    (H): Data is abnormally high    Admit with: Ulceration left foot with cellulitis   History: DM  Home DM Meds:  Levemir  50 units at bedtime (NOT taking)      Metfomrin XR 1000 mg QAM (NOT taking)  Current Orders: Novolog  Moderate Correction Scale/ SSI (0-15 units) TID AC      Metformin  500 mg BID    NPO for Amputation + I&D today  MD- Note CBG 254 this AM.  Afternoon CBGs elevated yest as well.  Please consider:  1. Start Semglee 7 units daily (0.1 units/kg)  2. When pt resumes PO diet, may also consider starting Novolog  Meal Coverage: Novolog  4 units TID with meals HOLD if pt NPO HOLD if pt eats <50% meals  Of note, pt has not been taking her Metformin  nor Insulin  at home Manufacturer no longer making Levemir  If you plan to d/c pt on Basal Insulin , will need to switch to Lantus (pt appears to have Medicaid and Lantus should be covered per the 2025 Medicaid formulary)   I attempted to call pt today on both the hospital phone and her cell phone this AM (9:45am) and pt did not answer either phone    --Will follow patient during hospitalization--  Adina Rudolpho Arrow RN, MSN, CDCES Diabetes Coordinator Inpatient Glycemic Control  Team Team Pager: [phone] (8a-5p)

## 2024-05-05 NOTE — Anesthesia Preprocedure Evaluation (Signed)
 Anesthesia Evaluation  Patient identified by MRN, date of birth, ID band Patient awake    Reviewed: Allergy & Precautions, NPO status , Patient's Chart, lab work & pertinent test results  Airway Mallampati: II  TM Distance: >3 FB Neck ROM: Full    Dental  (+) Dental Advisory Given, Poor Dentition   Pulmonary Current Smoker and Patient abstained from smoking.   Pulmonary exam normal breath sounds clear to auscultation       Cardiovascular negative cardio ROS Normal cardiovascular exam Rhythm:Regular Rate:Normal     Neuro/Psych negative neurological ROS     GI/Hepatic negative GI ROS, Neg liver ROS,,,  Endo/Other  diabetes, Type 2, Oral Hypoglycemic Agents, Insulin  Dependent    Renal/GU negative Renal ROS     Musculoskeletal Osteomyelitis Left 5th Metatarsal   Abdominal   Peds  Hematology negative hematology ROS (+)   Anesthesia Other Findings Day of surgery medications reviewed with the patient.  Reproductive/Obstetrics                              Anesthesia Physical Anesthesia Plan  ASA: 3  Anesthesia Plan: General   Post-op Pain Management: Tylenol  PO (pre-op)*   Induction: Intravenous  PONV Risk Score and Plan: 2 and Dexamethasone and Ondansetron   Airway Management Planned: LMA  Additional Equipment:   Intra-op Plan:   Post-operative Plan: Extubation in OR  Informed Consent: I have reviewed the patients History and Physical, chart, labs and discussed the procedure including the risks, benefits and alternatives for the proposed anesthesia with the patient or authorized representative who has indicated his/her understanding and acceptance.     Dental advisory given  Plan Discussed with: CRNA  Anesthesia Plan Comments:         Anesthesia Quick Evaluation

## 2024-05-06 DIAGNOSIS — L089 Local infection of the skin and subcutaneous tissue, unspecified: Secondary | ICD-10-CM | POA: Diagnosis not present

## 2024-05-06 DIAGNOSIS — E11628 Type 2 diabetes mellitus with other skin complications: Secondary | ICD-10-CM | POA: Diagnosis not present

## 2024-05-06 LAB — COMPREHENSIVE METABOLIC PANEL WITH GFR
ALT: 11 U/L (ref 0–44)
AST: 12 U/L — ABNORMAL LOW (ref 15–41)
Albumin: 2.2 g/dL — ABNORMAL LOW (ref 3.5–5.0)
Alkaline Phosphatase: 105 U/L (ref 38–126)
Anion gap: 11 (ref 5–15)
BUN: 14 mg/dL (ref 6–20)
CO2: 29 mmol/L (ref 22–32)
Calcium: 8.5 mg/dL — ABNORMAL LOW (ref 8.9–10.3)
Chloride: 94 mmol/L — ABNORMAL LOW (ref 98–111)
Creatinine, Ser: 0.64 mg/dL (ref 0.44–1.00)
GFR, Estimated: >60 mL/min (ref >60)
Glucose, Bld: 484 mg/dL — ABNORMAL HIGH (ref 70–99)
Potassium: 4.7 mmol/L (ref 3.5–5.1)
Sodium: 134 mmol/L — ABNORMAL LOW (ref 135–145)
Total Bilirubin: 0.2 mg/dL (ref 0.0–1.2)
Total Protein: 6.4 g/dL — ABNORMAL LOW (ref 6.5–8.1)

## 2024-05-06 LAB — CBC WITH DIFFERENTIAL/PLATELET
Abs Immature Granulocytes: 0.06 K/uL (ref 0.00–0.07)
Basophils Absolute: 0.1 K/uL (ref 0.0–0.1)
Basophils Relative: 0 %
Eosinophils Absolute: 0.5 K/uL (ref 0.0–0.5)
Eosinophils Relative: 3 %
HCT: 34 % — ABNORMAL LOW (ref 36.0–46.0)
Hemoglobin: 10.5 g/dL — ABNORMAL LOW (ref 12.0–15.0)
Immature Granulocytes: 0 %
Lymphocytes Relative: 10 %
Lymphs Abs: 1.4 K/uL (ref 0.7–4.0)
MCH: 27.3 pg (ref 26.0–34.0)
MCHC: 30.9 g/dL (ref 30.0–36.0)
MCV: 88.3 fL (ref 80.0–100.0)
Monocytes Absolute: 0.9 K/uL (ref 0.1–1.0)
Monocytes Relative: 7 %
Neutro Abs: 11.1 K/uL — ABNORMAL HIGH (ref 1.7–7.7)
Neutrophils Relative %: 80 %
Platelets: 239 K/uL (ref 150–400)
RBC: 3.85 MIL/uL — ABNORMAL LOW (ref 3.87–5.11)
RDW: 13.1 % (ref 11.5–15.5)
WBC: 14 K/uL — ABNORMAL HIGH (ref 4.0–10.5)
nRBC: 0 % (ref 0.0–0.2)

## 2024-05-06 LAB — GLUCOSE, CAPILLARY
Glucose-Capillary: 485 mg/dL — ABNORMAL HIGH (ref 70–99)
Glucose-Capillary: 334 mg/dL — ABNORMAL HIGH (ref 70–99)
Glucose-Capillary: 74 mg/dL (ref 70–99)

## 2024-05-06 MED ORDER — MORPHINE SULFATE (PF) 2 MG/ML IV SOLN: 1.0000 mg | INTRAVENOUS | Status: DC | PRN

## 2024-05-06 MED ORDER — HYDROMORPHONE HCL 1 MG/ML IJ SOLN
1.0000 mg | INTRAMUSCULAR | Status: DC | PRN
  Administered 2024-05-06 – 2024-05-07 (×6): 1 mg via INTRAVENOUS
  Filled 2024-05-06 (×7): qty 1

## 2024-05-06 MED ORDER — METFORMIN HCL 500 MG PO TABS
1000.0000 mg | ORAL_TABLET | Freq: Two times a day (BID) | ORAL | Status: DC
  Administered 2024-05-06 – 2024-05-09 (×6): 1000 mg via ORAL
  Filled 2024-05-06 (×6): qty 2

## 2024-05-06 MED ORDER — OXYCODONE HCL 5 MG PO TABS
5.0000 mg | ORAL_TABLET | ORAL | Status: DC | PRN
  Administered 2024-05-07 – 2024-05-09 (×10): 10 mg via ORAL
  Filled 2024-05-06 (×10): qty 2

## 2024-05-06 NOTE — Plan of Care (Signed)

## 2024-05-06 NOTE — Progress Notes (Addendum)
 Patient ID: Bianca Yang, female   DOB: 05/14/1976, 48 y.o.   MRN: [id number] Patient is postop day 1 left foot fifth ray amputation.  There is a large abscess that extended dorsally over the foot.  Cultures are obtained.  Wound VAC has 50 cc with a good suction fit.  Anticipate patient will need 2 to 3 weeks of oral antibiotics based on culture sensitivities.  She may be up with therapy ideally nonweightbearing on the left however she may place some weight through the left heel if needed for stability.  Interoperative tissue cultures are showing gram-positive cocci and gram-negative rods.

## 2024-05-06 NOTE — Plan of Care (Signed)
  Problem: Fluid Volume: Goal: Ability to maintain a balanced intake and output will improve Outcome: Progressing   Problem: Nutritional: Goal: Progress toward achieving an optimal weight will improve Outcome: Progressing   Problem: Health Behavior/Discharge Planning: Goal: Ability to manage health-related needs will improve Outcome: Progressing   Problem: Activity: Goal: Risk for activity intolerance will decrease Outcome: Progressing

## 2024-05-06 NOTE — Progress Notes (Signed)
 This RN notified of hyperglycemia results of 485.  Confirmed with morning labs of 484.  Reviewed in room with patient and Hospitalist, Dr. Royal.  Orders received to give 15 units of Aspart insulin .  Patient verbalized understanding of POC.

## 2024-05-06 NOTE — Progress Notes (Signed)
 [facility]   ROUNDING   NOTE Bianca Yang FMW:[id number]  DOB: 12/08/1975  DOA: 05/03/2024  PCP: Patient, [name] Per  05/06/2024,10:35 AM  LOS: 3 days    Code Status:  Full code     From:  home    48 year old white female DM TY 2, previous PID, STD-cocaine abuse Previous admission 01/15/2020 through 01/20/2020 osteomyelitis fifth proximal digit managed conservatively with ID and orthopedic input Return to hospital 05/03/2024 left foot pain erythema tracking proximally intact pulses large ulcer along base of fifth metatarsal  Chronology  11/5 presented to [facility] with above complaints transferred to [facility] for Dr. Harden to see-in mild DKA insulin  drip started and rapidly discontinued-noncompliant on meds apparently    Pertinent imaging/studies till date  Glucose 534 gap 33 bicarb 10 WBC 13 vitals relatively stable other than heart rate in the 100s Left foot x-ray subtle lucency ill-definition of base of fifth metatarsal suggesting osteomyelitis 11/7 Dr. Harden  PROCEDURE:  AMPUTATION, FOOT, RAY,APPLICATION WOUND VAC peel I  in place.Local tissue transfer for wound closure 3 x 10 cm. Application of Kerecis micro graft with 1 g vancomycin  powder.   Assessment  & Plan :    Cellulitis lower extremity status post surgery 11/7 Deep wound cultures are pending--BC X2 from admission is pending continues on Unasyn  every 6 vancomycin  Q12 do not narrow until deep wound cultures are back Very clearly discussed with patient probably very limited amount of opiates on discharge-Tylenol  1000 every 6 first choice, Naprosyn  500 twice daily breakthrough, Oxy IR 5-10 every 4 as needed send for pain-for breakthrough pain can use Dilaudid  injection-I have made it very clear to her once again that with a positive UDS for opiates and cocaine we are limited by how aggressively this will be managed in the outpatient setting DKA on admission now resolved Uncontrolled diabetes mellitus A1c 11.6 secondary to  noncompliance-apparently supposed be on metformin  as well as Levemir  50 Brittle diabetes sugars 400s this morning Increase metformin  to thousand twice daily She does not take any meds at home-she has several cartons of milk and full sugar sodas at the bedside and I have asked her to cut back on the sugar in her diet Mild metabolic alkalosis dating back to 2021 Etiology unclear resolved no further workup PID previously with other STDs H/o Previous cocaine abuse Substance abuse disorder Positive for cocaine in addition to opiates See above discussion  Data Reviewed today:   Sodium 134 potassium 4.7 BUN/creatinine 14/0.6 AST/ALT 12/11 WBC 14 hemoglobin 10   DVT prophylaxis: SCD  Status is: Inpatient Inpatient need recovery    Dispo/Global plan: Unclear at this time   Time 30   Subjective:   In pain Wound VAC in place Nursing tells me that sugars are in the 400s Last night they were in the 100s She has had significant dietary indiscretions  Objective + exam Vitals:   05/05/24 1430 05/05/24 1529 05/05/24 1928 05/06/24 0709  BP: 130/62 111/74 108/62 137/65  Pulse: 80 80 77 84  Resp: 12 18 18 17   Temp: 98.5 F (36.9 C) 98.2 F (36.8 C) 98.2 F (36.8 C) 98.9 F (37.2 C)  TempSrc:  Oral Oral Oral  SpO2: 94% 96% 95% 93%  Weight:      Height:       Filed Weights   05/04/24 0700  Weight: 67.1 kg     Examination:  Coherent awake alert Chest clear Left foot and wound VAC with some serous drainage Abdomen  soft Edentulous Poor hygiene/affect  Scheduled Meds:  acetaminophen   1,000 mg Oral Q6H   Chlorhexidine Gluconate Cloth  6 each Topical Daily   insulin  aspart  0-15 Units Subcutaneous TID WC   metFORMIN   1,000 mg Oral BID WC   mupirocin ointment  1 Application Nasal BID   naproxen   500 mg Oral BID WC   Continuous Infusions:  ampicillin -sulbactam (UNASYN ) IV 3 g (05/06/24 0925)   vancomycin  750 mg (05/05/24 2105)   HYDROmorphone  (DILAUDID ) injection,  ondansetron  **OR** ondansetron  (ZOFRAN ) IV, oxyCODONE , polyethylene glycol  [name], MD  [facility]

## 2024-05-06 NOTE — Plan of Care (Signed)
 Problem: Education: Goal: Ability to describe self-care measures that may prevent or decrease complications (Diabetes Survival Skills Education) will improve 05/06/2024 2013 by German Adrien BRAVO, RN Outcome: Progressing 05/06/2024 1849 by German Adrien BRAVO, RN Outcome: Progressing Goal: Individualized Educational Video(s) 05/06/2024 2013 by German Adrien BRAVO, RN Outcome: Progressing 05/06/2024 1849 by German Adrien BRAVO, RN Outcome: Progressing   Problem: Coping: Goal: Ability to adjust to condition or change in health will improve 05/06/2024 2013 by German Adrien BRAVO, RN Outcome: Progressing 05/06/2024 1849 by German Adrien BRAVO, RN Outcome: Progressing   Problem: Fluid Volume: Goal: Ability to maintain a balanced intake and output will improve 05/06/2024 2013 by German Adrien BRAVO, RN Outcome: Progressing 05/06/2024 1849 by German Adrien BRAVO, RN Outcome: Progressing   Problem: Health Behavior/Discharge Planning: Goal: Ability to identify and utilize available resources and services will improve 05/06/2024 2013 by German Adrien BRAVO, RN Outcome: Progressing 05/06/2024 1849 by German Adrien BRAVO, RN Outcome: Progressing Goal: Ability to manage health-related needs will improve 05/06/2024 2013 by German Adrien BRAVO, RN Outcome: Progressing 05/06/2024 1849 by German Adrien BRAVO, RN Outcome: Progressing   Problem: Metabolic: Goal: Ability to maintain appropriate glucose levels will improve 05/06/2024 2013 by German Adrien BRAVO, RN Outcome: Progressing 05/06/2024 1849 by German Adrien BRAVO, RN Outcome: Progressing   Problem: Nutritional: Goal: Maintenance of adequate nutrition will improve 05/06/2024 2013 by German Adrien BRAVO, RN Outcome: Progressing 05/06/2024 1849 by German Adrien BRAVO, RN Outcome: Progressing Goal: Progress toward achieving an optimal weight will improve 05/06/2024 2013 by German Adrien BRAVO, RN Outcome: Progressing 05/06/2024 1849 by German Adrien BRAVO, RN Outcome: Progressing   Problem: Skin  Integrity: Goal: Risk for impaired skin integrity will decrease 05/06/2024 2013 by German Adrien BRAVO, RN Outcome: Progressing 05/06/2024 1849 by German Adrien BRAVO, RN Outcome: Progressing   Problem: Tissue Perfusion: Goal: Adequacy of tissue perfusion will improve 05/06/2024 2013 by German Adrien BRAVO, RN Outcome: Progressing 05/06/2024 1849 by German Adrien BRAVO, RN Outcome: Progressing   Problem: Education: Goal: Knowledge of General Education information will improve Description: Including pain rating scale, medication(s)/side effects and non-pharmacologic comfort measures 05/06/2024 2013 by German Adrien BRAVO, RN Outcome: Progressing 05/06/2024 1849 by German Adrien BRAVO, RN Outcome: Progressing   Problem: Health Behavior/Discharge Planning: Goal: Ability to manage health-related needs will improve 05/06/2024 2013 by German Adrien BRAVO, RN Outcome: Progressing 05/06/2024 1849 by German Adrien BRAVO, RN Outcome: Progressing   Problem: Clinical Measurements: Goal: Ability to maintain clinical measurements within normal limits will improve 05/06/2024 2013 by German Adrien BRAVO, RN Outcome: Progressing 05/06/2024 1849 by German Adrien BRAVO, RN Outcome: Progressing Goal: Will remain free from infection 05/06/2024 2013 by German Adrien BRAVO, RN Outcome: Progressing 05/06/2024 1849 by German Adrien BRAVO, RN Outcome: Progressing Goal: Diagnostic test results will improve 05/06/2024 2013 by German Adrien BRAVO, RN Outcome: Progressing 05/06/2024 1849 by German Adrien BRAVO, RN Outcome: Progressing Goal: Respiratory complications will improve 05/06/2024 2013 by German Adrien BRAVO, RN Outcome: Progressing 05/06/2024 1849 by German Adrien BRAVO, RN Outcome: Progressing Goal: Cardiovascular complication will be avoided 05/06/2024 2013 by German Adrien BRAVO, RN Outcome: Progressing 05/06/2024 1849 by German Adrien BRAVO, RN Outcome: Progressing   Problem: Activity: Goal: Risk for activity intolerance will decrease 05/06/2024 2013 by German Adrien BRAVO, RN Outcome: Progressing 05/06/2024 1849 by German Adrien BRAVO, RN Outcome: Progressing   Problem: Nutrition: Goal: Adequate nutrition will be maintained 05/06/2024 2013 by German Adrien BRAVO, RN Outcome: Progressing 05/06/2024 1849 by German Adrien BRAVO, RN Outcome: Progressing  Problem: Coping: Goal: Level of anxiety will decrease 05/06/2024 2013 by German Adrien BRAVO, RN Outcome: Progressing 05/06/2024 1849 by German Adrien BRAVO, RN Outcome: Progressing   Problem: Elimination: Goal: Will not experience complications related to bowel motility 05/06/2024 2013 by German Adrien BRAVO, RN Outcome: Progressing 05/06/2024 1849 by German Adrien BRAVO, RN Outcome: Progressing Goal: Will not experience complications related to urinary retention 05/06/2024 2013 by German Adrien BRAVO, RN Outcome: Progressing 05/06/2024 1849 by German Adrien BRAVO, RN Outcome: Progressing   Problem: Pain Managment: Goal: General experience of comfort will improve and/or be controlled 05/06/2024 2013 by German Adrien BRAVO, RN Outcome: Progressing 05/06/2024 1849 by German Adrien BRAVO, RN Outcome: Progressing   Problem: Safety: Goal: Ability to remain free from injury will improve 05/06/2024 2013 by German Adrien BRAVO, RN Outcome: Progressing 05/06/2024 1849 by German Adrien BRAVO, RN Outcome: Progressing   Problem: Skin Integrity: Goal: Risk for impaired skin integrity will decrease 05/06/2024 2013 by German Adrien BRAVO, RN Outcome: Progressing 05/06/2024 1849 by German Adrien BRAVO, RN Outcome: Progressing

## 2024-05-07 DIAGNOSIS — E11628 Type 2 diabetes mellitus with other skin complications: Secondary | ICD-10-CM | POA: Diagnosis not present

## 2024-05-07 DIAGNOSIS — L089 Local infection of the skin and subcutaneous tissue, unspecified: Secondary | ICD-10-CM | POA: Diagnosis not present

## 2024-05-07 LAB — BASIC METABOLIC PANEL WITH GFR
Anion gap: 12 (ref 5–15)
BUN: 12 mg/dL (ref 6–20)
CO2: 28 mmol/L (ref 22–32)
Calcium: 8.6 mg/dL — ABNORMAL LOW (ref 8.9–10.3)
Chloride: 97 mmol/L — ABNORMAL LOW (ref 98–111)
Creatinine, Ser: 0.44 mg/dL (ref 0.44–1.00)
GFR, Estimated: >60 mL/min (ref >60)
Glucose, Bld: 262 mg/dL — ABNORMAL HIGH (ref 70–99)
Potassium: 4.3 mmol/L (ref 3.5–5.1)
Sodium: 137 mmol/L (ref 135–145)

## 2024-05-07 LAB — CBC WITH DIFFERENTIAL/PLATELET
Abs Immature Granulocytes: 0.08 K/uL — ABNORMAL HIGH (ref 0.00–0.07)
Basophils Absolute: 0.1 K/uL (ref 0.0–0.1)
Basophils Relative: 1 %
Eosinophils Absolute: 0.5 K/uL (ref 0.0–0.5)
Eosinophils Relative: 4 %
HCT: 33.1 % — ABNORMAL LOW (ref 36.0–46.0)
Hemoglobin: 10.1 g/dL — ABNORMAL LOW (ref 12.0–15.0)
Immature Granulocytes: 1 %
Lymphocytes Relative: 12 %
Lymphs Abs: 1.6 K/uL (ref 0.7–4.0)
MCH: 27.2 pg (ref 26.0–34.0)
MCHC: 30.5 g/dL (ref 30.0–36.0)
MCV: 89.2 fL (ref 80.0–100.0)
Monocytes Absolute: 0.9 K/uL (ref 0.1–1.0)
Monocytes Relative: 7 %
Neutro Abs: 10 K/uL — ABNORMAL HIGH (ref 1.7–7.7)
Neutrophils Relative %: 75 %
Platelets: 237 K/uL (ref 150–400)
RBC: 3.71 MIL/uL — ABNORMAL LOW (ref 3.87–5.11)
RDW: 12.9 % (ref 11.5–15.5)
WBC: 13.1 K/uL — ABNORMAL HIGH (ref 4.0–10.5)
nRBC: 0 % (ref 0.0–0.2)

## 2024-05-07 LAB — GLUCOSE, CAPILLARY
Glucose-Capillary: 396 mg/dL — ABNORMAL HIGH (ref 70–99)
Glucose-Capillary: 220 mg/dL — ABNORMAL HIGH (ref 70–99)
Glucose-Capillary: 96 mg/dL (ref 70–99)

## 2024-05-07 MED ORDER — GLIMEPIRIDE 2 MG PO TABS
2.0000 mg | ORAL_TABLET | Freq: Every day | ORAL | Status: DC
  Administered 2024-05-08 – 2024-05-09 (×2): 2 mg via ORAL
  Filled 2024-05-07 (×2): qty 1

## 2024-05-07 NOTE — Plan of Care (Signed)

## 2024-05-07 NOTE — Progress Notes (Signed)
 [facility]   ROUNDING   NOTE Bianca Yang FMW:[id number]  DOB: [birth year]  DOA: 05/03/2024  PCP: Patient, [name] Per  05/07/2024,3:32 PM  LOS: 4 days    Code Status:  Full code     From:  home    48 year old white female DM TY 2, previous PID, STD-cocaine abuse Previous admission 01/15/2020 through 01/20/2020 osteomyelitis fifth proximal digit managed conservatively with ID and orthopedic input Return to hospital 05/03/2024 left foot pain erythema tracking proximally intact pulses large ulcer along base of fifth metatarsal  Chronology  11/5 presented to [facility] with above complaints transferred to [facility] for Dr. Harden to see-in mild DKA insulin  drip started and rapidly discontinued-noncompliant on meds apparently    Pertinent imaging/studies till date  Glucose 534 gap 33 bicarb 10 WBC 13 vitals relatively stable other than heart rate in the 100s Left foot x-ray subtle lucency ill-definition of base of fifth metatarsal suggesting osteomyelitis 11/7 Dr. Harden  PROCEDURE:  AMPUTATION, FOOT, RAY,APPLICATION WOUND VAC peel I  in place.Local tissue transfer for wound closure 3 x 10 cm. Application of Kerecis micro graft with 1 g vancomycin  powder.   Assessment  & Plan :    Cellulitis lower extremity status post surgery 11/7 Deep wound cultures are pending--BC X2 from admission is pending continues on Unasyn  every 6 vancomycin  Q12 do not narrow until deep wound cultures are back Very clearly discussed ---Tylenol  1000 every 6 first choice, Naprosyn  500 twice daily breakthrough, Oxy IR 5-10 every 4 as needed send for pain-for breakthrough pain can use Dilaudid  injection--- de-escalate off of IV in the next 24 hours DKA on admission now resolved Uncontrolled diabetes mellitus A1c 11.6 secondary to noncompliance-apparently supposed be on metformin  as well as Levemir  50 Sugars remained in the 250-400 range continues metformin  1000 twice daily--- add Amaryl 2 mg with morning meal and continue SSI It is  unlikely she will comply with insulin  raising the risk of further need for operative management Mild metabolic alkalosis dating back to 2021 Etiology unclear resolved no further workup PID previously with other STDs H/o Previous cocaine abuse Substance abuse disorder Positive for cocaine in addition to opiates See above discussion  Data Reviewed today:   Sodium 137 potassium 4.3 BUN/creatinine 12/0.4 WBC 13.1 hemoglobin 10.1 platelet 237   DVT prophylaxis: SCD  Status is: Inpatient Inpatient need recovery    Dispo/Global plan: Unclear at this time   Time 20   Subjective:   Seems more comfortable  Objective + exam Vitals:   05/06/24 2007 05/06/24 2007 05/07/24 0340 05/07/24 0800  BP: (!) 120/58 (!) 120/58 136/81 132/71  Pulse: 80 80 88 78  Resp: 17 17 17 16   Temp: 98.2 F (36.8 C) 98.2 F (36.8 C) 98.4 F (36.9 C) 98.4 F (36.9 C)  TempSrc: Oral  Oral Oral  SpO2: 94% 94% 90% 90%  Weight:      Height:       Filed Weights   05/04/24 0700  Weight: 67.1 kg     Examination:  Coherent awake alert Chest clear Left foot and wound VAC in place Edentulous Poor hygiene/affect  Scheduled Meds:  acetaminophen   1,000 mg Oral Q6H   Chlorhexidine Gluconate Cloth  6 each Topical Daily   insulin  aspart  0-15 Units Subcutaneous TID WC   metFORMIN   1,000 mg Oral BID WC   mupirocin ointment  1 Application Nasal BID   naproxen   500 mg Oral BID WC   Continuous Infusions:  ampicillin -sulbactam (  UNASYN ) IV 3 g (05/07/24 0952)   vancomycin  750 mg (05/07/24 1209)   HYDROmorphone  (DILAUDID ) injection, ondansetron  **OR** ondansetron  (ZOFRAN ) IV, oxyCODONE , polyethylene glycol  [name], MD  [facility]

## 2024-05-07 NOTE — Progress Notes (Signed)
 Orthopedic Tech Progress Note Patient Details:  LAVITA PONTIUS 1976-02-03 [id number]  Ortho Devices Type of Ortho Device: Postop shoe/boot Ortho Device/Splint Location: at bedside, pt does not want it applied at this time Ortho Device/Splint Interventions: Ordered, Adjustment   Post Interventions Instructions Provided: Care of device, Adjustment of device  Alcee Sipos Ronal Brasil 05/07/2024, 6:35 PM

## 2024-05-07 NOTE — Progress Notes (Signed)
 Pharmacy Antibiotic Note  Bianca Yang is a 48 y.o. female admitted on 05/03/2024 with right DFI/cellulitis.  Pharmacy has been consulted for vancomycin  dosing; also on Unasyn .   Plan: - Continue vancomycin  750 mg IV Q12H (eAUC 410, SCr used 0.8, Vd 0.72)  - Unasyn  to 3 g IV q6h - Monitor renal function, clinical progress, cultures/sensitivities - F/U LOT and de-escalate as able - Vancomycin  levels ordered for tomorrow    Height: 5' 7.5 (171.5 cm) (pt report recent UC visit) Weight: 67.1 kg (148 lb) (per pt report from recent UC visit) IBW/kg (Calculated) : 62.75  Temp (24hrs), Avg:98.4 F (36.9 C), Min:98.2 F (36.8 C), Max:98.6 F (37 C)  Recent Labs  Lab 05/03/24 1415 05/03/24 1424 05/03/24 1612 05/04/24 0321 05/06/24 0752 05/07/24 0447  WBC 13.3*  --   --  16.6* 14.0* 13.1*  CREATININE 0.71  --   --  0.58 0.64 0.44  LATICACIDVEN  --  2.5* 2.2*  --   --   --     Estimated Creatinine Clearance: 84.3 mL/min (by C-G formula based on SCr of 0.44 mg/dL).    Allergies  Allergen Reactions   Sulfa Antibiotics Other (See Comments)    Unknown/ childhood allergy.     Antimicrobials this admission: Vanc 11/5 >> Unasyn  11/5 >>  Microbiology results: 11/5 BCx: NGTD 11/6 Staph aureus PCR + 11/7 abscess cx from left foot = Staphyloccoccus aureus, GPC, GNR   Thank you for involving pharmacy in this patient's care.  Feliciano Close, PharmD PGY2 Infectious Diseases Pharmacy Resident  05/07/2024 3:54 PM

## 2024-05-08 ENCOUNTER — Encounter (HOSPITAL_COMMUNITY): Payer: Self-pay | Admitting: Orthopedic Surgery

## 2024-05-08 DIAGNOSIS — L089 Local infection of the skin and subcutaneous tissue, unspecified: Secondary | ICD-10-CM | POA: Diagnosis not present

## 2024-05-08 DIAGNOSIS — E11628 Type 2 diabetes mellitus with other skin complications: Secondary | ICD-10-CM | POA: Diagnosis not present

## 2024-05-08 LAB — CULTURE, BLOOD (ROUTINE X 2)
Culture: NO GROWTH
Culture: NO GROWTH
Special Requests: ADEQUATE

## 2024-05-08 LAB — GLUCOSE, CAPILLARY
Glucose-Capillary: 214 mg/dL — ABNORMAL HIGH (ref 70–99)
Glucose-Capillary: 161 mg/dL — ABNORMAL HIGH (ref 70–99)
Glucose-Capillary: 71 mg/dL (ref 70–99)
Glucose-Capillary: 98 mg/dL (ref 70–99)

## 2024-05-08 LAB — BASIC METABOLIC PANEL WITH GFR
Anion gap: 9 (ref 5–15)
BUN: 8 mg/dL (ref 6–20)
CO2: 33 mmol/L — ABNORMAL HIGH (ref 22–32)
Calcium: 8.5 mg/dL — ABNORMAL LOW (ref 8.9–10.3)
Chloride: 97 mmol/L — ABNORMAL LOW (ref 98–111)
Creatinine, Ser: 0.44 mg/dL (ref 0.44–1.00)
GFR, Estimated: >60 mL/min (ref >60)
Glucose, Bld: 233 mg/dL — ABNORMAL HIGH (ref 70–99)
Potassium: 4.2 mmol/L (ref 3.5–5.1)
Sodium: 139 mmol/L (ref 135–145)

## 2024-05-08 LAB — VANCOMYCIN, TROUGH: Vancomycin Tr: 9 ug/mL — ABNORMAL LOW (ref 15–20)

## 2024-05-08 LAB — VANCOMYCIN, PEAK: Vancomycin Pk: 16 ug/mL — ABNORMAL LOW (ref 30–40)

## 2024-05-08 MED ORDER — LIVING WELL WITH DIABETES BOOK
Freq: Once | Status: AC
  Filled 2024-05-08: qty 1

## 2024-05-08 MED ORDER — DOXYCYCLINE HYCLATE 100 MG PO TABS
100.0000 mg | ORAL_TABLET | Freq: Two times a day (BID) | ORAL | Status: DC
  Administered 2024-05-08 – 2024-05-09 (×3): 100 mg via ORAL
  Filled 2024-05-08 (×3): qty 1

## 2024-05-08 NOTE — Evaluation (Signed)
 Physical Therapy Evaluation Patient Details Name: Bianca Yang MRN: [id number] DOB: 05-10-1976 Today's Date: 05/08/2024  History of Present Illness  Pt is 48 yo female who presents on 05/03/24 with L foot pain x several weeks, with osteomyelitis. Underwent L 5th ray amputation on 11/7. PMH: DM, cocaine abuse  Clinical Impression  Pt admitted with above diagnosis. Pt from home in an apt alone with 4 large concrete steps to enter, no handrail. Pt reports that she has no support, no way to get food unless someone stops by and takes her to a food bank and no way to do laundry. Sounds like situation is very poor for adequate healing from current surgery. Pt assisted in donning post op shoe. Explained WB status and use of AD with her but she gets overwhelmed very easily and could not take in all info at once. Recommend further PT for training. Pt unable to ambulate and keep LLE fully NWB. Per Dr Harden she can place L heel on floor for balance but her comprehension of this and keeping wt largely off of it was minimal. Tolerated 15' ambulation with RW and then reported pain too high to keep going. Patient will benefit from continued inpatient follow up therapy, <3 hours/day as best scenario for her to heal. If this is not possible, recommend any additional social supports that she qualifies for.  Pt currently with functional limitations due to the deficits listed below (see PT Problem List). Pt will benefit from acute skilled PT to increase their independence and safety with mobility to allow discharge.           If plan is discharge home, recommend the following: Help with stairs or ramp for entrance;Assist for transportation;Assistance with cooking/housework;A little help with walking and/or transfers;A little help with bathing/dressing/bathroom   Can travel by private vehicle   Yes    Equipment Recommendations Rolling walker (2 wheels);BSC/3in1  Recommendations for Other Services        Functional Status Assessment Patient has had a recent decline in their functional status and demonstrates the ability to make significant improvements in function in a reasonable and predictable amount of time.     Precautions / Restrictions Precautions Precautions: Fall Recall of Precautions/Restrictions: Impaired Precaution/Restrictions Comments: impulsive, low health literacy and gets overwhelmed when explaining DME and WB status. Wound vac Restrictions Weight Bearing Restrictions Per Provider Order: Yes LLE Weight Bearing Per Provider Order: Non weight bearing Other Position/Activity Restrictions: per Dr Harden, may put minimal amt of wt through L heel if needed for stability      Mobility  Bed Mobility Overal bed mobility: Needs Assistance Bed Mobility: Supine to Sit     Supine to sit: Supervision     General bed mobility comments: supervision for safety and mgmt of wound vac line and IV    Transfers Overall transfer level: Needs assistance Equipment used: Rolling walker (2 wheels) Transfers: Sit to/from Stand Sit to Stand: Contact guard assist           General transfer comment: vc's for hand placement and keeping LLE NWB. Needed assist to don post op shoe    Ambulation/Gait Ambulation/Gait assistance: Min assist Gait Distance (Feet): 15 Feet Assistive device: Rolling walker (2 wheels) Gait Pattern/deviations: Step-to pattern, Antalgic Gait velocity: decreased Gait velocity interpretation: <1.31 ft/sec, indicative of household ambulator   General Gait Details: pt with increased L foot pain with foot dependent. Unable to keep LLE NWB with ambulation, cues to minimize wt through L heel when  stepping R foot, pt with limited ability to do this.  Stairs            Wheelchair Mobility     Tilt Bed    Modified Rankin (Stroke Patients Only)       Balance Overall balance assessment: Needs assistance Sitting-balance support: Feet supported, No upper  extremity supported Sitting balance-Leahy Scale: Good Sitting balance - Comments: EOB   Standing balance support: Bilateral upper extremity supported Standing balance-Leahy Scale: Poor Standing balance comment: needs UE support to limit LLE pressure and maintain balance                             Pertinent Vitals/Pain Pain Assessment Pain Assessment: Faces Faces Pain Scale: Hurts even more Pain Location: L foot Pain Descriptors / Indicators: Aching, Sore Pain Intervention(s): Limited activity within patient's tolerance    Home Living Family/patient expects to be discharged to:: Private residence Living Arrangements: Alone Available Help at Discharge: Other (Comment) (none) Type of Home: Apartment Home Access: Stairs to enter Entrance Stairs-Rails: None Entrance Stairs-Number of Steps: 4 large, concrete steps   Home Layout: One level Home Equipment: None Additional Comments: reports that she has no help, no way to get food unless someone comes by and takes her to the food bank, no way to get clothes washed    Prior Function Prior Level of Function : Independent/Modified Independent             Mobility Comments: no AD, does not drive, does not work ADLs Comments: independent     Extremity/Trunk Assessment   Upper Extremity Assessment Upper Extremity Assessment: Overall WFL for tasks assessed    Lower Extremity Assessment Lower Extremity Assessment: LLE deficits/detail LLE Deficits / Details: L 5th ray amp LLE Sensation: decreased proprioception;decreased light touch LLE Coordination: decreased gross motor    Cervical / Trunk Assessment Cervical / Trunk Assessment: Normal  Communication   Communication Communication: Impaired (intermittent word finding difficulties)    Cognition Arousal: Alert Behavior During Therapy: Anxious   PT - Cognitive impairments: Problem solving, Safety/Judgement                       PT - Cognition  Comments: cannot take in much information in at one time Following commands: Impaired Following commands impaired: Follows one step commands inconsistently     Cueing Cueing Techniques: Verbal cues     General Comments      Exercises     Assessment/Plan    PT Assessment Patient needs continued PT services  PT Problem List Decreased activity tolerance;Decreased balance;Decreased mobility;Decreased cognition;Decreased knowledge of precautions;Decreased safety awareness;Decreased knowledge of use of DME;Pain;Decreased skin integrity;Impaired sensation       PT Treatment Interventions DME instruction;Gait training;Stair training;Functional mobility training;Therapeutic activities;Therapeutic exercise;Balance training;Patient/family education    PT Goals (Current goals can be found in the Care Plan section)  Acute Rehab PT Goals Patient Stated Goal: get better, be able to walk PT Goal Formulation: With patient Time For Goal Achievement: 05/22/24 Potential to Achieve Goals: Fair    Frequency Min 2X/week     Co-evaluation               AM-PAC PT 6 Clicks Mobility  Outcome Measure Help needed turning from your back to your side while in a flat bed without using bedrails?: None Help needed moving from lying on your back to sitting on the side of a flat bed  without using bedrails?: None Help needed moving to and from a bed to a chair (including a wheelchair)?: A Little Help needed standing up from a chair using your arms (e.g., wheelchair or bedside chair)?: A Little Help needed to walk in hospital room?: A Lot Help needed climbing 3-5 steps with a railing? : Total 6 Click Score: 17    End of Session Equipment Utilized During Treatment: Gait belt Activity Tolerance: Patient limited by pain Patient left: in chair;with call bell/phone within reach Nurse Communication: Mobility status PT Visit Diagnosis: Pain;Difficulty in walking, not elsewhere classified (R26.2) Pain -  Right/Left: Left Pain - part of body: Ankle and joints of foot    Time: 9082-9053 PT Time Calculation (min) (ACUTE ONLY): 29 min   Charges:   PT Evaluation $PT Eval Moderate Complexity: 1 Mod PT Treatments $Gait Training: 8-22 mins PT General Charges $$ ACUTE PT VISIT: 1 Visit         Richerd Lipoma, PT  Acute Rehab Services Secure chat preferred Office [phone]   Richerd CROME Jalaina Salyers 05/08/2024, 11:25 AM

## 2024-05-08 NOTE — TOC Progression Note (Signed)
 Transition of Care [facility]) - Progression Note    Patient Details  Name: Bianca Yang MRN: [id number] Date of Birth: July 25, 1975  [facility]) CM/SW Contact  Roxie KANDICE Stain, RN Phone Number: 05/08/2024, 4:36 PM  Clinical Narrative:    Patient agreeable to OP rehab. Referral sent to [facility] health rehab @ [address]. Patient agreeable for [facility] to make pcp apt. Referral sent to CMA. Patient agreeable to DME-walker and BSC. Referral sent to [facility].   ICM (Inpatient Care Management) will continue to follow.  Expected Discharge Plan: OP Rehab Barriers to Discharge: Continued Medical Work up, Other (must enter comment) (lack of transportation)               Expected Discharge Plan and Services In-house Referral: Clinical Social Work   Post Acute Care Choice: Horticulturist, Commercial (OP PT) Living arrangements for the past 2 months: Single Family Home                                       Social Drivers of Health (SDOH) Interventions SDOH Screenings   Food Insecurity: Patient Declined (05/04/2024)  Housing: Patient Declined (05/04/2024)  Transportation Needs: Unknown (05/04/2024)  Utilities: Patient Declined (05/04/2024)  Tobacco Use: High Risk (05/05/2024)    Readmission Risk Interventions     No data to display

## 2024-05-08 NOTE — Inpatient Diabetes Management (Addendum)
 Inpatient Diabetes Program Recommendations  AACE/ADA: New Consensus Statement on Inpatient Glycemic Control (2015)  Target Ranges:  Prepandial:   less than 140 mg/dL      Peak postprandial:   less than 180 mg/dL (1-2 hours)      Critically ill patients:  140 - 180 mg/dL   Lab Results  Component Value Date   GLUCAP 161 (H) 05/08/2024   HGBA1C 11.6 (H) 05/04/2024    Review of Glycemic Control  Diabetes history: DM2  Outpatient Diabetes medications:  Levemir  50 units at bedtime Metfomrin XR 1000 mg QAM Patient states she was NOT taking any medications for diabetes prior to admission.   Current orders for Inpatient glycemic control:  Novolog  0-15 units TID  Amaryl 2mg  daily  Metformin  1,000mg  BID   Inpatient Diabetes Program Recommendations:   Spoke with patient at bedside. Discussed A1C results of 11.6% and explained what an A1C is and informed patient that his current A1C indicates an average glucose of 286mg /dl over the past 2-3 months. Patient states she has not taken any medications for diabetes in years. She was once prescribed Novolog  (about 5 years ago) but never took it.  Patient does not have a PCP and expresses her frustration with not knowing what resources are available to her. She has Medicaid.  She does not have transportation and walks to local stores when needed - this will now be an issues post surgery.  Patient requesting social worker to assist her with outpatient resources such as PCP, transportation, etc.   Discussed basic pathophysiology of DM Type 2, basic home care, importance of checking CBGs and maintaining good CBG control to prevent long-term and short-term complications. Reviewed glucose and A1C goals. Reviewed signs and symptoms of hyperglycemia and hypoglycemia along with treatment for both. Discussed impact of nutrition, exercise, stress, sickness, and medications on diabetes control. Patient states her meals are very inconsistent and eats whatever she  can find. She did admit to drinking regular sodas and surgery drinks. Encouraged patient to avoid regular drinks and only drink water or diet.  Patient states she does NOT have a meter to check her CBG at home. Will request for Rx for meter at discharge. Asked patient to check her glucose 4 times per day (before meals and at bedtime) and to keep a log book of glucose readings. Explained how the doctor she follows up with can use the log book to continue to make insulin  adjustments if needed.  Educated patient on insulin  pen use at home. Reviewed all steps of insulin  pen including attachment of needle, 2-unit air shot, dialing up dose, giving injection, removing needle, disposal of sharps, storage of unused insulin , disposal of insulin  etc. Patient able to provide successful demonstration. Also reviewed troubleshooting with insulin  pen.  MD to give patient Rxs for insulin  pens and insulin  pen needles if necessary.    - Noted Living Well with Diabetes Book was ordered, patient states she never recicved it. LWWD book ordered- spoke with bedside RN and made her aware.  - Consult for Social Work order placed.   Discharge Recommendations: Long acting recommendations: Insulin  Glargine (LANTUS) Solostar Pen Dose TBD at time of Discharge  Supply/Referral recommendations: Glucometer Test strips Lancet device Lancets Pen needles - standard Rx to restart Metformin  as well   Use Adult Diabetes Insulin  Treatment Post Discharge order set.  Thanks, Lavanda Search, RN, MSN, [facility]  Inpatient Diabetes Coordinator  Pager [phone] (8a-5p)

## 2024-05-08 NOTE — Anesthesia Postprocedure Evaluation (Signed)
 Anesthesia Post Note  Patient: Bianca Yang  Procedure(s) Performed: AMPUTATION, FOOT, RAY (Left) APPLICATION, WOUND VAC     Patient location during evaluation: PACU Anesthesia Type: General Level of consciousness: awake and alert Pain management: pain level controlled Vital Signs Assessment: post-procedure vital signs reviewed and stable Respiratory status: spontaneous breathing, nonlabored ventilation and respiratory function stable Cardiovascular status: blood pressure returned to baseline and stable Postop Assessment: no apparent nausea or vomiting Anesthetic complications: no   No notable events documented.  Last Vitals:    Last Pain:                 Garnette FORBES Skillern

## 2024-05-08 NOTE — Progress Notes (Signed)
 TRH   ROUNDING   NOTE Bianca Yang FMW:[id number]  DOB: [birth year]  DOA: 05/03/2024  PCP: Patient, [name] Per  05/08/2024,4:29 PM  LOS: 5 days    Code Status:  Full code     From:  home    48 year old white female DM TY 2, previous PID, STD-cocaine abuse Previous admission 01/15/2020 through 01/20/2020 osteomyelitis fifth proximal digit managed conservatively with ID and orthopedic input Return to hospital 05/03/2024 left foot pain erythema tracking proximally intact pulses large ulcer along base of fifth metatarsal  Chronology  11/5 presented to [facility] with above complaints transferred to [facility] for Dr. Harden to see-in mild DKA insulin  drip started and rapidly discontinued-noncompliant on meds apparently    Pertinent imaging/studies till date  Glucose 534 gap 33 bicarb 10 WBC 13 vitals relatively stable other than heart rate in the 100s Left foot x-ray subtle lucency ill-definition of base of fifth metatarsal suggesting osteomyelitis 11/7 Dr. Harden  PROCEDURE:  AMPUTATION, FOOT, RAY,APPLICATION WOUND VAC   Assessment  & Plan :    Cellulitis lower extremity status post surgery 11/7 MRSA growing --d/w ID pharm--- has tetracycline sensitive okay for doxycycline  for several weeks Continues Tylenol  1000 every 6 first choice, Naprosyn  500 twice daily breakthrough, Oxy IR 5-10 every 4 --as needed Dilaudid  in place Wishes to go home with home health-social worker aware to try to help set up for Medicaid transport and patient will have to call to set this up--- will discuss with Dr. Harden to ensure can follow-up with him etc. Will need DME outpatient PT DKA on admission now resolved Uncontrolled diabetes mellitus A1c 11.6 secondary to noncompliance-apparently supposed be on metformin  as well as Levemir  50 Sugars improved 71-2 14 eating 100% of meals It is unlikely she will comply with insulin  Continue oral Amaryl 2 mg metformin  1000 twice daily and sliding scale while hospitalized Mild  metabolic alkalosis dating back to 2021 Some variation in slightly alkalotic Follow-up in the outpatient setting with labs PID previously with other STDs H/o Previous cocaine abuse Substance abuse disorder Positive for cocaine in addition to opiates See above discussion--- she understands very limited opiates 3 days will be prescribed to her given UDS being positive She understands she will need multimodal pain management as an outpatient  Data Reviewed today:   Sodium 137 potassium 4.3 BUN/creatinine 12/0.4 WBC 13.1 hemoglobin 10.1 platelet 237   DVT prophylaxis: SCD  Status is: Inpatient Inpatient need recovery    Dispo/Global plan: Unclear at this time   Time 20   Subjective:   Seems more comfortable Sitting in chair worked with therapy today Expressed concerns about food insecurity, wants to talk to social worker but definitely wants to go home Pain is moderate  Objective + exam Vitals:   05/07/24 2016 05/08/24 0359 05/08/24 0725 05/08/24 1438  BP: 120/63 116/73 133/71 128/67  Pulse: 74 78 76 76  Resp:  16 17 16   Temp: 98.2 F (36.8 C) 98.3 F (36.8 C) 98.5 F (36.9 C) 98.6 F (37 C)  TempSrc:  Oral Oral   SpO2: 96% 98% 91% 95%  Weight:      Height:       Filed Weights   05/04/24 0700  Weight: 67.1 kg     Examination:  Coherent awake alert Chest clear Left foot and wound VAC in place Poor dentition Disheveled S1-S2 no murmur Abdomen soft  Scheduled Meds:  acetaminophen   1,000 mg Oral Q6H   doxycycline   100 mg  Oral Q12H   glimepiride  2 mg Oral Q breakfast   insulin  aspart  0-15 Units Subcutaneous TID WC   metFORMIN   1,000 mg Oral BID WC   mupirocin ointment  1 Application Nasal BID   naproxen   500 mg Oral BID WC   Continuous Infusions:   HYDROmorphone  (DILAUDID ) injection, ondansetron  **OR** ondansetron  (ZOFRAN ) IV, oxyCODONE , polyethylene glycol  [name], MD  [facility]

## 2024-05-08 NOTE — TOC Initial Note (Signed)
 Transition of Care [facility]) - Initial/Assessment Note    Patient Details  Name: Bianca Yang MRN: [id number] Date of Birth: [birth year]  Transition [facility]) CM/SW Contact:    Bridget Cordella Simmonds, LCSW Phone Number: 05/08/2024, 4:09 PM  Clinical Narrative:   CSW met with pt regarding PT recommendation for SNF.  Pt from home alone, no current services.  Pt not interested in SNF, prefers to DC home and pursue OP PT from there.  Pt reports no DME in the home.  Pt with [employer] medicaid, reports transportation is issue.  Discussed medicaid tranport: pt does not have this set up.  CSW provided phone number for [employer] medicaid, pt will call to start process.  Also discussed SDOH: food.  Pt reports $29/month food stamps, reports she has received assistance from churches to get by.  Currently does not have income.          RNCM notified of plan for DC home with DME and OP PT.           Expected Discharge Plan: OP Rehab Barriers to Discharge: Continued Medical Work up, Other (must enter comment) (lack of transportation)   Patient Goals and CMS Choice Patient states their goals for this hospitalization and ongoing recovery are:: get my diabetes under control          Expected Discharge Plan and Services In-house Referral: Clinical Social Work   Post Acute Care Choice: Horticulturist, Commercial (OP PT) Living arrangements for the past 2 months: Single Family Home                                      Prior Living Arrangements/Services Living arrangements for the past 2 months: Single Family Home Lives with:: Self Patient language and need for interpreter reviewed:: Yes Do you feel safe going back to the place where you live?: Yes          Current home services: Other (comment) (none) Criminal Activity/Legal Involvement Pertinent to Current Situation/Hospitalization: No - Comment as needed  Activities of Daily Living      Permission Sought/Granted                   Emotional Assessment Appearance:: Appears stated age Attitude/Demeanor/Rapport: Engaged Affect (typically observed): Appropriate, Pleasant Orientation: : Oriented to Self, Oriented to Place, Oriented to  Time, Oriented to Situation      Admission diagnosis:  Hyperglycemia [R73.9] Diabetic foot infection (HCC) [Z88.371, L08.9] Patient Active Problem List   Diagnosis Date Noted   Cutaneous abscess of left foot 05/04/2024   Diabetic foot infection (HCC) 05/03/2024   Trichomonas vaginitis 01/17/2020   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 [place] [address] Phone: [phone] Fax: [phone]  [facility] [address] [place]), [place] - [address] [place] [address] Phone: [phone] Fax: [phone]  [facility] [address] [place] [address] Phone: [phone] Fax: [phone]  [place] [facility] [address] [place] [address] Phone: [phone] Fax: [phone]     Social Drivers of Health (SDOH) Social History: SDOH Screenings   Food Insecurity: Patient Declined (05/04/2024)  Housing: Patient Declined (05/04/2024)  Transportation Needs: Unknown (05/04/2024)  Utilities: Patient Declined (05/04/2024)  Tobacco Use: High Risk (05/05/2024)   SDOH Interventions: Food Insecurity  Interventions: Patient Declined Housing Interventions: Patient Declined Transportation Interventions: Patient Declined Utilities Interventions: Patient Declined   Readmission Risk Interventions     No data to display

## 2024-05-09 ENCOUNTER — Other Ambulatory Visit (HOSPITAL_COMMUNITY): Payer: Self-pay

## 2024-05-09 DIAGNOSIS — E11628 Type 2 diabetes mellitus with other skin complications: Secondary | ICD-10-CM | POA: Diagnosis not present

## 2024-05-09 DIAGNOSIS — L089 Local infection of the skin and subcutaneous tissue, unspecified: Secondary | ICD-10-CM | POA: Diagnosis not present

## 2024-05-09 LAB — BASIC METABOLIC PANEL WITH GFR
Anion gap: 9 (ref 5–15)
BUN: 11 mg/dL (ref 6–20)
CO2: 33 mmol/L — ABNORMAL HIGH (ref 22–32)
Calcium: 8.7 mg/dL — ABNORMAL LOW (ref 8.9–10.3)
Chloride: 95 mmol/L — ABNORMAL LOW (ref 98–111)
Creatinine, Ser: 0.46 mg/dL (ref 0.44–1.00)
GFR, Estimated: >60 mL/min (ref >60)
Glucose, Bld: 130 mg/dL — ABNORMAL HIGH (ref 70–99)
Potassium: 4.3 mmol/L (ref 3.5–5.1)
Sodium: 137 mmol/L (ref 135–145)

## 2024-05-09 LAB — GLUCOSE, CAPILLARY: Glucose-Capillary: 206 mg/dL — ABNORMAL HIGH (ref 70–99)

## 2024-05-09 MED ORDER — OXYCODONE HCL 5 MG PO TABS
5.0000 mg | ORAL_TABLET | ORAL | 0 refills | Status: DC | PRN
  Filled 2024-05-09: qty 15, 2d supply, fill #0

## 2024-05-09 MED ORDER — POLYETHYLENE GLYCOL 3350 17 GM/SCOOP PO POWD
17.0000 g | Freq: Every day | ORAL | 0 refills | Status: DC | PRN
  Filled 2024-05-09: qty 238, 14d supply, fill #0

## 2024-05-09 MED ORDER — GLIMEPIRIDE 2 MG PO TABS
2.0000 mg | ORAL_TABLET | Freq: Every day | ORAL | 3 refills | Status: DC
  Filled 2024-05-09: qty 30, 30d supply, fill #0

## 2024-05-09 MED ORDER — METFORMIN HCL 1000 MG PO TABS
1000.0000 mg | ORAL_TABLET | Freq: Two times a day (BID) | ORAL | 3 refills | Status: DC
  Filled 2024-05-09: qty 60, 30d supply, fill #0

## 2024-05-09 MED ORDER — NAPROXEN 500 MG PO TABS
500.0000 mg | ORAL_TABLET | Freq: Two times a day (BID) | ORAL | 0 refills | Status: AC
  Filled 2024-05-09: qty 60, 30d supply, fill #0

## 2024-05-09 MED ORDER — DOXYCYCLINE HYCLATE 100 MG PO TABS
100.0000 mg | ORAL_TABLET | Freq: Two times a day (BID) | ORAL | 0 refills | Status: AC
  Filled 2024-05-09: qty 18, 9d supply, fill #0

## 2024-05-09 MED ORDER — ACETAMINOPHEN 500 MG PO TABS
1000.0000 mg | ORAL_TABLET | Freq: Four times a day (QID) | ORAL | 0 refills | Status: AC
  Filled 2024-05-09: qty 30, 4d supply, fill #0

## 2024-05-09 MED ORDER — LANCET DEVICE MISC
1.0000 | 0 refills | Status: DC
  Filled 2024-05-09: qty 1, fill #0

## 2024-05-09 MED ORDER — BLOOD GLUCOSE TEST VI STRP
1.0000 | ORAL_STRIP | 0 refills | Status: DC
  Filled 2024-05-09: qty 100, 25d supply, fill #0

## 2024-05-09 MED ORDER — ACCU-CHEK SOFTCLIX LANCETS MISC
1.0000 | 0 refills | Status: DC
  Filled 2024-05-09: qty 100, 25d supply, fill #0

## 2024-05-09 MED ORDER — BLOOD GLUCOSE MONITOR SYSTEM W/DEVICE KIT
1.0000 | PACK | 0 refills | Status: DC
  Filled 2024-05-09: qty 1, 30d supply, fill #0

## 2024-05-09 NOTE — Progress Notes (Signed)
 After visit summary reviewed with patient. Patient verbalized understanding.  Patient received discharge education, DME, & prescribed medications.

## 2024-05-09 NOTE — Discharge Summary (Signed)
 Physician Discharge Summary  Bianca Yang FMW:[id number] DOB: 10-04-1975 DOA: 05/03/2024  PCP: Patient, [name] Per  Admit date: 05/03/2024 Discharge date: 05/09/2024  Time spent: 40 minutes  Recommendations for Outpatient Follow-up:  Get CBC Chem-12 as possible in 14 to 21 days and needs a PCP set up in the outpatient setting Very circumscribed amount of opiates called in for the patient-PDMP reviewed but drug screen was positive this hospital stay raising concern for surreptitious use etc. Very high risk for readmission if does not take meds and cannot get follow-up-explained to her warning signs  Discharge Diagnoses:  MAIN problem for hospitalization   Diabetic foot wound  Please see below for itemized issues addressed in HOpsital- refer to other progress notes for clarity if needed  Discharge Condition: Guarded  Diet recommendation: Diabetic  Filed Weights   05/04/24 0700  Weight: 67.1 kg    History of present illness:  48 year old white female DM TY 2, previous PID, STD-cocaine abuse Previous admission 01/15/2020 through 01/20/2020 osteomyelitis fifth proximal digit managed conservatively with ID and orthopedic input Return to hospital 05/03/2024 left foot pain erythema tracking proximally intact pulses large ulcer along base of fifth metatarsal   Chronology  11/5 presented to [facility] with above complaints transferred to [facility] for Dr. Harden to see-in mild DKA insulin  drip started and rapidly discontinued-noncompliant on meds apparently      Pertinent imaging/studies till date  Glucose 534 gap 33 bicarb 10 WBC 13 vitals relatively stable other than heart rate in the 100s Left foot x-ray subtle lucency ill-definition of base of fifth metatarsal suggesting osteomyelitis 11/7 Dr. Harden  PROCEDURE:  AMPUTATION, FOOT, RAY,APPLICATION WOUND VAC    Assessment  & Plan :      Cellulitis lower extremity status post surgery 11/7 MRSA growing --d/w ID pharm--- has  tetracycline sensitive okay for doxycycline  for several weeks Main pain control will be Tylenol  around-the-clock Naprosyn  scheduled with food and a very limited prescription of 15 tablets of Oxy IR 5-10 Wishes to go home with home health-social worker aware to try to help set up for Medicaid transport and patient will have to call to set this up--- wound VAC was removed as per Dr. Harden [facility] and [facility] ordered at discharge for patient Outpatient therapy services to see the patient as she was walking about 15 feet DKA on admission now resolved Uncontrolled diabetes mellitus A1c 11.6 secondary to noncompliance-apparently supposed be on metformin  as well as Levemir  50 She will not take insulin  she says and although it is not ideal we will place on Amaryl as well as metformin  1000 twice daily her sugars are better controlled after she complied with a diabetic diet She is at very high risk for micro and macrovascular complications of this and worsening wound Mild metabolic alkalosis dating back to 2021 Some variation in slightly alkalotic Follow-up in the outpatient setting with labs PID previously with other STDs H/o Previous cocaine abuse Substance abuse disorder Positive for cocaine in addition to opiates See above discussion--- we had a very clear discussion about very limited amounts of opiates and she understands I cannot prescribe greater than several days/doses of opiates because she had a positive drug screen    Discharge Exam: Vitals:   05/09/24 0440 05/09/24 0812  BP: (!) 144/68 125/73  Pulse: 65 72  Resp: 17 15  Temp: 98.3 F (36.8 C) 98.7 F (37.1 C)  SpO2: 94% 97%    Subj on day of d/c   Awake  coherent in good spirits seen up with walker  General Exam on discharge  EOMI NCAT no focal deficit no icterus no pallor Disheveled edentulous Abdomen soft no rebound Wound not examined today   Discharge Instructions   Discharge Instructions     Amb Referral to Nutrition  and Diabetic Education   Complete by: As directed    Ambulatory referral to Physical Therapy   Complete by: As directed    Diet - low sodium heart healthy   Complete by: As directed    Discharge instructions   Complete by: As directed    Make sure you follow-up with outpatient physician and get care in about 1 week and you will need a primary care physician we will contact you and try and get you a date and time to be seen for your wounds Ideally you should be on insulin -you have expressed an interest in only oral meds for simplicity sake--- if something changes with your financial set up and/or your ability to take insulin  it would probably be a good idea to use it Please make sure your sugars are well-controlled I will call in a meter for you to make sure that your sugars not above 3-400 as good sugar control is essential please minimize sweets and other things Dressings will be performed and you will be taught how to do this before you leave as per Dr. Harden   Increase activity slowly   Complete by: As directed    Leave dressing on - Keep it clean, dry, and intact until clinic visit   Complete by: As directed       Allergies as of 05/09/2024       Reactions   Sulfa Antibiotics Other (See Comments)   Unknown/ childhood allergy.         Medication List     STOP taking these medications    insulin  detemir 100 UNIT/ML injection Commonly known as: LEVEMIR    metFORMIN  500 MG 24 hr tablet Commonly known as: GLUCOPHAGE -XR Replaced by: metFORMIN  1000 MG tablet       TAKE these medications    acetaminophen  500 MG tablet Commonly known as: TYLENOL  Take 2 tablets (1,000 mg total) by mouth every 6 (six) hours.   Blood Glucose Monitoring Suppl Devi 1 each by Does not apply route as directed. Dispense based on patient and insurance preference. Use up to four times daily as directed. (FOR ICD-10 E10.9, E11.9).   BLOOD GLUCOSE TEST STRIPS Strp 1 each by Does not apply route as  directed. Dispense based on patient and insurance preference. Use up to four times daily as directed. (FOR ICD-10 E10.9, E11.9).   doxycycline  100 MG tablet Commonly known as: VIBRA -TABS Take 1 tablet (100 mg total) by mouth every 12 (twelve) hours for 9 days.   glimepiride 2 MG tablet Commonly known as: AMARYL Take 1 tablet (2 mg total) by mouth daily with breakfast. Start taking on: May 10, 2024   Lancet Device Misc 1 each by Does not apply route as directed. Dispense based on patient and insurance preference. Use up to four times daily as directed. (FOR ICD-10 E10.9, E11.9).   Lancets Misc 1 each by Does not apply route as directed. Dispense based on patient and insurance preference. Use up to four times daily as directed. (FOR ICD-10 E10.9, E11.9).   metFORMIN  1000 MG tablet Commonly known as: GLUCOPHAGE  Take 1 tablet (1,000 mg total) by mouth 2 (two) times daily with a meal. Replaces: metFORMIN  500 MG 24  hr tablet   naproxen  500 MG tablet Commonly known as: NAPROSYN  Take 1 tablet (500 mg total) by mouth 2 (two) times daily with a meal.   oxyCODONE  5 MG immediate release tablet Commonly known as: Oxy IR/ROXICODONE  Take 1-2 tablets (5-10 mg total) by mouth every 4 (four) hours as needed for severe pain (pain score 7-10).   polyethylene glycol 17 g packet Commonly known as: MIRALAX / GLYCOLAX Take 17 g by mouth daily as needed for mild constipation.               Durable Medical Equipment  (From admission, onward)           Start     Ordered   05/08/24 1624  For home use only DME Bedside commode  Once       Question:  Patient needs a bedside commode to treat with the following condition  Answer:  Physical deconditioning   05/08/24 1624   05/08/24 1624  For home use only DME Walker rolling  Once       Question Answer Comment  Walker: With 5 Inch Wheels   Patient needs a walker to treat with the following condition Physical deconditioning      05/08/24  1624              Discharge Care Instructions  (From admission, onward)           Start     Ordered   05/09/24 0000  Leave dressing on - Keep it clean, dry, and intact until clinic visit        05/09/24 1125           Allergies  Allergen Reactions   Sulfa Antibiotics Other (See Comments)    Unknown/ childhood allergy.     Follow-up Information     Harden Jerona GAILS, MD Follow up in 1 week(s).   Specialty: Orthopedic Surgery Contact information: [address] [place]  [place] [address] [phone]         [facility] Follow up.   Specialty: Rehabilitation Why: call to schedule apt for physical thearpy Contact information: [address] [place]  [address] [phone]        Nedra Tinnie LABOR, NP Follow up.   Specialty: Internal Medicine Why: TIME : 9:00 AM   PLEASE ARRIVE AT 8:30 AM DATE : CLYDENE DARYEL ALVINE MONDAY  PLEASE BRING ALL CURRENT MEDICATION, ID and INS CARD Contact information: [address] [place] [address] [phone]                  The results of significant diagnostics from this hospitalization (including imaging, microbiology, ancillary and laboratory) are listed below for reference.    Significant Diagnostic Studies: VAS US  ABI WITH/WO TBI Result Date: 05/04/2024  LOWER EXTREMITY DOPPLER STUDY Patient Name:  Bianca Yang  Date of Exam:   05/04/2024 Medical Rec #: [id number]          Accession #:    [id number] Date of Birth: Apr 10, 1976          Patient Gender: F Patient Age:   48 years Exam Location:  [facility] Procedure:      VAS US  ABI WITH/WO TBI Referring Phys: MICHAEL JEFFERY --------------------------------------------------------------------------------  Indications: Ulceration. Pain, swelling, erythema. High Risk Factors: Diabetes, current smoker.  Limitations: Today's exam was limited due to patient unable to lie flat,  patient  intolerant to cuff pressure, involuntary patient movement and              slightly combative, unable to obtain pressures in the left lower              extremity due to patient's pain. Declined to continue exam. Comparison Study: Previous study on 5.11.2018. Performing Technologist: Edilia Elden Appl  Examination Guidelines: A complete evaluation includes at minimum, Doppler waveform signals and systolic blood pressure reading at the level of bilateral brachial, anterior tibial, and posterior tibial arteries, when vessel segments are accessible. Bilateral testing is considered an integral part of a complete examination. Photoelectric Plethysmograph (PPG) waveforms and toe systolic pressure readings are included as required and additional duplex testing as needed. Limited examinations for reoccurring indications may be performed as noted.  ABI Findings: +--------+------------------+-----+-----------+--------+ Right   Rt Pressure (mmHg)IndexWaveform   Comment  +--------+------------------+-----+-----------+--------+ Brachial101                    triphasic           +--------+------------------+-----+-----------+--------+ PTA     112               1.11 triphasic           +--------+------------------+-----+-----------+--------+ DP      106               1.05 multiphasic         +--------+------------------+-----+-----------+--------+ +---------+------------------+-----+-----------+-----------+ Left     Lt Pressure (mmHg)IndexWaveform   Comment     +---------+------------------+-----+-----------+-----------+ Brachial 100                    triphasic              +---------+------------------+-----+-----------+-----------+ PTA                             multiphasic            +---------+------------------+-----+-----------+-----------+ DP                              multiphasic             +---------+------------------+-----+-----------+-----------+ Great Toe                                  Unobtained. +---------+------------------+-----+-----------+-----------+ +-------+-----------+-----------+------------+------------+ ABI/TBIToday's ABIToday's TBIPrevious ABIPrevious TBI +-------+-----------+-----------+------------+------------+ Right  1.11                                           +-------+-----------+-----------+------------+------------+ Left   Unobtained.                                    +-------+-----------+-----------+------------+------------+ Limited exam due to patient's pain intolerance. Unable to tolerate cuff pressure, only waveforms documented on the left lower extremity. Toe waveforms and pressures were not obtained due to patient declining to continue. Patient unable to lie still limiting integrity of waveforms.  Summary: Left:  Left ABI unobtained, waveforms documented. *See table(s) above for measurements and observations.  Electronically signed by Norman Serve on 05/04/2024 at 7:03:37 PM.    Final    DG Foot Complete Left Result  Date: 05/03/2024 CLINICAL DATA:  Left foot pain and swelling with redness 1 week. Evaluate for osteomyelitis. EXAM: LEFT FOOT - COMPLETE 3+ VIEW COMPARISON:  11/04/2016 FINDINGS: No evidence of acute fracture or dislocation. Air within the soft tissues over the lateral aspect of the foot at the level of the base of the fifth metatarsal as well as mottled air over the lateral soft tissues adjacent the distal aspect of the fifth metatarsal. The soft tissue changes likely represent soft tissue infection/cellulitis. There is subtle lucency and ill definition of the base of the fifth metatarsal suggesting osteomyelitis. Small vessel atherosclerotic disease is present. Remainder of the exam is unremarkable. IMPRESSION: Findings suggesting soft tissue infection/cellulitis over the lateral aspect of the foot with subtle lucency and  ill definition of the base of the fifth metatarsal suggesting osteomyelitis. Electronically Signed   By: Toribio Agreste M.D.   On: 05/03/2024 17:02    Microbiology: Recent Results (from the past 240 hours)  Blood Cultures x 2 sites     Status: None   Collection Time: 05/03/24  3:59 PM   Specimen: BLOOD  Result Value Ref Range Status   Specimen Description   Final    BLOOD LEFT ANTECUBITAL Performed at [facility], [address] [place], [place] [address]    Special Requests   Final    BOTTLES DRAWN AEROBIC AND ANAEROBIC Blood Culture adequate volume Performed at [facility], [address], [place], [place] [address]    Culture   Final    NO GROWTH 5 DAYS Performed at [facility], [address] [place], [place] [address]    Report Status 05/08/2024 FINAL  Final  Blood Cultures x 2 sites     Status: None   Collection Time: 05/03/24  4:31 PM   Specimen: BLOOD  Result Value Ref Range Status   Specimen Description   Final    BLOOD RIGHT ANTECUBITAL Performed at [facility], [address], [place], [place] [address]    Special Requests   Final    BOTTLES DRAWN AEROBIC AND ANAEROBIC Blood Culture results may not be optimal due to an inadequate volume of blood received in culture bottles Performed at [facility], [address] [place], [place] [address]    Culture   Final    NO GROWTH 5 DAYS Performed at [facility], [address], [place], [place] [address]    Report Status 05/08/2024 FINAL  Final  Surgical pcr screen     Status: Abnormal   Collection Time: 05/04/24  4:15 AM   Specimen: Nasal Mucosa; Nasal Swab  Result Value Ref Range Status   MRSA, PCR NEGATIVE NEGATIVE Final   Staphylococcus aureus POSITIVE (A) NEGATIVE Final    Comment: (NOTE) The Xpert SA Assay (FDA approved for NASAL specimens in patients 49 years of age and older), is one component of a  comprehensive surveillance program. It is not intended to diagnose infection nor to guide or monitor treatment. Performed at [facility], [address], [place], [place] [address]   Aerobic/Anaerobic Culture w Gram Stain (surgical/deep wound)     Status: None (Preliminary result)   Collection Time: 05/05/24  1:09 PM   Specimen: Abscess  Result Value Ref Range Status   Specimen Description TISSUE  Final   Special Requests ABSCESS from left foot  Final   Gram Stain   Final    FEW WBC PRESENT, PREDOMINANTLY PMN  FEW GRAM POSITIVE COCCI RARE GRAM NEGATIVE RODS Performed at [facility], [address], [place], [place] [address]    Culture   Final    RARE METHICILLIN RESISTANT STAPHYLOCOCCUS AUREUS FEW STREPTOCOCCUS CONSTELLATUS NO ANAEROBES ISOLATED; CULTURE IN PROGRESS FOR 5 DAYS    Report Status PENDING  Incomplete   Organism ID, Bacteria METHICILLIN RESISTANT STAPHYLOCOCCUS AUREUS  Final   Organism ID, Bacteria STREPTOCOCCUS CONSTELLATUS  Final      Susceptibility   Streptococcus constellatus - MIC*    PENICILLIN  <=0.06 SENSITIVE Sensitive     CEFTRIAXONE  <=0.12 SENSITIVE Sensitive     ERYTHROMYCIN <=0.12 SENSITIVE Sensitive     LEVOFLOXACIN <=0.25 SENSITIVE Sensitive     VANCOMYCIN  0.25 SENSITIVE Sensitive     * FEW STREPTOCOCCUS CONSTELLATUS   Methicillin resistant staphylococcus aureus - MIC*    CIPROFLOXACIN >=8 RESISTANT Resistant     ERYTHROMYCIN >=8 RESISTANT Resistant     GENTAMICIN <=0.5 SENSITIVE Sensitive     OXACILLIN >=4 RESISTANT Resistant     TETRACYCLINE <=1 SENSITIVE Sensitive     VANCOMYCIN  1 SENSITIVE Sensitive     TRIMETH/SULFA >=320 RESISTANT Resistant     CLINDAMYCIN  <=0.25 SENSITIVE Sensitive     RIFAMPIN <=0.5 SENSITIVE Sensitive     Inducible Clindamycin  NEGATIVE Sensitive     LINEZOLID 2 SENSITIVE Sensitive     * RARE METHICILLIN RESISTANT STAPHYLOCOCCUS AUREUS     Labs: Basic Metabolic Panel: Recent Labs  Lab 05/04/24 0321  05/06/24 0752 05/07/24 0447 05/08/24 0619 05/09/24 0342  NA 136 134* 137 139 137  K 4.6 4.7 4.3 4.2 4.3  CL 94* 94* 97* 97* 95*  CO2 34* 29 28 33* 33*  GLUCOSE 141* 484* 262* 233* 130*  BUN 13 14 12 8 11   CREATININE 0.58 0.64 0.44 0.44 0.46  CALCIUM  8.3* 8.5* 8.6* 8.5* 8.7*   Liver Function Tests: Recent Labs  Lab 05/03/24 1415 05/06/24 0752  AST 16 12*  ALT 10 11  ALKPHOS 158* 105  BILITOT 0.4 0.2  PROT 8.1 6.4*  ALBUMIN 3.5 2.2*   No results for input(s): LIPASE, AMYLASE in the last 168 hours. No results for input(s): AMMONIA in the last 168 hours. CBC: Recent Labs  Lab 05/03/24 1415 05/04/24 0321 05/06/24 0752 05/07/24 0447  WBC 13.3* 16.6* 14.0* 13.1*  NEUTROABS 10.4*  --  11.1* 10.0*  HGB 13.0 12.2 10.5* 10.1*  HCT 44.3 39.6 34.0* 33.1*  MCV 89.7 89.0 88.3 89.2  PLT 255 271 239 237   Cardiac Enzymes: No results for input(s): CKTOTAL, CKMB, CKMBINDEX, TROPONINI in the last 168 hours. BNP: BNP (last 3 results) No results for input(s): BNP in the last 8760 hours.  ProBNP (last 3 results) No results for input(s): PROBNP in the last 8760 hours.  CBG: Recent Labs  Lab 05/08/24 0655 05/08/24 1249 05/08/24 1609 05/08/24 2227 05/09/24 0826  GLUCAP 214* 161* 71 98 206*    Signed:  Colen Grimes MD   [facility] 05/09/2024, 11:26 AM

## 2024-05-09 NOTE — Progress Notes (Signed)
 Patient ID: Bianca Yang, female   DOB: [birth year], 48 y.o.   MRN: [id number] Patient is up ambulating today.  There is no new drainage in the wound VAC canister.  Will discontinue the wound VAC in preparation for discharge.

## 2024-05-09 NOTE — TOC Progression Note (Signed)
 Transition of Care [facility]) - Progression Note    Patient Details  Name: BRYAHNA LESKO MRN: [id number] Date of Birth: 1975-11-11  Transition of [facility]) CM/SW Contact  Bridget Cordella Simmonds, LCSW Phone Number: 05/09/2024, 11:25 AM  Clinical Narrative:   Pt reports she left message with [facility] about transportation.  CSW encouraged her to call again, pt is aware of plan for DC today.  Food loss adjuster, chartered also provided.      Expected Discharge Plan: OP Rehab Barriers to Discharge: Continued Medical Work up, Other (must enter comment) (lack of transportation)               Expected Discharge Plan and Services In-house Referral: Clinical Social Work   Post Acute Care Choice: Horticulturist, Commercial (OP PT) Living arrangements for the past 2 months: Single Family Home Expected Discharge Date: 05/09/24                                     Social Drivers of Health (SDOH) Interventions SDOH Screenings   Food Insecurity: Patient Declined (05/04/2024)  Housing: Patient Declined (05/04/2024)  Transportation Needs: Unknown (05/04/2024)  Utilities: Patient Declined (05/04/2024)  Tobacco Use: High Risk (05/05/2024)    Readmission Risk Interventions     No data to display

## 2024-05-09 NOTE — Plan of Care (Signed)
  Problem: Pain Managment: Goal: General experience of comfort will improve and/or be controlled Outcome: Progressing   Problem: Safety: Goal: Ability to remain free from injury will improve Outcome: Progressing   Problem: Skin Integrity: Goal: Risk for impaired skin integrity will decrease Outcome: Progressing

## 2024-05-10 ENCOUNTER — Other Ambulatory Visit (HOSPITAL_COMMUNITY): Payer: Self-pay

## 2024-05-10 ENCOUNTER — Other Ambulatory Visit: Payer: Self-pay

## 2024-05-10 ENCOUNTER — Encounter: Payer: Self-pay | Admitting: Orthopedic Surgery

## 2024-05-10 ENCOUNTER — Ambulatory Visit: Payer: Self-pay

## 2024-05-10 LAB — AEROBIC/ANAEROBIC CULTURE W GRAM STAIN (SURGICAL/DEEP WOUND)

## 2024-05-10 MED ORDER — AMOXICILLIN-POT CLAVULANATE 875-125 MG PO TABS
1.0000 | ORAL_TABLET | Freq: Two times a day (BID) | ORAL | 0 refills | Status: DC
  Filled 2024-05-10: qty 14, 7d supply, fill #0

## 2024-05-10 NOTE — Telephone Encounter (Signed)
-----   Message from Jerona LULLA Sage sent at 05/10/2024 12:24 PM EST ----- Patient's cultures are showing 2 bacteria.  She does have MRSA that is sensitive to her doxycycline .  She also has a strep infection that is sensitive to Augmentin .  Please call in Augmentin  twice  daily for 7 days. ----- Message ----- From: Rebecka, Lab In [place] Sent: 05/10/2024  11:07 AM EST To: Jerona Sage LULLA, MD

## 2024-05-10 NOTE — Telephone Encounter (Signed)
 I called and pt did not answer phone. Asked to call back to discuss. Medication has been sent to pharmacy. Will hold and try again.

## 2024-05-11 ENCOUNTER — Other Ambulatory Visit: Payer: Self-pay | Admitting: Physician Assistant

## 2024-05-11 ENCOUNTER — Other Ambulatory Visit: Payer: Self-pay

## 2024-05-11 NOTE — Telephone Encounter (Signed)
-----   Message from Lionel LITTIE Pear, ARIZONA sent at 05/10/2024  4:23 PM EST -----   ----- Message ----- From: Harden Jerona GAILS, MD Sent: 05/10/2024  12:25 PM EST To: Lionel LITTIE Pear, RMA  Patient's cultures are showing 2 bacteria.  She does have MRSA that is sensitive to her doxycycline .  She also has a strep infection that is sensitive to Augmentin .  Please call in Augmentin  twice  daily for 7 days. ----- Message ----- From: Rebecka, Lab In [place] Sent: 05/10/2024  11:07 AM EST To: Jerona Harden GAILS, MD

## 2024-05-11 NOTE — Telephone Encounter (Signed)
 Called and lm on vm for pt to call the office and advise what pharmacy she would like her abx to go to.

## 2024-05-15 NOTE — Progress Notes (Signed)
 Message sent to pt through my chart to verify if the pharmacy called her to pick up rx.

## 2024-05-16 NOTE — Progress Notes (Signed)
 Another message sent through  my chart to ask to call the office for an appt and to confirm taking antibiotic.

## 2024-05-19 NOTE — Progress Notes (Signed)
 Left another message on vm advising trying to confirm taking abx and also to make a post op appt.

## 2024-05-29 NOTE — Telephone Encounter (Signed)
-----   Message from Lionel LITTIE Pear, ARIZONA sent at 05/10/2024  4:23 PM EST -----   ----- Message ----- From: Harden Jerona GAILS, MD Sent: 05/10/2024  12:25 PM EST To: Lionel LITTIE Pear, RMA  Patient's cultures are showing 2 bacteria.  She does have MRSA that is sensitive to her doxycycline .  She also has a strep infection that is sensitive to Augmentin .  Please call in Augmentin  twice  daily for 7 days. ----- Message ----- From: Rebecka, Lab In [place] Sent: 05/10/2024  11:07 AM EST To: Jerona Harden GAILS, MD

## 2024-05-29 NOTE — Telephone Encounter (Signed)
 I have called multiple times to confirm the patient is taking her antibiotic and to make a post operative appt for her ( she has not been evaluated since surgery) and I do not get a return call from the pt. I have sent my chart messages and left numerous voicemail's without success.

## 2024-06-04 ENCOUNTER — Emergency Department (HOSPITAL_COMMUNITY)

## 2024-06-04 ENCOUNTER — Other Ambulatory Visit: Payer: Self-pay

## 2024-06-04 ENCOUNTER — Encounter (HOSPITAL_COMMUNITY): Payer: Self-pay | Admitting: *Deleted

## 2024-06-04 ENCOUNTER — Inpatient Hospital Stay (HOSPITAL_COMMUNITY): Admission: EM | Admit: 2024-06-04 | Discharge: 2024-06-23 | Disposition: A | Discharge status: Home / Self Care

## 2024-06-04 DIAGNOSIS — F1410 Cocaine abuse, uncomplicated: Secondary | ICD-10-CM | POA: Diagnosis present

## 2024-06-04 DIAGNOSIS — R54 Age-related physical debility: Secondary | ICD-10-CM | POA: Diagnosis present

## 2024-06-04 DIAGNOSIS — Z792 Long term (current) use of antibiotics: Secondary | ICD-10-CM | POA: Diagnosis not present

## 2024-06-04 DIAGNOSIS — E8809 Other disorders of plasma-protein metabolism, not elsewhere classified: Secondary | ICD-10-CM | POA: Diagnosis present

## 2024-06-04 DIAGNOSIS — G4733 Obstructive sleep apnea (adult) (pediatric): Secondary | ICD-10-CM | POA: Diagnosis present

## 2024-06-04 DIAGNOSIS — T8781 Dehiscence of amputation stump: Secondary | ICD-10-CM | POA: Diagnosis present

## 2024-06-04 DIAGNOSIS — I427 Cardiomyopathy due to drug and external agent: Secondary | ICD-10-CM | POA: Diagnosis present

## 2024-06-04 DIAGNOSIS — M86172 Other acute osteomyelitis, left ankle and foot: Secondary | ICD-10-CM | POA: Diagnosis present

## 2024-06-04 DIAGNOSIS — J9621 Acute and chronic respiratory failure with hypoxia: Secondary | ICD-10-CM | POA: Diagnosis not present

## 2024-06-04 DIAGNOSIS — Z833 Family history of diabetes mellitus: Secondary | ICD-10-CM

## 2024-06-04 DIAGNOSIS — M869 Osteomyelitis, unspecified: Secondary | ICD-10-CM | POA: Diagnosis present

## 2024-06-04 DIAGNOSIS — Z882 Allergy status to sulfonamides status: Secondary | ICD-10-CM

## 2024-06-04 DIAGNOSIS — E871 Hypo-osmolality and hyponatremia: Secondary | ICD-10-CM | POA: Diagnosis present

## 2024-06-04 DIAGNOSIS — L089 Local infection of the skin and subcutaneous tissue, unspecified: Secondary | ICD-10-CM | POA: Diagnosis not present

## 2024-06-04 DIAGNOSIS — E1151 Type 2 diabetes mellitus with diabetic peripheral angiopathy without gangrene: Secondary | ICD-10-CM | POA: Diagnosis present

## 2024-06-04 DIAGNOSIS — M86012 Acute hematogenous osteomyelitis, left shoulder: Secondary | ICD-10-CM

## 2024-06-04 DIAGNOSIS — E1140 Type 2 diabetes mellitus with diabetic neuropathy, unspecified: Secondary | ICD-10-CM | POA: Diagnosis present

## 2024-06-04 DIAGNOSIS — Z89432 Acquired absence of left foot: Secondary | ICD-10-CM | POA: Diagnosis not present

## 2024-06-04 DIAGNOSIS — E11628 Type 2 diabetes mellitus with other skin complications: Secondary | ICD-10-CM | POA: Diagnosis present

## 2024-06-04 DIAGNOSIS — R400 Somnolence: Secondary | ICD-10-CM | POA: Diagnosis not present

## 2024-06-04 DIAGNOSIS — J9612 Chronic respiratory failure with hypercapnia: Secondary | ICD-10-CM | POA: Diagnosis not present

## 2024-06-04 DIAGNOSIS — J9601 Acute respiratory failure with hypoxia: Secondary | ICD-10-CM | POA: Diagnosis not present

## 2024-06-04 DIAGNOSIS — Z5941 Food insecurity: Secondary | ICD-10-CM

## 2024-06-04 DIAGNOSIS — R0689 Other abnormalities of breathing: Secondary | ICD-10-CM | POA: Diagnosis not present

## 2024-06-04 DIAGNOSIS — R918 Other nonspecific abnormal finding of lung field: Secondary | ICD-10-CM | POA: Diagnosis not present

## 2024-06-04 DIAGNOSIS — Y835 Amputation of limb(s) as the cause of abnormal reaction of the patient, or of later complication, without mention of misadventure at the time of the procedure: Secondary | ICD-10-CM | POA: Diagnosis present

## 2024-06-04 DIAGNOSIS — L03032 Cellulitis of left toe: Secondary | ICD-10-CM | POA: Diagnosis present

## 2024-06-04 DIAGNOSIS — E1165 Type 2 diabetes mellitus with hyperglycemia: Secondary | ICD-10-CM | POA: Diagnosis present

## 2024-06-04 DIAGNOSIS — Z5320 Procedure and treatment not carried out because of patient's decision for unspecified reasons: Secondary | ICD-10-CM | POA: Diagnosis present

## 2024-06-04 DIAGNOSIS — D6489 Other specified anemias: Secondary | ICD-10-CM | POA: Diagnosis present

## 2024-06-04 DIAGNOSIS — I5021 Acute systolic (congestive) heart failure: Secondary | ICD-10-CM | POA: Diagnosis present

## 2024-06-04 DIAGNOSIS — Z556 Problems related to health literacy: Secondary | ICD-10-CM

## 2024-06-04 DIAGNOSIS — G9340 Encephalopathy, unspecified: Secondary | ICD-10-CM | POA: Diagnosis not present

## 2024-06-04 DIAGNOSIS — J188 Other pneumonia, unspecified organism: Secondary | ICD-10-CM | POA: Diagnosis not present

## 2024-06-04 DIAGNOSIS — Z794 Long term (current) use of insulin: Secondary | ICD-10-CM

## 2024-06-04 DIAGNOSIS — Z91199 Patient's noncompliance with other medical treatment and regimen due to unspecified reason: Secondary | ICD-10-CM

## 2024-06-04 DIAGNOSIS — Z79899 Other long term (current) drug therapy: Secondary | ICD-10-CM | POA: Diagnosis not present

## 2024-06-04 DIAGNOSIS — L02612 Cutaneous abscess of left foot: Secondary | ICD-10-CM | POA: Diagnosis present

## 2024-06-04 DIAGNOSIS — L0390 Cellulitis, unspecified: Secondary | ICD-10-CM | POA: Diagnosis not present

## 2024-06-04 DIAGNOSIS — Z7984 Long term (current) use of oral hypoglycemic drugs: Secondary | ICD-10-CM | POA: Diagnosis not present

## 2024-06-04 DIAGNOSIS — J9811 Atelectasis: Secondary | ICD-10-CM | POA: Diagnosis not present

## 2024-06-04 DIAGNOSIS — Z87891 Personal history of nicotine dependence: Secondary | ICD-10-CM

## 2024-06-04 DIAGNOSIS — M7989 Other specified soft tissue disorders: Secondary | ICD-10-CM | POA: Diagnosis not present

## 2024-06-04 DIAGNOSIS — I270 Primary pulmonary hypertension: Secondary | ICD-10-CM | POA: Diagnosis not present

## 2024-06-04 DIAGNOSIS — J189 Pneumonia, unspecified organism: Secondary | ICD-10-CM | POA: Diagnosis not present

## 2024-06-04 DIAGNOSIS — E119 Type 2 diabetes mellitus without complications: Secondary | ICD-10-CM | POA: Diagnosis not present

## 2024-06-04 DIAGNOSIS — E1169 Type 2 diabetes mellitus with other specified complication: Secondary | ICD-10-CM | POA: Diagnosis not present

## 2024-06-04 DIAGNOSIS — J9622 Acute and chronic respiratory failure with hypercapnia: Secondary | ICD-10-CM | POA: Diagnosis not present

## 2024-06-04 DIAGNOSIS — Z5982 Transportation insecurity: Secondary | ICD-10-CM

## 2024-06-04 LAB — CBC WITH DIFFERENTIAL/PLATELET
Abs Immature Granulocytes: 0.08 K/uL — ABNORMAL HIGH (ref 0.00–0.07)
Basophils Absolute: 0.1 K/uL (ref 0.0–0.1)
Basophils Relative: 1 %
Eosinophils Absolute: 0.5 K/uL (ref 0.0–0.5)
Eosinophils Relative: 4 %
HCT: 41.7 % (ref 36.0–46.0)
Hemoglobin: 12.8 g/dL (ref 12.0–15.0)
Immature Granulocytes: 1 %
Lymphocytes Relative: 13 %
Lymphs Abs: 1.6 K/uL (ref 0.7–4.0)
MCH: 26.2 pg (ref 26.0–34.0)
MCHC: 30.7 g/dL (ref 30.0–36.0)
MCV: 85.3 fL (ref 80.0–100.0)
Monocytes Absolute: 0.7 K/uL (ref 0.1–1.0)
Monocytes Relative: 6 %
Neutro Abs: 9.3 K/uL — ABNORMAL HIGH (ref 1.7–7.7)
Neutrophils Relative %: 75 %
Platelets: 240 K/uL (ref 150–400)
RBC: 4.89 MIL/uL (ref 3.87–5.11)
RDW: 13.4 % (ref 11.5–15.5)
WBC: 12.3 K/uL — ABNORMAL HIGH (ref 4.0–10.5)
nRBC: 0 % (ref 0.0–0.2)

## 2024-06-04 LAB — I-STAT VENOUS BLOOD GAS, ED
Acid-Base Excess: 11 mmol/L — ABNORMAL HIGH (ref 0.0–2.0)
Bicarbonate: 39.1 mmol/L — ABNORMAL HIGH (ref 20.0–28.0)
Calcium, Ion: 1.19 mmol/L (ref 1.15–1.40)
HCT: 40 % (ref 36.0–46.0)
Hemoglobin: 13.6 g/dL (ref 12.0–15.0)
O2 Saturation: 84 %
Potassium: 4.8 mmol/L (ref 3.5–5.1)
Sodium: 131 mmol/L — ABNORMAL LOW (ref 135–145)
TCO2: 41 mmol/L — ABNORMAL HIGH (ref 22–32)
pCO2, Ven: 68.6 mmHg — ABNORMAL HIGH (ref 44–60)
pH, Ven: 7.364 (ref 7.25–7.43)
pO2, Ven: 52 mmHg — ABNORMAL HIGH (ref 32–45)

## 2024-06-04 LAB — COMPREHENSIVE METABOLIC PANEL WITH GFR
ALT: 14 U/L (ref 0–44)
AST: 15 U/L (ref 15–41)
Albumin: 3 g/dL — ABNORMAL LOW (ref 3.5–5.0)
Alkaline Phosphatase: 131 U/L — ABNORMAL HIGH (ref 38–126)
Anion gap: 8 (ref 5–15)
BUN: 16 mg/dL (ref 6–20)
CO2: 35 mmol/L — ABNORMAL HIGH (ref 22–32)
Calcium: 9.6 mg/dL (ref 8.9–10.3)
Chloride: 88 mmol/L — ABNORMAL LOW (ref 98–111)
Creatinine, Ser: 0.58 mg/dL (ref 0.44–1.00)
GFR, Estimated: >60 mL/min (ref >60)
Glucose, Bld: 483 mg/dL — ABNORMAL HIGH (ref 70–99)
Potassium: 4.8 mmol/L (ref 3.5–5.1)
Sodium: 131 mmol/L — ABNORMAL LOW (ref 135–145)
Total Bilirubin: 0.3 mg/dL (ref 0.0–1.2)
Total Protein: 8.5 g/dL — ABNORMAL HIGH (ref 6.5–8.1)

## 2024-06-04 LAB — LACTIC ACID, PLASMA: Lactic Acid, Venous: 1.6 mmol/L (ref 0.5–1.9)

## 2024-06-04 LAB — CBG MONITORING, ED
Glucose-Capillary: 446 mg/dL — ABNORMAL HIGH (ref 70–99)
Glucose-Capillary: 289 mg/dL — ABNORMAL HIGH (ref 70–99)

## 2024-06-04 LAB — I-STAT CG4 LACTIC ACID, ED: Lactic Acid, Venous: 1.6 mmol/L (ref 0.5–1.9)

## 2024-06-04 LAB — HCG, SERUM, QUALITATIVE: Preg, Serum: NEGATIVE

## 2024-06-04 MED ORDER — ACETAMINOPHEN 650 MG RE SUPP: 650.0000 mg | Freq: Four times a day (QID) | RECTAL | Status: DC | PRN

## 2024-06-04 MED ORDER — VANCOMYCIN HCL 1.25 G IV SOLR
1250.0000 mg | Freq: Once | INTRAVENOUS | Status: AC
  Administered 2024-06-04: 1250 mg via INTRAVENOUS
  Filled 2024-06-04: qty 25

## 2024-06-04 MED ORDER — LACTATED RINGERS IV BOLUS
1000.0000 mL | Freq: Once | INTRAVENOUS | Status: AC
  Administered 2024-06-04: 1000 mL via INTRAVENOUS

## 2024-06-04 MED ORDER — ACETAMINOPHEN 325 MG PO TABS
650.0000 mg | ORAL_TABLET | Freq: Four times a day (QID) | ORAL | Status: DC | PRN
  Administered 2024-06-05 – 2024-06-07 (×3): 650 mg via ORAL
  Filled 2024-06-04 (×3): qty 2

## 2024-06-04 MED ORDER — SODIUM CHLORIDE 0.9 % IV SOLN: INTRAVENOUS | Status: DC

## 2024-06-04 MED ORDER — IOHEXOL 350 MG/ML SOLN
75.0000 mL | Freq: Once | INTRAVENOUS | Status: AC | PRN
  Administered 2024-06-04: 75 mL via INTRAVENOUS

## 2024-06-04 MED ORDER — OXYCODONE-ACETAMINOPHEN 5-325 MG PO TABS
1.0000 | ORAL_TABLET | Freq: Once | ORAL | Status: AC
  Administered 2024-06-04: 1 via ORAL
  Filled 2024-06-04: qty 1

## 2024-06-04 MED ORDER — HYDROCODONE-ACETAMINOPHEN 5-325 MG PO TABS
1.0000 | ORAL_TABLET | ORAL | Status: DC | PRN
  Administered 2024-06-04: 1 via ORAL
  Administered 2024-06-05 – 2024-06-23 (×30): 2 via ORAL
  Filled 2024-06-04 (×16): qty 2
  Filled 2024-06-04: qty 1
  Filled 2024-06-04 (×6): qty 2
  Filled 2024-06-04: qty 1
  Filled 2024-06-04 (×9): qty 2

## 2024-06-04 MED ORDER — PIPERACILLIN-TAZOBACTAM 4.5 G IVPB: 4.5000 g | Freq: Once | INTRAVENOUS | Status: DC

## 2024-06-04 MED ORDER — ONDANSETRON HCL 4 MG PO TABS
4.0000 mg | ORAL_TABLET | Freq: Four times a day (QID) | ORAL | Status: DC | PRN
  Administered 2024-06-08: 4 mg via ORAL
  Filled 2024-06-04: qty 1

## 2024-06-04 MED ORDER — VANCOMYCIN HCL 10 G IV SOLR
1750.0000 mg | INTRAVENOUS | Status: DC
  Administered 2024-06-05 – 2024-06-12 (×8): 1750 mg via INTRAVENOUS
  Filled 2024-06-04: qty 17.5
  Filled 2024-06-04 (×4): qty 1750
  Filled 2024-06-04: qty 17.5
  Filled 2024-06-04 (×3): qty 1750
  Filled 2024-06-04: qty 17.5

## 2024-06-04 MED ORDER — VANCOMYCIN HCL 10 G IV SOLR: 2000.0000 mg | Freq: Once | INTRAVENOUS | Status: DC

## 2024-06-04 MED ORDER — SODIUM CHLORIDE 0.9 % IV SOLN
3.0000 g | Freq: Four times a day (QID) | INTRAVENOUS | Status: DC
  Administered 2024-06-04 – 2024-06-14 (×37): 3 g via INTRAVENOUS
  Filled 2024-06-04 (×37): qty 8

## 2024-06-04 MED ORDER — ONDANSETRON HCL 4 MG/2ML IJ SOLN
4.0000 mg | Freq: Four times a day (QID) | INTRAMUSCULAR | Status: DC | PRN
  Administered 2024-06-05 – 2024-06-18 (×5): 4 mg via INTRAVENOUS
  Filled 2024-06-04 (×5): qty 2

## 2024-06-04 MED ORDER — FENTANYL CITRATE (PF) 50 MCG/ML IJ SOSY
12.5000 ug | PREFILLED_SYRINGE | INTRAMUSCULAR | Status: DC | PRN
  Administered 2024-06-05: 50 ug via INTRAVENOUS
  Filled 2024-06-04: qty 1

## 2024-06-04 MED ORDER — INSULIN ASPART 100 UNIT/ML IJ SOLN
0.0000 [IU] | INTRAMUSCULAR | Status: DC
  Administered 2024-06-04: 9 [IU] via SUBCUTANEOUS
  Administered 2024-06-04: 5 [IU] via SUBCUTANEOUS
  Administered 2024-06-05: 3 [IU] via SUBCUTANEOUS
  Administered 2024-06-05: 7 [IU] via SUBCUTANEOUS
  Filled 2024-06-04: qty 5
  Filled 2024-06-04: qty 7
  Filled 2024-06-04: qty 9

## 2024-06-04 MED ORDER — OXYCODONE HCL 5 MG PO TABS
10.0000 mg | ORAL_TABLET | Freq: Four times a day (QID) | ORAL | Status: DC | PRN
  Administered 2024-06-04 – 2024-06-05 (×3): 10 mg via ORAL
  Filled 2024-06-04 (×3): qty 2

## 2024-06-04 MED ORDER — PIPERACILLIN-TAZOBACTAM 3.375 G IVPB 30 MIN
3.3750 g | Freq: Once | INTRAVENOUS | Status: AC
  Administered 2024-06-04: 3.375 g via INTRAVENOUS
  Filled 2024-06-04: qty 50

## 2024-06-04 NOTE — Assessment & Plan Note (Signed)
 Order sliding scale hold p.o. medications for tonight it is unclear what was patient's insulin  regimen Will need to titrate while hospitalized

## 2024-06-04 NOTE — ED Triage Notes (Signed)
 Pt having left foot pain and inflammation, has been seen for it once before but says it still hasn't gone away

## 2024-06-04 NOTE — Assessment & Plan Note (Signed)
 Obtain urine toxicity screen

## 2024-06-04 NOTE — Subjective & Objective (Signed)
 Left foot pain, swelling increased drainage Patient has known history of diabetes he is supposed to be on insulin  but does not take she only takes metformin .  She has Undergone fifth ray amputation by Dr. Harden on 05 May 2024 patient was supposed to follow-up in clinic but did not patient was supposed to be on doxycycline  but only took antibiotics for 4 days. She denies any nausea or vomiting she did have an episode of feeling hot but no documented fevers.  Reports normal appetite. She quit smoking about a month ago Denies alcohol abuse lives with her family

## 2024-06-04 NOTE — Assessment & Plan Note (Signed)
 Continue on Unasyn  and vancomycin  Sed rate CRP ordered Imaging worrisome for osteomyelitis May need surgical intervention Dr. Harden is aware N.p.o. postmidnight ABI ordered Pain control and blood sugar management

## 2024-06-04 NOTE — ED Provider Notes (Incomplete)
 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  She was unable to complete her antibiotic prescription that she was discharged home with.  She has been wearing the boot but has also been walking on the foot even though she does report she is trying to elevate it as often as possible.  She did recently miss her follow-up appointment as well.  Now her foot is having more drainage and swelling.  On occasion she will have chills but is not having continuous fever or chills.  On exam patient's foot appears infected, swollen and x-ray shows worsening signs of osteomyelitis.

## 2024-06-04 NOTE — Assessment & Plan Note (Signed)
 Unasyn  and vancomycin  check MRSA appreciate Dr. Harden consult May need surgical intervention

## 2024-06-04 NOTE — Assessment & Plan Note (Signed)
 Patient with history of sleep apnea not compliant with CPAP VBG was obtained in the emergency department and showed evidence of hypercapnia Patient with elevated bicarb suggestive of chronic hypercapnia which appears to be compensated Will likely benefit from overnight BiPAP

## 2024-06-04 NOTE — H&P (Signed)
 Bianca Yang FMW:[id number] DOB: Feb 28, 1976 DOA: 06/04/2024     PCP: Patient, [name] Per   Outpatient Specialists: Orthopedics Dr. Harden  Patient arrived to ER on 06/04/24 at 1241 Referred by Attending Silvester Ales, MD   Patient coming from:    home Lives   With family     Chief Complaint:   Chief Complaint  Patient presents with   Foot Pain    HPI: Bianca Yang is a 48 y.o. female with medical history significant of DM2, diabetic foot ulcer, OSA, PAD with tubo-ovarian abscess    Presented with left foot pain and swelling Left foot pain, swelling increased drainage Patient has known history of diabetes he is supposed to be on insulin  but does not take she only takes metformin .  She has Undergone fifth ray amputation by Dr. Harden on 05 May 2024 patient was supposed to follow-up in clinic but did not patient was supposed to be on doxycycline  but only took antibiotics for 4 days. She denies any nausea or vomiting she did have an episode of feeling hot but no documented fevers.  Reports normal appetite. She quit smoking about a month ago Denies alcohol abuse lives with her family Patient was supposed to have sutures removal but did not  Denies significant ETOH intake   Does not smoke     Regarding pertinent Chronic problems:        DM 2 -  Lab Results  Component Value Date   HGBA1C 11.6 (H) 05/04/2024   on PO meds only,      OSA - noncompliant with CPAP      Chronic anemia - baseline hg Hemoglobin & Hematocrit  Recent Labs    05/07/24 0447 06/04/24 1348 06/04/24 1648  HGB 10.1* 12.8 13.6   Iron/TIBC/Ferritin/ %Sat    Component Value Date/Time   IRON 9 (L) 01/15/2016 1440   TIBC 227 (L) 01/15/2016 1440   FERRITIN 322 (H) 01/15/2016 1440   IRONPCTSAT 4 (L) 01/15/2016 1440      While in ER: Clinical Course as of 06/04/24 1902  Sun Jun 04, 2024  1612 Seen by orthopedic surgery already. [BS]    Clinical Course User Index [BS]  Arlee Katz, MD       Lab Orders         Blood culture (routine x 2)         Comprehensive metabolic panel         CBC with Differential         hCG, serum, qualitative         I-Stat Lactic Acid, ED         I-Stat venous blood gas, ([facility], [facility], [facility])      Large lateral midfoot ulceration with lytic destruction involving the cuboid and proximal base of the fourth metatarsal, suspicious for osteomyelitis.  Following Medications were ordered in ER: Medications  Vancomycin  (VANCOCIN ) 1,250 mg in sodium chloride  0.9 % 250 mL IVPB (1,250 mg Intravenous New Bag/Given 06/04/24 1754)  oxyCODONE  (Oxy IR/ROXICODONE ) immediate release tablet 10 mg (10 mg Oral Given 06/04/24 1755)  oxyCODONE -acetaminophen  (PERCOCET/ROXICET) 5-325 MG per tablet 1 tablet (1 tablet Oral Given 06/04/24 1359)  lactated ringers  bolus 1,000 mL (0 mLs Intravenous Stopped 06/04/24 1720)  piperacillin -tazobactam (ZOSYN ) IVPB 3.375 g (0 g Intravenous Stopped 06/04/24 1720)  iohexol  (OMNIPAQUE ) 350 MG/ML injection 75 mL (75 mLs Intravenous Contrast Given 06/04/24 1733)    _______________________________________________________ ER Provider Called:     Orthopedics  Dr. Harden They Recommend admit to medicine   Will see in AM       ED Triage Vitals  Encounter Vitals Group     BP 06/04/24 1244 (!) 102/53     Girls Systolic BP Percentile --      Girls Diastolic BP Percentile --      Boys Systolic BP Percentile --      Boys Diastolic BP Percentile --      Pulse Rate 06/04/24 1244 87     Resp 06/04/24 1244 14     Temp 06/04/24 1244 98.8 F (37.1 C)     Temp src --      SpO2 06/04/24 1244 95 %     Weight 06/04/24 1252 147 lb 11.3 oz (67 kg)     Height 06/04/24 1252 5' 7 (1.702 m)     Head Circumference --      Peak Flow --      Pain Score 06/04/24 1251 10     Pain Loc --      Pain Education --      Exclude from Growth Chart --   UFJK(75)@     _________________________________________ Significant initial   Findings: Abnormal Labs Reviewed  COMPREHENSIVE METABOLIC PANEL WITH GFR - Abnormal; Notable for the following components:      Result Value   Sodium 131 (*)    Chloride 88 (*)    CO2 35 (*)    Glucose, Bld 483 (*)    Total Protein 8.5 (*)    Albumin 3.0 (*)    Alkaline Phosphatase 131 (*)    All other components within normal limits  CBC WITH DIFFERENTIAL/PLATELET - Abnormal; Notable for the following components:   WBC 12.3 (*)    Neutro Abs 9.3 (*)    Abs Immature Granulocytes 0.08 (*)    All other components within normal limits  I-STAT VENOUS BLOOD GAS, ED - Abnormal; Notable for the following components:   pCO2, Ven 68.6 (*)    pO2, Ven 52 (*)    Bicarbonate 39.1 (*)    TCO2 41 (*)    Acid-Base Excess 11.0 (*)    Sodium 131 (*)    All other components within normal limits    ECG: Ordered    The recent clinical data is shown below. Vitals:   06/04/24 1628 06/04/24 1653 06/04/24 1703 06/04/24 1845  BP:   123/72   Pulse: 90 93 86 91  Resp: 15 12 17 14   Temp:      SpO2: 95% 90% 96% 92%  Weight:      Height:        WBC     Component Value Date/Time   WBC 12.3 (H) 06/04/2024 1348   LYMPHSABS 1.6 06/04/2024 1348   MONOABS 0.7 06/04/2024 1348   EOSABS 0.5 06/04/2024 1348   BASOSABS 0.1 06/04/2024 1348     Lactic Acid, Venous    Component Value Date/Time   LATICACIDVEN 1.6 06/04/2024 1409    Procalcitonin   Ordered      Results for orders placed or performed during the hospital encounter of 05/03/24  Blood Cultures x 2 sites     Status: None   Collection Time: 05/03/24  3:59 PM   Specimen: BLOOD  Result Value Ref Range Status   Specimen Description   Final    BLOOD LEFT ANTECUBITAL Performed at [facility], [address] [place], [place] [address]    Special Requests   Final  BOTTLES DRAWN AEROBIC AND ANAEROBIC Blood Culture adequate volume Performed at [facility], [address] [place], [place]  [address]    Culture   Final    NO GROWTH 5 DAYS Performed at [facility], [address], [place], [place] [address]    Report Status 05/08/2024 FINAL  Final  Blood Cultures x 2 sites     Status: None   Collection Time: 05/03/24  4:31 PM   Specimen: BLOOD  Result Value Ref Range Status   Specimen Description   Final    BLOOD RIGHT ANTECUBITAL Performed at [facility], [address], [place], [place] [address]    Special Requests   Final    BOTTLES DRAWN AEROBIC AND ANAEROBIC Blood Culture results may not be optimal due to an inadequate volume of blood received in culture bottles Performed at [facility], [address] [place], [place] [address]    Culture   Final    NO GROWTH 5 DAYS Performed at [facility], [address] [place], [place] [address]    Report Status 05/08/2024 FINAL  Final  Surgical pcr screen     Status: Abnormal   Collection Time: 05/04/24  4:15 AM   Specimen: Nasal Mucosa; Nasal Swab  Result Value Ref Range Status   MRSA, PCR NEGATIVE NEGATIVE Final   Staphylococcus aureus POSITIVE (A) NEGATIVE Final    Comment: (NOTE) The Xpert SA Assay (FDA approved for NASAL specimens in patients 37 years of age and older), is one component of a comprehensive surveillance program. It is not intended to diagnose infection nor to guide or monitor treatment. Performed at [facility], [address], [place], [place] [address]   Aerobic/Anaerobic Culture w Gram Stain (surgical/deep wound)     Status: None   Collection Time: 05/05/24  1:09 PM   Specimen: Abscess  Result Value Ref Range Status   Specimen Description TISSUE  Final   Special Requests ABSCESS from left foot  Final   Gram Stain   Final    FEW WBC PRESENT, PREDOMINANTLY PMN FEW GRAM POSITIVE COCCI RARE GRAM NEGATIVE RODS Performed at [facility], [address], [place], [place] [address]    Culture   Final    RARE METHICILLIN RESISTANT  STAPHYLOCOCCUS AUREUS FEW STREPTOCOCCUS CONSTELLATUS MIXED ANAEROBIC FLORA PRESENT.  CALL LAB IF FURTHER IID REQUIRED.    Report Status 05/10/2024 FINAL  Final   Organism ID, Bacteria METHICILLIN RESISTANT STAPHYLOCOCCUS AUREUS  Final   Organism ID, Bacteria STREPTOCOCCUS CONSTELLATUS  Final      Susceptibility   Streptococcus constellatus - MIC*    PENICILLIN  <=0.06 SENSITIVE Sensitive     CEFTRIAXONE  <=0.12 SENSITIVE Sensitive     ERYTHROMYCIN <=0.12 SENSITIVE Sensitive     LEVOFLOXACIN <=0.25 SENSITIVE Sensitive     VANCOMYCIN  0.25 SENSITIVE Sensitive     * FEW STREPTOCOCCUS CONSTELLATUS   Methicillin resistant staphylococcus aureus - MIC*    CIPROFLOXACIN >=8 RESISTANT Resistant     ERYTHROMYCIN >=8 RESISTANT Resistant     GENTAMICIN <=0.5 SENSITIVE Sensitive     OXACILLIN >=4 RESISTANT Resistant     TETRACYCLINE <=1 SENSITIVE Sensitive     VANCOMYCIN  1 SENSITIVE Sensitive     TRIMETH/SULFA >=320 RESISTANT Resistant     CLINDAMYCIN  <=0.25 SENSITIVE Sensitive     RIFAMPIN <=0.5 SENSITIVE Sensitive     Inducible Clindamycin  NEGATIVE Sensitive     LINEZOLID 2 SENSITIVE Sensitive     *  RARE METHICILLIN RESISTANT STAPHYLOCOCCUS AUREUS    ABX started Antibiotics Given (last 72 hours)     Date/Time Action Medication Dose Rate   06/04/24 1644 New Bag/Given   piperacillin -tazobactam (ZOSYN ) IVPB 3.375 g 3.375 g 100 mL/hr   06/04/24 1754 New Bag/Given   Vancomycin  (VANCOCIN ) 1,250 mg in sodium chloride  0.9 % 250 mL IVPB 1,250 mg 166.7 mL/hr        Susceptibility data from last 90 days. Collected Specimen Info Organism CEFTRIAXONE  Ciprofloxacin Clindamycin  Erythromycin Gentamicin Susc lslt Inducible Clindamycin  LEVOFLOXACIN Linezolid Oxacillin PENICILLIN  Rifampin TELAVANCIN TETRACYCLINE  05/05/24 Abscess from Path Tissue Methicillin resistant staphylococcus aureus   R  S  R  S  S   S  R   S  S  S    Streptococcus constellatus  S    S    S    S   S    Collected Specimen Info  Organism Trimethoprim/Sulfa  05/05/24 Abscess from Path Tissue Methicillin resistant staphylococcus aureus  R    Streptococcus constellatus      __________________________________________________ Venous  Blood Gas result:  pH  7.364 Sodium 131 Low  mmol/L   pCO2, Ven 68.6 High  mmHg Potassium 4.8 mmol/L  pO2, Ven 52 High  mmHg       __________________________________________________________ Recent Labs  Lab 06/04/24 1348 06/04/24 1648  NA 131* 131*  K 4.8 4.8  CO2 35*  --   GLUCOSE 483*  --   BUN 16  --   CREATININE 0.58  --   CALCIUM  9.6  --     Cr    stable,  Lab Results  Component Value Date   CREATININE 0.58 06/04/2024   CREATININE 0.46 05/09/2024   CREATININE 0.44 05/08/2024    Recent Labs  Lab 06/04/24 1348  AST 15  ALT 14  ALKPHOS 131*  BILITOT 0.3  PROT 8.5*  ALBUMIN 3.0*   Lab Results  Component Value Date   CALCIUM  9.6 06/04/2024    Plt: Lab Results  Component Value Date   PLT 240 06/04/2024    Recent Labs  Lab 06/04/24 1348 06/04/24 1648  WBC 12.3*  --   NEUTROABS 9.3*  --   HGB 12.8 13.6  HCT 41.7 40.0  MCV 85.3  --   PLT 240  --     HG/HCT stable,      Component Value Date/Time   HGB 13.6 06/04/2024 1648   HCT 40.0 06/04/2024 1648   HCT 36.3 01/15/2016 1440   MCV 85.3 06/04/2024 1348        _______________________________________________ Hospitalist was called for admission for   Acute osteomyelitis of metatarsal bone of left foot   Cellulitis and abscess of toe of left foot   The following Work up has been ordered so far:  Orders Placed This Encounter  Procedures   Blood culture (routine x 2)   DG Foot Complete Left   CT FOOT LEFT W CONTRAST   Comprehensive metabolic panel   CBC with Differential   hCG, serum, qualitative   Initiate Carrier Fluid Protocol   Cardiac Monitoring - Continuous Indefinite   Consult to orthopedic surgery   Consult to orthopedic surgery  Dr. Harden was pt's physician, w/ ortho care.    Consult for Unassigned Medical Admission   Consult to hospitalist   Consult to hospitalist   I-Stat Lactic Acid, ED   I-Stat venous blood gas, ([facility], [facility], [facility])   Admit to Inpatient (patient's expected length of stay  will be greater than 2 midnights or inpatient only procedure)     OTHER Significant initial  Findings:  labs showing:     DM  labs:  HbA1C: Recent Labs    05/04/24 0321  HGBA1C 11.6*       CBG (last 3)  No results for input(s): GLUCAP in the last 72 hours.        Cultures:    Component Value Date/Time   SDES TISSUE 05/05/2024 1309   SPECREQUEST ABSCESS from left foot 05/05/2024 1309   CULT  05/05/2024 1309    RARE METHICILLIN RESISTANT STAPHYLOCOCCUS AUREUS FEW STREPTOCOCCUS CONSTELLATUS MIXED ANAEROBIC FLORA PRESENT.  CALL LAB IF FURTHER IID REQUIRED.    REPTSTATUS 05/10/2024 FINAL 05/05/2024 1309     Radiological Exams on Admission: CT FOOT LEFT W CONTRAST Result Date: 06/04/2024 EXAM: CT OF THE LEFT FOOT, WITH IV CONTRAST 06/04/2024 05:32:39 PM TECHNIQUE: Axial images were acquired through the left foot with IV contrast. Reformatted images were reviewed. Automated exposure control, iterative reconstruction, and/or weight based adjustment of the mA/kV was utilized to reduce the radiation dose to as low as reasonably achievable. COMPARISON: None available. CLINICAL HISTORY: Pain and inflammation. Prior 5th ray amputation on 05/05/2024. Radiography of 06/04/2024 showed gas in the lateral soft tissues of the foot with lytic destruction of the cuboid and fourth metatarsal. FINDINGS: BONES: Prior 5th ray amputation. Bony demineralization and bony destructive findings in the base of the 4th metatarsal and the cuboid compatible with acute osteomyelitis. Plantar and achilles calcaneal spurs. JOINTS: Gas density tracks into the margin of the articular space between the 4th metatarsal base and the cuboid. No dislocation. The joint spaces are normal, except for the  involvement of the 4th metatarsal-cuboid articulation by osteomyelitis. SOFT TISSUES: Abnormal spicle gas density tracks in the subcutaneous tissues of the left foot. Diffuse subcutaneous edema dorsally and laterally along the foot. This likely reflects cellulitis. Ulceration along the lateral foot especially at the level of the lumbosacral joint. Anterior convexity of the achilles tendon contour compatible with mild distal achilles tendinopathy. IMPRESSION: 1. Acute osteomyelitis involving the base of the fourth metatarsal and the cuboid, with associated gas in the subcutaneous tissues and along the articular margin. 2. Diffuse dorsal and lateral subcutaneous edema of the foot, likely cellulitis. 3. Lateral foot ulceration. 4. Mild distal Achilles tendinopathy with anterior convexity of the Achilles tendon contour. 5. Plantar and Achilles calcaneal spurs. Electronically signed by: Ryan Salvage MD 06/04/2024 05:42 PM EST RP Workstation: [id number]   DG Foot Complete Left Result Date: 06/04/2024 EXAM: 3 VIEW(S) XRAY OF THE LEFT FOOT 06/04/2024 02:39:00 PM COMPARISON: 05/03/2024 status post surgical amputation of the fifth metatarsal and phalanges. CLINICAL HISTORY: diabetic foot wound FINDINGS: BONES AND JOINTS: Status post surgical amputation of the fifth metatarsal and phalanges. Probable lytic destruction involving the cuboid bone and proximal base of the fourth metatarsal concerning for osteomyelitis. SOFT TISSUES: Large ulceration is seen laterally in the midfoot. IMPRESSION: 1. Large lateral midfoot ulceration with lytic destruction involving the cuboid and proximal base of the fourth metatarsal, suspicious for osteomyelitis. Electronically signed by: Lynwood Seip MD 06/04/2024 02:57 PM EST RP Workstation: [id number]   _______________________________________________________________________________________________________ Latest  Blood pressure 123/72, pulse 91, temperature 98.8 F (37.1 C), resp.  rate 14, height 5' 7 (1.702 m), weight 67 kg, SpO2 92%.   Vitals  labs and radiology finding personally reviewed  Review of Systems:    Pertinent positives include:  , chills, fatigue,  Constitutional:  No weight loss,  night sweats, Fevers weight loss  HEENT:  No headaches, Difficulty swallowing,Tooth/dental problems,Sore throat,  No sneezing, itching, ear ache, nasal congestion, post nasal drip,  Cardio-vascular:  No chest pain, Orthopnea, PND, anasarca, dizziness, palpitations.no Bilateral lower extremity swelling  GI:  No heartburn, indigestion, abdominal pain, nausea, vomiting, diarrhea, change in bowel habits, loss of appetite, melena, blood in stool, hematemesis Resp:  no shortness of breath at rest. No dyspnea on exertion, No excess mucus, no productive cough, No non-productive cough, No coughing up of blood.No change in color of mucus.No wheezing. Skin:  no rash or lesions. No jaundice GU:  no dysuria, change in color of urine, no urgency or frequency. No straining to urinate.  No flank pain.  Musculoskeletal:  No joint pain or no joint swelling. No decreased range of motion. No back pain.  Psych:  No change in mood or affect. No depression or anxiety. No memory loss.  Neuro: no localizing neurological complaints, no tingling, no weakness, no double vision, no gait abnormality, no slurred speech, no confusion  All systems reviewed and apart from HOPI all are negative _______________________________________________________________________________________________ Past Medical History:   Past Medical History:  Diagnosis Date   Diabetes mellitus without complication (HCC)    Substance abuse (HCC)     Past Surgical History:  Procedure Laterality Date   AMPUTATION Left 05/05/2024   Procedure: AMPUTATION, FOOT, RAY;  Surgeon: Harden Jerona GAILS, MD;  Location: [facility] OR;  Service: Orthopedics;  Laterality: Left;  LEFT FOOT 5TH RAY AMPUTATION   APPLICATION OF WOUND VAC  05/05/2024    Procedure: APPLICATION, WOUND VAC;  Surgeon: Harden Jerona GAILS, MD;  Location: [facility] OR;  Service: Orthopedics;;   TUBAL LIGATION      Social History:  Ambulatory  walker      reports that she has quit smoking. Her smoking use included cigarettes. She has never used smokeless tobacco. She reports that she does not currently use alcohol. She reports that she does not use drugs.   Family History:   Family History  Problem Relation Age of Onset   Diabetes Mother    ______________________________________________________________________________________________ Allergies: Allergies  Allergen Reactions   Sulfa Antibiotics Other (See Comments)    Unknown/ childhood allergy.      Prior to Admission medications   Medication Sig Start Date End Date Taking? Authorizing Provider  Accu-Chek Softclix Lancets lancets 1 each as directed. Dispense based on patient and insurance preference. Use up to four times daily as directed. (FOR ICD-10 E10.9, E11.9). 05/09/24  Yes Samtani, Jai-Gurmukh, MD  acetaminophen  (TYLENOL ) 500 MG tablet Take 2 tablets (1,000 mg total) by mouth every 6 (six) hours. 05/09/24  Yes Samtani, Jai-Gurmukh, MD  Blood Glucose Monitoring Suppl (BLOOD GLUCOSE MONITOR SYSTEM) w/Device KIT 1 each by Does not apply route as directed. Dispense based on patient and insurance preference. Use up to four times daily as directed. (FOR ICD-10 E10.9, E11.9). 05/09/24  Yes Samtani, Jai-Gurmukh, MD  glimepiride  (AMARYL ) 2 MG tablet Take 1 tablet (2 mg total) by mouth daily with breakfast. 05/10/24  Yes Samtani, Jai-Gurmukh, MD  Glucose Blood (BLOOD GLUCOSE TEST STRIPS) STRP 1 each by Does not apply route as directed. Dispense based on patient and insurance preference. Use up to four times daily as directed. (FOR ICD-10 E10.9, E11.9). 05/09/24  Yes Samtani, Jai-Gurmukh, MD  Lancet Device MISC 1 each by Does not apply route as directed. Dispense based on patient and insurance preference. Use up to four  times daily as directed. (FOR ICD-10  E10.9, E11.9). 05/09/24  Yes Samtani, Jai-Gurmukh, MD  metFORMIN  (GLUCOPHAGE ) 1000 MG tablet Take 1 tablet (1,000 mg total) by mouth 2 (two) times daily with a meal. 05/09/24  Yes Samtani, Jai-Gurmukh, MD  naproxen  (NAPROSYN ) 500 MG tablet Take 1 tablet (500 mg total) by mouth 2 (two) times daily with a meal. 05/09/24  Yes Samtani, Jai-Gurmukh, MD  amoxicillin -clavulanate (AUGMENTIN ) 875-125 MG tablet Take 1 tablet by mouth 2 (two) times daily. Patient not taking: Reported on 06/04/2024 05/10/24   Harden Jerona GAILS, MD  oxyCODONE  (OXY IR/ROXICODONE ) 5 MG immediate release tablet Take 1-2 tablets (5-10 mg total) by mouth every 4 (four) hours as needed for severe pain (pain score 7-10). Patient not taking: Reported on 06/04/2024 05/09/24   Samtani, Jai-Gurmukh, MD  polyethylene glycol powder (GLYCOLAX /MIRALAX ) 17 GM/SCOOP powder Take 17 g by mouth daily as needed for mild constipation. Dissolve 1 capful (17g) in 4-8 ounces of liquid and take by mouth daily. Patient not taking: Reported on 06/04/2024 05/09/24   Royal Sill, MD    ___________________________________________________________________________________________________ Physical Exam:    06/04/2024    6:45 PM 06/04/2024    5:03 PM 06/04/2024    4:53 PM  Vitals with BMI  Systolic  123   Diastolic  72   Pulse 91 86 93     1. General:  in No  Acute distress   Chronically ill  -appearing 2. Psychological: Alert and   Oriented 3. Head/ENT:    Dry Mucous Membranes                          Head Non traumatic, neck supple                       Poor Dentition 4. SKIN:  decreased Skin turgor,  Skin clean Dry and intact no rash    5. Heart: Regular rate and rhythm no  Murmur, no Rub or gallop 6. Lungs:  no wheezes or crackles   7. Abdomen: Soft,  non-tender, Non distended  bowel sounds present 8. Lower extremities: no clubbing, cyanosis, no  edema 9. Neurologically Grossly intact, moving all 4  extremities equally   10. MSK: Normal range of motion    Chart has been reviewed  ______________________________________________________________________________________________  Assessment/Plan  48 y.o. female with medical history significant of DM2, diabetic foot ulcer, OSA, PAD with tubo-ovarian abscess  Admitted for   Acute osteomyelitis of metatarsal bone of left foot   Present on Admission:  Osteomyelitis of left foot (HCC)  Diabetic foot infection (HCC)  Cocaine abuse (HCC)  Hypercapnia     Diabetic foot infection (HCC) Continue on Unasyn  and vancomycin  Sed rate CRP ordered Imaging worrisome for osteomyelitis May need surgical intervention Dr. Harden is aware N.p.o. postmidnight ABI ordered Pain control and blood sugar management  Osteomyelitis of left foot (HCC) Unasyn  and vancomycin  check MRSA appreciate Dr. Harden consult May need surgical intervention  DM (diabetes mellitus), type 2 with complications (HCC) Order sliding scale hold p.o. medications for tonight it is unclear what was patient's insulin  regimen Will need to titrate while hospitalized  Cocaine abuse (HCC) Obtain urine toxicity screen  Hypercapnia Patient with history of sleep apnea not compliant with CPAP VBG was obtained in the emergency department and showed evidence of hypercapnia Patient with elevated bicarb suggestive of chronic hypercapnia which appears to be compensated Will likely benefit from overnight BiPAP    Other plan as per orders.  DVT  prophylaxis:  SCD     Code Status:    Code Status: Prior FULL CODE  as per patient   I had personally discussed CODE STATUS with patient    ACP   none    Family Communication:   Family   at  Bedside  plan of care was discussed   with father,   Diet diabetic diet n.p.o. postmidnight   Disposition Plan:     To home once workup is complete and patient is stable   Following barriers for discharge:                                                        Pain controlled with PO medications                                white count improving able to transition to PO antibiotics                            need consultants to evaluate patient prior to discharge                           Consult Orders  (From admission, onward)           Start     Ordered   06/04/24 1816  Consult to hospitalist  Pg sent by deloris 18:36  Once       Provider:  (Not yet assigned)  Question Answer Comment  Place call to: [facility]   Reason for Consult Admit      06/04/24 1815   06/04/24 1740  Consult to hospitalist  [facility] BY DELORIS  Once       Provider:  (Not yet assigned)  Question Answer Comment  Place call to: [facility]   Reason for Consult Admit      06/04/24 1739                              Diabetes care coordinator                   Transition of care consulted                   Nutrition    consulted                              Consults called: orthopedics    Admission status:  ED Disposition     ED Disposition  Admit   Condition  --   Comment  Hospital Area: [facility] [[id number]]  Level of Care: Progressive [102]  Admit to Progressive based on following criteria: MULTISYSTEM THREATS such as stable sepsis, metabolic/electrolyte imbalance with or without encephalopathy that is responding to early treatment.  May admit patient to [facility] or [facility] if equivalent level of care is available:: No  Diagnosis: Osteomyelitis of left foot [facility]) [[id number]]  Admitting Physician: Zyrell Carmean [[id number]]  Attending Physician: Quantarius Genrich [[id number]]  Certification:: I certify this patient will need inpatient services for at least 2 midnights  Expected Medical Readiness: 06/07/2024           inpatient     I Expect 2 midnight stay secondary to severity of patient's current illness need for inpatient interventions justified by the following: hemodynamic instability despite  optimal treatment (tachycardia *hypotension * tachypnea *hypoxia, hypercapnia)  Severe lab/radiological/exam abnormalities including:    Acute osteomyelitis of metatarsal bone of left foot (HCC)     and extensive comorbidities including:  substance abuse    DM2    That are currently affecting medical management.   I expect  patient to be hospitalized for 2 midnights requiring inpatient medical care.  Patient is at high risk for adverse outcome (such as loss of life or disability) if not treated.  Indication for inpatient stay as follows:   Need for operative/procedural  intervention   Need for IV antibiotics, IV fluids,, IV pain medications, I    Level of care    progressive        Blease Quiver 06/04/2024, 7:39 PM    [facility]     after 2 AM please page floor coverage   If 7AM-7PM, please contact the day team taking care of the patient using [email or web address]

## 2024-06-04 NOTE — ED Triage Notes (Signed)
 States s\he doesn't know when her follow up with DR. Harden is she is in the process of moving

## 2024-06-04 NOTE — Progress Notes (Signed)
 Pharmacy Antibiotic Note  Bianca Yang is a 48 y.o. female for which pharmacy has been consulted for ampicillin -sulbactam and vancomycin  dosing for wound infection.  Patient with a history of T2DM, diabetic foot ulcer, OSA, PAD. Patient presenting with left foot pain.  SCr 0.58 WBC 12.3; LA 1.6; T 98.8; HR 91; RR 14  Zosyn  given x 1 in ED  Plan: Unasyn  3g q6h Vancomycin  1250 mg once given in the ED --Will start 1750 mg q24hr (eAUC 496.5) unless change in renal function Monitor WBC, fever, renal function, cultures De-escalate when able Levels at steady state  Height: 5' 7 (170.2 cm) Weight: 67 kg (147 lb 11.3 oz) IBW/kg (Calculated) : 61.6  Temp (24hrs), Avg:98.8 F (37.1 C), Min:98.8 F (37.1 C), Max:98.8 F (37.1 C)  Recent Labs  Lab 06/04/24 1348 06/04/24 1409  WBC 12.3*  --   CREATININE 0.58  --   LATICACIDVEN  --  1.6    Estimated Creatinine Clearance: 82.7 mL/min (by C-G formula based on SCr of 0.58 mg/dL).    Allergies  Allergen Reactions   Sulfa Antibiotics Other (See Comments)    Unknown/ childhood allergy.    Microbiology results: Pending  Thank you for allowing pharmacy to be a part of this patient's care.  Dorn Buttner, PharmD, BCPS 06/04/2024 7:31 PM ED Clinical Pharmacist -  [phone]

## 2024-06-04 NOTE — ED Provider Notes (Signed)
 [facility] EMERGENC[facility] DEPARTMENT AT [facility] Provider Note   CSN: [id number] Arrival date & time: 06/04/24  1241     Patient presents with: Foot Pain   Bianca Yang is a 48 y.o. female. 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  History per patient.  Endorses over the past 2 weeks, she has been having significant swelling to the left foot, that has progressively worsened.  She endorses that she was sent home with doxycycline , however did not take any of the antibiotics.  She also did not follow-up with Dr. Due to have the stitches removed.  Stitches are still in place on presentation today.  She states that she did not think the swelling was that bad, however her husband is very concerned about the swelling, and was able to convince her to come to the ED today.  She told me that initially the swelling was only present for 2 days, however her husband was more concerned and that the swelling has been present for 2 weeks, there is also starting to become drainage from the left lateral foot, and is concerned he may have a more.  Patient Dors that she has been taking medication for her diabetes, but did not take the antibiotics.  States that she did not think that it would provide any support.  Denies chest pain or shortness of breath.  Denies extension of the swelling or erythema up the leg.  On chart review, patient was recently admitted 05/03/2024 through 05/09/2024 with left foot pain and erythema, large ulcer to the base of the fifth metatarsal.  Known osteomyelitis to the fifth proximal digit in 2021.  Mild DKA at that time as well.  L 5th metatarsal was amputated. Started on doxycycline  per ID, MRSA growing.  A1c 11.6 at that time.  {Add pertinent medical, surgical, social history, OB history to HPI:32947}  Foot Pain       Prior to Admission medications   Medication Sig Start Date  End Date Taking? Authorizing Provider  Accu-Chek Softclix Lancets lancets 1 each as directed. Dispense based on patient and insurance preference. Use up to four times daily as directed. (FOR ICD-10 E10.9, E11.9). 05/09/24   Samtani, Jai-Gurmukh, MD  acetaminophen  (TYLENOL ) 500 MG tablet Take 2 tablets (1,000 mg total) by mouth every 6 (six) hours. 05/09/24   Samtani, Jai-Gurmukh, MD  amoxicillin -clavulanate (AUGMENTIN ) 875-125 MG tablet Take 1 tablet by mouth 2 (two) times daily. 05/10/24   Harden Jerona GAILS, MD  Blood Glucose Monitoring Suppl (BLOOD GLUCOSE MONITOR SYSTEM) w/Device KIT 1 each by Does not apply route as directed. Dispense based on patient and insurance preference. Use up to four times daily as directed. (FOR ICD-10 E10.9, E11.9). 05/09/24   Samtani, Jai-Gurmukh, MD  glimepiride  (AMARYL ) 2 MG tablet Take 1 tablet (2 mg total) by mouth daily with breakfast. 05/10/24   Samtani, Jai-Gurmukh, MD  Glucose Blood (BLOOD GLUCOSE TEST STRIPS) STRP 1 each by Does not apply route as directed. Dispense based on patient and insurance preference. Use up to four times daily as directed. (FOR ICD-10 E10.9, E11.9). 05/09/24   Samtani, Jai-Gurmukh, MD  Lancet Device MISC 1 each by Does not apply route as directed. Dispense based on patient and insurance preference. Use up to four times daily as directed. (FOR ICD-10 E10.9, E11.9). 05/09/24   Samtani, Jai-Gurmukh, MD  metFORMIN  (GLUCOPHAGE ) 1000 MG tablet Take 1 tablet (1,000 mg  total) by mouth 2 (two) times daily with a meal. 05/09/24   Samtani, Jai-Gurmukh, MD  naproxen  (NAPROSYN ) 500 MG tablet Take 1 tablet (500 mg total) by mouth 2 (two) times daily with a meal. 05/09/24   Samtani, Jai-Gurmukh, MD  oxyCODONE  (OXY IR/ROXICODONE ) 5 MG immediate release tablet Take 1-2 tablets (5-10 mg total) by mouth every 4 (four) hours as needed for severe pain (pain score 7-10). 05/09/24   Samtani, Jai-Gurmukh, MD  polyethylene glycol powder (GLYCOLAX /MIRALAX ) 17 GM/SCOOP  powder Take 17 g by mouth daily as needed for mild constipation. Dissolve 1 capful (17g) in 4-8 ounces of liquid and take by mouth daily. 05/09/24   Samtani, Jai-Gurmukh, MD    Allergies: Sulfa antibiotics    Review of Systems  Updated Vital Signs BP (!) 102/53   Pulse 87   Temp 98.8 F (37.1 C)   Resp 14   Ht 5' 7 (1.702 m)   Wt 67 kg   SpO2 95%   BMI 23.13 kg/m   Physical Exam Vitals and nursing note reviewed.  Constitutional:      General: She is in acute distress.     Appearance: She is well-developed. She is ill-appearing.  HENT:     Head: Normocephalic and atraumatic.  Eyes:     Conjunctiva/sclera: Conjunctivae normal.  Cardiovascular:     Rate and Rhythm: Normal rate and regular rhythm.     Pulses: Normal pulses.     Heart sounds: Normal heart sounds. No murmur heard.    No gallop.  Pulmonary:     Effort: Pulmonary effort is normal. No respiratory distress.     Breath sounds: Normal breath sounds.  Abdominal:     Palpations: Abdomen is soft.     Tenderness: There is no abdominal tenderness.  Musculoskeletal:        General: Swelling, tenderness, deformity and signs of injury present.     Cervical back: Neck supple.     Left lower leg: Edema present.     Comments: See photo below.  There is significant erythema, swelling extending from the left lateral foot medially, and mildly proximally toward the ankle.  This entire region is exquisitely tender to palpation.  There is also stitches remaining in place along the left lateral foot, with scant open lesions, and purulent drainage from the lateral aspect.  Patient is exquisitely tender to palpation over the fourth metatarsal, and distally towards the proximal and distal phalanx.  Able to wiggle toes, sensation reportedly intact.  2+ DP/PT pulses.  Skin:    General: Skin is warm and dry.     Capillary Refill: Capillary refill takes 2 to 3 seconds.  Neurological:     Mental Status: She is alert and oriented to  person, place, and time.  Psychiatric:        Mood and Affect: Mood normal.     (all labs ordered are listed, but only abnormal results are displayed) Labs Reviewed  COMPREHENSIVE METABOLIC PANEL WITH GFR - Abnormal; Notable for the following components:      Result Value   Sodium 131 (*)    Chloride 88 (*)    CO2 35 (*)    Glucose, Bld 483 (*)    Total Protein 8.5 (*)    Albumin 3.0 (*)    Alkaline Phosphatase 131 (*)    All other components within normal limits  CBC WITH DIFFERENTIAL/PLATELET - Abnormal; Notable for the following components:   WBC 12.3 (*)    Neutro Abs  9.3 (*)    Abs Immature Granulocytes 0.08 (*)    All other components within normal limits  CULTURE, BLOOD (ROUTINE X 2)  CULTURE, BLOOD (ROUTINE X 2)  HCG, SERUM, QUALITATIVE  I-STAT CG4 LACTIC ACID, ED    EKG: None  Radiology: DG Foot Complete Left Result Date: 06/04/2024 EXAM: 3 VIEW(S) XRAY OF THE LEFT FOOT 06/04/2024 02:39:00 PM COMPARISON: 05/03/2024 status post surgical amputation of the fifth metatarsal and phalanges. CLINICAL HISTORY: diabetic foot wound FINDINGS: BONES AND JOINTS: Status post surgical amputation of the fifth metatarsal and phalanges. Probable lytic destruction involving the cuboid bone and proximal base of the fourth metatarsal concerning for osteomyelitis. SOFT TISSUES: Large ulceration is seen laterally in the midfoot. IMPRESSION: 1. Large lateral midfoot ulceration with lytic destruction involving the cuboid and proximal base of the fourth metatarsal, suspicious for osteomyelitis. Electronically signed by: Lynwood Seip MD 06/04/2024 02:57 PM EST RP Workstation: [id number]    {Document cardiac monitor, telemetry assessment procedure when appropriate:32947} Procedures   Medications Ordered in the ED  oxyCODONE -acetaminophen  (PERCOCET/ROXICET) 5-325 MG per tablet 1 tablet (1 tablet Oral Given 06/04/24 1359)    Clinical Course as of 06/04/24 1649  Sun Jun 04, 2024  1612 Seen by  orthopedic surgery already. [BS]    Clinical Course User Index [BS] Arlee Katz, MD   {Click here for ABCD2, HEART and other calculators REFRESH Note before signing:1}                              Medical Decision Making Amount and/or Complexity of Data Reviewed Radiology: ordered.  Risk Prescription drug management. Decision regarding hospitalization.   ***  {Document critical care time when appropriate  Document review of labs and clinical decision tools ie CHADS2VASC2, etc  Document your independent review of radiology images and any outside records  Document your discussion with family members, caretakers and with consultants  Document social determinants of health affecting pt's care  Document your decision making why or why not admission, treatments were needed:32947:::1}   Final diagnoses:  None    ED Discharge Orders     None

## 2024-06-04 NOTE — ED Provider Triage Note (Signed)
 Emergency Medicine Provider Triage Evaluation Note  Bianca Yang , a 48 y.o. female  was evaluated in triage.  Pt complains of left foot pain. Hx diabetic foot wound, s/p 5th metatarsal amputation. On doxycycline , has a few days left. She is unsure when her follow-up with Dr. Harden is, or when she is supposed to have the stitches removed. Reports she has not seen a doctor since she was discharged on 11/11. Denies fevers or chills. Reports pain is severe  Review of Systems  Positive: Negative:   Physical Exam  BP (!) 102/53   Pulse 87   Temp 98.8 F (37.1 C)   Resp 14   Ht 5' 7 (1.702 m)   Wt 67 kg   SpO2 95%   BMI 23.13 kg/m  Gen:   Awake, no distress   Resp:  Normal effort  MSK:   Moves extremities without difficulty  Other:    Medical Decision Making  Medically screening exam initiated at 1:48 PM.  Appropriate orders placed.  Bianca Yang was informed that the remainder of the evaluation will be completed by another provider, this initial triage assessment does not replace that evaluation, and the importance of remaining in the ED until their evaluation is complete.     Nora Lauraine LABOR, PA-C 06/04/24 1351

## 2024-06-05 ENCOUNTER — Inpatient Hospital Stay (HOSPITAL_COMMUNITY)

## 2024-06-05 DIAGNOSIS — M86172 Other acute osteomyelitis, left ankle and foot: Principal | ICD-10-CM

## 2024-06-05 DIAGNOSIS — L02612 Cutaneous abscess of left foot: Secondary | ICD-10-CM

## 2024-06-05 LAB — C-REACTIVE PROTEIN: CRP: 2.9 mg/dL — ABNORMAL HIGH (ref <1.0)

## 2024-06-05 LAB — CBC
HCT: 38.3 % (ref 36.0–46.0)
Hemoglobin: 11.7 g/dL — ABNORMAL LOW (ref 12.0–15.0)
MCH: 26.4 pg (ref 26.0–34.0)
MCHC: 30.5 g/dL (ref 30.0–36.0)
MCV: 86.5 fL (ref 80.0–100.0)
Platelets: 213 K/uL (ref 150–400)
RBC: 4.43 MIL/uL (ref 3.87–5.11)
RDW: 13.4 % (ref 11.5–15.5)
WBC: 12.2 K/uL — ABNORMAL HIGH (ref 4.0–10.5)
nRBC: 0 % (ref 0.0–0.2)

## 2024-06-05 LAB — SEDIMENTATION RATE: Sed Rate: 41 mm/h — ABNORMAL HIGH (ref 0–22)

## 2024-06-05 LAB — COMPREHENSIVE METABOLIC PANEL WITH GFR
ALT: 12 U/L (ref 0–44)
AST: 13 U/L — ABNORMAL LOW (ref 15–41)
Albumin: 2.9 g/dL — ABNORMAL LOW (ref 3.5–5.0)
Alkaline Phosphatase: 128 U/L — ABNORMAL HIGH (ref 38–126)
Anion gap: 7 (ref 5–15)
BUN: 17 mg/dL (ref 6–20)
CO2: 37 mmol/L — ABNORMAL HIGH (ref 22–32)
Calcium: 9.1 mg/dL (ref 8.9–10.3)
Chloride: 94 mmol/L — ABNORMAL LOW (ref 98–111)
Creatinine, Ser: 0.67 mg/dL (ref 0.44–1.00)
GFR, Estimated: >60 mL/min (ref >60)
Glucose, Bld: 153 mg/dL — ABNORMAL HIGH (ref 70–99)
Potassium: 4.9 mmol/L (ref 3.5–5.1)
Sodium: 138 mmol/L (ref 135–145)
Total Bilirubin: 0.7 mg/dL (ref 0.0–1.2)
Total Protein: 8.1 g/dL (ref 6.5–8.1)

## 2024-06-05 LAB — PHOSPHORUS: Phosphorus: 4.2 mg/dL (ref 2.5–4.6)

## 2024-06-05 LAB — HEPATITIS PANEL, ACUTE
HCV Ab: NONREACTIVE
Hep A IgM: NONREACTIVE
Hep B C IgM: NONREACTIVE
Hepatitis B Surface Ag: NONREACTIVE

## 2024-06-05 LAB — CBG MONITORING, ED
Glucose-Capillary: 349 mg/dL — ABNORMAL HIGH (ref 70–99)
Glucose-Capillary: 215 mg/dL — ABNORMAL HIGH (ref 70–99)

## 2024-06-05 LAB — CK: Total CK: 26 U/L — ABNORMAL LOW (ref 38–234)

## 2024-06-05 LAB — MAGNESIUM: Magnesium: 1.4 mg/dL — ABNORMAL LOW (ref 1.7–2.4)

## 2024-06-05 LAB — PREALBUMIN: Prealbumin: 17 mg/dL — ABNORMAL LOW (ref 18–38)

## 2024-06-05 LAB — GLUCOSE, CAPILLARY
Glucose-Capillary: 231 mg/dL — ABNORMAL HIGH (ref 70–99)
Glucose-Capillary: 156 mg/dL — ABNORMAL HIGH (ref 70–99)
Glucose-Capillary: 268 mg/dL — ABNORMAL HIGH (ref 70–99)

## 2024-06-05 MED ORDER — BENZOCAINE 10 % MT GEL
Freq: Four times a day (QID) | OROMUCOSAL | Status: DC | PRN
  Filled 2024-06-05 (×2): qty 9

## 2024-06-05 MED ORDER — PROSOURCE PLUS PO LIQD
30.0000 mL | Freq: Two times a day (BID) | ORAL | Status: DC
  Administered 2024-06-05 – 2024-06-12 (×10): 30 mL via ORAL
  Filled 2024-06-05 (×11): qty 30

## 2024-06-05 MED ORDER — INSULIN GLARGINE-YFGN 100 UNIT/ML ~~LOC~~ SOLN: 10.0000 [IU] | Freq: Every day | SUBCUTANEOUS | Status: DC

## 2024-06-05 MED ORDER — ADULT MULTIVITAMIN W/MINERALS CH
1.0000 | ORAL_TABLET | Freq: Every day | ORAL | Status: DC
  Administered 2024-06-05 – 2024-06-23 (×19): 1 via ORAL
  Filled 2024-06-05 (×19): qty 1

## 2024-06-05 MED ORDER — INSULIN GLARGINE 100 UNIT/ML ~~LOC~~ SOLN
10.0000 [IU] | Freq: Every day | SUBCUTANEOUS | Status: DC
  Administered 2024-06-05: 10 [IU] via SUBCUTANEOUS
  Filled 2024-06-05 (×2): qty 0.1

## 2024-06-05 MED ORDER — OXYCODONE HCL 5 MG PO TABS
10.0000 mg | ORAL_TABLET | Freq: Four times a day (QID) | ORAL | Status: DC | PRN
  Administered 2024-06-05 – 2024-06-08 (×5): 10 mg via ORAL
  Filled 2024-06-05 (×5): qty 2

## 2024-06-05 MED ORDER — MAGNESIUM SULFATE 2 GM/50ML IV SOLN
2.0000 g | Freq: Once | INTRAVENOUS | Status: AC
  Administered 2024-06-05: 2 g via INTRAVENOUS
  Filled 2024-06-05: qty 50

## 2024-06-05 MED ORDER — INSULIN ASPART 100 UNIT/ML IJ SOLN: 0.0000 [IU] | Freq: Three times a day (TID) | INTRAMUSCULAR | Status: DC

## 2024-06-05 MED ORDER — LIVING WELL WITH DIABETES BOOK
Freq: Once | Status: AC
  Filled 2024-06-05: qty 1

## 2024-06-05 MED ORDER — INSULIN STARTER KIT- PEN NEEDLES (ENGLISH)
1.0000 | Freq: Once | Status: AC
  Administered 2024-06-05: 1
  Filled 2024-06-05: qty 1

## 2024-06-05 MED ORDER — PROSOURCE PLUS PO LIQD: 30.0000 mL | Freq: Two times a day (BID) | ORAL | Status: DC

## 2024-06-05 MED ORDER — INSULIN ASPART 100 UNIT/ML IJ SOLN
0.0000 [IU] | Freq: Every day | INTRAMUSCULAR | Status: DC
  Administered 2024-06-05 – 2024-06-06 (×2): 3 [IU] via SUBCUTANEOUS
  Administered 2024-06-07: 4 [IU] via SUBCUTANEOUS
  Administered 2024-06-13 – 2024-06-20 (×3): 2 [IU] via SUBCUTANEOUS
  Administered 2024-06-22: 3 [IU] via SUBCUTANEOUS
  Filled 2024-06-05 (×2): qty 3
  Filled 2024-06-05: qty 2
  Filled 2024-06-05: qty 4
  Filled 2024-06-05: qty 3
  Filled 2024-06-05 (×2): qty 2

## 2024-06-05 MED ORDER — INSULIN GLARGINE 100 UNIT/ML ~~LOC~~ SOLN
10.0000 [IU] | Freq: Every day | SUBCUTANEOUS | Status: DC
  Filled 2024-06-05: qty 0.1

## 2024-06-05 MED ORDER — MAGNESIUM OXIDE -MG SUPPLEMENT 400 (240 MG) MG PO TABS
400.0000 mg | ORAL_TABLET | Freq: Two times a day (BID) | ORAL | Status: DC
  Administered 2024-06-05 – 2024-06-23 (×36): 400 mg via ORAL
  Filled 2024-06-05 (×36): qty 1

## 2024-06-05 MED ORDER — GLUCERNA SHAKE PO LIQD
237.0000 mL | Freq: Three times a day (TID) | ORAL | Status: DC
  Administered 2024-06-05 – 2024-06-12 (×14): 237 mL via ORAL

## 2024-06-05 MED ORDER — JUVEN PO PACK
1.0000 | PACK | Freq: Two times a day (BID) | ORAL | Status: DC
  Administered 2024-06-05: 1
  Filled 2024-06-05: qty 1

## 2024-06-05 MED ORDER — INSULIN ASPART 100 UNIT/ML IJ SOLN
0.0000 [IU] | Freq: Three times a day (TID) | INTRAMUSCULAR | Status: DC
  Administered 2024-06-05: 5 [IU] via SUBCUTANEOUS
  Administered 2024-06-05: 3 [IU] via SUBCUTANEOUS
  Administered 2024-06-06 (×3): 5 [IU] via SUBCUTANEOUS
  Administered 2024-06-07: 8 [IU] via SUBCUTANEOUS
  Administered 2024-06-07: 11 [IU] via SUBCUTANEOUS
  Administered 2024-06-07: 8 [IU] via SUBCUTANEOUS
  Administered 2024-06-08: 3 [IU] via SUBCUTANEOUS
  Administered 2024-06-08: 2 [IU] via SUBCUTANEOUS
  Administered 2024-06-08: 11 [IU] via SUBCUTANEOUS
  Administered 2024-06-09: 2 [IU] via SUBCUTANEOUS
  Administered 2024-06-09 – 2024-06-10 (×3): 5 [IU] via SUBCUTANEOUS
  Administered 2024-06-10: 3 [IU] via SUBCUTANEOUS
  Administered 2024-06-10 – 2024-06-11 (×2): 8 [IU] via SUBCUTANEOUS
  Filled 2024-06-05: qty 8
  Filled 2024-06-05: qty 11
  Filled 2024-06-05 (×2): qty 8
  Filled 2024-06-05: qty 2
  Filled 2024-06-05: qty 5
  Filled 2024-06-05: qty 3
  Filled 2024-06-05 (×2): qty 5
  Filled 2024-06-05: qty 3
  Filled 2024-06-05: qty 11
  Filled 2024-06-05 (×2): qty 5
  Filled 2024-06-05: qty 1
  Filled 2024-06-05: qty 5
  Filled 2024-06-05: qty 8

## 2024-06-05 MED ORDER — HYDROMORPHONE HCL 1 MG/ML IJ SOLN
0.5000 mg | INTRAMUSCULAR | Status: DC | PRN
  Administered 2024-06-05 – 2024-06-08 (×8): 0.5 mg via INTRAVENOUS
  Filled 2024-06-05 (×8): qty 1

## 2024-06-05 MED ORDER — NITROGLYCERIN 0.2 MG/HR TD PT24
0.2000 mg | MEDICATED_PATCH | Freq: Every day | TRANSDERMAL | Status: DC
  Administered 2024-06-05 – 2024-06-23 (×19): 0.2 mg via TRANSDERMAL
  Filled 2024-06-05 (×20): qty 1

## 2024-06-05 MED ORDER — JUVEN PO PACK
1.0000 | PACK | Freq: Two times a day (BID) | ORAL | Status: DC
  Administered 2024-06-06 – 2024-06-23 (×28): 1 via ORAL
  Filled 2024-06-05 (×27): qty 1

## 2024-06-05 NOTE — NC FL2 (Signed)
 [place]  MEDICAID [id number] LEVEL OF CARE FORM     IDENTIFICATION  Patient Name: Bianca Yang Birthdate: 1976-02-04 Sex: female Admission Date (Current Location): 06/04/2024  [facility] and [place] Number:  Producer, Television/film/video and Address:  [facility] [place], [address] [place], [place] [address]      Provider Number: [id number]  Attending Physician Name and Address:  Sonjia Held, MD  Relative Name and Phone Number:  Cherril, Hett (Daughter)  [phone] (    Current Level of Care: Hospital Recommended Level of Care: Skilled Nursing Facility Prior Approval Number:    Date Approved/Denied:   PASRR Number: [id number]  Discharge Plan: SNF    Current Diagnoses: Patient Active Problem List   Diagnosis Date Noted   Acute osteomyelitis of metatarsal bone of left foot (HCC) 06/05/2024   Cellulitis and abscess of toe of left foot 06/05/2024   Osteomyelitis of left foot (HCC) 06/04/2024   Hypercapnia 06/04/2024   Cutaneous abscess of left foot 05/04/2024   Diabetic foot infection (HCC) 05/03/2024   Trichomonas vaginitis 01/17/2020   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   Bacterial vaginosis 01/17/2020   Overweight (BMI 25.0-29.9) 01/17/2020   Cocaine abuse (HCC) 01/17/2020   Osteomyelitis (HCC) 01/16/2020   Cellulitis 11/05/2016   Cellulitis in diabetic foot (HCC) 11/04/2016   Intractable vomiting    DM (diabetes mellitus), 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   Hypoxia 01/14/2016   Tubo-ovarian abscess 01/13/2016   CONTACT DERMATITIS 12/04/2008    Orientation RESPIRATION BLADDER Height & Weight     Self, Time, Situation, Place  Normal Continent Weight: 147 lb 11.3 oz (67 kg) Height:  5' 7 (170.2 cm)  BEHAVIORAL SYMPTOMS/MOOD NEUROLOGICAL BOWEL NUTRITION STATUS      Continent Diet (Please see discharge summary)  AMBULATORY STATUS COMMUNICATION OF NEEDS Skin   Extensive Assist Verbally Normal                       Personal Care  Assistance Level of Assistance  Bathing, Dressing, Feeding Bathing Assistance:  (Please see discharge summary) Feeding assistance:  (Please see discharge summary) Dressing Assistance:  (Please see discharge summary)     Functional Limitations Info  Sight, Speech, Hearing Sight Info: Impaired (R and L) Hearing Info: Impaired (R and L) Speech Info: Adequate    SPECIAL CARE FACTORS FREQUENCY  PT (By licensed PT), OT (By licensed OT)     PT Frequency: 5x week OT Frequency: 5x week            Contractures Contractures Info: Not present    Additional Factors Info  Code Status, Allergies, Insulin  Sliding Scale Code Status Info: full Allergies Info: Sulfa Antibiotics   Insulin  Sliding Scale Info: Please see discharge summary       Current Medications (06/05/2024):  This is the current hospital active medication list Current Facility-Administered Medications  Medication Dose Route Frequency Provider Last Rate Last Admin   acetaminophen  (TYLENOL ) tablet 650 mg  650 mg Oral Q6H PRN Doutova, Anastassia, MD   650 mg at 06/05/24 0936   Or   acetaminophen  (TYLENOL ) suppository 650 mg  650 mg Rectal Q6H PRN Doutova, Anastassia, MD       Ampicillin -Sulbactam (UNASYN ) 3 g in sodium chloride  0.9 % 100 mL IVPB  3 g Intravenous Q6H Doutova, Anastassia, MD 200 mL/hr at 06/05/24 0942 3 g at 06/05/24 0942   HYDROcodone -acetaminophen  (NORCO/VICODIN) 5-325 MG per tablet 1-2 tablet  1-2 tablet Oral Q4H PRN Doutova, Anastassia,  MD   1 tablet at 06/04/24 2315   HYDROmorphone  (DILAUDID ) injection 0.5 mg  0.5 mg Intravenous Q4H PRN Pokhrel, Laxman, MD       insulin  aspart (novoLOG ) injection 0-15 Units  0-15 Units Subcutaneous TID WC Pokhrel, Laxman, MD   5 Units at 06/05/24 1246   insulin  aspart (novoLOG ) injection 0-5 Units  0-5 Units Subcutaneous QHS Pokhrel, Laxman, MD       insulin  glargine (LANTUS ) injection 10 Units  10 Units Subcutaneous Daily Pokhrel, Laxman, MD       insulin  starter kit- pen  needles (English) 1 kit  1 kit Other Once Pokhrel, Laxman, MD       magnesium  oxide (MAG-OX) tablet 400 mg  400 mg Oral BID Pokhrel, Laxman, MD       nitroGLYCERIN  (NITRODUR - Dosed in mg/24 hr) patch 0.2 mg  0.2 mg Transdermal Daily Duda, Marcus V, MD   0.2 mg at 06/05/24 9062   ondansetron  (ZOFRAN ) tablet 4 mg  4 mg Oral Q6H PRN Doutova, Anastassia, MD       Or   ondansetron  (ZOFRAN ) injection 4 mg  4 mg Intravenous Q6H PRN Doutova, Anastassia, MD       oxyCODONE  (Oxy IR/ROXICODONE ) immediate release tablet 10 mg  10 mg Oral Q6H PRN Pokhrel, Laxman, MD       vancomycin  (VANCOCIN ) 1,750 mg in sodium chloride  0.9 % 500 mL IVPB  1,750 mg Intravenous Q24H Doutova, Anastassia, MD         Discharge Medications: Please see discharge summary for a list of discharge medications.  Relevant Imaging Results:  Relevant Lab Results:   Additional Information SSN [id number]  [name], LCSWA

## 2024-06-05 NOTE — Progress Notes (Addendum)
 PROGRESS NOTE  CHARDE MACFARLANE FMW:[id number] DOB: 1975-10-14 DOA: 06/04/2024 PCP: Patient, [name] Per   LOS: 1 day   Brief narrative:  Bianca Yang is a 48 y.o. female with past medical history significant of DM2, diabetic foot ulcer, OSA, PAD presented to hospital with left foot pain and swelling.  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  In the ED patient was afebrile.  Labs were notable for mild leukocytosis but lactate was within normal range.  BMP showed mild hyponatremia with sodium of 131 with a glucose elevation at 483.  ESR was elevated at 41 with CRP of 2.9.  X-ray of the left foot showed large lateral midfoot ulceration with lytic destruction of the cuboid and proximal base of the fourth metatarsal suspicious osteomyelitis.  CT scan of the foot was then performed which showed acute osteomyelitis involving the fourth metatarsal and cuboid.  Patient was then considered for admission to the hospital for further evaluation and treatment    Assessment/Plan: Principal Problem:   Osteomyelitis of left foot (HCC) Active Problems:   Cocaine abuse (HCC)   Diabetic foot infection (HCC)   Hypercapnia   Acute osteomyelitis of metatarsal bone of left foot (HCC)   Cellulitis and abscess of toe of left foot   Diabetic foot infection with osteomyelitis of the left foot metatarsal, wound dehiscence.. Elevated CRP ESR.  CT scan and x-ray of the foot suggestive of osteomyelitis.  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  Dr. Harden has seen the patient and advised nonweightbearing of the left lower extremities.  Plan is to continue IV antibiotics for now.  Continue Unasyn  and vancomycin . History of peripheral vascular disease.   DM (diabetes mellitus), type 2 with hyperglycemia. Continue sliding scale insulin  Accu-Cheks at this time.  Add long-acting insulin  at nighttime.   Latest hemoglobin A1c of 11.6 a month back will need better glycemic control at home.  Will consult dietitian, diabetic coordinator.  Patient will need glycemic control.   Obstructive sleep apnea/OSA Patient with history of sleep apnea not compliant with CPAP.  VBG was obtained in the emergency department and showed evidence of hypercapnia.  Will encourage CPAP placement while in the hospital.  Social issues.  Patient might need more support at home.  TOC consulted.  DVT prophylaxis: SCDs Start: 06/04/24 1922   Disposition: Uncertain   Status is: Inpatient Remains inpatient appropriate because: IV antibiotic, pending clinical improvement    Code Status:     Code Status: Full Code  Family Communication: None at bedside  Consultants: Orthopedics Dr Harden  Procedures: None  Anti-infectives:  Vancomycin  and Unasyn  IV  Anti-infectives (From admission, onward)    Start     Dose/Rate Route Frequency Ordered Stop   06/05/24 1700  vancomycin  (VANCOCIN ) 1,750 mg in sodium chloride  0.9 % 500 mL IVPB        1,750 mg 258.8 mL/hr over 120 Minutes Intravenous Every 24 hours 06/04/24 1945     06/04/24 2300  Ampicillin -Sulbactam (UNASYN ) 3 g in sodium chloride  0.9 % 100 mL IVPB        3 g 200 mL/hr over 30 Minutes Intravenous Every 6 hours 06/04/24 1945     06/04/24 1630  Vancomycin  (VANCOCIN ) 1,250 mg in sodium chloride  0.9 % 250 mL IVPB        1,250 mg 166.7 mL/hr over 90 Minutes  Intravenous  Once 06/04/24 1622 06/04/24 1924   06/04/24 1630  piperacillin -tazobactam (ZOSYN ) IVPB 3.375 g        3.375 g 100 mL/hr over 30 Minutes Intravenous  Once 06/04/24 1622 06/04/24 1720   06/04/24 1615  vancomycin  (VANCOCIN ) 2,000 mg in sodium chloride  0.9 % 500 mL IVPB  Status:  Discontinued        2,000 mg 260 mL/hr over 120 Minutes Intravenous  Once 06/04/24 1611 06/04/24 1622   06/04/24 1615  piperacillin -tazobactam (ZOSYN ) IVPB 4.5 g  Status:  Discontinued        4.5 g 200 mL/hr over 30  Minutes Intravenous  Once 06/04/24 1611 06/04/24 1622        Subjective: Today, patient was seen and examined at bedside.  Complains of foot discomfort.  Denies any nausea, vomiting, fever, chills or rigor.  Objective: Vitals:   06/05/24 0820 06/05/24 0932  BP:    Pulse:  85  Resp: 10 10  Temp:    SpO2:  91%   No intake or output data in the 24 hours ending 06/05/24 1011 Filed Weights   06/04/24 1252  Weight: 67 kg   Body mass index is 23.13 kg/m.   Physical Exam: GENERAL: Patient is alert awake and oriented. Not in obvious distress.  Appears chronically ill HENT: No scleral pallor or icterus. Pupils equally reactive to light. Oral mucosa is moist NECK: is supple, no gross swelling noted. CHEST: Clear to auscultation. No crackles or wheezes.  Diminished breath sounds bilaterally. CVS: S1 and S2 heard, no murmur. Regular rate and rhythm.  ABDOMEN: Soft, non-tender, bowel sounds are present. EXTREMITIES: No edema.  Cellulitis of left lateral foot with sutures in place and central area of blackish discoloration. CNS: Cranial nerves are intact. No focal motor deficits. SKIN: warm and dry, left foot with cellulitis  Data Review: I have personally reviewed the following laboratory data and studies,  CBC: Recent Labs  Lab 06/04/24 1348 06/04/24 1648 06/05/24 0110  WBC 12.3*  --  12.2*  NEUTROABS 9.3*  --   --   HGB 12.8 13.6 11.7*  HCT 41.7 40.0 38.3  MCV 85.3  --  86.5  PLT 240  --  213   Basic Metabolic Panel: Recent Labs  Lab 06/04/24 1348 06/04/24 1648 06/05/24 0110  NA 131* 131* 138  K 4.8 4.8 4.9  CL 88*  --  94*  CO2 35*  --  37*  GLUCOSE 483*  --  153*  BUN 16  --  17  CREATININE 0.58  --  0.67  CALCIUM  9.6  --  9.1  MG  --   --  1.4*  PHOS  --   --  4.2   Liver Function Tests: Recent Labs  Lab 06/04/24 1348 06/05/24 0110  AST 15 13*  ALT 14 12  ALKPHOS 131* 128*  BILITOT 0.3 0.7  PROT 8.5* 8.1  ALBUMIN 3.0* 2.9*   No results for  input(s): LIPASE, AMYLASE in the last 168 hours. No results for input(s): AMMONIA in the last 168 hours. Cardiac Enzymes: Recent Labs  Lab 06/05/24 0110  CKTOTAL 26*   BNP (last 3 results) No results for input(s): BNP in the last 8760 hours.  ProBNP (last 3 results) No results for input(s): PROBNP in the last 8760 hours.  CBG: Recent Labs  Lab 06/04/24 2046 06/04/24 2322 06/05/24 0434 06/05/24 0624  GLUCAP 446* 289* 349* 215*   Recent Results (from the past 240 hours)  Blood  culture (routine x 2)     Status: None (Preliminary result)   Collection Time: 06/04/24  1:51 PM   Specimen: BLOOD  Result Value Ref Range Status   Specimen Description BLOOD SITE NOT SPECIFIED  Final   Special Requests   Final    BOTTLES DRAWN AEROBIC AND ANAEROBIC Blood Culture adequate volume   Culture   Final    NO GROWTH < 24 HOURS Performed at [facility], [address] [place], [place] [address]    Report Status PENDING  Incomplete  Blood culture (routine x 2)     Status: None (Preliminary result)   Collection Time: 06/05/24  1:10 AM   Specimen: BLOOD  Result Value Ref Range Status   Specimen Description BLOOD RIGHT ANTECUBITAL  Final   Special Requests   Final    BOTTLES DRAWN AEROBIC AND ANAEROBIC Blood Culture adequate volume   Culture   Final    NO GROWTH < 12 HOURS Performed at [facility], [address] [place], [place] [address]    Report Status PENDING  Incomplete     Studies: CT FOOT LEFT W CONTRAST Result Date: 06/04/2024 EXAM: CT OF THE LEFT FOOT, WITH IV CONTRAST 06/04/2024 05:32:39 PM TECHNIQUE: Axial images were acquired through the left foot with IV contrast. Reformatted images were reviewed. Automated exposure control, iterative reconstruction, and/or weight based adjustment of the mA/kV was utilized to reduce the radiation dose to as low as reasonably achievable. COMPARISON: None available. CLINICAL HISTORY: Pain and inflammation. Prior 5th  ray amputation on 05/05/2024. Radiography of 06/04/2024 showed gas in the lateral soft tissues of the foot with lytic destruction of the cuboid and fourth metatarsal. FINDINGS: BONES: Prior 5th ray amputation. Bony demineralization and bony destructive findings in the base of the 4th metatarsal and the cuboid compatible with acute osteomyelitis. Plantar and achilles calcaneal spurs. JOINTS: Gas density tracks into the margin of the articular space between the 4th metatarsal base and the cuboid. No dislocation. The joint spaces are normal, except for the involvement of the 4th metatarsal-cuboid articulation by osteomyelitis. SOFT TISSUES: Abnormal spicle gas density tracks in the subcutaneous tissues of the left foot. Diffuse subcutaneous edema dorsally and laterally along the foot. This likely reflects cellulitis. Ulceration along the lateral foot especially at the level of the lumbosacral joint. Anterior convexity of the achilles tendon contour compatible with mild distal achilles tendinopathy. IMPRESSION: 1. Acute osteomyelitis involving the base of the fourth metatarsal and the cuboid, with associated gas in the subcutaneous tissues and along the articular margin. 2. Diffuse dorsal and lateral subcutaneous edema of the foot, likely cellulitis. 3. Lateral foot ulceration. 4. Mild distal Achilles tendinopathy with anterior convexity of the Achilles tendon contour. 5. Plantar and Achilles calcaneal spurs. Electronically signed by: Ryan Salvage MD 06/04/2024 05:42 PM EST RP Workstation: [id number]   DG Foot Complete Left Result Date: 06/04/2024 EXAM: 3 VIEW(S) XRAY OF THE LEFT FOOT 06/04/2024 02:39:00 PM COMPARISON: 05/03/2024 status post surgical amputation of the fifth metatarsal and phalanges. CLINICAL HISTORY: diabetic foot wound FINDINGS: BONES AND JOINTS: Status post surgical amputation of the fifth metatarsal and phalanges. Probable lytic destruction involving the cuboid bone and proximal base of the  fourth metatarsal concerning for osteomyelitis. SOFT TISSUES: Large ulceration is seen laterally in the midfoot. IMPRESSION: 1. Large lateral midfoot ulceration with lytic destruction involving the cuboid and proximal base of the fourth metatarsal, suspicious for osteomyelitis. Electronically signed by: Lynwood Seip MD 06/04/2024 02:57 PM EST RP  Workstation: [id number]      Vernal Alstrom, MD  [facility] 06/05/2024  If 7PM-7AM, please contact night-coverage

## 2024-06-05 NOTE — Progress Notes (Signed)
 Initial Nutrition Assessment  DOCUMENTATION CODES:  Not applicable  INTERVENTION:  Continue Carb modified diet as ordered Glucerna Shake po TID between meals, each supplement provides 220 kcal and 10 grams of protein With breakfast and dinner, provide: 30 ml ProSource Plus BID, each supplement provides 100 kcals and 15 grams protein.    Juven BID to support wound healing MVI with minerals daily Plate Method and Carbohydrate Counting for People with Diabetes handouts added to AVS  NUTRITION DIAGNOSIS:  Increased nutrient needs related to wound healing as evidenced by estimated needs.  GOAL:  Patient will meet greater than or equal to 90% of their needs  MONITOR:  PO intake, Supplement acceptance, Labs, Weight trends, Skin  REASON FOR ASSESSMENT:  Consult Wound healing  ASSESSMENT:  Pt admitted with foot pain d/t osteomyelitis following recent admission with fifth ray amputation on 05/05/24. PMH significant for DM2, diabetic foot ulcer, PAD.  CT scan on admission with findings of acute osteomyelitis involving the fourth metatarsal and cuboid.   Ortho plan for nonweightbearing of LLE and IV abx.  TOC working on SNF placement per PT recommendations.   Attempted to obtain detailed nutrition history however assessment was brief as pt preparing to use the restroom. She reports having a good appetite at baseline. She is noticeably frustrated at time of visit pending nursing care, therefore additional nutrition related history is limited and alerted nurse tech.   Explained to pt that RD will order supplements to optimize wound healing however maintaining adequate glucose control will also be an important factor in wound healing. Pt is amenable to plan for nutrition supplements. RD will attempt further nutrition education and assessment prior to discharge as time allows, however will add education to AVS.   No meal completions on file to review at this time.   Limited history of  weights on file to review within the last year. Of note since 2019, pt's weight has trended down from ~84 kg to 67.1 kg in November 2025.  Medications: SSI 0-15 units TID, SSI 0-5 units at bedtime, lantus  10 units daily, mag-ox, IV abx x2  Labs:  Chloride 94 Mg 1.4 Alkaline phosphatase 128 AST 13 CBG's 215-446 x24 hours HgbA1c 11.6% (05/04/24)  NUTRITION - FOCUSED PHYSICAL EXAM: Deferred to follow up, pt getting ready to use the restroom.  Diet Order:   Diet Order             Diet Carb Modified Fluid consistency: Thin; Room service appropriate? Yes  Diet effective now                   EDUCATION NEEDS:  Not appropriate for education at this time  Skin:  Skin Assessment: Reviewed RN Assessment (dehisence of lateral left foot)  Last BM:  unknown/PTA  Height:  Ht Readings from Last 1 Encounters:  06/04/24 5' 7 (1.702 m)    Weight:  Wt Readings from Last 1 Encounters:  06/04/24 67 kg   BMI:  Body mass index is 23.13 kg/m.  Estimated Nutritional Needs:   Kcal:  1700-1900  Protein:  95-110g  Fluid:  >/=1.7L  Royce Maris, RDN, LDN Clinical Nutrition See AMiON for contact information.

## 2024-06-05 NOTE — Evaluation (Signed)
 Occupational Therapy Evaluation Patient Details Name: Bianca Yang MRN: [id number] DOB: January 07, 1976 Today's Date: 06/05/2024   History of Present Illness   Pt is a 48 y.o. F presenting to [facility] on 06/04/24 with L foot pain and swelling following L foot 5th ray amputation on 05/05/24. PMH is significant for DM, cocaine abuse.     Clinical Impressions Pt c/o pain 10/10 to L foot and calf. Pt lives at her father's house, who has been helping her with ADLs and mobility as needed for the last month since her surgery. Pt reports she was heel WB but pain was getting worse over time. Pt currently NWB to LLE and overall requires set up/CGA for ADLs and mobility. Pt impulsive and restless, decreased safety awareness when ambulating or transferring with RW for support increasing fall risk. Pt would benefit from postacute rehab <3hrs/day to maximize safety with mobility and allow for time to recover to allow for WB through LLE prior to going home, but may progress to [facility] follow up. Pt reports w/c would not fit around house but would be useful for community. DME recs TBD after SNF stay, will reassess DME needs if change to [facility] follow up. Will continue to see acutely to progress as able.      If plan is discharge home, recommend the following:   A little help with walking and/or transfers;A little help with bathing/dressing/bathroom;Assistance with cooking/housework;Assist for transportation;Help with stairs or ramp for entrance     Functional Status Assessment   Patient has had a recent decline in their functional status and demonstrates the ability to make significant improvements in function in a reasonable and predictable amount of time.     Equipment Recommendations   Other (comment) (defer if going to SNF, if going home then wheelchair with cushion, tub bench, and BSC)     Recommendations for Other Services         Precautions/Restrictions   Precautions Precautions: Fall Recall  of Precautions/Restrictions: Impaired Restrictions Weight Bearing Restrictions Per Provider Order: Yes LLE Weight Bearing Per Provider Order: Non weight bearing     Mobility Bed Mobility Overal bed mobility: Modified Independent                  Transfers Overall transfer level: Needs assistance Equipment used: Rolling walker (2 wheels) Transfers: Sit to/from Stand Sit to Stand: Contact guard assist           General transfer comment: CGA with RW, decreased safety awareness, impulsive, cueing for slowing down during transfers      Balance Overall balance assessment: Needs assistance Sitting-balance support: No upper extremity supported, Feet supported Sitting balance-Leahy Scale: Good     Standing balance support: Bilateral upper extremity supported, During functional activity, Reliant on assistive device for balance Standing balance-Leahy Scale: Poor Standing balance comment: reliant on RW for support, NWB to LLE                           ADL either performed or assessed with clinical judgement   ADL Overall ADL's : Needs assistance/impaired                                       General ADL Comments: set up/supervision for safety with ADLs, CGA for mobility with RW and cueing for safety and to slow down as she rushes through tasks  Vision Baseline Vision/History: 1 Wears glasses Ability to See in Adequate Light: 0 Adequate Patient Visual Report: No change from baseline       Perception         Praxis         Pertinent Vitals/Pain Pain Assessment Pain Assessment: 0-10 Pain Score: 8  Pain Location: L foot Pain Descriptors / Indicators: Discomfort, Guarding, Grimacing Pain Intervention(s): Monitored during session     Extremity/Trunk Assessment Upper Extremity Assessment Upper Extremity Assessment: Defer to OT evaluation   Lower Extremity Assessment Lower Extremity Assessment: Generalized weakness   Cervical /  Trunk Assessment Cervical / Trunk Assessment: Kyphotic   Communication Communication Communication: No apparent difficulties Factors Affecting Communication: Difficulty expressing self   Cognition Arousal: Alert Behavior During Therapy: Impulsive, Restless Cognition: No family/caregiver present to determine baseline             OT - Cognition Comments: Pt grossly A/O, impulsive with decreased safety awareness, restless.                 Following commands: Intact       Cueing  General Comments   Cueing Techniques: Verbal cues  no signs of acute distress   Exercises     Shoulder Instructions      Home Living Family/patient expects to be discharged to:: Private residence Living Arrangements: Alone Available Help at Discharge: Family;Available 24 hours/day Type of Home: House Home Access: Stairs to enter [employer] of Steps: 4 large, concrete steps Entrance Stairs-Rails: None Home Layout: One level     Bathroom Shower/Tub: Chief Strategy Officer: Standard     Home Equipment: Agricultural Consultant (2 wheels)   Additional Comments: Pt reports she can stay with her dad who can assist with meals      Prior Functioning/Environment Prior Level of Function : Needs assist             Mobility Comments: pt reoprts using RW since recent surgery for all mobility needs ADLs Comments: pt reports her father has been helping her with bathing, toileting, and dressing    OT Problem List: Decreased activity tolerance;Impaired balance (sitting and/or standing);Decreased safety awareness;Pain   OT Treatment/Interventions: Self-care/ADL training;Therapeutic exercise;Energy conservation;DME and/or AE instruction;Therapeutic activities;Patient/family education;Balance training      OT Goals(Current goals can be found in the care plan section)   Acute Rehab OT Goals Patient Stated Goal: to get rehab prior to going home OT Goal Formulation: With  patient Time For Goal Achievement: 06/19/24 Potential to Achieve Goals: Good   OT Frequency:  Min 2X/week    Co-evaluation              AM-PAC OT 6 Clicks Daily Activity     Outcome Measure Help from another person eating meals?: None Help from another person taking care of personal grooming?: A Little Help from another person toileting, which includes using toliet, bedpan, or urinal?: A Little Help from another person bathing (including washing, rinsing, drying)?: A Little Help from another person to put on and taking off regular upper body clothing?: A Little Help from another person to put on and taking off regular lower body clothing?: A Little 6 Click Score: 19   End of Session Equipment Utilized During Treatment: Gait belt;Rolling walker (2 wheels) Nurse Communication: Mobility status  Activity Tolerance: Patient tolerated treatment well Patient left: in bed;with call bell/phone within reach;with bed alarm set  OT Visit Diagnosis: Unsteadiness on feet (R26.81);Other abnormalities of gait and mobility (  R26.89);Pain;Other symptoms and signs involving cognitive function Pain - Right/Left: Left Pain - part of body: Ankle and joints of foot;Leg                Time: 1335-1406 OT Time Calculation (min): 31 min Charges:  OT General Charges $OT Visit: 1 Visit OT Evaluation $OT Eval Low Complexity: 1 Low OT Treatments $Self Care/Home Management : 8-22 mins  [place], OTR/L   Elouise JONELLE Bott 06/05/2024, 2:15 PM

## 2024-06-05 NOTE — Evaluation (Signed)
 Physical Therapy Evaluation Patient Details Name: Bianca Yang MRN: [id number] DOB: 1976-05-12 Today's Date: 06/05/2024  History of Present Illness  Pt is a 48 y.o. 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 PMH is significant for DM, cocaine abuse.  Clinical Impression  Prior to admittance pt was receiving physical assistance for ADLs and mobility following recent L foot 5th ray amputation, utilizing RW for mobility. Pt presents to evaluation with deficits in mobility, strength, power, activity tolerance, pain, balance, and cognition, all affecting pt's ability to mobilize safely. Pt was able to perform stand pivot transfer with HHA and minimal physical assistance. Pt requires heavy verbal cueing for sequencing and frequent reorienting to mobility tasks with several instances of impulsivity as pt attempts to mobilize without prompting. Further mobility deferred due to bout of incontinence requiring increased time for peri care. PT will continue to treat pt while she is admitted. Given pt's current LOF and amount of assistance needed, recommending continued inpatient follow up therapy, <3 hours/day.         If plan is discharge home, recommend the following: A lot of help with walking and/or transfers;A lot of help with bathing/dressing/bathroom;Assistance with cooking/housework;Direct supervision/assist for medications management;Direct supervision/assist for financial management;Assist for transportation;Help with stairs or ramp for entrance   Can travel by private vehicle   No    Equipment Recommendations Wheelchair (measurements PT);Wheelchair cushion (measurements PT)  Recommendations for Other Services       Functional Status Assessment Patient has had a recent decline in their functional status and demonstrates the ability to make significant improvements in function in a reasonable and predictable amount of time.      Precautions / Restrictions Precautions Precautions: Fall Recall of Precautions/Restrictions: Impaired Restrictions Weight Bearing Restrictions Per Provider Order: Yes LLE Weight Bearing Per Provider Order: Non weight bearing      Mobility  Bed Mobility Overal bed mobility: Needs Assistance Bed Mobility: Supine to Sit, Sit to Supine     Supine to sit: Contact guard Sit to supine: Contact guard assist   General bed mobility comments: Increased time to complete. VC given for sequencing    Transfers Overall transfer level: Needs assistance Equipment used: 1 person hand held assist Transfers: Sit to/from Stand, Bed to chair/wheelchair/BSC Sit to Stand: Min assist Stand pivot transfers: Min assist         General transfer comment: Pt completed sit to stand from EOB w/ 1 HHA and then transferred between bed and bedside commode performing a pivot with BUE support on BSC arms. Pt requires min A for maintaining upright and for steadying throughout transfer. VC given for maintaining WB restrictions.    Ambulation/Gait Ambulation/Gait assistance:  (deferred due to bout of incontinence)                Stairs            Wheelchair Mobility     Tilt Bed    Modified Rankin (Stroke Patients Only)       Balance Overall balance assessment: Needs assistance Sitting-balance support: No upper extremity supported, Feet supported Sitting balance-Leahy Scale: Fair Sitting balance - Comments: seated EOB   Standing balance support: Bilateral upper extremity supported, During functional activity, Reliant on assistive device for balance Standing balance-Leahy Scale: Poor Standing balance comment: reliant on external support. pt able to tolerate standing with minimal physical assistance and L knee propped on bed (like knee scooter) for ~  2 mins for peri care                             Pertinent Vitals/Pain Pain Assessment Pain Assessment: Faces Faces  Pain Scale: Hurts little more Pain Location: L foot Pain Descriptors / Indicators: Discomfort, Guarding, Grimacing Pain Intervention(s): Limited activity within patient's tolerance, Monitored during session    Home Living Family/patient expects to be discharged to:: Private residence Living Arrangements: Alone (temporarily living with father) Available Help at Discharge: Family;Available 24 hours/day (father) Type of Home: House Home Access: Stairs to enter Entrance Stairs-Rails: None Entrance Stairs-Number of Steps: 4 large, concrete steps   Home Layout: One level Home Equipment: Agricultural Consultant (2 wheels)      Prior Function Prior Level of Function : Needs assist             Mobility Comments: pt reoprts using RW since recent surgery for all mobility needs ADLs Comments: pt reports her father has been helping her with bathing, toileting, and dressing     Extremity/Trunk Assessment   Upper Extremity Assessment Upper Extremity Assessment: Defer to OT evaluation    Lower Extremity Assessment Lower Extremity Assessment: Generalized weakness    Cervical / Trunk Assessment Cervical / Trunk Assessment: Kyphotic  Communication   Communication Communication: Impaired Factors Affecting Communication: Difficulty expressing self    Cognition Arousal: Alert Behavior During Therapy: Impulsive   PT - Cognitive impairments: Awareness, Attention, Initiation, Sequencing, Problem solving, Safety/Judgement                       PT - Cognition Comments: Pt with significant impulsivity throughout session attempting to mobilize without assistance  despite cueing from therapist. Following commands: Impaired Following commands impaired: Follows one step commands with increased time     Cueing Cueing Techniques: Verbal cues, Visual cues     General Comments General comments (skin integrity, edema, etc.): no signs of acute distress    Exercises     Assessment/Plan     PT Assessment Patient needs continued PT services  PT Problem List Decreased strength;Decreased range of motion;Decreased activity tolerance;Decreased balance;Decreased mobility;Decreased coordination;Decreased cognition;Decreased knowledge of use of DME;Decreased safety awareness;Decreased knowledge of precautions;Pain       PT Treatment Interventions DME instruction;Gait training;Stair training;Functional mobility training;Therapeutic exercise;Therapeutic activities;Balance training;Cognitive remediation;Patient/family education;Wheelchair mobility training;Manual techniques    PT Goals (Current goals can be found in the Care Plan section)  Acute Rehab PT Goals Patient Stated Goal: to get better PT Goal Formulation: With patient Time For Goal Achievement: 06/19/24 Potential to Achieve Goals: Fair    Frequency Min 2X/week     Co-evaluation               AM-PAC PT 6 Clicks Mobility  Outcome Measure Help needed turning from your back to your side while in a flat bed without using bedrails?: A Little Help needed moving from lying on your back to sitting on the side of a flat bed without using bedrails?: A Little Help needed moving to and from a bed to a chair (including a wheelchair)?: A Little Help needed standing up from a chair using your arms (e.g., wheelchair or bedside chair)?: A Little Help needed to walk in hospital room?: Total Help needed climbing 3-5 steps with a railing? : Total 6 Click Score: 14    End of Session Equipment Utilized During Treatment: Gait belt Activity Tolerance: Patient tolerated treatment well;Other (comment) (session  limited secondary to incontinence) Patient left: in bed;with call bell/phone within reach;with bed alarm set;with nursing/sitter in room Nurse Communication: Mobility status;Weight bearing status PT Visit Diagnosis: Unsteadiness on feet (R26.81);Muscle weakness (generalized) (M62.81);Pain Pain - Right/Left: Left Pain - part of  body: Ankle and joints of foot    Time: 1010-1048 PT Time Calculation (min) (ACUTE ONLY): 38 min   Charges:   PT Evaluation $PT Eval Moderate Complexity: 1 Mod PT Treatments $Therapeutic Activity: 8-22 mins PT General Charges $$ ACUTE PT VISIT: 1 Visit         Leontine Hilt DPT Acute Rehab Services [phone] Prefer contact via chat   Leontine NOVAK Wilmarie Sparlin 06/05/2024, 12:49 PM

## 2024-06-05 NOTE — TOC Initial Note (Addendum)
 Transition of Care [facility]) - Initial/Assessment Note    Patient Details  Name: Bianca Yang MRN: [id number] Date of Birth: 08-06-1975  [facility] CM/SW Contact:    [name], LCSWA Phone Number: 06/05/2024, 12:49 PM  Clinical Narrative:  CSW followed up with patient about PT rec for SNF. Patient is agreeable at this time. CSW informed patient that the bed offers may be slim due to patients age and insurance. Patient stated her understanding.   3:08 PM CSW met patient at bedside and provided medicare.gov ratings for accepting SNFs. Patient will review and CSW will follow up to get choice.   CSW will continue to follow.                   Expected Discharge Plan: Skilled Nursing Facility Barriers to Discharge: Continued Medical Work up, SNF Pending bed offer, Insurance Authorization   Patient Goals and CMS Choice Patient states their goals for this hospitalization and ongoing recovery are:: To go to rehab CMS Medicare.gov Compare Post Acute Care list provided to:: Patient Choice offered to / list presented to : Patient [place] ownership interest in [facility].provided to:: Patient    Expected Discharge Plan and Services In-house Referral: Clinical Social Work     Living arrangements for the past 2 months: Single Family Home                                      Prior Living Arrangements/Services Living arrangements for the past 2 months: Single Family Home Lives with:: Self Patient language and need for interpreter reviewed:: Yes Do you feel safe going back to the place where you live?: Yes      Need for Family Participation in Patient Care: No (Comment) Care giver support system in place?: No (comment)   Criminal Activity/Legal Involvement Pertinent to Current Situation/Hospitalization: No - Comment as needed  Activities of Daily Living   ADL Screening (condition at time of admission) Independently performs ADLs?: No Does  the patient have a NEW difficulty with bathing/dressing/toileting/self-feeding that is expected to last >3 days?: Yes (Initiates electronic notice to provider for possible OT consult) Does the patient have a NEW difficulty with getting in/out of bed, walking, or climbing stairs that is expected to last >3 days?: Yes (Initiates electronic notice to provider for possible PT consult) Does the patient have a NEW difficulty with communication that is expected to last >3 days?: No Is the patient deaf or have difficulty hearing?: No Does the patient have difficulty seeing, even when wearing glasses/contacts?: No Does the patient have difficulty concentrating, remembering, or making decisions?: No  Permission Sought/Granted Permission sought to share information with : Facility Industrial/product Designer granted to share information with : Yes, Verbal Permission Granted     Permission granted to share info w AGENCY: SNFs        Emotional Assessment Appearance:: Appears stated age Attitude/Demeanor/Rapport: Engaged Affect (typically observed): Stable Orientation: : Oriented to Self, Oriented to Place, Oriented to  Time, Oriented to Situation Alcohol / Substance Use: Not Applicable Psych Involvement: No (comment)  Admission diagnosis:  Osteomyelitis of left foot (HCC) [M86.9] Cellulitis and abscess of toe of left foot [L03.032, L02.612] Acute osteomyelitis of metatarsal bone of left foot (HCC) [F13.827] Patient Active Problem List   Diagnosis Date Noted   Acute osteomyelitis of metatarsal bone of left foot (HCC) 06/05/2024  Cellulitis and abscess of toe of left foot 06/05/2024   Osteomyelitis of left foot (HCC) 06/04/2024   Hypercapnia 06/04/2024   Cutaneous abscess of left foot 05/04/2024   Diabetic foot infection (HCC) 05/03/2024   Trichomonas vaginitis 01/17/2020   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 [place] [address] Phone: [phone] Fax: [phone]  [facility] [address] [place]), [place] - [address] [place] [address] Phone: [phone] Fax: [phone]  [facility] [address], [address] [place] [address] Phone: [phone] Fax: [phone]  [facility] [address] [place] [address] Phone: [phone] Fax: [phone]     Social Drivers of Health (SDOH) Social History: SDOH Screenings   Food Insecurity: Food Insecurity Present (06/04/2024)  Housing: High Risk (06/04/2024)  Transportation Needs: Unmet Transportation Needs (06/04/2024)  Utilities: At Risk (06/04/2024)  Tobacco Use: Medium Risk (06/04/2024)   SDOH Interventions:     Readmission Risk Interventions     No data to display

## 2024-06-05 NOTE — Consult Note (Signed)
 ORTHOPAEDIC CONSULTATION  REQUESTING PHYSICIAN: Sonjia Held, MD  Chief Complaint: Wound dehiscence left foot fifth ray amputation  HPI: Bianca Yang is a 48 y.o. female who presents with dehiscence of the left foot fifth ray amputation.  Multiple attempts were made immediately postoperatively to inform patient of her cultures and the antibiotics that were called in for her.  Multiple attempts were made to have patient follow-up in the office. Discharged from the hospital on November 11.  Patient presents to the emergency room on December 7 with complaints of pain and inflammation. Past Medical History:  Diagnosis Date   Diabetes mellitus without complication (HCC)    Substance abuse [facility])    Past Surgical History:  Procedure Laterality Date   AMPUTATION Left 05/05/2024   Procedure: AMPUTATION, FOOT, RAY;  Surgeon: Harden Jerona GAILS, MD;  Location: [facility] OR;  Service: Orthopedics;  Laterality: Left;  LEFT FOOT 5TH RAY AMPUTATION   APPLICATION OF WOUND VAC  05/05/2024   Procedure: APPLICATION, WOUND VAC;  Surgeon: Harden Jerona GAILS, MD;  Location: [facility] OR;  Service: Orthopedics;;   TUBAL LIGATION     Social History   Socioeconomic History   Marital status: Single    Spouse name: Not on file   Number of children: Not on file   Years of education: Not on file   Highest education level: Not on file  Occupational History   Not on file  Tobacco Use   Smoking status: Former    Current packs/day: 0.50    Types: Cigarettes   Smokeless tobacco: Never  Substance and Sexual Activity   Alcohol use: Not Currently    Comment: socially   Drug use: No   Sexual activity: Yes  Other Topics Concern   Not on file  Social History Narrative   Not on file   Social Drivers of Health   Financial Resource Strain: Not on file  Food Insecurity: Food Insecurity Present (06/04/2024)   Hunger Vital Sign    Worried About Running Out of Food in the Last Year: Sometimes true    Ran Out of Food in  the Last Year: Sometimes true  Transportation Needs: Unmet Transportation Needs (06/04/2024)   PRAPARE - Administrator, Civil Service (Medical): Yes    Lack of Transportation (Non-Medical): No  Physical Activity: Not on file  Stress: Not on file  Social Connections: Not on file   Family History  Problem Relation Age of Onset   Diabetes Mother    - negative except otherwise stated in the family history section Allergies  Allergen Reactions   Sulfa Antibiotics Other (See Comments)    Unknown/ childhood allergy.    Prior to Admission medications   Medication Sig Start Date End Date Taking? Authorizing Provider  Accu-Chek Softclix Lancets lancets 1 each as directed. Dispense based on patient and insurance preference. Use up to four times daily as directed. (FOR ICD-10 E10.9, E11.9). 05/09/24  Yes Samtani, Jai-Gurmukh, MD  acetaminophen  (TYLENOL ) 500 MG tablet Take 2 tablets (1,000 mg total) by mouth every 6 (six) hours. 05/09/24  Yes Samtani, Jai-Gurmukh, MD  Blood Glucose Monitoring Suppl (BLOOD GLUCOSE MONITOR SYSTEM) w/Device KIT 1 each by Does not apply route as directed. Dispense based on patient and insurance preference. Use up to four times daily as directed. (FOR ICD-10 E10.9, E11.9). 05/09/24  Yes Samtani, Jai-Gurmukh, MD  glimepiride  (AMARYL ) 2 MG tablet Take 1 tablet (2 mg total) by mouth daily with breakfast. 05/10/24  Yes Samtani,  Jai-Gurmukh, MD  Glucose Blood (BLOOD GLUCOSE TEST STRIPS) STRP 1 each by Does not apply route as directed. Dispense based on patient and insurance preference. Use up to four times daily as directed. (FOR ICD-10 E10.9, E11.9). 05/09/24  Yes Samtani, Jai-Gurmukh, MD  Lancet Device MISC 1 each by Does not apply route as directed. Dispense based on patient and insurance preference. Use up to four times daily as directed. (FOR ICD-10 E10.9, E11.9). 05/09/24  Yes Samtani, Jai-Gurmukh, MD  metFORMIN  (GLUCOPHAGE ) 1000 MG tablet Take 1 tablet (1,000  mg total) by mouth 2 (two) times daily with a meal. 05/09/24  Yes Samtani, Jai-Gurmukh, MD  naproxen  (NAPROSYN ) 500 MG tablet Take 1 tablet (500 mg total) by mouth 2 (two) times daily with a meal. 05/09/24  Yes Samtani, Jai-Gurmukh, MD  amoxicillin -clavulanate (AUGMENTIN ) 875-125 MG tablet Take 1 tablet by mouth 2 (two) times daily. Patient not taking: Reported on 06/04/2024 05/10/24   Harden Jerona GAILS, MD  oxyCODONE  (OXY IR/ROXICODONE ) 5 MG immediate release tablet Take 1-2 tablets (5-10 mg total) by mouth every 4 (four) hours as needed for severe pain (pain score 7-10). Patient not taking: Reported on 06/04/2024 05/09/24   Samtani, Jai-Gurmukh, MD  polyethylene glycol powder (GLYCOLAX /MIRALAX ) 17 GM/SCOOP powder Take 17 g by mouth daily as needed for mild constipation. Dissolve 1 capful (17g) in 4-8 ounces of liquid and take by mouth daily. Patient not taking: Reported on 06/04/2024 05/09/24   Samtani, Jai-Gurmukh, MD   CT FOOT LEFT W CONTRAST Result Date: 06/04/2024 EXAM: CT OF THE LEFT FOOT, WITH IV CONTRAST 06/04/2024 05:32:39 PM TECHNIQUE: Axial images were acquired through the left foot with IV contrast. Reformatted images were reviewed. Automated exposure control, iterative reconstruction, and/or weight based adjustment of the mA/kV was utilized to reduce the radiation dose to as low as reasonably achievable. COMPARISON: None available. CLINICAL HISTORY: Pain and inflammation. Prior 5th ray amputation on 05/05/2024. Radiography of 06/04/2024 showed gas in the lateral soft tissues of the foot with lytic destruction of the cuboid and fourth metatarsal. FINDINGS: BONES: Prior 5th ray amputation. Bony demineralization and bony destructive findings in the base of the 4th metatarsal and the cuboid compatible with acute osteomyelitis. Plantar and achilles calcaneal spurs. JOINTS: Gas density tracks into the margin of the articular space between the 4th metatarsal base and the cuboid. No dislocation. The joint  spaces are normal, except for the involvement of the 4th metatarsal-cuboid articulation by osteomyelitis. SOFT TISSUES: Abnormal spicle gas density tracks in the subcutaneous tissues of the left foot. Diffuse subcutaneous edema dorsally and laterally along the foot. This likely reflects cellulitis. Ulceration along the lateral foot especially at the level of the lumbosacral joint. Anterior convexity of the achilles tendon contour compatible with mild distal achilles tendinopathy. IMPRESSION: 1. Acute osteomyelitis involving the base of the fourth metatarsal and the cuboid, with associated gas in the subcutaneous tissues and along the articular margin. 2. Diffuse dorsal and lateral subcutaneous edema of the foot, likely cellulitis. 3. Lateral foot ulceration. 4. Mild distal Achilles tendinopathy with anterior convexity of the Achilles tendon contour. 5. Plantar and Achilles calcaneal spurs. Electronically signed by: Ryan Salvage MD 06/04/2024 05:42 PM EST RP Workstation: [id number]   DG Foot Complete Left Result Date: 06/04/2024 EXAM: 3 VIEW(S) XRAY OF THE LEFT FOOT 06/04/2024 02:39:00 PM COMPARISON: 05/03/2024 status post surgical amputation of the fifth metatarsal and phalanges. CLINICAL HISTORY: diabetic foot wound FINDINGS: BONES AND JOINTS: Status post surgical amputation of the fifth metatarsal and phalanges. Probable lytic  destruction involving the cuboid bone and proximal base of the fourth metatarsal concerning for osteomyelitis. SOFT TISSUES: Large ulceration is seen laterally in the midfoot. IMPRESSION: 1. Large lateral midfoot ulceration with lytic destruction involving the cuboid and proximal base of the fourth metatarsal, suspicious for osteomyelitis. Electronically signed by: Lynwood Seip MD 06/04/2024 02:57 PM EST RP Workstation: [id number]   - pertinent xrays, CT, MRI studies were reviewed and independently interpreted  Positive ROS: All other systems have been reviewed and were otherwise  negative with the exception of those mentioned in the HPI and as above.  Physical Exam: General: Alert, no acute distress Psychiatric: Patient is competent for consent with normal mood and affect Lymphatic: No axillary or cervical lymphadenopathy Cardiovascular: No pedal edema Respiratory: No cyanosis, no use of accessory musculature GI: No organomegaly, abdomen is soft and non-tender    Images:  @ENCIMAGES @  Labs:  Lab Results  Component Value Date   HGBA1C 11.6 (H) 05/04/2024   HGBA1C 12.0 (H) 01/16/2020   HGBA1C 11.4 (H) 11/04/2016   ESRSEDRATE 41 (H) 06/05/2024   ESRSEDRATE 10 11/04/2016   CRP 2.9 (H) 06/05/2024   CRP <0.8 11/04/2016   REPTSTATUS 05/10/2024 FINAL 05/05/2024   GRAMSTAIN  05/05/2024    FEW WBC PRESENT, PREDOMINANTLY PMN FEW GRAM POSITIVE COCCI RARE GRAM NEGATIVE RODS Performed at [facility], [address], [place], [place] [address]    CULT  05/05/2024    RARE METHICILLIN RESISTANT STAPHYLOCOCCUS AUREUS FEW STREPTOCOCCUS CONSTELLATUS MIXED ANAEROBIC FLORA PRESENT.  CALL LAB IF FURTHER IID REQUIRED.    LABORGA METHICILLIN RESISTANT STAPHYLOCOCCUS AUREUS 05/05/2024   LABORGA STREPTOCOCCUS CONSTELLATUS 05/05/2024    Lab Results  Component Value Date   ALBUMIN 2.9 (L) 06/05/2024   ALBUMIN 3.0 (L) 06/04/2024   ALBUMIN 2.2 (L) 05/06/2024   PREALBUMIN 17 (L) 06/05/2024        Latest Ref Rng & Units 06/05/2024    1:10 AM 06/04/2024    4:48 PM 06/04/2024    1:48 PM  CBC EXTENDED  WBC 4.0 - 10.5 K/uL 12.2   12.3   RBC 3.87 - 5.11 MIL/uL 4.43   4.89   Hemoglobin 12.0 - 15.0 g/dL 88.2  86.3  87.1   HCT 36.0 - 46.0 % 38.3  40.0  41.7   Platelets 150 - 400 K/uL 213   240   NEUT# 1.7 - 7.7 K/uL   9.3   Lymph# 0.7 - 4.0 K/uL   1.6     Neurologic: Patient does not have protective sensation bilateral lower extremities.   MUSCULOSKELETAL:   Skin: Examination patient has an ischemic eschar over the lateral incision with wound dehiscence,  maceration, cellulitis.  Patient has a strong palpable dorsalis pedis pulse.  No change in arterial inflow to the ankle.  White cell count 12.2 hemoglobin A1c most recently 11.6 with a recent sed rate of 41 and C-reactive protein of 2.9.  The CT scan shows inflammation involving the base of the fourth metatarsal and cuboid.  There is air in the subcutaneous tissue which is consistent with the open wound and recent surgery.  Assessment: Assessment: Uncontrolled type 2 diabetes with good arterial circulation to the ankle with wound dehiscence and bone changes base of the fourth metatarsal and cuboid.  Plan: Plan: Discussed the importance of nonweightbearing on the left lower extremity.  Will start Vashe dressing changes to the wound.  Will place an order for nitroglycerin  patch to try to help improve the microcirculation.  Continue IV  antibiotics.  Thank you for the consult and the opportunity to see [name], MD [facility] [phone] 7:50 AM

## 2024-06-05 NOTE — ED Notes (Addendum)
 Attempted to call report to take the pt upstairs. Pt's inpatient room currently being sanitized after holding a covid+ patient and is not actually ready per [facility] nurse. Will call back in 30-40 for report and to transport the pt up.

## 2024-06-05 NOTE — Hospital Course (Addendum)
 Bianca Yang is a 48 y.o. female with past medical history significant of DM2, diabetic foot ulcer, OSA, PAD presented to hospital with left foot pain and swelling.  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  In the ED patient was afebrile.  Labs were notable for mild leukocytosis but lactate was within normal range.  BMP showed mild hyponatremia with sodium of 131 with a glucose elevation at 483.  ESR was elevated at 41 with CRP of 2.9.  X-ray of the left foot showed large lateral midfoot ulceration with lytic destruction of the cuboid and proximal base of the fourth metatarsal suspicious osteomyelitis.  CT scan of the foot was then performed which showed acute osteomyelitis involving the fourth metatarsal and cuboid.  Patient was then considered for admission to the hospital for further evaluation and treatment Patient continued on IV antibiotics.  Also noted to be hypercapnic and hypoxic needing HFNC 12/11: Seen by PCCM

## 2024-06-05 NOTE — Inpatient Diabetes Management (Addendum)
 Inpatient Diabetes Program Recommendations  AACE/ADA: New Consensus Statement on Inpatient Glycemic Control (2015)  Target Ranges:  Prepandial:   less than 140 mg/dL      Peak postprandial:   less than 180 mg/dL (1-2 hours)      Critically ill patients:  140 - 180 mg/dL   Lab Results  Component Value Date   GLUCAP 215 (H) 06/05/2024   HGBA1C 11.6 (H) 05/04/2024    Latest Reference Range & Units 06/04/24 20:46 06/04/24 23:22 06/05/24 04:34 06/05/24 06:24  Glucose-Capillary 70 - 99 mg/dL 553 (H) 710 (H) 650 (H) 215 (H)  (H): Data is abnormally high  Diabetes history: DM2 Outpatient Diabetes medications:  Amaryl  2 mg daily Metformin  1 gm bid Current orders for Inpatient glycemic control: Lantus  10 units daily Novolog  0-9 units tid, 0-5 units hs Novolog  0-15 units tid  Inpatient Diabetes Program Recommendations:   Please consider: -Change Lantus  to now and daily -D/C correction scale of 0-15 units tid Ordered Living Well With Diabetes and insulin  pen starter kit  Met with patient @ bedside. Patient does not have a glucose meter and has not been checking CBGs. States she was on insulin  about 8 years ago and stopped taking without a doctor's order. Reviewed A1c of 11.6 and reviewed risks of elevated CBGs. Educated patient on insulin  pen use at home. Reviewed contents of insulin  flexpen starter kit. Reviewed all steps of insulin  pen including attachment of needle, 2-unit air shot, dialing up dose, giving injection, removing needle, disposal of sharps, storage of unused insulin , disposal of insulin  etc. Patient able to provide successful return demonstration. Also reviewed troubleshooting with insulin  pen. MD to give patient Rxs for insulin  pens and insulin  pen needles.   Needs glucose meter on discharge.  Thank you, Nishtha Raider E. Yordy Matton, RN, MSN, CNS, CDCES  Diabetes Coordinator Inpatient Glycemic Control Team Team Pager [phone] (8am-5pm) 06/05/2024 11:44 AM

## 2024-06-05 NOTE — Discharge Instructions (Addendum)
 Take medications as prescribed Get repeat blood work in one week Follow up with pulmonology clinic as scheduled on 07/06/24 Follow up with PCP within one week Follow up with cardiology within 2 weeks  Plate Method for Diabetes   Foods with carbohydrates make your blood glucose level go up. The plate method is a simple way to meal plan and control the amount of carbohydrate you eat.         Use the following guidance to build a healthy plate to control carbohydrates. Divide a 9-inch plate into 3 sections, and consider your beverage the 4th section of your meal: Food Group Examples of Foods/Beverages for This Section of your Meal  Section 1: Non-starchy vegetables Fill  of your plate to include non-starchy vegetables Asparagus, broccoli, brussels sprouts, cabbage, carrots, cauliflower, celery, cucumber, green beans, mushrooms, peppers, salad greens, tomatoes, or zucchini.  Section 2: Protein foods Fill  of your plate to include a lean protein Lean meat, poultry, fish, seafood, cheese, eggs, lean deli meat, tofu, beans, lentils, nuts or nut butters.  Section 3: Carbohydrate foods Fill  of your plate to include carbohydrate foods Whole grains, whole wheat bread, brown rice, whole grain pasta, polenta, corn tortillas, fruit, or starchy vegetables (potatoes, green peas, corn, beans, acorn squash, and butternut squash). One cup of milk also counts as a food that contains carbohydrate.  Section 4: Beverage Choose water or a low-calorie drink for your beverage. Unsweetened tea, coffee, or flavored/sparkling water without added sugar.  Image reprinted with permission from The American Diabetes Association.  Copyright 2022 by the American Diabetes Association.   Copyright 2022  Academy of Nutrition and Dietetics. All rights reserved        Carbohydrate Counting For People With Diabetes  Foods with carbohydrates make your blood glucose level go up. Learning how to count carbohydrates can help  you control your blood glucose levels. First, identify the foods you eat that contain carbohydrates. Then, using the Foods with Carbohydrates chart, determine about how much carbohydrates are in your meals and snacks. Make sure you are eating foods with fiber, protein, and healthy fat along with your carbohydrate foods. Foods with Carbohydrates The following table shows carbohydrate foods that have about 15 grams of carbohydrate each. Using measuring cups, spoons, or a food scale when you first begin learning about carbohydrate counting can help you learn about the portion sizes you typically eat. The following foods have 15 grams carbohydrate each:  Grains 1 slice bread (1 ounce)  1 small tortilla (6-inch size)   large bagel (1 ounce)  1/3 cup pasta or rice (cooked)   hamburger or hot dog bun ( ounce)   cup cooked cereal   to  cup ready-to-eat cereal  2 taco shells (5-inch size) Fruit 1 small fresh fruit ( to 1 cup)   medium banana  17 small grapes (3 ounces)  1 cup melon or berries   cup canned or frozen fruit  2 tablespoons dried fruit (blueberries, cherries, cranberries, raisins)   cup unsweetened fruit juice  Starchy Vegetables  cup cooked beans, peas, corn, potatoes/sweet potatoes   large baked potato (3 ounces)  1 cup acorn or butternut squash  Snack Foods 3 to 6 crackers  8 potato chips or 13 tortilla chips ( ounce to 1 ounce)  3 cups popped popcorn  Dairy 3/4 cup (6 ounces) nonfat plain yogurt, or yogurt with sugar-free sweetener  1 cup milk  1 cup plain rice, soy, coconut or flavored almond milk  Sweets and Desserts  cup ice cream or frozen yogurt  1 tablespoon jam, jelly, pancake syrup, table sugar, or honey  2 tablespoons light pancake syrup  1 inch square of frosted cake or 2 inch square of unfrosted cake  2 small cookies (2/3 ounce each) or  large cookie  Sometimes youll have to estimate carbohydrate amounts if you dont know the exact recipe. One cup  of mixed foods like soups can have 1 to 2 carbohydrate servings, while some casseroles might have 2 or more servings of carbohydrate. Foods that have less than 20 calories in each serving can be counted as free foods. Count 1 cup raw vegetables, or  cup cooked non-starchy vegetables as free foods. If you eat 3 or more servings at one meal, then count them as 1 carbohydrate serving.  Foods without Carbohydrates  Not all foods contain carbohydrates. Meat, some dairy, fats, non-starchy vegetables, and many beverages dont contain carbohydrate. So when you count carbohydrates, you can generally exclude chicken, pork, beef, fish, seafood, eggs, tofu, cheese, butter, sour cream, avocado, nuts, seeds, olives, mayonnaise, water, black coffee, unsweetened tea, and zero-calorie drinks. Vegetables with no or low carbohydrate include green beans, cauliflower, tomatoes, and onions. How much carbohydrate should I eat at each meal?  Carbohydrate counting can help you plan your meals and manage your weight. Following are some starting points for carbohydrate intake at each meal. Work with your registered dietitian nutritionist to find the best range that works for your blood glucose and weight.   To Lose Weight To Maintain Weight  Women 2 - 3 carb servings 3 - 4 carb servings  Men 3 - 4 carb servings 4 - 5 carb servings  Checking your blood glucose after meals will help you know if you need to adjust the timing, type, or number of carbohydrate servings in your meal plan. Achieve and keep a healthy body weight by balancing your food intake and physical activity.  Tips How should I plan my meals?  Plan for half the food on your plate to include non-starchy vegetables, like salad greens, broccoli, or carrots. Try to eat 3 to 5 servings of non-starchy vegetables every day. Have a protein food at each meal. Protein foods include chicken, fish, meat, eggs, or beans (note that beans contain carbohydrate). These two food  groups (non-starchy vegetables and proteins) are low in carbohydrate. If you fill up your plate with these foods, you will eat less carbohydrate but still fill up your stomach. Try to limit your carbohydrate portion to  of the plate.  What fats are healthiest to eat?  Diabetes increases risk for heart disease. To help protect your heart, eat more healthy fats, such as olive oil, nuts, and avocado. Eat less saturated fats like butter, cream, and high-fat meats, like bacon and sausage. Avoid trans fats, which are in all foods that list partially hydrogenated oil as an ingredient. What should I drink?  Choose drinks that are not sweetened with sugar. The healthiest choices are water, carbonated or seltzer waters, and tea and coffee without added sugars.  Sweet drinks will make your blood glucose go up very quickly. One serving of soda or energy drink is  cup. It is best to drink these beverages only if your blood glucose is low.  Artificially sweetened, or diet drinks, typically do not increase your blood glucose if they have zero calories in them. Read labels of beverages, as some diet drinks do have carbohydrate and will raise your blood  glucose. Label Reading Tips Read Nutrition Facts labels to find out how many grams of carbohydrate are in a food you want to eat. Dont forget: sometimes serving sizes on the label arent the same as how much food you are going to eat, so you may need to calculate how much carbohydrate is in the food you are serving yourself.   Carbohydrate Counting for People with Diabetes Sample 1-Day Menu  Breakfast  cup yogurt, low fat, low sugar (1 carbohydrate serving)   cup cereal, ready-to-eat, unsweetened (1 carbohydrate serving)  1 cup strawberries (1 carbohydrate serving)   cup almonds ( carbohydrate serving)  Lunch 1, 5 ounce can chunk light tuna  2 ounces cheese, low fat cheddar  6 whole wheat crackers (1 carbohydrate serving)  1 small apple (1 carbohydrate  servings)   cup carrots ( carbohydrate serving)   cup snap peas  1 cup 1% milk (1 carbohydrate serving)   Evening Meal Stir fry made with: 3 ounces chicken  1 cup brown rice (3 carbohydrate servings)   cup broccoli ( carbohydrate serving)   cup green beans   cup onions  1 tablespoon olive oil  2 tablespoons teriyaki sauce ( carbohydrate serving)  Evening Snack 1 extra small banana (1 carbohydrate serving)  1 tablespoon peanut butter   Carbohydrate Counting for People with Diabetes Vegan Sample 1-Day Menu  Breakfast 1 cup cooked oatmeal (2 carbohydrate servings)   cup blueberries (1 carbohydrate serving)  2 tablespoons flaxseeds  1 cup soymilk fortified with calcium  and vitamin D  1 cup coffee  Lunch 2 slices whole wheat bread (2 carbohydrate servings)   cup baked tofu   cup lettuce  2 slices tomato  2 slices avocado   cup baby carrots ( carbohydrate serving)  1 orange (1 carbohydrate serving)  1 cup soymilk fortified with calcium  and vitamin D   Evening Meal Burrito made with: 1 6-inch corn tortilla (1 carbohydrate serving)  1 cup refried vegetarian beans (2 carbohydrate servings)   cup chopped tomatoes   cup lettuce   cup salsa  1/3 cup brown rice (1 carbohydrate serving)  1 tablespoon olive oil for rice   cup zucchini   Evening Snack 6 small whole grain crackers (1 carbohydrate serving)  2 apricots ( carbohydrate serving)   cup unsalted peanuts ( carbohydrate serving)    Carbohydrate Counting for People with Diabetes Vegetarian (Lacto-Ovo) Sample 1-Day Menu  Breakfast 1 cup cooked oatmeal (2 carbohydrate servings)   cup blueberries (1 carbohydrate serving)  2 tablespoons flaxseeds  1 egg  1 cup 1% milk (1 carbohydrate serving)  1 cup coffee  Lunch 2 slices whole wheat bread (2 carbohydrate servings)  2 ounces low-fat cheese   cup lettuce  2 slices tomato  2 slices avocado   cup baby carrots ( carbohydrate serving)  1 orange (1  carbohydrate serving)  1 cup unsweetened tea  Evening Meal Burrito made with: 1 6-inch corn tortilla (1 carbohydrate serving)   cup refried vegetarian beans (1 carbohydrate serving)   cup tomatoes   cup lettuce   cup salsa  1/3 cup brown rice (1 carbohydrate serving)  1 tablespoon olive oil for rice   cup zucchini  1 cup 1% milk (1 carbohydrate serving)  Evening Snack 6 small whole grain crackers (1 carbohydrate serving)  2 apricots ( carbohydrate serving)   cup unsalted peanuts ( carbohydrate serving)    Copyright 2020  Academy of Nutrition and Dietetics. All rights reserved.  Using Nutrition Labels:  Carbohydrate  Serving Size  Look at the serving size. All the information on the label is based on this portion. Servings Per Container  The number of servings contained in the package. Guidelines for Carbohydrate  Look at the total grams of carbohydrate in the serving size.  1 carbohydrate choice = 15 grams of carbohydrate. Range of Carbohydrate Grams Per Choice  Carbohydrate Grams/Choice Carbohydrate Choices  6-10   11-20 1  21-25 1  26-35 2  36-40 2  41-50 3  51-55 3  56-65 4  66-70 4  71-80 5    Copyright 2020  Academy of Nutrition and Dietetics. All rights reserved.

## 2024-06-06 LAB — VAS US ABI WITH/WO TBI: Right ABI: 1.18

## 2024-06-06 LAB — GLUCOSE, CAPILLARY
Glucose-Capillary: 247 mg/dL — ABNORMAL HIGH (ref 70–99)
Glucose-Capillary: 248 mg/dL — ABNORMAL HIGH (ref 70–99)
Glucose-Capillary: 246 mg/dL — ABNORMAL HIGH (ref 70–99)
Glucose-Capillary: 270 mg/dL — ABNORMAL HIGH (ref 70–99)

## 2024-06-06 MED ORDER — INSULIN GLARGINE 100 UNIT/ML ~~LOC~~ SOLN
12.0000 [IU] | Freq: Every day | SUBCUTANEOUS | Status: DC
  Administered 2024-06-07 – 2024-06-08 (×2): 12 [IU] via SUBCUTANEOUS
  Filled 2024-06-06 (×2): qty 0.12

## 2024-06-06 MED ORDER — DIPHENHYDRAMINE HCL 25 MG PO CAPS
25.0000 mg | ORAL_CAPSULE | Freq: Four times a day (QID) | ORAL | Status: DC | PRN
  Administered 2024-06-06 – 2024-06-23 (×18): 25 mg via ORAL
  Filled 2024-06-06 (×21): qty 1

## 2024-06-06 NOTE — TOC Progression Note (Signed)
 Transition of Care [facility]) - Progression Note    Patient Details  Name: Bianca Yang MRN: [id number] Date of Birth: 02/07/1976  [facility] CM/SW Contact  [name], [place] Phone Number: 06/06/2024, 9:22 AM  Clinical Narrative:   CSW followed up with patient about SNF choice. Patient stated she has not made a decision but will know by Friday. CSW explained to patient that the placement process is proactive and she would need to make a decision sooner than Friday as her EDD is 12/11. Patient expressed her understanding.   CSW will continue to follow.    Expected Discharge Plan: Skilled Nursing Facility Barriers to Discharge: Continued Medical Work up, SNF Pending bed offer, Insurance Authorization               Expected Discharge Plan and Services In-house Referral: Clinical Social Work     Living arrangements for the past 2 months: Single Family Home                                       Social Drivers of Health (SDOH) Interventions SDOH Screenings   Food Insecurity: Food Insecurity Present (06/04/2024)  Housing: High Risk (06/04/2024)  Transportation Needs: Unmet Transportation Needs (06/04/2024)  Utilities: At Risk (06/04/2024)  Tobacco Use: Medium Risk (06/04/2024)    Readmission Risk Interventions     No data to display

## 2024-06-06 NOTE — Inpatient Diabetes Management (Signed)
 Inpatient Diabetes Program Recommendations  AACE/ADA: New Consensus Statement on Inpatient Glycemic Control (2015)  Target Ranges:  Prepandial:   less than 140 mg/dL      Peak postprandial:   less than 180 mg/dL (1-2 hours)      Critically ill patients:  140 - 180 mg/dL   Lab Results  Component Value Date   GLUCAP 247 (H) 06/06/2024   HGBA1C 11.6 (H) 05/04/2024    Latest Reference Range & Units 06/05/24 06:24 06/05/24 12:44 06/05/24 16:39 06/05/24 21:13 06/06/24 06:09  Glucose-Capillary 70 - 99 mg/dL 784 (H) 768 (H) 843 (H) 268 (H) 247 (H)  (H): Data is abnormally high  Diabetes history: DM2 Outpatient Diabetes medications:  Amaryl  2 mg daily Metformin  1 gm bid Current orders for Inpatient glycemic control: Lantus  10 units daily Novolog  0-9 units tid, 0-5 units hs Novolog  0-15 units tid  Inpatient Diabetes Program Recommendations:   Please consider: -Increase Lantus  to 12 units daily  Thank you, Dagoberto E. Oday Ridings, RN, MSN, CNS, CDCES  Diabetes Coordinator Inpatient Glycemic Control Team Team Pager [phone] (8am-5pm) 06/06/2024 10:03 AM

## 2024-06-06 NOTE — Progress Notes (Signed)
 PROGRESS NOTE  Bianca Yang FMW:[id number] DOB: [birth year] DOA: 06/04/2024 PCP: Patient, [name] Per   LOS: 2 days   Brief narrative:  Bianca Yang is a 48 y.o. female with past medical history significant of DM2, diabetic foot ulcer, OSA, PAD presented to hospital with left foot pain and swelling.  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  In the ED, patient was afebrile.  Labs were notable for mild leukocytosis but lactate was within normal range.  BMP showed mild hyponatremia with sodium of 131 with a glucose elevation at 483.  ESR was elevated at 41 with CRP of 2.9.  X-ray of the left foot showed large lateral midfoot ulceration with lytic destruction of the cuboid and proximal base of the fourth metatarsal suspicious osteomyelitis.  CT scan of the foot was then performed which showed acute osteomyelitis involving the fourth metatarsal and cuboid.  Patient was then considered for admission to the hospital for further evaluation and treatment    Assessment/Plan: Principal Problem:   Osteomyelitis of left foot (HCC) Active Problems:   Cocaine abuse (HCC)   Diabetic foot infection (HCC)   Hypercapnia   Acute osteomyelitis of metatarsal bone of left foot (HCC)   Cellulitis and abscess of toe of left foot   Diabetic foot infection with osteomyelitis of the left foot metatarsal, wound dehiscence.. Elevated CRP ESR.  CT scan and x-ray of the foot suggestive of osteomyelitis.   Dr. Harden has seen the patient and advised nonweightbearing of the left lower extremities.  Plan is to continue IV antibiotics for now.  Continue Unasyn  and vancomycin . History of peripheral vascular disease.  Emphasized the need for nonweightbearing on the affected extremities.   DM (diabetes mellitus), type 2 with hyperglycemia. Continue sliding scale, insulin  Accu-Cheks at this time.  Add  long-acting insulin  at nighttime.  Latest hemoglobin A1c of 11.6 a month back will need better glycemic control at home.  Dietitian and diabetic coordinator on board.  Obstructive sleep apnea/OSA Patient with history of sleep apnea not compliant with CPAP.  VBG was obtained in the emergency department and showed evidence of hypercapnia.  Continue CPAP while in the hospital  Social issues.  Patient might need more support at home.  TOC consulted.  DVT prophylaxis: SCDs Start: 06/04/24 1922   Disposition: Home, uncertain plan, depends on Ortho input   Status is: Inpatient Remains inpatient appropriate because: IV antibiotic, pending clinical improvement    Code Status:     Code Status: Full Code  Family Communication: None at bedside  Consultants: Orthopedics Dr Harden  Procedures: None  Anti-infectives:  Vancomycin  and Unasyn  IV  Anti-infectives (From admission, onward)    Start     Dose/Rate Route Frequency Ordered Stop   06/05/24 1700  vancomycin  (VANCOCIN ) 1,750 mg in sodium chloride  0.9 % 500 mL IVPB        1,750 mg 258.8 mL/hr over 120 Minutes Intravenous Every 24 hours 06/04/24 1945     06/04/24 2300  Ampicillin -Sulbactam (UNASYN ) 3 g in sodium chloride  0.9 % 100 mL IVPB        3 g 200 mL/hr over 30 Minutes Intravenous Every 6 hours 06/04/24 1945     06/04/24 1630  Vancomycin  (VANCOCIN ) 1,250 mg in sodium chloride  0.9 % 250 mL IVPB        1,250 mg 166.7 mL/hr over 90  Minutes Intravenous  Once 06/04/24 1622 06/04/24 1924   06/04/24 1630  piperacillin -tazobactam (ZOSYN ) IVPB 3.375 g        3.375 g 100 mL/hr over 30 Minutes Intravenous  Once 06/04/24 1622 06/04/24 1720   06/04/24 1615  vancomycin  (VANCOCIN ) 2,000 mg in sodium chloride  0.9 % 500 mL IVPB  Status:  Discontinued        2,000 mg 260 mL/hr over 120 Minutes Intravenous  Once 06/04/24 1611 06/04/24 1622   06/04/24 1615  piperacillin -tazobactam (ZOSYN ) IVPB 4.5 g  Status:  Discontinued        4.5 g 200  mL/hr over 30 Minutes Intravenous  Once 06/04/24 1611 06/04/24 1622        Subjective: Today, patient was seen and examined at bedside.  Complains of discomfort in the toes and foot.  Has carious tooth.  Objective: Vitals:   06/06/24 0442 06/06/24 0845  BP:    Pulse: 87   Resp: (!) 25   Temp:  99.6 F (37.6 C)  SpO2: 92%    No intake or output data in the 24 hours ending 06/06/24 0911 Filed Weights   06/04/24 1252  Weight: 67 kg   Body mass index is 23.13 kg/m.   Physical Exam: GENERAL: Patient is alert awake and oriented. Not in obvious distress.  Appears chronically ill HENT: No scleral pallor or icterus. Pupils equally reactive to light. Oral mucosa is moist, caries tooth upper. NECK: is supple, no gross swelling noted. CHEST: Clear to auscultation. No crackles or wheezes.  Diminished breath sounds bilaterally. CVS: S1 and S2 heard, no murmur. Regular rate and rhythm.  ABDOMEN: Soft, non-tender, bowel sounds are present. EXTREMITIES: No edema.  Cellulitis of left lateral foot with sutures in place and central area of blackish discoloration, now with dressing.. CNS: Cranial nerves are intact. No focal motor deficits. SKIN: warm and dry, left foot with cellulitis  Data Review: I have personally reviewed the following laboratory data and studies,  CBC: Recent Labs  Lab 06/04/24 1348 06/04/24 1648 06/05/24 0110  WBC 12.3*  --  12.2*  NEUTROABS 9.3*  --   --   HGB 12.8 13.6 11.7*  HCT 41.7 40.0 38.3  MCV 85.3  --  86.5  PLT 240  --  213   Basic Metabolic Panel: Recent Labs  Lab 06/04/24 1348 06/04/24 1648 06/05/24 0110  NA 131* 131* 138  K 4.8 4.8 4.9  CL 88*  --  94*  CO2 35*  --  37*  GLUCOSE 483*  --  153*  BUN 16  --  17  CREATININE 0.58  --  0.67  CALCIUM  9.6  --  9.1  MG  --   --  1.4*  PHOS  --   --  4.2   Liver Function Tests: Recent Labs  Lab 06/04/24 1348 06/05/24 0110  AST 15 13*  ALT 14 12  ALKPHOS 131* 128*  BILITOT 0.3 0.7   PROT 8.5* 8.1  ALBUMIN 3.0* 2.9*   No results for input(s): LIPASE, AMYLASE in the last 168 hours. No results for input(s): AMMONIA in the last 168 hours. Cardiac Enzymes: Recent Labs  Lab 06/05/24 0110  CKTOTAL 26*   BNP (last 3 results) No results for input(s): BNP in the last 8760 hours.  ProBNP (last 3 results) No results for input(s): PROBNP in the last 8760 hours.  CBG: Recent Labs  Lab 06/05/24 0624 06/05/24 1244 06/05/24 1639 06/05/24 2113 06/06/24 0609  GLUCAP 215* 231* 156*  268* 247*   Recent Results (from the past 240 hours)  Blood culture (routine x 2)     Status: None (Preliminary result)   Collection Time: 06/04/24  1:51 PM   Specimen: BLOOD  Result Value Ref Range Status   Specimen Description BLOOD SITE NOT SPECIFIED  Final   Special Requests   Final    BOTTLES DRAWN AEROBIC AND ANAEROBIC Blood Culture adequate volume   Culture   Final    NO GROWTH 2 DAYS Performed at [facility], [address] [place], [place] [address]    Report Status PENDING  Incomplete  Blood culture (routine x 2)     Status: None (Preliminary result)   Collection Time: 06/05/24  1:10 AM   Specimen: BLOOD  Result Value Ref Range Status   Specimen Description BLOOD RIGHT ANTECUBITAL  Final   Special Requests   Final    BOTTLES DRAWN AEROBIC AND ANAEROBIC Blood Culture adequate volume   Culture   Final    NO GROWTH 1 DAY Performed at [facility], [address], [place], [place] [address]    Report Status PENDING  Incomplete     Studies: VAS US  ABI WITH/WO TBI Result Date: 06/06/2024  LOWER EXTREMITY DOPPLER STUDY Patient Name:  Bianca Yang  Date of Exam:   06/05/2024 Medical Rec #: [id number]          Accession #:    [id number] Date of Birth: [birth year]          Patient Gender: F Patient Age:   21 years Exam Location:  [facility] Procedure:      VAS US  ABI WITH/WO TBI Referring Phys: ANASTASSIA DOUTOVA  --------------------------------------------------------------------------------  Indications: Ulceration. High Risk Factors: Diabetes, past history of smoking.  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 Comparison Study: Previous study on 11.6.2025. Performing Technologist: Edilia Elden Appl  Examination Guidelines: A complete evaluation includes at minimum, Doppler waveform signals and systolic blood pressure reading at the level of bilateral brachial, anterior tibial, and posterior tibial arteries, when vessel segments are accessible. Bilateral testing is considered an integral part of a complete examination. Photoelectric Plethysmograph (PPG) waveforms and toe systolic pressure readings are included as required and additional duplex testing as needed. Limited examinations for reoccurring indications may be performed as noted.  ABI Findings: +---------+------------------+-----+---------+--------+ Right    Rt Pressure (mmHg)IndexWaveform Comment  +---------+------------------+-----+---------+--------+ Brachial 106                    triphasic         +---------+------------------+-----+---------+--------+ PTA      142               1.18 triphasic         +---------+------------------+-----+---------+--------+ DP       123               1.02 triphasic         +---------+------------------+-----+---------+--------+ Great Toe97                0.81                   +---------+------------------+-----+---------+--------+ +---------+------------------+-----+---------+-------+ Left     Lt Pressure (mmHg)IndexWaveform Comment +---------+------------------+-----+---------+-------+ Brachial 120                    triphasic        +---------+------------------+-----+---------+-------+ Burnetta Skeeter  0.74                  +---------+------------------+-----+---------+-------+ +-------+-----------+-----------+------------+------------+  ABI/TBIToday's ABIToday's TBIPrevious ABIPrevious TBI +-------+-----------+-----------+------------+------------+ Right  1.18       0.81                                +-------+-----------+-----------+------------+------------+ Left   Unotained. 0.74                                +-------+-----------+-----------+------------+------------+  Limited exam due to patient declining to proceed with cuff pressures. Waveforms of the left lower extremity documented.  Summary: Right: Resting right ankle-brachial index is within normal range. The right toe-brachial index is normal.  Left: The left toe-brachial index is normal.  Left ABI unobtained, waveforms documented. *See table(s) above for measurements and observations.  Electronically signed by Gaile New MD on 06/06/2024 at 7:26:28 AM.    Final    CT FOOT LEFT W CONTRAST Result Date: 06/04/2024 EXAM: CT OF THE LEFT FOOT, WITH IV CONTRAST 06/04/2024 05:32:39 PM TECHNIQUE: Axial images were acquired through the left foot with IV contrast. Reformatted images were reviewed. Automated exposure control, iterative reconstruction, and/or weight based adjustment of the mA/kV was utilized to reduce the radiation dose to as low as reasonably achievable. COMPARISON: None available. CLINICAL HISTORY: Pain and inflammation. Prior 5th ray amputation on 05/05/2024. Radiography of 06/04/2024 showed gas in the lateral soft tissues of the foot with lytic destruction of the cuboid and fourth metatarsal. FINDINGS: BONES: Prior 5th ray amputation. Bony demineralization and bony destructive findings in the base of the 4th metatarsal and the cuboid compatible with acute osteomyelitis. Plantar and achilles calcaneal spurs. JOINTS: Gas density tracks into the margin of the articular space between the 4th metatarsal base and the cuboid. No dislocation. The joint spaces are normal, except for the involvement of the 4th metatarsal-cuboid articulation by osteomyelitis. SOFT  TISSUES: Abnormal spicle gas density tracks in the subcutaneous tissues of the left foot. Diffuse subcutaneous edema dorsally and laterally along the foot. This likely reflects cellulitis. Ulceration along the lateral foot especially at the level of the lumbosacral joint. Anterior convexity of the achilles tendon contour compatible with mild distal achilles tendinopathy. IMPRESSION: 1. Acute osteomyelitis involving the base of the fourth metatarsal and the cuboid, with associated gas in the subcutaneous tissues and along the articular margin. 2. Diffuse dorsal and lateral subcutaneous edema of the foot, likely cellulitis. 3. Lateral foot ulceration. 4. Mild distal Achilles tendinopathy with anterior convexity of the Achilles tendon contour. 5. Plantar and Achilles calcaneal spurs. Electronically signed by: Ryan Salvage MD 06/04/2024 05:42 PM EST RP Workstation: [id number]   DG Foot Complete Left Result Date: 06/04/2024 EXAM: 3 VIEW(S) XRAY OF THE LEFT FOOT 06/04/2024 02:39:00 PM COMPARISON: 05/03/2024 status post surgical amputation of the fifth metatarsal and phalanges. CLINICAL HISTORY: diabetic foot wound FINDINGS: BONES AND JOINTS: Status post surgical amputation of the fifth metatarsal and phalanges. Probable lytic destruction involving the cuboid bone and proximal base of the fourth metatarsal concerning for osteomyelitis. SOFT TISSUES: Large ulceration is seen laterally in the midfoot. IMPRESSION: 1. Large lateral midfoot ulceration with lytic destruction involving the cuboid and proximal base of the fourth metatarsal, suspicious for osteomyelitis. Electronically signed by: Lynwood Seip MD 06/04/2024 02:57 PM EST RP Workstation: [id number]      Vernal Alstrom, MD  [facility]  06/06/2024  If 7PM-7AM, please contact night-coverage

## 2024-06-06 NOTE — Plan of Care (Signed)
  Problem: Education: Goal: Ability to describe self-care measures that may prevent or decrease complications (Diabetes Survival Skills Education) will improve Outcome: Progressing Goal: Individualized Educational Video(s) Outcome: Progressing   Problem: Coping: Goal: Ability to adjust to condition or change in health will improve Outcome: Progressing   Problem: Fluid Volume: Goal: Ability to maintain a balanced intake and output will improve Outcome: Progressing   Problem: Health Behavior/Discharge Planning: Goal: Ability to identify and utilize available resources and services will improve Outcome: Progressing Goal: Ability to manage health-related needs will improve Outcome: Progressing   Problem: Nutritional: Goal: Maintenance of adequate nutrition will improve Outcome: Progressing Goal: Progress toward achieving an optimal weight will improve Outcome: Progressing   Problem: Metabolic: Goal: Ability to maintain appropriate glucose levels will improve Outcome: Progressing

## 2024-06-07 LAB — CBC WITH DIFFERENTIAL/PLATELET
Abs Immature Granulocytes: 0.09 K/uL — ABNORMAL HIGH (ref 0.00–0.07)
Basophils Absolute: 0.1 K/uL (ref 0.0–0.1)
Basophils Relative: 0 %
Eosinophils Absolute: 0.2 K/uL (ref 0.0–0.5)
Eosinophils Relative: 1 %
HCT: 32.8 % — ABNORMAL LOW (ref 36.0–46.0)
Hemoglobin: 9.7 g/dL — ABNORMAL LOW (ref 12.0–15.0)
Immature Granulocytes: 1 %
Lymphocytes Relative: 17 %
Lymphs Abs: 2.4 K/uL (ref 0.7–4.0)
MCH: 25.7 pg — ABNORMAL LOW (ref 26.0–34.0)
MCHC: 29.6 g/dL — ABNORMAL LOW (ref 30.0–36.0)
MCV: 86.8 fL (ref 80.0–100.0)
Monocytes Absolute: 1 K/uL (ref 0.1–1.0)
Monocytes Relative: 7 %
Neutro Abs: 10.5 K/uL — ABNORMAL HIGH (ref 1.7–7.7)
Neutrophils Relative %: 74 %
Platelets: 193 K/uL (ref 150–400)
RBC: 3.78 MIL/uL — ABNORMAL LOW (ref 3.87–5.11)
RDW: 13.4 % (ref 11.5–15.5)
WBC: 14.2 K/uL — ABNORMAL HIGH (ref 4.0–10.5)
nRBC: 0 % (ref 0.0–0.2)

## 2024-06-07 LAB — GLUCOSE, CAPILLARY
Glucose-Capillary: 315 mg/dL — ABNORMAL HIGH (ref 70–99)
Glucose-Capillary: 281 mg/dL — ABNORMAL HIGH (ref 70–99)
Glucose-Capillary: 270 mg/dL — ABNORMAL HIGH (ref 70–99)
Glucose-Capillary: 313 mg/dL — ABNORMAL HIGH (ref 70–99)

## 2024-06-07 LAB — BASIC METABOLIC PANEL WITH GFR
Anion gap: 6 (ref 5–15)
BUN: 28 mg/dL — ABNORMAL HIGH (ref 6–20)
CO2: 37 mmol/L — ABNORMAL HIGH (ref 22–32)
Calcium: 8.3 mg/dL — ABNORMAL LOW (ref 8.9–10.3)
Chloride: 90 mmol/L — ABNORMAL LOW (ref 98–111)
Creatinine, Ser: 0.72 mg/dL (ref 0.44–1.00)
GFR, Estimated: >60 mL/min (ref >60)
Glucose, Bld: 298 mg/dL — ABNORMAL HIGH (ref 70–99)
Potassium: 4.4 mmol/L (ref 3.5–5.1)
Sodium: 133 mmol/L — ABNORMAL LOW (ref 135–145)

## 2024-06-07 MED ORDER — IBUPROFEN 200 MG PO TABS
400.0000 mg | ORAL_TABLET | Freq: Four times a day (QID) | ORAL | Status: DC | PRN
  Administered 2024-06-07 – 2024-06-22 (×3): 400 mg via ORAL
  Filled 2024-06-07 (×3): qty 2

## 2024-06-07 MED ORDER — PANTOPRAZOLE SODIUM 40 MG PO TBEC
40.0000 mg | DELAYED_RELEASE_TABLET | Freq: Every day | ORAL | Status: DC
  Administered 2024-06-07 – 2024-06-23 (×17): 40 mg via ORAL
  Filled 2024-06-07 (×17): qty 1

## 2024-06-07 NOTE — Progress Notes (Signed)
 PROGRESS NOTE  Bianca Yang FMW:[id number] DOB: [birth year] DOA: 06/04/2024 PCP: Patient, [name] Per   LOS: 3 days   Brief narrative:  Bianca Yang is a 48 y.o. female with past medical history significant for diabetes mellitus type 2, diabetic foot ulcer, OSA, PAD presented to hospital with left foot pain and swelling.  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  In the ED, patient was afebrile.  Labs were notable for mild leukocytosis but lactate was within normal range.  BMP showed mild hyponatremia with sodium of 131 with a glucose elevation at 483.  ESR was elevated at 41 with CRP of 2.9.  X-ray of the left foot showed large lateral midfoot ulceration with lytic destruction of the cuboid and proximal base of the fourth metatarsal suspicious osteomyelitis.  CT scan of the foot was then performed which showed acute osteomyelitis involving the fourth metatarsal and cuboid.  Patient was then considered for admission to the hospital for further evaluation and treatment. During hospitalization patient was seen by orthopedics.  At this time Dr Harden recommends IV antibiotics, nonweightbearing and outpatient follow-up.  PT has recommended skilled nursing facility placement.  Assessment/Plan: Principal Problem:   Osteomyelitis of left foot (HCC) Active Problems:   Cocaine abuse (HCC)   Diabetic foot infection (HCC)   Hypercapnia   Acute osteomyelitis of metatarsal bone of left foot (HCC)   Cellulitis and abscess of toe of left foot   Diabetic foot infection with question osteomyelitis of the left foot metatarsal, wound dehiscence.. Elevated CRP ESR.  CT scan and x-ray of the foot suggestive of osteomyelitis.   Dr. Harden has seen the patient and advised nonweightbearing of the left lower extremities and he states that there is bone marrow edema with no plans for  surgical intervention at this time.  He recommends a couple more days of IV antibiotic and transition to oral antibiotic on discharge to follow-up as outpatient with nonweightbearing.. Continue Unasyn  and vancomycin .  Patient was again emphasized the need for nonweightbearing on the affected extremities.  Had a fall today while trying to go to the bathroom.  Blood cultures on admission negative in 2 days.  Patient has had fever of 102 F.  Will continue to monitor temperature curve.  WBC trending at 14.2 from 12.2.   DM (diabetes mellitus), type 2 with hyperglycemia. On long-acting sliding scale insulin .  Latest hemoglobin A1c of 11.6 a month back will need better glycemic control at home.  Dietitian and diabetic coordinator on board.  Patient has high chances of noncompliance with diet and medication.  Obstructive sleep apnea/OSA Patient with history of sleep apnea not compliant with CPAP.  VBG was obtained in the emergency department and showed evidence of hypercapnia.  Continue CPAP while in the hospital  Social issues.  Patient might need more support at home.  Has been consulted.  DVT prophylaxis: SCDs Start: 06/04/24 1922   Disposition: Skilled nursing facility as per PT evaluation   Status is: Inpatient Remains inpatient appropriate because: IV antibiotic, pending clinical improvement, need for rehabilitation.    Code Status:     Code Status: Full Code  Family Communication: Spoke with the patient's father at bedside  Consultants: Orthopedics Dr Harden  Procedures: None  Anti-infectives:  Vancomycin  and Unasyn  IV  Anti-infectives (From admission, onward)    Start  Dose/Rate Route Frequency Ordered Stop   06/05/24 1700  vancomycin  (VANCOCIN ) 1,750 mg in sodium chloride  0.9 % 500 mL IVPB        1,750 mg 258.8 mL/hr over 120 Minutes Intravenous Every 24 hours 06/04/24 1945     06/04/24 2300  Ampicillin -Sulbactam (UNASYN ) 3 g in sodium chloride  0.9 % 100 mL IVPB        3  g 200 mL/hr over 30 Minutes Intravenous Every 6 hours 06/04/24 1945     06/04/24 1630  Vancomycin  (VANCOCIN ) 1,250 mg in sodium chloride  0.9 % 250 mL IVPB        1,250 mg 166.7 mL/hr over 90 Minutes Intravenous  Once 06/04/24 1622 06/04/24 1924   06/04/24 1630  piperacillin -tazobactam (ZOSYN ) IVPB 3.375 g        3.375 g 100 mL/hr over 30 Minutes Intravenous  Once 06/04/24 1622 06/04/24 1720   06/04/24 1615  vancomycin  (VANCOCIN ) 2,000 mg in sodium chloride  0.9 % 500 mL IVPB  Status:  Discontinued        2,000 mg 260 mL/hr over 120 Minutes Intravenous  Once 06/04/24 1611 06/04/24 1622   06/04/24 1615  piperacillin -tazobactam (ZOSYN ) IVPB 4.5 g  Status:  Discontinued        4.5 g 200 mL/hr over 30 Minutes Intravenous  Once 06/04/24 1611 06/04/24 1622        Subjective: Today, patient was seen and examined at bedside.  Nursing staff reported that patient had a fall today while trying to go to the bathroom on her own.  Discussed the importance of nonweightbearing and supervision/assistance.  Patient's father at bedside.  Denies any nausea vomiting shortness of breath but had temperature spike of 102.9 F.  Objective: Vitals:   06/07/24 0723 06/07/24 0900  BP: 131/66   Pulse:  87  Resp:    Temp: (!) 101.2 F (38.4 C) 98 F (36.7 C)  SpO2:  93%    Intake/Output Summary (Last 24 hours) at 06/07/2024 1015 Last data filed at 06/07/2024 0900 Gross per 24 hour  Intake 1592.35 ml  Output 1350 ml  Net 242.35 ml   Filed Weights   06/04/24 1252  Weight: 67 kg   Body mass index is 23.13 kg/m.   Physical Exam:  General:  Average built, not in obvious distress, appears chronically ill HENT:   No scleral pallor or icterus noted. Oral mucosa is moist.  Poor dental hygiene, caries of the upper tooth. Chest:  Clear breath sounds.  Diminished breath sounds bilaterally. No crackles or wheezes.  CVS: S1 &S2 heard. No murmur.  Regular rate and rhythm. Abdomen: Soft, nontender,  nondistended.  Bowel sounds are heard.   Extremities: No cyanosis, clubbing with left foot under dressing. Psych: Alert, awake and oriented, normal mood CNS:  No cranial nerve deficits.  Power equal in all extremities.   Skin: Warm and dry.  Left foot dressing.  Data Review: I have personally reviewed the following laboratory data and studies,  CBC: Recent Labs  Lab 06/04/24 1348 06/04/24 1648 06/05/24 0110 06/07/24 0218  WBC 12.3*  --  12.2* 14.2*  NEUTROABS 9.3*  --   --  10.5*  HGB 12.8 13.6 11.7* 9.7*  HCT 41.7 40.0 38.3 32.8*  MCV 85.3  --  86.5 86.8  PLT 240  --  213 193   Basic Metabolic Panel: Recent Labs  Lab 06/04/24 1348 06/04/24 1648 06/05/24 0110 06/07/24 0218  NA 131* 131* 138 133*  K 4.8 4.8 4.9 4.4  CL 88*  --  94* 90*  CO2 35*  --  37* 37*  GLUCOSE 483*  --  153* 298*  BUN 16  --  17 28*  CREATININE 0.58  --  0.67 0.72  CALCIUM  9.6  --  9.1 8.3*  MG  --   --  1.4*  --   PHOS  --   --  4.2  --    Liver Function Tests: Recent Labs  Lab 06/04/24 1348 06/05/24 0110  AST 15 13*  ALT 14 12  ALKPHOS 131* 128*  BILITOT 0.3 0.7  PROT 8.5* 8.1  ALBUMIN 3.0* 2.9*   No results for input(s): LIPASE, AMYLASE in the last 168 hours. No results for input(s): AMMONIA in the last 168 hours. Cardiac Enzymes: Recent Labs  Lab 06/05/24 0110  CKTOTAL 26*   BNP (last 3 results) No results for input(s): BNP in the last 8760 hours.  ProBNP (last 3 results) No results for input(s): PROBNP in the last 8760 hours.  CBG: Recent Labs  Lab 06/06/24 0609 06/06/24 1147 06/06/24 1636 06/06/24 2135 06/07/24 0545  GLUCAP 247* 248* 246* 270* 315*   Recent Results (from the past 240 hours)  Blood culture (routine x 2)     Status: None (Preliminary result)   Collection Time: 06/04/24  1:51 PM   Specimen: BLOOD  Result Value Ref Range Status   Specimen Description BLOOD SITE NOT SPECIFIED  Final   Special Requests   Final    BOTTLES DRAWN AEROBIC  AND ANAEROBIC Blood Culture adequate volume   Culture   Final    NO GROWTH 3 DAYS Performed at [facility], [address], [place], [place] [address]    Report Status PENDING  Incomplete  Blood culture (routine x 2)     Status: None (Preliminary result)   Collection Time: 06/05/24  1:10 AM   Specimen: BLOOD  Result Value Ref Range Status   Specimen Description BLOOD RIGHT ANTECUBITAL  Final   Special Requests   Final    BOTTLES DRAWN AEROBIC AND ANAEROBIC Blood Culture adequate volume   Culture   Final    NO GROWTH 2 DAYS Performed at [facility], [address] [place], [place] [address]    Report Status PENDING  Incomplete     Studies: VAS US  ABI WITH/WO TBI Result Date: 06/06/2024  LOWER EXTREMITY DOPPLER STUDY Patient Name:  Bianca Yang  Date of Exam:   06/05/2024 Medical Rec #: [id number]          Accession #:    [id number] Date of Birth: 1976-03-03          Patient Gender: F Patient Age:   43 years Exam Location:  [facility] Procedure:      VAS US  ABI WITH/WO TBI Referring Phys: ANASTASSIA DOUTOVA --------------------------------------------------------------------------------  Indications: Ulceration. High Risk Factors: Diabetes, past history of smoking.  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 Comparison Study: Previous study on 11.6.2025. Performing Technologist: Edilia Elden Appl  Examination Guidelines: A complete evaluation includes at minimum, Doppler waveform signals and systolic blood pressure reading at the level of bilateral brachial, anterior tibial, and posterior tibial arteries, when vessel segments are accessible. Bilateral testing is considered an integral part of a complete examination. Photoelectric Plethysmograph (PPG) waveforms and toe systolic pressure readings are included as required and additional duplex testing as needed. Limited examinations for reoccurring indications may be performed as noted.  ABI Findings:  +---------+------------------+-----+---------+--------+ Right  Rt Pressure (mmHg)IndexWaveform Comment  +---------+------------------+-----+---------+--------+ Brachial 106                    triphasic         +---------+------------------+-----+---------+--------+ PTA      142               1.18 triphasic         +---------+------------------+-----+---------+--------+ DP       123               1.02 triphasic         +---------+------------------+-----+---------+--------+ Great Toe97                0.81                   +---------+------------------+-----+---------+--------+ +---------+------------------+-----+---------+-------+ Left     Lt Pressure (mmHg)IndexWaveform Comment +---------+------------------+-----+---------+-------+ Brachial 120                    triphasic        +---------+------------------+-----+---------+-------+ Great Toe89                0.74                  +---------+------------------+-----+---------+-------+ +-------+-----------+-----------+------------+------------+ ABI/TBIToday's ABIToday's TBIPrevious ABIPrevious TBI +-------+-----------+-----------+------------+------------+ Right  1.18       0.81                                +-------+-----------+-----------+------------+------------+ Left   Unotained. 0.74                                +-------+-----------+-----------+------------+------------+  Limited exam due to patient declining to proceed with cuff pressures. Waveforms of the left lower extremity documented.  Summary: Right: Resting right ankle-brachial index is within normal range. The right toe-brachial index is normal.  Left: The left toe-brachial index is normal.  Left ABI unobtained, waveforms documented. *See table(s) above for measurements and observations.  Electronically signed by Gaile New MD on 06/06/2024 at 7:26:28 AM.    Final       Vernal Alstrom, MD  [facility] 06/07/2024   If 7PM-7AM, please contact night-coverage

## 2024-06-07 NOTE — TOC Progression Note (Addendum)
 Transition of Care [facility]) - Progression Note    Patient Details  Name: Bianca Yang MRN: [id number] Date of Birth: 1976-04-06  Transition of [facility]) CM/SW Contact  [name], [place] Phone Number: 06/07/2024, 9:53 AM  Clinical Narrative:   CSW followed up with patient on bed choice for SNF. Patient stated she is trying to see how her hospitalization goes before choosing a facility. CSW explained that the SNF process is to be proactive so things are in place prior to patient discharging. Patient asked her family member at bedside which facility she should go to. Per the family member, they would like to look at facilities closer to patients address. CSW informed that due to patient current insurance, the current accepting facilities are the only one that extended a bed offer and are in network with her insurance. Patient stated she will have a decision made by end of day today.   3:11 PM CSW attempted to speak with patient about bed choice. Patient stated for CSW to speak with her at a later time.   4:07 PM CSW spoke with patient about SNF choice. Patient chose [facility]. CSW to notify facility and inform facility to start auth.   CSW will continue to follow.     Expected Discharge Plan: Skilled Nursing Facility Barriers to Discharge: Continued Medical Work up, SNF Pending bed offer, Insurance Authorization               Expected Discharge Plan and Services In-house Referral: Clinical Social Work     Living arrangements for the past 2 months: Single Family Home                                       Social Drivers of Health (SDOH) Interventions SDOH Screenings   Food Insecurity: Food Insecurity Present (06/04/2024)  Housing: High Risk (06/04/2024)  Transportation Needs: Unmet Transportation Needs (06/04/2024)  Utilities: At Risk (06/04/2024)  Tobacco Use: Medium Risk (06/04/2024)    Readmission Risk Interventions     No data to display

## 2024-06-07 NOTE — Progress Notes (Signed)
 Pt. Attempting to transfer to the bedside commode this am and slid to the floor. Pt. Denied hitting her head and denied LOC. On assessment, pt. Was noted to be restless and emotional, but without visible injury. On call MD, for TRH paged and notified of event. Safety precautions reinforced.

## 2024-06-07 NOTE — Progress Notes (Signed)
 Mobility Specialist Progress Note:    06/07/24 1157  Mobility  Activity Stood at bedside;Dangled on edge of bed (STS, Ankle Pump, Heel Raise, Leg ext)  Level of Assistance Contact guard assist, steadying assist  Assistive Device Other (Comment) (HHA)  Range of Motion/Exercises Active;Active Assistive  LLE Weight Bearing Per Provider Order NWB  Activity Response Tolerated fair  Mobility Referral Yes  Mobility visit 1 Mobility  Mobility Specialist Start Time (ACUTE ONLY) 1157  Mobility Specialist Stop Time (ACUTE ONLY) 1205  Mobility Specialist Time Calculation (min) (ACUTE ONLY) 8 min   Pt sitting in bed agreeable to session. No c/o any symptoms. Pt able to transition to EOB w/o assist. Pt able to stand w/ light CGA using HHA. Pt performed movements w/o difficulty. Left pt on EOB w/ all needs met.   Bianca Yang Mobility Specialist Please Neurosurgeon or Rehab Office at [phone]

## 2024-06-07 NOTE — Progress Notes (Signed)
 Patient has had a temp off and on all morning. She has a temp again of 101.6. Can not give tylenol  until 1335. Notified MD.

## 2024-06-07 NOTE — Progress Notes (Addendum)
 Can patient's left foot dressing per orders.

## 2024-06-07 NOTE — Plan of Care (Signed)
   Problem: Activity: Goal: Risk for activity intolerance will decrease Outcome: Progressing   Problem: Coping: Goal: Level of anxiety will decrease Outcome: Progressing

## 2024-06-07 NOTE — Progress Notes (Signed)
 Physical Therapy Treatment Patient Details Name: Bianca Yang MRN: [id number] DOB: 02/16/1976 Today's Date: 06/07/2024   History of Present Illness Pt is a 48 y.o. F presenting to [facility] on 06/04/24 with L foot pain and swelling following L foot 5th ray amputation on 05/05/24. PMH is significant for DM, cocaine abuse.    PT Comments  Pt is currently presenting at Mod I for bed mobility, Min A to CGA for transfers and CGA to Mod A for gait due to significant difficulty maintaining NWB precautions and impulsivity related to activities and safety awareness. Pt placing heel down frequently during activity and trying to ambulate to nurses station despite poor adherence to WB precautions. Due to pt current functional status, home set up and available assistance at home recommending skilled physical therapy services < 3 hours/day in order to address strength, balance and functional mobility to decrease risk for falls, injury, immobility, skin break down and re-hospitalization.     If plan is discharge home, recommend the following: A lot of help with walking and/or transfers;A lot of help with bathing/dressing/bathroom;Assistance with cooking/housework;Direct supervision/assist for medications management;Direct supervision/assist for financial management;Assist for transportation;Help with stairs or ramp for entrance   Can travel by private vehicle     No  Equipment Recommendations  Wheelchair (measurements PT);Wheelchair cushion (measurements PT)       Precautions / Restrictions Precautions Precautions: Fall Recall of Precautions/Restrictions: Impaired Restrictions Weight Bearing Restrictions Per Provider Order: No LLE Weight Bearing Per Provider Order: Non weight bearing Other Position/Activity Restrictions: Pt with extreme difficulty maintaining NWB status; WB through heel despite heavy education     Mobility  Bed Mobility Overal bed mobility: Modified Independent Bed Mobility: Supine  to Sit, Sit to Supine     Supine to sit: Modified independent (Device/Increase time) Sit to supine: Modified independent (Device/Increase time)   General bed mobility comments: Increased time to complete    Transfers Overall transfer level: Needs assistance Equipment used: Rolling walker (2 wheels) Transfers: Sit to/from Stand Sit to Stand: Contact guard assist           General transfer comment: CGA with RW, decreased safety awareness, impulsive, cueing for slowing down during transfers; does moderate with NWB precautions with sit to stand. More difficulty with mobility    Ambulation/Gait Ambulation/Gait assistance: Min assist, Contact guard assist, Mod assist Gait Distance (Feet): 100 Feet Assistive device: Rolling walker (2 wheels) Gait Pattern/deviations: Trunk flexed, Step-to pattern Gait velocity: decreased Gait velocity interpretation: <1.31 ft/sec, indicative of household ambulator   General Gait Details: Hop to gait pattern intermittently in order to maintain NWB precuations. Difficulty with NWB precautions placing heel on ground; pt determined to try to ambulate to nurses station despite difficulty maitnaining WB precautions and pleas for pt to return to EOB. Less WB through the L heel with increased distance requiring increased assist. Pt educated on WB precautions and states understanding including  They told me no matter what to not put weight throught his foot.    Balance Overall balance assessment: Needs assistance Sitting-balance support: No upper extremity supported, Feet supported Sitting balance-Leahy Scale: Good Sitting balance - Comments: seated EOB   Standing balance support: Bilateral upper extremity supported, During functional activity, Reliant on assistive device for balance Standing balance-Leahy Scale: Poor Standing balance comment: reliant on RW for support, NWB to LLE (difficulty maintaining)        Communication  Communication Communication: No apparent difficulties  Cognition Arousal: Alert Behavior During Therapy: Impulsive, Restless   PT -  Cognitive impairments: Awareness, Attention, Initiation, Sequencing, Problem solving, Safety/Judgement     PT - Cognition Comments: Pt with significant impulsivity throughout session attempting to mobilize without assistance or RW despite cueing from therapist. Difficulty maintaining NWB precaution and not returning to bed when experiencing difficulty maintaining NWB precaution Following commands: Impaired Following commands impaired: Follows one step commands with increased time, Follows multi-step commands inconsistently    Cueing Cueing Techniques: Verbal cues     General Comments General comments (skin integrity, edema, etc.): O2 sats down to 89% on 15L with HFNC during activity. no increase drainage noted at bandage at end of session      Pertinent Vitals/Pain Pain Assessment Pain Assessment: Faces Faces Pain Scale: Hurts even more Pain Location: L foot Pain Descriptors / Indicators: Discomfort, Guarding, Grimacing Pain Intervention(s): Monitored during session     PT Goals (current goals can now be found in the care plan section) Acute Rehab PT Goals Patient Stated Goal: to get better PT Goal Formulation: With patient Time For Goal Achievement: 06/19/24 Potential to Achieve Goals: Fair Progress towards PT goals: Progressing toward goals    Frequency    Min 2X/week      PT Plan  Continue with current POC        AM-PAC PT 6 Clicks Mobility   Outcome Measure  Help needed turning from your back to your side while in a flat bed without using bedrails?: A Little Help needed moving from lying on your back to sitting on the side of a flat bed without using bedrails?: A Little Help needed moving to and from a bed to a chair (including a wheelchair)?: A Little Help needed standing up from a chair using your arms (e.g., wheelchair or  bedside chair)?: A Little Help needed to walk in hospital room?: Total (while maintaining WB precautions) Help needed climbing 3-5 steps with a railing? : Total 6 Click Score: 14    End of Session Equipment Utilized During Treatment: Gait belt Activity Tolerance: Patient tolerated treatment well Patient left: in bed;with call bell/phone within reach;with bed alarm set Nurse Communication: Mobility status;Weight bearing status;Other (comment) (pt difficulty maintaining WB status) PT Visit Diagnosis: Unsteadiness on feet (R26.81);Muscle weakness (generalized) (M62.81);Pain Pain - Right/Left: Left Pain - part of body: Ankle and joints of foot     Time: 1321-1402 PT Time Calculation (min) (ACUTE ONLY): 41 min  Charges:    $Therapeutic Activity: 38-52 mins PT General Charges $$ ACUTE PT VISIT: 1 Visit                     Dorothyann Maier, DPT, CLT  Acute Rehabilitation Services Office: [phone] (Secure chat preferred)    Dorothyann VEAR Maier 06/07/2024, 3:15 PM

## 2024-06-08 ENCOUNTER — Inpatient Hospital Stay (HOSPITAL_COMMUNITY)

## 2024-06-08 DIAGNOSIS — J189 Pneumonia, unspecified organism: Secondary | ICD-10-CM

## 2024-06-08 DIAGNOSIS — Z87891 Personal history of nicotine dependence: Secondary | ICD-10-CM

## 2024-06-08 DIAGNOSIS — G4733 Obstructive sleep apnea (adult) (pediatric): Secondary | ICD-10-CM

## 2024-06-08 DIAGNOSIS — I270 Primary pulmonary hypertension: Secondary | ICD-10-CM | POA: Diagnosis not present

## 2024-06-08 DIAGNOSIS — J9601 Acute respiratory failure with hypoxia: Secondary | ICD-10-CM | POA: Diagnosis not present

## 2024-06-08 DIAGNOSIS — R400 Somnolence: Secondary | ICD-10-CM

## 2024-06-08 LAB — RAPID URINE DRUG SCREEN, HOSP PERFORMED
Amphetamines: NOT DETECTED
Barbiturates: NOT DETECTED
Benzodiazepines: NOT DETECTED
Cocaine: NOT DETECTED
Opiates: POSITIVE — AB
Tetrahydrocannabinol: NOT DETECTED

## 2024-06-08 LAB — BASIC METABOLIC PANEL WITH GFR
Anion gap: 5 (ref 5–15)
BUN: 22 mg/dL — ABNORMAL HIGH (ref 6–20)
CO2: 38 mmol/L — ABNORMAL HIGH (ref 22–32)
Calcium: 8.2 mg/dL — ABNORMAL LOW (ref 8.9–10.3)
Chloride: 88 mmol/L — ABNORMAL LOW (ref 98–111)
Creatinine, Ser: 0.44 mg/dL (ref 0.44–1.00)
GFR, Estimated: >60 mL/min (ref >60)
Glucose, Bld: 339 mg/dL — ABNORMAL HIGH (ref 70–99)
Potassium: 4.5 mmol/L (ref 3.5–5.1)
Sodium: 131 mmol/L — ABNORMAL LOW (ref 135–145)

## 2024-06-08 LAB — BLOOD GAS, ARTERIAL
Acid-Base Excess: 12 mmol/L — ABNORMAL HIGH (ref 0.0–2.0)
Bicarbonate: 40.2 mmol/L — ABNORMAL HIGH (ref 20.0–28.0)
O2 Saturation: 90.3 %
Patient temperature: 37
pCO2 arterial: 68 mmHg (ref 32–48)
pH, Arterial: 7.38 (ref 7.35–7.45)
pO2, Arterial: 60 mmHg — ABNORMAL LOW (ref 83–108)

## 2024-06-08 LAB — CBC WITH DIFFERENTIAL/PLATELET
Abs Immature Granulocytes: 0.13 K/uL — ABNORMAL HIGH (ref 0.00–0.07)
Basophils Absolute: 0.1 K/uL (ref 0.0–0.1)
Basophils Relative: 0 %
Eosinophils Absolute: 0.5 K/uL (ref 0.0–0.5)
Eosinophils Relative: 3 %
HCT: 30.4 % — ABNORMAL LOW (ref 36.0–46.0)
Hemoglobin: 9.3 g/dL — ABNORMAL LOW (ref 12.0–15.0)
Immature Granulocytes: 1 %
Lymphocytes Relative: 12 %
Lymphs Abs: 1.6 K/uL (ref 0.7–4.0)
MCH: 27.3 pg (ref 26.0–34.0)
MCHC: 30.6 g/dL (ref 30.0–36.0)
MCV: 89.1 fL (ref 80.0–100.0)
Monocytes Absolute: 1 K/uL (ref 0.1–1.0)
Monocytes Relative: 8 %
Neutro Abs: 10.2 K/uL — ABNORMAL HIGH (ref 1.7–7.7)
Neutrophils Relative %: 76 %
Platelets: 193 K/uL (ref 150–400)
RBC: 3.41 MIL/uL — ABNORMAL LOW (ref 3.87–5.11)
RDW: 13.4 % (ref 11.5–15.5)
WBC: 13.4 K/uL — ABNORMAL HIGH (ref 4.0–10.5)
nRBC: 0 % (ref 0.0–0.2)

## 2024-06-08 LAB — ECHOCARDIOGRAM COMPLETE
Area-P 1/2: 5.13 cm2
Height: 67 in
Single Plane A4C EF: 46.1 %
Weight: 2363.33 [oz_av]

## 2024-06-08 LAB — MAGNESIUM: Magnesium: 1.7 mg/dL (ref 1.7–2.4)

## 2024-06-08 LAB — GLUCOSE, CAPILLARY
Glucose-Capillary: 338 mg/dL — ABNORMAL HIGH (ref 70–99)
Glucose-Capillary: 166 mg/dL — ABNORMAL HIGH (ref 70–99)
Glucose-Capillary: 127 mg/dL — ABNORMAL HIGH (ref 70–99)
Glucose-Capillary: 92 mg/dL (ref 70–99)

## 2024-06-08 LAB — BRAIN NATRIURETIC PEPTIDE: B Natriuretic Peptide: 208.7 pg/mL — ABNORMAL HIGH (ref 0.0–100.0)

## 2024-06-08 MED ORDER — SODIUM CHLORIDE 0.9 % IV SOLN
500.0000 mg | INTRAVENOUS | Status: AC
  Administered 2024-06-08 – 2024-06-10 (×3): 500 mg via INTRAVENOUS
  Filled 2024-06-08 (×3): qty 5

## 2024-06-08 MED ORDER — ENOXAPARIN SODIUM 40 MG/0.4ML IJ SOSY
40.0000 mg | PREFILLED_SYRINGE | Freq: Every day | INTRAMUSCULAR | Status: DC
  Administered 2024-06-08 – 2024-06-23 (×16): 40 mg via SUBCUTANEOUS
  Filled 2024-06-08 (×16): qty 0.4

## 2024-06-08 MED ORDER — LORAZEPAM 0.5 MG PO TABS: 0.5000 mg | ORAL_TABLET | Freq: Once | ORAL | Status: DC | PRN

## 2024-06-08 MED ORDER — INSULIN GLARGINE 100 UNIT/ML ~~LOC~~ SOLN
15.0000 [IU] | Freq: Every day | SUBCUTANEOUS | Status: DC
  Administered 2024-06-09 – 2024-06-13 (×5): 15 [IU] via SUBCUTANEOUS
  Filled 2024-06-08 (×6): qty 0.15

## 2024-06-08 MED ORDER — INSULIN ASPART 100 UNIT/ML IJ SOLN
5.0000 [IU] | Freq: Three times a day (TID) | INTRAMUSCULAR | Status: DC
  Administered 2024-06-08 – 2024-06-14 (×16): 5 [IU] via SUBCUTANEOUS
  Filled 2024-06-08: qty 5
  Filled 2024-06-08: qty 1
  Filled 2024-06-08 (×12): qty 5

## 2024-06-08 NOTE — TOC Progression Note (Addendum)
 Transition of Care [facility]) - Progression Note    Patient Details  Name: Bianca Yang MRN: [id number] Date of Birth: 20-Apr-1976  Transition of Care [facility]) CM/SW Contact  [name], [place] Phone Number: 06/08/2024, 10:12 AM  Clinical Narrative:   Bedside RN informed RNCM and CSW that patient and family inquired about applying for emergency disability. CSW reached out to [employer] with Financial Counseling.   [facility] starting insurance auth for patient.   1:06 PM [facility] rescinded bed offer at this time. CSW informed patient that facility rescinded bed offer. Patient redirected CSW to speak with her father, [name]. CSW informed [name] that the only current bed offers are [facility] [place] and [facility] in [place]. [name] stated either facility is fine. CSW informed that CSW cannot pick facility for patient. Family understood and is okay with whichever facility can accept patient first. [name] provided his number for CSW to call him directly [phone] .   However, CSW followed up with the regional director, Brittany, for the other two bed offers in [place] ([facility]) and they have rescinded their offer at this time. CSW followed up with [facility] in [place]. Facility is not in network with patients insurance.   1:46 PM CSW reached out to [place] facilities that are in network with other [place] Medicaid advantage plans. CSW found that [facility] and [facility] are in network with [facility] as well. CSW spoke with admissions coordinators [place] ([facility]) and Leontine Carnella) to ask them to review patient referral. Facilities to review.   3:01 PM Patient currently has no bed offers.   CSW will continue to follow.    Expected Discharge Plan: Skilled Nursing Facility Barriers to Discharge: Continued Medical Work up, SNF Pending bed offer, Insurance Authorization               Expected Discharge Plan and Services In-house Referral:  Clinical Social Work     Living arrangements for the past 2 months: Single Family Home                                       Social Drivers of Health (SDOH) Interventions SDOH Screenings   Food Insecurity: Food Insecurity Present (06/04/2024)  Housing: High Risk (06/04/2024)  Transportation Needs: Unmet Transportation Needs (06/04/2024)  Utilities: At Risk (06/04/2024)  Tobacco Use: Medium Risk (06/04/2024)    Readmission Risk Interventions     No data to display

## 2024-06-08 NOTE — Progress Notes (Signed)
ABG collected and sent to lab. Lab called and notified.

## 2024-06-08 NOTE — Progress Notes (Signed)
 Mobility Specialist Progress Note:    06/08/24 1000  Mobility  Activity Dangled on edge of bed;Stood at bedside (STS, heel raise, leg ext)  Level of Assistance Contact guard assist, steadying assist  Assistive Device Other (Comment) (HHA)  Range of Motion/Exercises Active Assistive;Active  LLE Weight Bearing Per Provider Order NWB  Activity Response Tolerated fair  Mobility Referral Yes  Mobility visit 1 Mobility  Mobility Specialist Start Time (ACUTE ONLY) 1000  Mobility Specialist Stop Time (ACUTE ONLY) 1008  Mobility Specialist Time Calculation (min) (ACUTE ONLY) 8 min   Received pt laying in bed a little reluctant but agreeable to session similar to yesterday. C/o being tired and fatigued but otherwise well. Pt able to transfer to EOB w/ little to no assist. Pt needing CGA-HHA to perform several stands from bed. Pt able to perform exercises. Returned pt to bed w/ all needs met.   Venetia Keel Mobility Specialist Please Neurosurgeon or Rehab Office at [phone]

## 2024-06-08 NOTE — Progress Notes (Signed)
 Echocardiogram 2D Echocardiogram has been performed.  Juliene JINNY Rucks 06/08/2024, 4:04 PM

## 2024-06-08 NOTE — Consult Note (Signed)
 NAME:  Bianca Yang, MRN:  [id number], DOB:  1975/08/26, LOS: 4 ADMISSION DATE:  06/04/2024, CONSULTATION DATE:  06/08/2024 REFERRING MD:  Christobal Guadalajara, MD, CHIEF COMPLAINT:  respiratory failure   History of Present Illness:  Bianca Yang is a 48 y.o. woman with past medical history of type 2 diabetes and chronic diabetic foot wounds, OSA not on CPAP, and tobacco use disorder smoking most recently a month ago.  She is here after having a recent fifth ray amputation with Dr. Harden.  She did not take her antibiotics.  She did not have her sutures removed.  She presents with fevers worsening pain and drainage from the ulcers.  She has acute osteomyelitis of the left foot.  She is on IV antibiotics.  She is having fevers.  This morning she was more encephalopathic and hypoxemic.  Chest x-ray is obtained which shows multifocal pneumonia.  She was started on azithromycin  in addition to the Unasyn  she is already on for osteomyelitis.  PCCM was consulted for increased oxygen apartment.  On my exam the patient is somnolent, snoring, she arouses and I was able to wean her down from 10LNC to [facility]. History if very limited due to her lack of cooperation with the interview.   Pertinent  Medical History  Type 2 DM  History of tobacco use GERD  Significant Hospital Events: Including procedures, antibiotic start and stop dates in addition to other pertinent events     Interim History / Subjective:    Objective    Blood pressure 121/75, pulse 93, temperature 98.3 F (36.8 C), temperature source Oral, resp. rate 18, height 5' 7 (1.702 m), weight 67 kg, SpO2 98%.        Intake/Output Summary (Last 24 hours) at 06/08/2024 1128 Last data filed at 06/08/2024 0459 Gross per 24 hour  Intake 1529.91 ml  Output 600 ml  Net 929.91 ml   Filed Weights   06/04/24 1252  Weight: 67 kg    Examination: General: somnolent, chronically ill appearing, slumped over HENT: on nasal cannula, audibly snoring  while sleeping Lungs: diminished, clear, no wheeze, no increased work of breathing Cardiovascular: tachycardic, regular Abdomen: obese, soft Extremities: left foot is wrapped in dressing Neuro: somnolent, arousable to voice, intermittently follows commands and goes back to sleep, moves all 4 extremities.   Resolved problem list   Assessment and Plan   Acute hypoxemic respiratory failure CAP History of tobacco use disorder Somnolence OSA not on CPAP  I suspect some of her hypoxemic is driven by untreated OSA, as well as her hypoventilation from her somnolence. Agree with cap coverage as ordered unasyn /azithromycin . No indication for steroids gien no overt wheezing. OOB, IS.   Please call  back if she does not continue to improve with treatment of her underlying infection.    Verdon Gore, MD Pulmonary and Critical Care Medicine [facility] 06/08/2024 4:43 PM Pager: see AMION  If no response to pager, please call critical care on call (see AMION) until 7pm After 7:00 pm call Elink      Labs   CBC: Recent Labs  Lab 06/04/24 1348 06/04/24 1648 06/05/24 0110 06/07/24 0218 06/08/24 0218  WBC 12.3*  --  12.2* 14.2* 13.4*  NEUTROABS 9.3*  --   --  10.5* 10.2*  HGB 12.8 13.6 11.7* 9.7* 9.3*  HCT 41.7 40.0 38.3 32.8* 30.4*  MCV 85.3  --  86.5 86.8 89.1  PLT 240  --  213 193 193    Basic  Metabolic Panel: Recent Labs  Lab 06/04/24 1348 06/04/24 1648 06/05/24 0110 06/07/24 0218 06/08/24 0218  NA 131* 131* 138 133* 131*  K 4.8 4.8 4.9 4.4 4.5  CL 88*  --  94* 90* 88*  CO2 35*  --  37* 37* 38*  GLUCOSE 483*  --  153* 298* 339*  BUN 16  --  17 28* 22*  CREATININE 0.58  --  0.67 0.72 0.44  CALCIUM  9.6  --  9.1 8.3* 8.2*  MG  --   --  1.4*  --  1.7  PHOS  --   --  4.2  --   --    GFR: Estimated Creatinine Clearance: 82.7 mL/min (by C-G formula based on SCr of 0.44 mg/dL). Recent Labs  Lab 06/04/24 1348 06/04/24 1409 06/04/24 2118 06/05/24 0110  06/07/24 0218 06/08/24 0218  WBC 12.3*  --   --  12.2* 14.2* 13.4*  LATICACIDVEN  --  1.6 1.6  --   --   --     Liver Function Tests: Recent Labs  Lab 06/04/24 1348 06/05/24 0110  AST 15 13*  ALT 14 12  ALKPHOS 131* 128*  BILITOT 0.3 0.7  PROT 8.5* 8.1  ALBUMIN 3.0* 2.9*   No results for input(s): LIPASE, AMYLASE in the last 168 hours. No results for input(s): AMMONIA in the last 168 hours.  ABG    Component Value Date/Time   PHART 7.38 06/08/2024 0902   PCO2ART 68 (HH) 06/08/2024 0902   PO2ART 60 (L) 06/08/2024 0902   HCO3 40.2 (H) 06/08/2024 0902   TCO2 41 (H) 06/04/2024 1648   O2SAT 90.3 06/08/2024 0902     Coagulation Profile: No results for input(s): INR, PROTIME in the last 168 hours.  Cardiac Enzymes: Recent Labs  Lab 06/05/24 0110  CKTOTAL 26*    HbA1C: Hgb A1c MFr Bld  Date/Time Value Ref Range Status  05/04/2024 03:21 AM 11.6 (H) 4.8 - 5.6 % Final    Comment:    (NOTE) Diagnosis of Diabetes The following HbA1c ranges recommended by the American Diabetes Association (ADA) may be used as an aid in the diagnosis of diabetes mellitus.  Hemoglobin             Suggested A1C NGSP%              Diagnosis  <5.7                   Non Diabetic  5.7-6.4                Pre-Diabetic  >6.4                   Diabetic  <7.0                   Glycemic control for                       adults with diabetes.    01/16/2020 06:10 AM 12.0 (H) 4.8 - 5.6 % Final    Comment:    (NOTE) Pre diabetes:          5.7%-6.4%  Diabetes:              >6.4%  Glycemic control for   <7.0% adults with diabetes     CBG: Recent Labs  Lab 06/07/24 0545 06/07/24 1143 06/07/24 1634 06/07/24 2140 06/08/24 0605  GLUCAP 315* 281* 270* 313* 338*    Review  of Systems:   Limited due to patient condition.   Past Medical History:  She,  has a past medical history of Diabetes mellitus without complication (HCC) and Substance abuse (HCC).   Surgical  History:   Past Surgical History:  Procedure Laterality Date   AMPUTATION Left 05/05/2024   Procedure: AMPUTATION, FOOT, RAY;  Surgeon: Harden Jerona GAILS, MD;  Location: [facility] OR;  Service: Orthopedics;  Laterality: Left;  LEFT FOOT 5TH RAY AMPUTATION   APPLICATION OF WOUND VAC  05/05/2024   Procedure: APPLICATION, WOUND VAC;  Surgeon: Harden Jerona GAILS, MD;  Location: [facility];  Service: Orthopedics;;   TUBAL LIGATION       Social History:   reports that she has quit smoking. Her smoking use included cigarettes. She has never used smokeless tobacco. She reports that she does not currently use alcohol. She reports that she does not use drugs.   Family History:  Her family history includes Diabetes in her mother.   Allergies Allergies[1]   Home Medications  Prior to Admission medications  Medication Sig Start Date End Date Taking? Authorizing Provider  Accu-Chek Softclix Lancets lancets 1 each as directed. Dispense based on patient and insurance preference. Use up to four times daily as directed. (FOR ICD-10 E10.9, E11.9). 05/09/24  Yes Samtani, Jai-Gurmukh, MD  acetaminophen  (TYLENOL ) 500 MG tablet Take 2 tablets (1,000 mg total) by mouth every 6 (six) hours. 05/09/24  Yes Samtani, Jai-Gurmukh, MD  Blood Glucose Monitoring Suppl (BLOOD GLUCOSE MONITOR SYSTEM) w/Device KIT 1 each by Does not apply route as directed. Dispense based on patient and insurance preference. Use up to four times daily as directed. (FOR ICD-10 E10.9, E11.9). 05/09/24  Yes Samtani, Jai-Gurmukh, MD  glimepiride  (AMARYL ) 2 MG tablet Take 1 tablet (2 mg total) by mouth daily with breakfast. 05/10/24  Yes Samtani, Jai-Gurmukh, MD  Glucose Blood (BLOOD GLUCOSE TEST STRIPS) STRP 1 each by Does not apply route as directed. Dispense based on patient and insurance preference. Use up to four times daily as directed. (FOR ICD-10 E10.9, E11.9). 05/09/24  Yes Samtani, Jai-Gurmukh, MD  Lancet Device MISC 1 each by Does not apply route as  directed. Dispense based on patient and insurance preference. Use up to four times daily as directed. (FOR ICD-10 E10.9, E11.9). 05/09/24  Yes Samtani, Jai-Gurmukh, MD  metFORMIN  (GLUCOPHAGE ) 1000 MG tablet Take 1 tablet (1,000 mg total) by mouth 2 (two) times daily with a meal. 05/09/24  Yes Samtani, Jai-Gurmukh, MD  naproxen  (NAPROSYN ) 500 MG tablet Take 1 tablet (500 mg total) by mouth 2 (two) times daily with a meal. 05/09/24  Yes Samtani, Jai-Gurmukh, MD  amoxicillin -clavulanate (AUGMENTIN ) 875-125 MG tablet Take 1 tablet by mouth 2 (two) times daily. Patient not taking: Reported on 06/04/2024 05/10/24   Harden Jerona GAILS, MD  oxyCODONE  (OXY IR/ROXICODONE ) 5 MG immediate release tablet Take 1-2 tablets (5-10 mg total) by mouth every 4 (four) hours as needed for severe pain (pain score 7-10). Patient not taking: Reported on 06/04/2024 05/09/24   Samtani, Jai-Gurmukh, MD  polyethylene glycol powder (GLYCOLAX /MIRALAX ) 17 GM/SCOOP powder Take 17 g by mouth daily as needed for mild constipation. Dissolve 1 capful (17g) in 4-8 ounces of liquid and take by mouth daily. Patient not taking: Reported on 06/04/2024 05/09/24   Samtani, Jai-Gurmukh, MD               [1]  Allergies Allergen Reactions   Sulfa Antibiotics Other (See Comments)    Unknown/ childhood allergy.

## 2024-06-08 NOTE — Inpatient Diabetes Management (Signed)
 Inpatient Diabetes Program Recommendations  AACE/ADA: New Consensus Statement on Inpatient Glycemic Control (2015)  Target Ranges:  Prepandial:   less than 140 mg/dL      Peak postprandial:   less than 180 mg/dL (1-2 hours)      Critically ill patients:  140 - 180 mg/dL   Lab Results  Component Value Date   GLUCAP 338 (H) 06/08/2024   HGBA1C 11.6 (H) 05/04/2024    Review of Glycemic Control  Diabetes history: DM2 Outpatient Diabetes medications:  Amaryl  2 mg daily Metformin  1 gm bid Current orders for Inpatient glycemic control: Lantus  12 units daily Novolog  0-9 units tid, 0-5 units hs Novolog  0-15 units tid   Inpatient Diabetes Program Recommendations:   Please consider: -Increase Lantus  to 15 units daily   -Add Novolog  5 units tid meal coverage if eats 50%   Thank you, Dagoberto E. Kiron Osmun, RN, MSN, CNS, CDCES  Diabetes Coordinator Inpatient Glycemic Control Team Team Pager [phone] (8am-5pm) 06/08/2024 10:04 AM

## 2024-06-08 NOTE — Progress Notes (Signed)
 Patient ID: Bianca Yang, female   DOB: Jan 11, 1976, 48 y.o.   MRN: [id number] Patient is seen in follow-up for ischemic ulceration left foot fifth ray amputation.  Patient was sitting up at bedside with nausea and vomiting could not examine her foot.  Will reevaluate later.

## 2024-06-08 NOTE — Progress Notes (Signed)
 OT Cancellation Note  Patient Details Name: Bianca Yang MRN: [id number] DOB: [birth year]   Cancelled Treatment:    Reason Eval/Treat Not Completed: (P) Pt sister in room, reports Pt feels hot and has been in distress for at least 1 hour since she has been there. Pt conversational at times, confused, very restless, rolling L/R, from stomach to back, in/out of sleep, snoring at times, jolts awake and is confused wondering where she is or who is in the room with her. This cycle repeated for several minutes, Pt unable to report any symptoms but is clearly unwell. RN informed.   Elouise JONELLE Bott 06/08/2024, 12:36 PM

## 2024-06-08 NOTE — Plan of Care (Signed)
 Patient ID: [name], female   DOB: [birth year], 48 y.o.   MRN: [id number]  Problem: Education: Goal: Ability to describe self-care measures that may prevent or decrease complications (Diabetes Survival Skills Education) will improve Outcome: Progressing Goal: Individualized Educational Video(s) Outcome: Progressing   Problem: Coping: Goal: Ability to adjust to condition or change in health will improve Outcome: Progressing   Problem: Fluid Volume: Goal: Ability to maintain a balanced intake and output will improve Outcome: Progressing   Problem: Health Behavior/Discharge Planning: Goal: Ability to identify and utilize available resources and services will improve Outcome: Progressing Goal: Ability to manage health-related needs will improve Outcome: Progressing   Problem: Metabolic: Goal: Ability to maintain appropriate glucose levels will improve Outcome: Progressing   Problem: Nutritional: Goal: Maintenance of adequate nutrition will improve Outcome: Progressing Goal: Progress toward achieving an optimal weight will improve Outcome: Progressing   Problem: Skin Integrity: Goal: Risk for impaired skin integrity will decrease Outcome: Progressing   Problem: Tissue Perfusion: Goal: Adequacy of tissue perfusion will improve Outcome: Progressing   Problem: Education: Goal: Knowledge of General Education information will improve Description: Including pain rating scale, medication(s)/side effects and non-pharmacologic comfort measures Outcome: Progressing   Problem: Health Behavior/Discharge Planning: Goal: Ability to manage health-related needs will improve Outcome: Progressing   Problem: Clinical Measurements: Goal: Ability to maintain clinical measurements within normal limits will improve Outcome: Progressing Goal: Will remain free from infection Outcome: Progressing Goal: Diagnostic test results will improve Outcome: Progressing Goal: Respiratory  complications will improve Outcome: Progressing Goal: Cardiovascular complication will be avoided Outcome: Progressing   Problem: Nutrition: Goal: Adequate nutrition will be maintained Outcome: Progressing   Problem: Coping: Goal: Level of anxiety will decrease Outcome: Progressing   Problem: Elimination: Goal: Will not experience complications related to bowel motility Outcome: Progressing Goal: Will not experience complications related to urinary retention Outcome: Progressing   Problem: Pain Managment: Goal: General experience of comfort will improve and/or be controlled Outcome: Progressing   Problem: Safety: Goal: Ability to remain free from injury will improve Outcome: Progressing   Problem: Skin Integrity: Goal: Risk for impaired skin integrity will decrease Outcome: Progressing    Bianca JONETTA Collier, RN

## 2024-06-08 NOTE — Progress Notes (Signed)
 PROGRESS NOTE Bianca Yang  FMW:[id number] DOB: 12/01/1975 DOA: 06/04/2024 PCP: Patient, [name] Per  Brief Narrative/Hospital Course: Bianca Yang is a 48 y.o. female with past medical history significant of DM2, diabetic foot ulcer, OSA, PAD presented to hospital with left foot pain and swelling.  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  In the ED patient was afebrile.  Labs were notable for mild leukocytosis but lactate was within normal range.  BMP showed mild hyponatremia with sodium of 131 with a glucose elevation at 483.  ESR was elevated at 41 with CRP of 2.9.  X-ray of the left foot showed large lateral midfoot ulceration with lytic destruction of the cuboid and proximal base of the fourth metatarsal suspicious osteomyelitis.  CT scan of the foot was then performed which showed acute osteomyelitis involving the fourth metatarsal and cuboid.  Patient was then considered for admission to the hospital for further evaluation and treatment  Subjective: Seen and examined today Father in room  She is aa has been removing [place] and spo2 in 85%, placed in back on [place], appears bit cofised but got some pain meds Overnight remains afebrile had temperature spike 101.2 yesterday 7:23 AM, on 15 L oxygen, Labs hyperglycemic 300 stable renal function CBC with mild leukocytosis and stable anemia   Assessment and plan:  Diabetic foot infection with question osteomyelitis of the left foot metatarsal, wound dehiscence: Inflammation marker elevated CRP ESR.  Imaging CT scan and x-ray of the foot suggestive of osteomyelitis.  ABI normal from 12/8. Dr. Harden advised [facility] LLE- stated there is bone marrow edema with no plans for surgical intervention at this time. Patient remains on broad-spectrum antibiotics and hopefully transition to p.o. and arrange outpatient follow-up w/ [facility] Continue  blood culture NGTD since admission, remains on Unasyn  and vancomycin  Patient has had fever of 102 F 12/10, afebrile since and WBC improving Had a fall 12/10 while trying to go to the bathroom.  Encouraged and educated on nonweightbearing status.  Reported obstructive sleep apnea/OSA Acute on chronic hypoxia and hypercapnea-noncompliant to CPAP: Patient with history of sleep apnea (?) not compliant with CPAP. VBG in the ED-pH 7.38 pCO2 68 pO2 52 showed evidence of hypercapnia. Encourage use of CPAP machine.  Patient on high flow nasal cannula during nighttime and also noted to be on afternoon per RN- whenever patient gets upset/anxious her oxygen drops.  Not visibly short of breath.  Bicarb chronically elevated on BMP Checked CXR and ABG this am showing hypercapnea and hypoxia and compensated PH. Requested [facility] evaluation, continue antibiotics.  Check BNP and echocardiogram.  MultifocaL Pneumonia in CXR: CXR THIS AM  Diffuse interstitial lung opacity with clustered micro nodularity in the right mid lung and right base as well as left lower lobe. Imaging features suggest multifocal pneumonia  She is already on vancomycin  unasyn , add azithromycin .I will consult PCCm as she is now on 15L [place] She smoked and quit a month ago. Less than pack a day x 28 yrs   DM type 2 with uncontrolled hyperglycemia: Latest hemoglobin A1c of 11.6 a month. Up lantus  to 15u a, cont ssi and add premela 5 u tid if eating > 50% of meal  Patient has high chances of noncompliance with diet and medication. Recent Labs  Lab 06/07/24 0545 06/07/24 1143 06/07/24 1634 06/07/24 2140 06/08/24 0605  GLUCAP 315*  281* 270* 313* 338*    Hyponatremia: Monitor likely from hyperglycemia  Social issues: Patient might need more support at home. toc consulted.  Hypomagnesemia: Improved  Hypoalbuminemia: Augment diet RD following  History of cocaine abuse: Cessation advise,help with resources  Normocytic anemia: Some  drop in hemoglobin holding stable and angiogram although on admission in 12. Check anemia panel Recent Labs    06/04/24 1348 06/04/24 1648 06/05/24 0110 06/07/24 0218 06/08/24 0218  HGB 12.8 13.6 11.7* 9.7* 9.3*  MCV 85.3  --  86.5 86.8 89.1   Substance abuse  Hx of crack cocaine use, + a month ago- check UDS AGAIN, Patient appears much older than her stated age.  She is residing at her father's house who is taking care of  Mobility: PT Orders: Active PT Follow up Rec: Skilled Nursing-Short Term Rehab (<3 Hours/Day)06/07/2024 1510   DVT prophylaxis: enoxaparin  (LOVENOX ) injection 40 mg Start: 06/08/24 1145 SCDs Start: 06/08/24 1046 SCDs Start: 06/04/24 1922  Code Status:   Code Status: Full Code Family Communication: plan of care discussed with patient at bedside. Patient status is: Remains hospitalized because of severity of illness Level of care: Progressive   Dispo: The patient is from: HOME            Anticipated disposition: TBD TO SNF.  Patient is agreeable for SNF Objective: Vitals last 24 hrs: Vitals:   06/07/24 1522 06/07/24 1945 06/08/24 0320 06/08/24 0810  BP: (!) 108/59 119/62 (!) 119/58 121/75  Pulse: 78 74 86 93  Resp: 19 20 19 18   Temp: 98.6 F (37 C) 98.3 F (36.8 C) 98.3 F (36.8 C)   TempSrc:  Oral Oral   SpO2: (!) 71% 100% 97% 98%  Weight:      Height:        Physical Examination: General exam: alert awake, oriented  HEENT:Oral mucosa moist, Ear/Nose WNL grossly Respiratory system: Bilaterally clear BS,no use of accessory muscle Cardiovascular system: S1 & S2 +, No JVD. Gastrointestinal system: Abdomen soft,NT,ND, BS+ Nervous System: Alert, awake, moving all extremities,and following commands. Extremities: extremities warm, leg edema neg Rt foot in dressing+ Skin: Warm, no rashes MSK: Normal muscle bulk,tone, power   Medications reviewed:  Scheduled Meds:  (feeding supplement) PROSource Plus  30 mL Oral BID WC   enoxaparin  (LOVENOX )  injection  40 mg Subcutaneous Q24H   feeding supplement (GLUCERNA SHAKE)  237 mL Oral TID BM   insulin  aspart  0-15 Units Subcutaneous TID WC   insulin  aspart  0-5 Units Subcutaneous QHS   insulin  aspart  5 Units Subcutaneous TID WC   [START ON 06/09/2024] insulin  glargine  15 Units Subcutaneous Daily   magnesium  oxide  400 mg Oral BID   multivitamin with minerals  1 tablet Oral Daily   nitroGLYCERIN   0.2 mg Transdermal Daily   nutrition supplement (JUVEN)  1 packet Oral BID WC   pantoprazole   40 mg Oral Daily   Continuous Infusions:  ampicillin -sulbactam (UNASYN ) IV 3 g (06/08/24 0545)   vancomycin  Stopped (06/07/24 1948)   Diet: Diet Order             Diet Carb Modified Fluid consistency: Thin; Room service appropriate? Yes  Diet effective now                    Data Reviewed: I have personally reviewed following labs and imaging studies ( see epic result tab) CBC: Recent Labs  Lab 06/04/24 1348 06/04/24 1648 06/05/24 0110 06/07/24 9781 06/08/24 9781  WBC 12.3*  --  12.2* 14.2* 13.4*  NEUTROABS 9.3*  --   --  10.5* 10.2*  HGB 12.8 13.6 11.7* 9.7* 9.3*  HCT 41.7 40.0 38.3 32.8* 30.4*  MCV 85.3  --  86.5 86.8 89.1  PLT 240  --  213 193 193   CMP: Recent Labs  Lab 06/04/24 1348 06/04/24 1648 06/05/24 0110 06/07/24 0218 06/08/24 0218  NA 131* 131* 138 133* 131*  K 4.8 4.8 4.9 4.4 4.5  CL 88*  --  94* 90* 88*  CO2 35*  --  37* 37* 38*  GLUCOSE 483*  --  153* 298* 339*  BUN 16  --  17 28* 22*  CREATININE 0.58  --  0.67 0.72 0.44  CALCIUM  9.6  --  9.1 8.3* 8.2*  MG  --   --  1.4*  --  1.7  PHOS  --   --  4.2  --   --    GFR: Estimated Creatinine Clearance: 82.7 mL/min (by C-G formula based on SCr of 0.44 mg/dL). Recent Labs  Lab 06/04/24 1348 06/05/24 0110  AST 15 13*  ALT 14 12  ALKPHOS 131* 128*  BILITOT 0.3 0.7  PROT 8.5* 8.1  ALBUMIN 3.0* 2.9*   No results for input(s): LIPASE, AMYLASE in the last 168 hours. No results for input(s):  AMMONIA in the last 168 hours. Coagulation Profile: No results for input(s): INR, PROTIME in the last 168 hours. Unresulted Labs (From admission, onward)     Start     Ordered   06/15/24 0500  Creatinine, serum  (enoxaparin  (LOVENOX )    CrCl >/= 30 ml/min)  Weekly,   R     Comments: while on enoxaparin  therapy   Question:  Specimen collection method  Answer:  Lab=Lab collect   06/08/24 1045   06/09/24 0500  CBC  Tomorrow morning,   R       Question:  Specimen collection method  Answer:  Lab=Lab collect   06/08/24 0821   06/09/24 0500  Basic metabolic panel with GFR  Tomorrow morning,   R       Question:  Specimen collection method  Answer:  Lab=Lab collect   06/08/24 0821   06/08/24 1309  Expectorated Sputum Assessment w Gram Stain, Rflx to Resp Cult  Once,   R        06/08/24 1308   06/08/24 1309  Brain natriuretic peptide  Add-on,   AD       Question:  Specimen collection method  Answer:  Lab=Lab collect   06/08/24 1308   06/08/24 1308  Legionella Pneumophila Serogp 1 Ur Ag  Once,   R        06/08/24 1308   06/08/24 1308  Strep pneumoniae urinary antigen  Once,   R        06/08/24 1308   06/08/24 1053  Rapid urine drug screen (hospital performed)  ONCE - STAT,   STAT        06/08/24 1052           Antimicrobials/Microbiology: Anti-infectives (From admission, onward)    Start     Dose/Rate Route Frequency Ordered Stop   06/08/24 1400  azithromycin  (ZITHROMAX ) 500 mg in sodium chloride  0.9 % 250 mL IVPB        500 mg 250 mL/hr over 60 Minutes Intravenous Every 24 hours 06/08/24 1309 06/11/24 1359   06/05/24 1700  vancomycin  (VANCOCIN ) 1,750 mg in sodium chloride  0.9 % 500 mL  IVPB        1,750 mg 258.8 mL/hr over 120 Minutes Intravenous Every 24 hours 06/04/24 1945     06/04/24 2300  Ampicillin -Sulbactam (UNASYN ) 3 g in sodium chloride  0.9 % 100 mL IVPB        3 g 200 mL/hr over 30 Minutes Intravenous Every 6 hours 06/04/24 1945     06/04/24 1630  Vancomycin   (VANCOCIN ) 1,250 mg in sodium chloride  0.9 % 250 mL IVPB        1,250 mg 166.7 mL/hr over 90 Minutes Intravenous  Once 06/04/24 1622 06/04/24 1924   06/04/24 1630  piperacillin -tazobactam (ZOSYN ) IVPB 3.375 g        3.375 g 100 mL/hr over 30 Minutes Intravenous  Once 06/04/24 1622 06/04/24 1720   06/04/24 1615  vancomycin  (VANCOCIN ) 2,000 mg in sodium chloride  0.9 % 500 mL IVPB  Status:  Discontinued        2,000 mg 260 mL/hr over 120 Minutes Intravenous  Once 06/04/24 1611 06/04/24 1622   06/04/24 1615  piperacillin -tazobactam (ZOSYN ) IVPB 4.5 g  Status:  Discontinued        4.5 g 200 mL/hr over 30 Minutes Intravenous  Once 06/04/24 1611 06/04/24 1622         Component Value Date/Time   SDES BLOOD RIGHT ANTECUBITAL 06/05/2024 0110   SPECREQUEST  06/05/2024 0110    BOTTLES DRAWN AEROBIC AND ANAEROBIC Blood Culture adequate volume   CULT  06/05/2024 0110    NO GROWTH 3 DAYS Performed at [facility], [address], [place], [place] [address]    REPTSTATUS PENDING 06/05/2024 0110    Procedures:    Mennie LAMY, MD [facility] 06/08/2024, 1:10 PM

## 2024-06-09 LAB — BASIC METABOLIC PANEL WITH GFR
Anion gap: 6 (ref 5–15)
BUN: 12 mg/dL (ref 6–20)
CO2: 39 mmol/L — ABNORMAL HIGH (ref 22–32)
Calcium: 8.2 mg/dL — ABNORMAL LOW (ref 8.9–10.3)
Chloride: 90 mmol/L — ABNORMAL LOW (ref 98–111)
Creatinine, Ser: 0.49 mg/dL (ref 0.44–1.00)
GFR, Estimated: >60 mL/min (ref >60)
Glucose, Bld: 134 mg/dL — ABNORMAL HIGH (ref 70–99)
Potassium: 3.8 mmol/L (ref 3.5–5.1)
Sodium: 135 mmol/L (ref 135–145)

## 2024-06-09 LAB — CBC
HCT: 28.4 % — ABNORMAL LOW (ref 36.0–46.0)
Hemoglobin: 8.5 g/dL — ABNORMAL LOW (ref 12.0–15.0)
MCH: 26.1 pg (ref 26.0–34.0)
MCHC: 29.9 g/dL — ABNORMAL LOW (ref 30.0–36.0)
MCV: 87.1 fL (ref 80.0–100.0)
Platelets: 215 K/uL (ref 150–400)
RBC: 3.26 MIL/uL — ABNORMAL LOW (ref 3.87–5.11)
RDW: 13.4 % (ref 11.5–15.5)
WBC: 11.7 K/uL — ABNORMAL HIGH (ref 4.0–10.5)
nRBC: 0 % (ref 0.0–0.2)

## 2024-06-09 LAB — CULTURE, BLOOD (ROUTINE X 2)
Culture: NO GROWTH
Special Requests: ADEQUATE

## 2024-06-09 LAB — GLUCOSE, CAPILLARY
Glucose-Capillary: 210 mg/dL — ABNORMAL HIGH (ref 70–99)
Glucose-Capillary: 208 mg/dL — ABNORMAL HIGH (ref 70–99)
Glucose-Capillary: 149 mg/dL — ABNORMAL HIGH (ref 70–99)
Glucose-Capillary: 161 mg/dL — ABNORMAL HIGH (ref 70–99)

## 2024-06-09 MED ORDER — FUROSEMIDE 10 MG/ML IJ SOLN
20.0000 mg | Freq: Every day | INTRAMUSCULAR | Status: DC
  Administered 2024-06-09 – 2024-06-12 (×4): 20 mg via INTRAVENOUS
  Filled 2024-06-09 (×4): qty 2

## 2024-06-09 MED ORDER — ACETAMINOPHEN 325 MG PO TABS
975.0000 mg | ORAL_TABLET | Freq: Three times a day (TID) | ORAL | Status: DC
  Administered 2024-06-09 – 2024-06-21 (×28): 975 mg via ORAL
  Filled 2024-06-09 (×39): qty 3

## 2024-06-09 MED ORDER — OXYCODONE HCL 5 MG PO TABS
5.0000 mg | ORAL_TABLET | Freq: Four times a day (QID) | ORAL | Status: DC | PRN
  Administered 2024-06-09 – 2024-06-16 (×14): 5 mg via ORAL
  Filled 2024-06-09 (×16): qty 1

## 2024-06-09 NOTE — TOC Progression Note (Addendum)
 Transition of Care [facility]) - Progression Note    Patient Details  Name: Bianca Yang MRN: [id number] Date of Birth: January 05, 1976  Transition of Care [facility]) CM/SW Contact  Waddell Barnie Rama, RN Phone Number: 06/09/2024, 10:54 AM  Clinical Narrative:    NCM was informed that father is interested in [facility] services at home,  He is ok with [facility], referral sent to [name] with [facility] and she has accepted referral.  Will need orders for [facility] for  wound care, HHPT, HHOT.  She will go home by car with her father at dc.  SOC will begin 24 to 48 hrs post dc.   Expected Discharge Plan: Skilled Nursing Facility Barriers to Discharge: Continued Medical Work up, SNF Pending bed offer, Insurance Authorization               Expected Discharge Plan and Services In-house Referral: Clinical Social Work     Living arrangements for the past 2 months: Single Family Home                                       Social Drivers of Health (SDOH) Interventions SDOH Screenings   Food Insecurity: Food Insecurity Present (06/04/2024)  Housing: High Risk (06/04/2024)  Transportation Needs: Unmet Transportation Needs (06/04/2024)  Utilities: At Risk (06/04/2024)  Tobacco Use: Medium Risk (06/04/2024)    Readmission Risk Interventions     No data to display

## 2024-06-09 NOTE — Plan of Care (Signed)
°  Problem: Health Behavior/Discharge Planning: Goal: Ability to manage health-related needs will improve Outcome: Progressing   Problem: Metabolic: Goal: Ability to maintain appropriate glucose levels will improve Outcome: Progressing   Problem: Nutritional: Goal: Maintenance of adequate nutrition will improve Outcome: Progressing   Problem: Clinical Measurements: Goal: Ability to maintain clinical measurements within normal limits will improve Outcome: Progressing   Problem: Nutrition: Goal: Adequate nutrition will be maintained Outcome: Progressing

## 2024-06-09 NOTE — TOC Progression Note (Addendum)
 Transition of Care [facility]) - Progression Note    Patient Details  Name: Bianca Yang MRN: [id number] Date of Birth: February 04, 1976  Transition of Care [facility]) CM/SW Contact  [name], [place] Phone Number: 06/09/2024, 8:20 AM  Clinical Narrative:   [name] declined. No other bed offers at this time. CSW to follow up with patients father.  8:35 AM CSW called [name] [phone]) to inform him of lack of bed offers at this time. [name] asked about what other options there are for discharge plan. CSW informed him that we can look at [facility] services or outpt PT. [name] stated he is interested in [facility] services. CSW notified RNCM and MD.   [name] inquired about emergency disability. CSW explained that CSW is unaware if emergency disability is a program but that CSW reached out to [employer] to assist with applying for disability.   CSW will continue to follow.    Expected Discharge Plan: Skilled Nursing Facility Barriers to Discharge: Continued Medical Work up, SNF Pending bed offer, Insurance Authorization               Expected Discharge Plan and Services In-house Referral: Clinical Social Work     Living arrangements for the past 2 months: Single Family Home                                       Social Drivers of Health (SDOH) Interventions SDOH Screenings   Food Insecurity: Food Insecurity Present (06/04/2024)  Housing: High Risk (06/04/2024)  Transportation Needs: Unmet Transportation Needs (06/04/2024)  Utilities: At Risk (06/04/2024)  Tobacco Use: Medium Risk (06/04/2024)    Readmission Risk Interventions     No data to display

## 2024-06-09 NOTE — TOC Progression Note (Signed)
 Transition of Care [facility]) - Progression Note    Patient Details  Name: ANNAJULIA LEWING MRN: [id number] Date of Birth: [birth year]  Transition of Care [facility]) CM/SW Contact  Waddell Barnie Rama, RN Phone Number: 06/09/2024, 12:56 PM  Clinical Narrative:    Per MD patient is on 8-10 liters of oxygen , she will not be ready for dc til she has been weaned down to 2liters.   Expected Discharge Plan: Skilled Nursing Facility Barriers to Discharge: Continued Medical Work up, SNF Pending bed offer, Insurance Authorization               Expected Discharge Plan and Services In-house Referral: Clinical Social Work     Living arrangements for the past 2 months: Single Family Home                                       Social Drivers of Health (SDOH) Interventions SDOH Screenings   Food Insecurity: Food Insecurity Present (06/04/2024)  Housing: High Risk (06/04/2024)  Transportation Needs: Unmet Transportation Needs (06/04/2024)  Utilities: At Risk (06/04/2024)  Tobacco Use: Medium Risk (06/04/2024)    Readmission Risk Interventions     No data to display

## 2024-06-09 NOTE — Progress Notes (Signed)
 Physical Therapy Treatment Patient Details Name: Bianca Yang MRN: [id number] DOB: 30-Oct-1975 Today's Date: 06/09/2024   History of Present Illness Pt is a 48 y.o. F presenting to [facility] on 06/04/24 with L foot pain and swelling following L foot 5th ray amputation on 05/05/24. PMH is significant for DM, cocaine abuse.    PT Comments  Pt participated in [facility] training, performing squat pivot transfer with no physical assistance and heavy VC for sequencing and maintaining WB restrictions, and educated on appropriate use of WC (breaks, removable arms). Pt continues to be impulsive with mobility attempting to mobilize without prompting or AD. Pt would benefit from trialing scoot transfer to [facility] to offload LLE and maintain WB restrictions. PT will continue to treat pt while she is admitted. Patient will benefit from continued inpatient follow up therapy, <3 hours/day.    If plan is discharge home, recommend the following: A lot of help with walking and/or transfers;A lot of help with bathing/dressing/bathroom;Assistance with cooking/housework;Direct supervision/assist for medications management;Direct supervision/assist for financial management;Assist for transportation;Help with stairs or ramp for entrance   Can travel by private vehicle     No  Equipment Recommendations  Wheelchair (measurements PT);Wheelchair cushion (measurements PT)    Recommendations for Other Services       Precautions / Restrictions Precautions Precautions: Fall Recall of Precautions/Restrictions: Impaired Restrictions Weight Bearing Restrictions Per Provider Order: Yes LLE Weight Bearing Per Provider Order: Non weight bearing Other Position/Activity Restrictions: Pt impulsive with tendency to WB through LLE despite cueing and constant reinforcement not to.     Mobility  Bed Mobility Overal bed mobility: Modified Independent Bed Mobility: Supine to Sit, Sit to Supine     Supine to sit: Modified independent  (Device/Increase time) Sit to supine: Modified independent (Device/Increase time)   General bed mobility comments: Increased time to complete    Transfers Overall transfer level: Needs assistance Equipment used: None Transfers: Bed to chair/wheelchair/BSC       Squat pivot transfers: Contact guard assist     General transfer comment: Pt completed squat pivot transfer from EOB to WC and back to EOB w/ CGA and BUE supported on arms of WC. VC given for sequencing and for WB restrictions. Increased time to complete.    Ambulation/Gait                   Stairs             Wheelchair Mobility     Tilt Bed Tilt Bed Patient on Tilt Bed?:  (pt educated on use of WC (brakes, removeable arms))  Modified Rankin (Stroke Patients Only)       Balance Overall balance assessment: Needs assistance Sitting-balance support: No upper extremity supported, Feet supported Sitting balance-Leahy Scale: Good Sitting balance - Comments: seated EOB                                    Communication Communication Communication: No apparent difficulties  Cognition Arousal: Alert Behavior During Therapy: Impulsive, Restless   PT - Cognitive impairments: Awareness, Attention, Initiation, Sequencing, Problem solving, Safety/Judgement                       PT - Cognition Comments: Pt with difficulty maintaining WB restriction throughout session and attempts to mobilize without prompting from therapist, not abiding by WB restrictions. Following commands: Impaired Following commands impaired: Follows multi-step commands inconsistently  Cueing Cueing Techniques: Verbal cues, Gestural cues  Exercises      General Comments General comments (skin integrity, edema, etc.): on 10L HFNC      Pertinent Vitals/Pain Pain Assessment Pain Assessment: No/denies pain Pain Score: 0-No pain Pain Intervention(s): Monitored during session    Home Living                           Prior Function            PT Goals (current goals can now be found in the care plan section) Acute Rehab PT Goals Patient Stated Goal: to get better PT Goal Formulation: With patient Time For Goal Achievement: 06/19/24 Potential to Achieve Goals: Fair Progress towards PT goals: Progressing toward goals    Frequency    Min 2X/week      PT Plan      Co-evaluation              AM-PAC PT 6 Clicks Mobility   Outcome Measure  Help needed turning from your back to your side while in a flat bed without using bedrails?: A Little Help needed moving from lying on your back to sitting on the side of a flat bed without using bedrails?: A Little Help needed moving to and from a bed to a chair (including a wheelchair)?: A Little Help needed standing up from a chair using your arms (e.g., wheelchair or bedside chair)?: A Little Help needed to walk in hospital room?: Total (to maintain WB restrictions) Help needed climbing 3-5 steps with a railing? : Total 6 Click Score: 14    End of Session Equipment Utilized During Treatment: Oxygen Activity Tolerance: Patient tolerated treatment well Patient left: in bed;with call bell/phone within reach;with bed alarm set Nurse Communication: Mobility status;Weight bearing status PT Visit Diagnosis: Unsteadiness on feet (R26.81);Muscle weakness (generalized) (M62.81);Pain Pain - Right/Left: Left Pain - part of body: Ankle and joints of foot     Time: 1419-1432 PT Time Calculation (min) (ACUTE ONLY): 13 min  Charges:    $Wheel Chair Management: 8-22 mins PT General Charges $$ ACUTE PT VISIT: 1 Visit                     Leontine Hilt DPT Acute Rehab Services [phone] Prefer contact via chat    Leontine NOVAK Lanitra Battaglini 06/09/2024, 3:39 PM

## 2024-06-09 NOTE — Progress Notes (Signed)
 PROGRESS NOTE Bianca Yang  FMW:[id number] DOB: 09-Feb-1976 DOA: 06/04/2024 PCP: Patient, [name] Per  Brief Narrative/Hospital Course: Bianca Yang is a 48 y.o. female with past medical history significant of DM2, diabetic foot ulcer, OSA, PAD presented to hospital with left foot pain and swelling.  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  In the ED patient was afebrile.  Labs were notable for mild leukocytosis but lactate was within normal range.  BMP showed mild hyponatremia with sodium of 131 with a glucose elevation at 483.  ESR was elevated at 41 with CRP of 2.9.  X-ray of the left foot showed large lateral midfoot ulceration with lytic destruction of the cuboid and proximal base of the fourth metatarsal suspicious osteomyelitis.  CT scan of the foot was then performed which showed acute osteomyelitis involving the fourth metatarsal and cuboid.  Patient was then considered for admission to the hospital for further evaluation and treatment Patient continued on IV antibiotics.  Also noted to be hypercapnic and hypoxic needing HFNC 12/11: Seen by PCCM  Subjective: Seen and examined  Father at beside  Appears much more alert awake interactive.  Does look fidgety, appears chronically sick and much older than her stated age. Overnight afebrile BP stable SpO2 90s  on 10l- I weaned to 8l and spo2 at 91-92%, no new complaints Labs reviewed bicarb remains chronically elevated at 39 CBC with WBC down 11.7 hemoglobin 8.5 Blood sugar 92-210   Assessment and plan:  Diabetic foot infection with question osteomyelitis of the left foot metatarsal, wound dehiscence: Inflammation marker elevated CRP ESR.  Imaging CT scan and x-ray of the foot suggestive of osteomyelitis.  ABI normal from 12/8. Dr. Harden advised [facility] LLE- stated there is bone marrow edema with no plans for surgical  intervention at this time. Patient remains on broad-spectrum antibiotics and hopefully transition to p.o. and arrange outpatient follow-up w/ [facility] Continue blood culture NGTD since admission, remains on Unasyn  and vancomycin  Patient has had fever of 102 F 12/10, afebrile since and WBC improving Had a fall 12/10 while trying to go to the bathroom.  Encouraged and educated on nonweightbearing status.  Reported obstructive sleep apnea/OSA Acute on chronic hypoxia and hypercapnea-noncompliant to CPAP: Patient with history of sleep apnea (?) not compliant with CPAP. VBG in the ED-pH 7.38 pCO2 68 pO2 52 showed evidence of hypercapnia.-On recheck about the same. PCCM has been consulted-suspect hypoxia driven by untreated OSA as well as hypoventilation somnolence, continue on CAP coverage for multifocal pneumonia continue bronchodilators supplemental oxygen and CPAP bedtime if she allows and continue to encourage to use.  BNP stable at 200, 2D echo EF 45-50% -previous echo to compare has mildly decreased LV function no RWMA RV SF mildly reduced RV size mildly enlarged mitral valve is abnormal-mild MR, no evidence of stenosis.  No evidence of fluid overload BNP 2o8. She will need outpatient further follow-up with cardiology  MultifocaL Pneumonia Tobacco abuse-quit recently: CXR  12/11-Diffuse interstitial lung opacity with clustered micro nodularity in the right mid lung and right base as well as left lower lobe. Imaging features suggest multifocal pneumonia  Continue vancomycin  and Unasyn  azithromycin  added 12/11 x 3 doses, seen by PCCM continue supplemental oxygen, follow-up urinary antigens sputum culture . She smoked and quit a month ago. Less than pack a day x 28 yrs   DM type  2 with uncontrolled hyperglycemia: Latest hemoglobin A1c of 11.6 a month.  Continue current lantus  to 15u a, cont ssi and add premela 5 u tid if eating > 50% of meal  Patient has high chances of noncompliance with diet and  medication. Recent Labs  Lab 06/08/24 0605 06/08/24 1137 06/08/24 1602 06/08/24 2247 06/09/24 0611  GLUCAP 338* 166* 127* 92 210*    Hyponatremia: Monitor likely from hyperglycemia  Social issues: Patient might need more support at home. toc consulted.  Hypomagnesemia: Improved  Hypoalbuminemia: Augment diet RD following  Normocytic anemia: Some drop in hemoglobin holding stable and angiogram although on admission in 12. Check anemia panel Recent Labs    06/04/24 1648 06/05/24 0110 06/07/24 0218 06/08/24 0218 06/09/24 0215  HGB 13.6 11.7* 9.7* 9.3* 8.5*  MCV  --  86.5 86.8 89.1 87.1   Substance abuse History of cocaine abuse Hx of crack cocaine use, + a month ago-  UDS positive for opiates Patient appears much older than her stated age.  She is residing at her father's house who is taking care of  Mobility: PT Orders: Active PT Follow up Rec: Skilled Nursing-Short Term Rehab (<3 Hours/Day)06/07/2024 1510   DVT prophylaxis: enoxaparin  (LOVENOX ) injection 40 mg Start: 06/08/24 1100 SCDs Start: 06/08/24 1046 SCDs Start: 06/04/24 1922  Code Status:   Code Status: Full Code Family Communication: plan of care discussed with patient at bedside. Patient status is: Remains hospitalized because of severity of illness Level of care: Progressive   Dispo: The patient is from: HOME            Anticipated disposition: TBD .  Per TOC no SNF willing to accept her .  Will set up home health  Objective: Vitals last 24 hrs: Vitals:   06/08/24 2146 06/08/24 2246 06/09/24 0809 06/09/24 0832  BP:  112/70 109/63   Pulse: 78 79 79 82  Resp:  19 18 17   Temp:  99.2 F (37.3 C) 98.2 F (36.8 C)   TempSrc:  Oral Oral   SpO2: (!) 89% 90% 93% 95%  Weight:      Height:        Physical Examination: General exam: alert awake, appears much older than her age and chronically sick looking HEENT:Oral mucosa moist, Ear/Nose WNL grossly Respiratory system: Bilaterally clear BS,no use  of accessory muscle Cardiovascular system: S1 & S2 +, No JVD. Gastrointestinal system: Abdomen soft,NT,ND, BS+ Nervous System: Alert, awake, moving all extremities,and following commands. Extremities: extremities warm, leg edema neg Rt foot in dressing+  Skin: Warm, no rashes MSK: Normal muscle bulk,tone, power   Medications reviewed:  Scheduled Meds:  (feeding supplement) PROSource Plus  30 mL Oral BID WC   enoxaparin  (LOVENOX ) injection  40 mg Subcutaneous Daily   feeding supplement (GLUCERNA SHAKE)  237 mL Oral TID BM   insulin  aspart  0-15 Units Subcutaneous TID WC   insulin  aspart  0-5 Units Subcutaneous QHS   insulin  aspart  5 Units Subcutaneous TID WC   insulin  glargine  15 Units Subcutaneous Daily   magnesium  oxide  400 mg Oral BID   multivitamin with minerals  1 tablet Oral Daily   nitroGLYCERIN   0.2 mg Transdermal Daily   nutrition supplement (JUVEN)  1 packet Oral BID WC   pantoprazole   40 mg Oral Daily   Continuous Infusions:  ampicillin -sulbactam (UNASYN ) IV 3 g (06/09/24 0636)   azithromycin  500 mg (06/08/24 1449)   vancomycin  Stopped (06/09/24 0150)   Diet: Diet Order  Diet Carb Modified Fluid consistency: Thin; Room service appropriate? Yes  Diet effective now                    Data Reviewed: I have personally reviewed following labs and imaging studies ( see epic result tab) CBC: Recent Labs  Lab 06/04/24 1348 06/04/24 1648 06/05/24 0110 06/07/24 0218 06/08/24 0218 06/09/24 0215  WBC 12.3*  --  12.2* 14.2* 13.4* 11.7*  NEUTROABS 9.3*  --   --  10.5* 10.2*  --   HGB 12.8 13.6 11.7* 9.7* 9.3* 8.5*  HCT 41.7 40.0 38.3 32.8* 30.4* 28.4*  MCV 85.3  --  86.5 86.8 89.1 87.1  PLT 240  --  213 193 193 215   CMP: Recent Labs  Lab 06/04/24 1348 06/04/24 1648 06/05/24 0110 06/07/24 0218 06/08/24 0218 06/09/24 0215  NA 131* 131* 138 133* 131* 135  K 4.8 4.8 4.9 4.4 4.5 3.8  CL 88*  --  94* 90* 88* 90*  CO2 35*  --  37* 37* 38* 39*   GLUCOSE 483*  --  153* 298* 339* 134*  BUN 16  --  17 28* 22* 12  CREATININE 0.58  --  0.67 0.72 0.44 0.49  CALCIUM  9.6  --  9.1 8.3* 8.2* 8.2*  MG  --   --  1.4*  --  1.7  --   PHOS  --   --  4.2  --   --   --    GFR: Estimated Creatinine Clearance: 82.7 mL/min (by C-G formula based on SCr of 0.49 mg/dL). Recent Labs  Lab 06/04/24 1348 06/05/24 0110  AST 15 13*  ALT 14 12  ALKPHOS 131* 128*  BILITOT 0.3 0.7  PROT 8.5* 8.1  ALBUMIN 3.0* 2.9*   No results for input(s): LIPASE, AMYLASE in the last 168 hours. No results for input(s): AMMONIA in the last 168 hours. Coagulation Profile: No results for input(s): INR, PROTIME in the last 168 hours. Unresulted Labs (From admission, onward)     Start     Ordered   06/15/24 0500  Creatinine, serum  (enoxaparin  (LOVENOX )    CrCl >/= 30 ml/min)  Weekly,   R     Comments: while on enoxaparin  therapy   Question:  Specimen collection method  Answer:  Lab=Lab collect   06/08/24 1045   06/10/24 0500  Basic metabolic panel with GFR  Tomorrow morning,   R       Question:  Specimen collection method  Answer:  Lab=Lab collect   06/09/24 0838   06/10/24 0500  CBC  Tomorrow morning,   R       Question:  Specimen collection method  Answer:  Lab=Lab collect   06/09/24 0838   06/08/24 1309  Expectorated Sputum Assessment w Gram Stain, Rflx to Resp Cult  Once,   R        06/08/24 1308   06/08/24 1308  Legionella Pneumophila Serogp 1 Ur Ag  Once,   R        06/08/24 1308   06/08/24 1308  Strep pneumoniae urinary antigen  Once,   R        06/08/24 1308           Antimicrobials/Microbiology: Anti-infectives (From admission, onward)    Start     Dose/Rate Route Frequency Ordered Stop   06/08/24 1400  azithromycin  (ZITHROMAX ) 500 mg in sodium chloride  0.9 % 250 mL IVPB  500 mg 250 mL/hr over 60 Minutes Intravenous Every 24 hours 06/08/24 1309 06/11/24 1359   06/05/24 1700  vancomycin  (VANCOCIN ) 1,750 mg in sodium chloride  0.9 %  500 mL IVPB        1,750 mg 258.8 mL/hr over 120 Minutes Intravenous Every 24 hours 06/04/24 1945     06/04/24 2300  Ampicillin -Sulbactam (UNASYN ) 3 g in sodium chloride  0.9 % 100 mL IVPB        3 g 200 mL/hr over 30 Minutes Intravenous Every 6 hours 06/04/24 1945     06/04/24 1630  Vancomycin  (VANCOCIN ) 1,250 mg in sodium chloride  0.9 % 250 mL IVPB        1,250 mg 166.7 mL/hr over 90 Minutes Intravenous  Once 06/04/24 1622 06/04/24 1924   06/04/24 1630  piperacillin -tazobactam (ZOSYN ) IVPB 3.375 g        3.375 g 100 mL/hr over 30 Minutes Intravenous  Once 06/04/24 1622 06/04/24 1720   06/04/24 1615  vancomycin  (VANCOCIN ) 2,000 mg in sodium chloride  0.9 % 500 mL IVPB  Status:  Discontinued        2,000 mg 260 mL/hr over 120 Minutes Intravenous  Once 06/04/24 1611 06/04/24 1622   06/04/24 1615  piperacillin -tazobactam (ZOSYN ) IVPB 4.5 g  Status:  Discontinued        4.5 g 200 mL/hr over 30 Minutes Intravenous  Once 06/04/24 1611 06/04/24 1622         Component Value Date/Time   SDES BLOOD RIGHT ANTECUBITAL 06/05/2024 0110   SPECREQUEST  06/05/2024 0110    BOTTLES DRAWN AEROBIC AND ANAEROBIC Blood Culture adequate volume   CULT  06/05/2024 0110    NO GROWTH 4 DAYS Performed at [facility], [address], [place], [place] [address]    REPTSTATUS PENDING 06/05/2024 0110    Procedures:    Mennie LAMY, MD [facility] 06/09/2024, 12:22 PM

## 2024-06-10 DIAGNOSIS — Z792 Long term (current) use of antibiotics: Secondary | ICD-10-CM

## 2024-06-10 DIAGNOSIS — E1169 Type 2 diabetes mellitus with other specified complication: Secondary | ICD-10-CM

## 2024-06-10 LAB — RETICULOCYTES
Immature Retic Fract: 18.9 % — ABNORMAL HIGH (ref 2.3–15.9)
RBC.: 3.27 MIL/uL — ABNORMAL LOW (ref 3.87–5.11)
Retic Count, Absolute: 50 K/uL (ref 19.0–186.0)
Retic Ct Pct: 1.5 % (ref 0.4–3.1)

## 2024-06-10 LAB — IRON AND TIBC
Iron: 21 ug/dL — ABNORMAL LOW (ref 28–170)
Saturation Ratios: 9 % — ABNORMAL LOW (ref 10.4–31.8)
TIBC: 230 ug/dL — ABNORMAL LOW (ref 250–450)
UIBC: 209 ug/dL

## 2024-06-10 LAB — BASIC METABOLIC PANEL WITH GFR
Anion gap: 5 (ref 5–15)
BUN: 18 mg/dL (ref 6–20)
CO2: 37 mmol/L — ABNORMAL HIGH (ref 22–32)
Calcium: 8.4 mg/dL — ABNORMAL LOW (ref 8.9–10.3)
Chloride: 91 mmol/L — ABNORMAL LOW (ref 98–111)
Creatinine, Ser: 0.59 mg/dL (ref 0.44–1.00)
GFR, Estimated: >60 mL/min (ref >60)
Glucose, Bld: 264 mg/dL — ABNORMAL HIGH (ref 70–99)
Potassium: 4.5 mmol/L (ref 3.5–5.1)
Sodium: 133 mmol/L — ABNORMAL LOW (ref 135–145)

## 2024-06-10 LAB — FOLATE: Folate: 13.8 ng/mL (ref >5.9)

## 2024-06-10 LAB — CBC
HCT: 29 % — ABNORMAL LOW (ref 36.0–46.0)
Hemoglobin: 8.8 g/dL — ABNORMAL LOW (ref 12.0–15.0)
MCH: 26.7 pg (ref 26.0–34.0)
MCHC: 30.3 g/dL (ref 30.0–36.0)
MCV: 87.9 fL (ref 80.0–100.0)
Platelets: 253 K/uL (ref 150–400)
RBC: 3.3 MIL/uL — ABNORMAL LOW (ref 3.87–5.11)
RDW: 13.7 % (ref 11.5–15.5)
WBC: 10.9 K/uL — ABNORMAL HIGH (ref 4.0–10.5)
nRBC: 0 % (ref 0.0–0.2)

## 2024-06-10 LAB — CULTURE, BLOOD (ROUTINE X 2)
Culture: NO GROWTH
Special Requests: ADEQUATE

## 2024-06-10 LAB — GLUCOSE, CAPILLARY
Glucose-Capillary: 204 mg/dL — ABNORMAL HIGH (ref 70–99)
Glucose-Capillary: 175 mg/dL — ABNORMAL HIGH (ref 70–99)
Glucose-Capillary: 283 mg/dL — ABNORMAL HIGH (ref 70–99)
Glucose-Capillary: 136 mg/dL — ABNORMAL HIGH (ref 70–99)

## 2024-06-10 LAB — FERRITIN: Ferritin: 133 ng/mL (ref 11–307)

## 2024-06-10 LAB — VITAMIN B12: Vitamin B-12: 332 pg/mL (ref 180–914)

## 2024-06-10 NOTE — TOC Progression Note (Signed)
 Transition of Care [facility]) - Initial/Assessment Note    Patient Details  Name: Bianca Yang MRN: [id number] Date of Birth: 13-Mar-1976  [facility] CM/SW Contact:    Bianca JULIANNA Bennetts, LCSW Phone Number: 06/10/2024, 11:21 AM  Clinical Narrative:                 CSW received consult due to patient's father and sister being in patient room and requesting information about discharge plan.  CSW went by patient's room x2 and there was no family in the room.  CSW contacted pt's father, Bianca Yang, and answered all questions in reference to discharge.  Father requested medical update.  Secure chat sent to MD informing of family's request.  ICM will continue to follow.    Expected Discharge Plan: Skilled Nursing Facility Barriers to Discharge: Continued Medical Work up, SNF Pending bed offer, Insurance Authorization   Patient Goals and CMS Choice Patient states their goals for this hospitalization and ongoing recovery are:: To go to rehab CMS Medicare.gov Compare Post Acute Care list provided to:: Patient Choice offered to / list presented to : Patient [place] ownership interest in [facility].provided to:: Patient    Expected Discharge Plan and Services In-house Referral: Clinical Social Work     Living arrangements for the past 2 months: Single Family Home                                      Prior Living Arrangements/Services Living arrangements for the past 2 months: Single Family Home Lives with:: Self Patient language and need for interpreter reviewed:: Yes Do you feel safe going back to the place where you live?: Yes      Need for Family Participation in Patient Care: No (Comment) Care giver support system in place?: No (comment)   Criminal Activity/Legal Involvement Pertinent to Current Situation/Hospitalization: No - Comment as needed  Activities of Daily Living   ADL Screening (condition at time of admission) Independently performs  ADLs?: No Does the patient have a NEW difficulty with bathing/dressing/toileting/self-feeding that is expected to last >3 days?: Yes (Initiates electronic notice to provider for possible OT consult) Does the patient have a NEW difficulty with getting in/out of bed, walking, or climbing stairs that is expected to last >3 days?: Yes (Initiates electronic notice to provider for possible PT consult) Does the patient have a NEW difficulty with communication that is expected to last >3 days?: No Is the patient deaf or have difficulty hearing?: No Does the patient have difficulty seeing, even when wearing glasses/contacts?: No Does the patient have difficulty concentrating, remembering, or making decisions?: No  Permission Sought/Granted Permission sought to share information with : Facility Industrial/product Designer granted to share information with : Yes, Verbal Permission Granted     Permission granted to share info w AGENCY: SNFs        Emotional Assessment Appearance:: Appears stated age Attitude/Demeanor/Rapport: Engaged Affect (typically observed): Stable Orientation: : Oriented to Self, Oriented to Place, Oriented to  Time, Oriented to Situation Alcohol / Substance Use: Not Applicable Psych Involvement: No (comment)  Admission diagnosis:  Osteomyelitis of left foot (HCC) [M86.9] Cellulitis and abscess of toe of left foot [L03.032, L02.612] Acute osteomyelitis of metatarsal bone of left foot (HCC) [M86.172] Patient Active Problem List   Diagnosis Date Noted   Acute osteomyelitis of metatarsal bone of left foot (HCC) 06/05/2024   Cellulitis  and abscess of toe of left foot 06/05/2024   Osteomyelitis of left foot (HCC) 06/04/2024   Hypercapnia 06/04/2024   Cutaneous abscess of left foot 05/04/2024   Diabetic foot infection (HCC) 05/03/2024   Trichomonas vaginitis 01/17/2020   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 [place] [address] Phone: [phone] Fax: [phone]  [facility] [address] [place]), [place] - [address] [place] [address] Phone: [phone] Fax: [phone]  [facility] [address] [place] [address] Phone: [phone] Fax: [phone]  [place] [facility] [address] [place] [address] Phone: [phone] Fax: [phone]     Social Drivers of Health (SDOH) Social History: SDOH Screenings   Food Insecurity: Food Insecurity Present (06/04/2024)  Housing: High Risk (06/04/2024)  Transportation Needs: Unmet Transportation Needs (06/04/2024)  Utilities: At Risk (06/04/2024)  Tobacco Use: Medium Risk (06/04/2024)   SDOH Interventions:     Readmission Risk Interventions     No data to display

## 2024-06-10 NOTE — Progress Notes (Signed)
 PROGRESS NOTE GISSELE Yang  FMW:[id number] DOB: 07-Dec-1975 DOA: 06/04/2024 PCP: Patient, [name] Per  Brief Narrative/Hospital Course: Bianca Yang is a 48 y.o. female with past medical history significant of DM2, diabetic foot ulcer, OSA, PAD presented to hospital with left foot pain and swelling.  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  In the ED patient was afebrile.  Labs were notable for mild leukocytosis but lactate was within normal range.  BMP showed mild hyponatremia with sodium of 131 with a glucose elevation at 483.  ESR was elevated at 41 with CRP of 2.9.  X-ray of the left foot showed large lateral midfoot ulceration with lytic destruction of the cuboid and proximal base of the fourth metatarsal suspicious osteomyelitis.  CT scan of the foot was then performed which showed acute osteomyelitis involving the fourth metatarsal and cuboid.  Patient was then considered for admission to the hospital for further evaluation and treatment Patient continued on IV antibiotics.  Also noted to be hypercapnic and hypoxic needing HFNC 12/11: Seen by PCCM  Subjective: Seen and examined today Feels well, father not at the bedside Overnight afebrile vital stable remains on HFNC weaned to 6l She feels better complains of pain however  Assessment and plan:  Diabetic foot infection with question osteomyelitis of the left foot metatarsal, wound dehiscence: Inflammation marker elevated CRP ESR.  Imaging CT scan and x-ray of the foot suggestive of acute osteomyelitis.ABI normal -12/8. Dr. Harden advised [facility] LLE- stated there is bone marrow edema with no plans for surgical intervention at this time. Patient remains on broad-spectrum antibiotics and hopefully transition to p.o. and arrange outpatient follow-up w/ [facility] Blood culture remains sterile.Continue to optimize blood  sugar.   Requested infectious disease consultation-with her compliance worried she might lose her feet In future. Dr. Harden continues to follow-up on the wound. Patient has had fever of 102 F 12/10, afebrile since and WBC downtrending Had a fall 12/10 while trying to go to the bathroom.  Encouraged and educated on nonweightbearing status.  Reported obstructive sleep apnea/OSA Acute on chronic hypoxia and hypercapnea-noncompliant to CPAP: Patient with history of sleep apnea (?) not compliant with CPAP. VBG in the ED-pH 7.38 pCO2 68 pO2 52 showed evidence of hypercapnia.-On recheck about the same. PCCM has been consulted-suspect hypoxia driven by untreated OSA as well as hypoventilation somnolence, continue on CAP coverage for multifocal pneumonia continue bronchodilators supplemental oxygen and CPAP bedtime if she allows and continue to encourage to use.  BNP stable at 200, 2D echo EF 45-50% -previous echo to compare has mildly decreased LV function no RWMA RV SF mildly reduced RV size mildly enlarged mitral valve is abnormal-mild MR, no evidence of stenosis.  No evidence of fluid overload BNP 2o8. She will need outpatient further follow-up with cardiology  MultifocaL Pneumonia Tobacco abuse-quit recently: CXR  12/11-Diffuse interstitial lung opacity with clustered micro nodularity  in RML, rt  base LLL-suggest multifocal pneumonia  Continue vancomycin  and Unasyn  azithromycin  added 12/11 x 3 doses, seen by PCCM continue supplemental oxygen, Urine antigens ordered unable to be collected. She smoked and quit a month ago. Less than pack a day x 28 yrs   DM type 2 with uncontrolled hyperglycemia: Latest hemoglobin A1c of 11.6 a month.  Continue current [id number] a,ssi and add premela 5 u tid if eating > 50% of meal  Patient has high chances of noncompliance with diet and medication.  PT eval Glimepiride  and metformin , consult diabetes continue to [place] likely needs to go home with insulin  Recent  Labs  Lab 06/09/24 0611 06/09/24 1124 06/09/24 1611 06/09/24 2048 06/10/24 0602  GLUCAP 210* 208* 149* 161* 204*    Hyponatremia: likely from hyperglycemia, monitor  Social issues: Patient might need more support at home. toc consulted.  Hypomagnesemia: Improved  Hypoalbuminemia: Augment diet RD following  Normocytic anemia: Some drop in hemoglobin holding stable -although on admission in 12. Check anemia panel. Recent Labs    06/05/24 0110 06/07/24 0218 06/08/24 0218 06/09/24 0215 06/10/24 0213  HGB 11.7* 9.7* 9.3* 8.5* 8.8*  MCV 86.5 86.8 89.1 87.1 87.9   Substance abuse History of cocaine abuse Hx of crack cocaine use, + a month ago-  UDS positive for opiates Patient appears much older than her stated age.  She is residing at her father's house who is taking care of  Mobility: PT Orders: Active PT Follow up Rec: Skilled Nursing-Short Term Rehab (<3 Hours/Day)06/09/2024 1500   DVT prophylaxis: enoxaparin  (LOVENOX ) injection 40 mg Start: 06/08/24 1100 SCDs Start: 06/08/24 1046 SCDs Start: 06/04/24 1922  Code Status:   Code Status: Full Code Family Communication: plan of care discussed with patient at bedside. Patient status is: Remains hospitalized because of severity of illness Level of care: Progressive   Dispo: The patient is from: HOME            Anticipated disposition: TBD .  Per TOC no SNF willing to accept her.  Patient and her father planning to take her home with home health  Objective: Vitals last 24 hrs: Vitals:   06/09/24 1453 06/09/24 1950 06/09/24 2308 06/10/24 0733  BP:  122/67 124/63 (!) 135/90  Pulse: 81 86 94 96  Resp: 18 18 19 18   Temp:  98.7 F (37.1 C) 99.3 F (37.4 C) 99.3 F (37.4 C)  TempSrc:  Oral Oral Oral  SpO2: 95% 90% 90% 90%  Weight:      Height:        Physical Examination: General exam: aaox3 HEENT:Oral mucosa moist, Ear/Nose WNL grossly Respiratory system: CTA Bilaterally  Cardiovascular system: S1 & S2 +, No  JVD. Gastrointestinal system: Abdomen soft,NT,ND, BS+ Nervous System: Alert, awake, moving all extremities,and following commands. Extremities: extremities warm, leg edema neg , left foot dressing + Skin: Warm, no rashes MSK: Normal muscle bulk,tone, power   Medications reviewed:  Scheduled Meds:  (feeding supplement) PROSource Plus  30 mL Oral BID WC   acetaminophen   975 mg Oral TID   enoxaparin  (LOVENOX ) injection  40 mg Subcutaneous Daily   feeding supplement (GLUCERNA SHAKE)  237 mL Oral TID BM   furosemide   20 mg Intravenous Daily   insulin  aspart  0-15 Units Subcutaneous TID WC   insulin  aspart  0-5 Units Subcutaneous QHS   insulin  aspart  5 Units Subcutaneous TID WC   insulin  glargine  15 Units Subcutaneous Daily   magnesium  oxide  400 mg Oral BID   multivitamin with minerals  1 tablet Oral Daily   nitroGLYCERIN   0.2 mg Transdermal Daily   nutrition supplement (JUVEN)  1 packet Oral BID WC   pantoprazole   40 mg Oral Daily   Continuous Infusions:  ampicillin -sulbactam (UNASYN ) IV 3 g (06/10/24 0519)   azithromycin  500 mg (06/09/24 1300)   vancomycin  1,750 mg (06/09/24 1805)   Diet: Diet Order  Diet Carb Modified Fluid consistency: Thin; Room service appropriate? Yes  Diet effective now                    Data Reviewed: I have personally reviewed following labs and imaging studies ( see epic result tab) CBC: Recent Labs  Lab 06/04/24 1348 06/04/24 1648 06/05/24 0110 06/07/24 0218 06/08/24 0218 06/09/24 0215 06/10/24 0213  WBC 12.3*  --  12.2* 14.2* 13.4* 11.7* 10.9*  NEUTROABS 9.3*  --   --  10.5* 10.2*  --   --   HGB 12.8   < > 11.7* 9.7* 9.3* 8.5* 8.8*  HCT 41.7   < > 38.3 32.8* 30.4* 28.4* 29.0*  MCV 85.3  --  86.5 86.8 89.1 87.1 87.9  PLT 240  --  213 193 193 215 253   < > = values in this interval not displayed.   CMP: Recent Labs  Lab 06/05/24 0110 06/07/24 0218 06/08/24 0218 06/09/24 0215 06/10/24 0213  NA 138 133* 131* 135  133*  K 4.9 4.4 4.5 3.8 4.5  CL 94* 90* 88* 90* 91*  CO2 37* 37* 38* 39* 37*  GLUCOSE 153* 298* 339* 134* 264*  BUN 17 28* 22* 12 18  CREATININE 0.67 0.72 0.44 0.49 0.59  CALCIUM  9.1 8.3* 8.2* 8.2* 8.4*  MG 1.4*  --  1.7  --   --   PHOS 4.2  --   --   --   --    GFR: Estimated Creatinine Clearance: 82.7 mL/min (by C-G formula based on SCr of 0.59 mg/dL). Recent Labs  Lab 06/04/24 1348 06/05/24 0110  AST 15 13*  ALT 14 12  ALKPHOS 131* 128*  BILITOT 0.3 0.7  PROT 8.5* 8.1  ALBUMIN 3.0* 2.9*   No results for input(s): LIPASE, AMYLASE in the last 168 hours. No results for input(s): AMMONIA in the last 168 hours. Coagulation Profile: No results for input(s): INR, PROTIME in the last 168 hours. Unresulted Labs (From admission, onward)     Start     Ordered   06/15/24 0500  Creatinine, serum  (enoxaparin  (LOVENOX )    CrCl >/= 30 ml/min)  Weekly,   R     Comments: while on enoxaparin  therapy   Question:  Specimen collection method  Answer:  Lab=Lab collect   06/08/24 1045   06/08/24 1309  Expectorated Sputum Assessment w Gram Stain, Rflx to Resp Cult  Once,   R        06/08/24 1308   06/08/24 1308  Legionella Pneumophila Serogp 1 Ur Ag  Once,   R        06/08/24 1308   06/08/24 1308  Strep pneumoniae urinary antigen  Once,   R        06/08/24 1308           Antimicrobials/Microbiology: Anti-infectives (From admission, onward)    Start     Dose/Rate Route Frequency Ordered Stop   06/08/24 1400  azithromycin  (ZITHROMAX ) 500 mg in sodium chloride  0.9 % 250 mL IVPB        500 mg 250 mL/hr over 60 Minutes Intravenous Every 24 hours 06/08/24 1309 06/11/24 0959   06/05/24 1700  vancomycin  (VANCOCIN ) 1,750 mg in sodium chloride  0.9 % 500 mL IVPB        1,750 mg 258.8 mL/hr over 120 Minutes Intravenous Every 24 hours 06/04/24 1945     06/04/24 2300  Ampicillin -Sulbactam (UNASYN ) 3 g in sodium chloride  0.9 % 100  mL IVPB        3 g 200 mL/hr over 30 Minutes Intravenous  Every 6 hours 06/04/24 1945     06/04/24 1630  Vancomycin  (VANCOCIN ) 1,250 mg in sodium chloride  0.9 % 250 mL IVPB        1,250 mg 166.7 mL/hr over 90 Minutes Intravenous  Once 06/04/24 1622 06/04/24 1924   06/04/24 1630  piperacillin -tazobactam (ZOSYN ) IVPB 3.375 g        3.375 g 100 mL/hr over 30 Minutes Intravenous  Once 06/04/24 1622 06/04/24 1720   06/04/24 1615  vancomycin  (VANCOCIN ) 2,000 mg in sodium chloride  0.9 % 500 mL IVPB  Status:  Discontinued        2,000 mg 260 mL/hr over 120 Minutes Intravenous  Once 06/04/24 1611 06/04/24 1622   06/04/24 1615  piperacillin -tazobactam (ZOSYN ) IVPB 4.5 g  Status:  Discontinued        4.5 g 200 mL/hr over 30 Minutes Intravenous  Once 06/04/24 1611 06/04/24 1622         Component Value Date/Time   SDES BLOOD RIGHT ANTECUBITAL 06/05/2024 0110   SPECREQUEST  06/05/2024 0110    BOTTLES DRAWN AEROBIC AND ANAEROBIC Blood Culture adequate volume   CULT  06/05/2024 0110    NO GROWTH 5 DAYS Performed at [facility], [address], [place], [place] [address]    REPTSTATUS 06/10/2024 FINAL 06/05/2024 0110    Procedures:    Mennie LAMY, MD [facility] 06/10/2024, 11:35 AM

## 2024-06-10 NOTE — Consult Note (Signed)
 [facility]    Date of Admission:  06/04/2024   Total days of antibiotics: 7 azithro/vanco/unasyn                Reason for Consult: Diabetic foot infection, L    Referring Provider: Mennie   Assessment: Diabetic Foot infection Osteomyelitis L foot DM2, uncontrolled Poor dentition  Plan: Continue vanco/unasyn  for now Continue azithro to 5 days total Appreciate ortho f/u Diabetic teaching   Comment- Her fever has improved, query if from treating her pna.  Could consider her for po augmentin  and zyvox  at d/c for long term anbx, she will absolutely need to f/u.  With her limited health literacy and compliance I am worried she if she will keep her foot in the long term.    Thank you so much for this interesting consult,  Principal Problem:   Osteomyelitis of left foot (HCC) Active Problems:   Cocaine abuse (HCC)   Diabetic foot infection (HCC)   Hypercapnia   Acute osteomyelitis of metatarsal bone of left foot (HCC)   Cellulitis and abscess of toe of left foot    (feeding supplement) PROSource Plus  30 mL Oral BID WC   acetaminophen   975 mg Oral TID   enoxaparin  (LOVENOX ) injection  40 mg Subcutaneous Daily   feeding supplement (GLUCERNA SHAKE)  237 mL Oral TID BM   furosemide   20 mg Intravenous Daily   insulin  aspart  0-15 Units Subcutaneous TID WC   insulin  aspart  0-5 Units Subcutaneous QHS   insulin  aspart  5 Units Subcutaneous TID WC   insulin  glargine  15 Units Subcutaneous Daily   magnesium  oxide  400 mg Oral BID   multivitamin with minerals  1 tablet Oral Daily   nitroGLYCERIN   0.2 mg Transdermal Daily   nutrition supplement (JUVEN)  1 packet Oral BID WC   pantoprazole   40 mg Oral Daily    HPI: Bianca Yang is a 48 y.o. 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 She took only 4 days of doxy post d/c and  was lost to f/u.  She returns to hospital 12-7 with worsening pain and d/c from her wound. She denies f/c. Her sutures remain in place. Her wbc was 12.3 and she was seen by ortho on adm.  Her ESR was 41 and her CRp was 2.9. She underwent CT of her foot showing osteo of the Left 4th MT and cuboid.  She was started on vanco/unasyn . Her BCx in hospital are (-) and she had fever for the first days of adm, now afebrile since 12-10.  Her course was complicated by hypoxia and hypercapnea in hospital.  She underwent CXR (12-11) and was found to have multifocal pna.   States she quit ETOH/tobacco 1 month ago.  Her cocaine hx is unclear to me.    Review of Systems: Review of Systems  Constitutional:  Negative for chills and fever.  Eyes:  Positive for blurred vision (chronic).  Respiratory:  Negative for cough and shortness of breath.   Gastrointestinal:  Positive for constipation. Negative for diarrhea.  Genitourinary:  Negative for dysuria.  Neurological:  Positive for sensory change.    Past Medical History:  Diagnosis Date   Diabetes mellitus without complication (HCC)    Substance abuse (HCC)     Social  History[1]  Family History  Problem Relation Age of Onset   Diabetes Mother      Medications: I have reviewed the patient's current medications.  Abtx:  Anti-infectives (From admission, onward)    Start     Dose/Rate Route Frequency Ordered Stop   06/08/24 1400  azithromycin  (ZITHROMAX ) 500 mg in sodium chloride  0.9 % 250 mL IVPB        500 mg 250 mL/hr over 60 Minutes Intravenous Every 24 hours 06/08/24 1309 06/11/24 0959   06/05/24 1700  vancomycin  (VANCOCIN ) 1,750 mg in sodium chloride  0.9 % 500 mL IVPB        1,750 mg 258.8 mL/hr over 120 Minutes Intravenous Every 24 hours 06/04/24 1945     06/04/24 2300  Ampicillin -Sulbactam (UNASYN ) 3 g in sodium chloride  0.9 % 100 mL IVPB        3 g 200 mL/hr over 30 Minutes Intravenous Every 6 hours 06/04/24 1945     06/04/24 1630   Vancomycin  (VANCOCIN ) 1,250 mg in sodium chloride  0.9 % 250 mL IVPB        1,250 mg 166.7 mL/hr over 90 Minutes Intravenous  Once 06/04/24 1622 06/04/24 1924   06/04/24 1630  piperacillin -tazobactam (ZOSYN ) IVPB 3.375 g        3.375 g 100 mL/hr over 30 Minutes Intravenous  Once 06/04/24 1622 06/04/24 1720   06/04/24 1615  vancomycin  (VANCOCIN ) 2,000 mg in sodium chloride  0.9 % 500 mL IVPB  Status:  Discontinued        2,000 mg 260 mL/hr over 120 Minutes Intravenous  Once 06/04/24 1611 06/04/24 1622   06/04/24 1615  piperacillin -tazobactam (ZOSYN ) IVPB 4.5 g  Status:  Discontinued        4.5 g 200 mL/hr over 30 Minutes Intravenous  Once 06/04/24 1611 06/04/24 1622         OBJECTIVE: Blood pressure (!) 135/90, pulse 91, temperature 99.3 F (37.4 C), temperature source Oral, resp. rate 19, height 5' 7 (1.702 m), weight 67 kg, SpO2 94%.  Physical Exam Constitutional:      General: She is not in acute distress.    Appearance: Normal appearance. She is obese. She is not toxic-appearing.  HENT:     Mouth/Throat:     Mouth: Mucous membranes are moist.     Pharynx: No oropharyngeal exudate.  Cardiovascular:     Rate and Rhythm: Normal rate and regular rhythm.     Pulses:          Dorsalis pedis pulses are 3+ on the right side and 3+ on the left side.  Pulmonary:     Effort: Pulmonary effort is normal.     Breath sounds: Normal breath sounds.  Abdominal:     General: Bowel sounds are normal. There is no distension.     Palpations: Abdomen is soft.     Tenderness: There is no abdominal tenderness.  Musculoskeletal:     Cervical back: Normal range of motion and neck supple.       Feet:  Feet:     Left foot:     Skin integrity: Skin breakdown present.     Comments: Her wound is open medially. There is no d/c but it is quite tender. Sutures in place.  Neurological:     Mental Status: She is alert.   A picture is available in the media tab  Lab Results Results for orders  placed or performed during the hospital encounter of 06/04/24 (from the past 48 hours)  Glucose, capillary     Status: Abnormal   Collection Time: 06/08/24 11:37 AM  Result Value Ref Range   Glucose-Capillary 166 (H) 70 - 99 mg/dL    Comment: Glucose reference range applies only to samples taken after fasting for at least 8 hours.  Rapid urine drug screen (hospital performed)     Status: Abnormal   Collection Time: 06/08/24  1:20 PM  Result Value Ref Range   Opiates POSITIVE (A) NONE DETECTED   Cocaine NONE DETECTED NONE DETECTED   Benzodiazepines NONE DETECTED NONE DETECTED   Amphetamines NONE DETECTED NONE DETECTED   Tetrahydrocannabinol NONE DETECTED NONE DETECTED   Barbiturates NONE DETECTED NONE DETECTED    Comment: (NOTE) DRUG SCREEN FOR MEDICAL PURPOSES ONLY.  IF CONFIRMATION IS NEEDED FOR ANY PURPOSE, NOTIFY LAB WITHIN 5 DAYS.  LOWEST DETECTABLE LIMITS FOR URINE DRUG SCREEN Drug Class                     Cutoff (ng/mL) Amphetamine and metabolites    1000 Barbiturate and metabolites    200 Benzodiazepine                 200 Opiates and metabolites        300 Cocaine and metabolites        300 THC                            50 Performed at [facility], [address], [place], [place] [address]   Glucose, capillary     Status: Abnormal   Collection Time: 06/08/24  4:02 PM  Result Value Ref Range   Glucose-Capillary 127 (H) 70 - 99 mg/dL    Comment: Glucose reference range applies only to samples taken after fasting for at least 8 hours.  Glucose, capillary     Status: None   Collection Time: 06/08/24 10:47 PM  Result Value Ref Range   Glucose-Capillary 92 70 - 99 mg/dL    Comment: Glucose reference range applies only to samples taken after fasting for at least 8 hours.  CBC     Status: Abnormal   Collection Time: 06/09/24  2:15 AM  Result Value Ref Range   WBC 11.7 (H) 4.0 - 10.5 K/uL   RBC 3.26 (L) 3.87 - 5.11 MIL/uL   Hemoglobin 8.5 (L) 12.0 - 15.0 g/dL    HCT 71.5 (L) 63.9 - 46.0 %   MCV 87.1 80.0 - 100.0 fL   MCH 26.1 26.0 - 34.0 pg   MCHC 29.9 (L) 30.0 - 36.0 g/dL   RDW 86.5 88.4 - 84.4 %   Platelets 215 150 - 400 K/uL   nRBC 0.0 0.0 - 0.2 %    Comment: Performed at [facility], [address], [place], [place] [address]  Basic metabolic panel with GFR     Status: Abnormal   Collection Time: 06/09/24  2:15 AM  Result Value Ref Range   Sodium 135 135 - 145 mmol/L   Potassium 3.8 3.5 - 5.1 mmol/L   Chloride 90 (L) 98 - 111 mmol/L   CO2 39 (H) 22 - 32 mmol/L   Glucose, Bld 134 (H) 70 - 99 mg/dL    Comment: Glucose reference range applies only to samples taken after fasting for at least 8 hours.   BUN 12 6 - 20 mg/dL   Creatinine, Ser 9.50 0.44 - 1.00 mg/dL   Calcium  8.2 (  L) 8.9 - 10.3 mg/dL   GFR, Estimated >39 >39 mL/min    Comment: (NOTE) Calculated using the CKD-EPI Creatinine Equation (2021)    Anion gap 6 5 - 15    Comment: Performed at [facility], [address], [place], [place] [address]  Glucose, capillary     Status: Abnormal   Collection Time: 06/09/24  6:11 AM  Result Value Ref Range   Glucose-Capillary 210 (H) 70 - 99 mg/dL    Comment: Glucose reference range applies only to samples taken after fasting for at least 8 hours.  Glucose, capillary     Status: Abnormal   Collection Time: 06/09/24 11:24 AM  Result Value Ref Range   Glucose-Capillary 208 (H) 70 - 99 mg/dL    Comment: Glucose reference range applies only to samples taken after fasting for at least 8 hours.  Glucose, capillary     Status: Abnormal   Collection Time: 06/09/24  4:11 PM  Result Value Ref Range   Glucose-Capillary 149 (H) 70 - 99 mg/dL    Comment: Glucose reference range applies only to samples taken after fasting for at least 8 hours.  Glucose, capillary     Status: Abnormal   Collection Time: 06/09/24  8:48 PM  Result Value Ref Range   Glucose-Capillary 161 (H) 70 - 99 mg/dL    Comment: Glucose reference range applies  only to samples taken after fasting for at least 8 hours.  Basic metabolic panel with GFR     Status: Abnormal   Collection Time: 06/10/24  2:13 AM  Result Value Ref Range   Sodium 133 (L) 135 - 145 mmol/L   Potassium 4.5 3.5 - 5.1 mmol/L   Chloride 91 (L) 98 - 111 mmol/L   CO2 37 (H) 22 - 32 mmol/L   Glucose, Bld 264 (H) 70 - 99 mg/dL    Comment: Glucose reference range applies only to samples taken after fasting for at least 8 hours.   BUN 18 6 - 20 mg/dL   Creatinine, Ser 9.40 0.44 - 1.00 mg/dL   Calcium  8.4 (L) 8.9 - 10.3 mg/dL   GFR, Estimated >39 >39 mL/min    Comment: (NOTE) Calculated using the CKD-EPI Creatinine Equation (2021)    Anion gap 5 5 - 15    Comment: Performed at [facility], [address], [place], [place] [address]  CBC     Status: Abnormal   Collection Time: 06/10/24  2:13 AM  Result Value Ref Range   WBC 10.9 (H) 4.0 - 10.5 K/uL   RBC 3.30 (L) 3.87 - 5.11 MIL/uL   Hemoglobin 8.8 (L) 12.0 - 15.0 g/dL   HCT 70.9 (L) 63.9 - 53.9 %   MCV 87.9 80.0 - 100.0 fL   MCH 26.7 26.0 - 34.0 pg   MCHC 30.3 30.0 - 36.0 g/dL   RDW 86.2 88.4 - 84.4 %   Platelets 253 150 - 400 K/uL   nRBC 0.0 0.0 - 0.2 %    Comment: Performed at [facility], [address], [place], [place] [address]  Glucose, capillary     Status: Abnormal   Collection Time: 06/10/24  6:02 AM  Result Value Ref Range   Glucose-Capillary 204 (H) 70 - 99 mg/dL    Comment: Glucose reference range applies only to samples taken after fasting for at least 8 hours.  Reticulocytes     Status: Abnormal   Collection Time: 06/10/24  8:16 AM  Result  Value Ref Range   Retic Ct Pct 1.5 0.4 - 3.1 %   RBC. 3.27 (L) 3.87 - 5.11 MIL/uL   Retic Count, Absolute 50.0 19.0 - 186.0 K/uL   Immature Retic Fract 18.9 (H) 2.3 - 15.9 %    Comment: Performed at [facility], [address] [place], [place] [address]  Folate     Status: None   Collection Time: 06/10/24  8:17 AM  Result Value Ref Range    Folate 13.8 >5.9 ng/mL    Comment: Performed at [facility], [address] [place], [place] [address]      Component Value Date/Time   SDES BLOOD RIGHT ANTECUBITAL 06/05/2024 0110   SPECREQUEST  06/05/2024 0110    BOTTLES DRAWN AEROBIC AND ANAEROBIC Blood Culture adequate volume   CULT  06/05/2024 0110    NO GROWTH 5 DAYS Performed at [facility], [address], [place], [place] [address]    REPTSTATUS 06/10/2024 FINAL 06/05/2024 0110   ECHOCARDIOGRAM COMPLETE Result Date: 06/08/2024    ECHOCARDIOGRAM REPORT   Patient Name:   Bianca Yang Date of Exam: 06/08/2024 Medical Rec #:  [id number]         Height:       67.0 in Accession #:    [id number]        Weight:       147.7 lb Date of Birth:  07-12-1975         BSA:          1.778 m Patient Age:    48 years          BP:           117/69 mmHg Patient Gender: F                 HR:           70 bpm. Exam Location:  Inpatient Procedure: 2D Echo, Color Doppler and Cardiac Doppler (Both Spectral and Color            Flow Doppler were utilized during procedure). Indications:    Pulmonary hypertension  History:        Patient has no prior history of Echocardiogram examinations.  Sonographer:    Juliene Rucks Referring Phys: [id number] RAMESH KC  Sonographer Comments: Image acquisition challenging due to uncooperative patient and Image acquisition challenging due to patient behavioral factors. IMPRESSIONS  1. Abnormal septal motion Inferior basal hypokinesis . Left ventricular ejection fraction, by estimation, is 45 to 50%. The left ventricle has mildly decreased function. The left ventricle has no regional wall motion abnormalities. Left ventricular diastolic parameters are indeterminate.  2. Right ventricular systolic function is mildly reduced. The right ventricular size is mildly enlarged.  3. The mitral valve is abnormal. Trivial mitral valve regurgitation. No evidence of mitral stenosis.  4. The aortic valve is tricuspid. Aortic valve  regurgitation is not visualized. No aortic stenosis is present.  5. The inferior vena cava is dilated in size with >50% respiratory variability, suggesting right atrial pressure of 8 mmHg. FINDINGS  Left Ventricle: Abnormal septal motion Inferior basal hypokinesis. Left ventricular ejection fraction, by estimation, is 45 to 50%. The left ventricle has mildly decreased function. The left ventricle has no regional wall motion abnormalities. Strain was performed and the global longitudinal strain is indeterminate. The left ventricular internal cavity size was normal in size. There is no left ventricular hypertrophy. Left ventricular diastolic parameters are indeterminate. Right Ventricle: The  right ventricular size is mildly enlarged. No increase in right ventricular wall thickness. Right ventricular systolic function is mildly reduced. Left Atrium: Left atrial size was normal in size. Right Atrium: Right atrial size was normal in size. Pericardium: Trivial pericardial effusion is present. The pericardial effusion is lateral to the left ventricle. Mitral Valve: The mitral valve is abnormal. There is mild thickening of the mitral valve leaflet(s). Trivial mitral valve regurgitation. No evidence of mitral valve stenosis. Tricuspid Valve: The tricuspid valve is normal in structure. Tricuspid valve regurgitation is mild . No evidence of tricuspid stenosis. Aortic Valve: The aortic valve is tricuspid. Aortic valve regurgitation is not visualized. No aortic stenosis is present. Pulmonic Valve: The pulmonic valve was normal in structure. Pulmonic valve regurgitation is trivial. No evidence of pulmonic stenosis. Aorta: The aortic root is normal in size and structure. Venous: The inferior vena cava is dilated in size with greater than 50% respiratory variability, suggesting right atrial pressure of 8 mmHg. IAS/Shunts: No atrial level shunt detected by color flow Doppler. Additional Comments: 3D was performed not requiring image  post processing on an independent workstation and was indeterminate.   LV Volumes (MOD) LV vol d, MOD A4C: 176.0 ml LV vol s, MOD A4C: 94.8 ml LV SV MOD A4C:     176.0 ml RIGHT VENTRICLE             IVC RV S prime:     18.60 cm/s  IVC diam: 2.20 cm TAPSE (M-mode): 2.4 cm LEFT ATRIUM           Index LA Vol (A4C): 38.1 ml 21.43 ml/m  AORTIC VALVE LVOT Vmax:   82.50 cm/s LVOT Vmean:  55.500 cm/s LVOT VTI:    0.158 m MITRAL VALVE MV Area (PHT): 5.13 cm    SHUNTS MV Decel Time: 148 msec    Systemic VTI: 0.16 m MV E velocity: 79.20 cm/s MV A velocity: 80.50 cm/s MV E/A ratio:  0.98 Maude Emmer MD Electronically signed by Maude Emmer MD Signature Date/Time: 06/08/2024/4:09:06 PM    Final    Recent Results (from the past 240 hours)  Blood culture (routine x 2)     Status: None   Collection Time: 06/04/24  1:51 PM   Specimen: BLOOD  Result Value Ref Range Status   Specimen Description BLOOD SITE NOT SPECIFIED  Final   Special Requests   Final    BOTTLES DRAWN AEROBIC AND ANAEROBIC Blood Culture adequate volume   Culture   Final    NO GROWTH 5 DAYS Performed at [facility], [address], [place], [place] [address]    Report Status 06/09/2024 FINAL  Final  Blood culture (routine x 2)     Status: None   Collection Time: 06/05/24  1:10 AM   Specimen: BLOOD  Result Value Ref Range Status   Specimen Description BLOOD RIGHT ANTECUBITAL  Final   Special Requests   Final    BOTTLES DRAWN AEROBIC AND ANAEROBIC Blood Culture adequate volume   Culture   Final    NO GROWTH 5 DAYS Performed at [facility], [address], [place], [place] [address]    Report Status 06/10/2024 FINAL  Final    Microbiology: Recent Results (from the past 240 hours)  Blood culture (routine x 2)     Status: None   Collection Time: 06/04/24  1:51 PM   Specimen: BLOOD  Result Value Ref Range Status   Specimen Description BLOOD SITE NOT SPECIFIED  Final   Special Requests   Final    BOTTLES DRAWN AEROBIC  AND ANAEROBIC Blood Culture adequate volume   Culture   Final    NO GROWTH 5 DAYS Performed at [facility], [address] [place], [place] [address]    Report Status 06/09/2024 FINAL  Final  Blood culture (routine x 2)     Status: None   Collection Time: 06/05/24  1:10 AM   Specimen: BLOOD  Result Value Ref Range Status   Specimen Description BLOOD RIGHT ANTECUBITAL  Final   Special Requests   Final    BOTTLES DRAWN AEROBIC AND ANAEROBIC Blood Culture adequate volume   Culture   Final    NO GROWTH 5 DAYS Performed at [facility], [address], [place], [place] [address]    Report Status 06/10/2024 FINAL  Final    Radiographs and labs were personally reviewed by me.   Reyes Fenton, MD [facility] [phone] 06/10/2024, 10:14 AM     [1]  Social History Tobacco Use   Smoking status: Former    Current packs/day: 0.50    Types: Cigarettes   Smokeless tobacco: Never  Substance Use Topics   Alcohol use: Not Currently    Comment: socially   Drug use: No

## 2024-06-10 NOTE — Progress Notes (Signed)
 Occupational Therapy Treatment Patient Details Name: Bianca Yang MRN: [id number] DOB: 11-19-1975 Today's Date: 06/10/2024   History of present illness Pt is a 48 y.o. F presenting to [facility] on 06/04/24 with L foot pain and swelling following L foot 5th ray amputation on 05/05/24. PMH is significant for DM, cocaine abuse.   OT comments  Pt. Seen for skilled OT treatment session with sister and dtr present for duration of session. (Father present but stepped out during session to allow pt. Privacy during mobility).  Pt. Able to complete bed mobility with CGA. LB dressing seated with cues for compensatory tech. Pt. Completed multiple sit/stand with CGA and cues for maintaining NWB status LLE.  Stand pivot to/from bsc with cont. Cues for maintaining precautions and slowing the pace of movement with MIN A. Able to maintain NWB throughout the transfers with MAX cues.  Education, demo, return demo of PLB.  Pt. Able to return demo but requires cues for the exhale blow portion.  Able to rebound o2 levels once committed to the PLB but required constant cues for all attempts.  O2 fluctuating 83% then rebound to 93%.    Pts. Sister and father with several d/c questions and concerns.  Secure chat with SW/RN CM to pass along the questions.  SW to stop by and meet with the family.        If plan is discharge home, recommend the following:  A little help with walking and/or transfers;A little help with bathing/dressing/bathroom;Assistance with cooking/housework;Assist for transportation;Help with stairs or ramp for entrance   Equipment Recommendations       Recommendations for Other Services      Precautions / Restrictions Precautions Precautions: Fall Restrictions LLE Weight Bearing Per Provider Order: Non weight bearing Other Position/Activity Restrictions: Pt impulsive with tendency to WB through LLE despite cueing and constant reinforcement not to.       Mobility Bed Mobility Overal bed  mobility: CGA secondary to multiple lines and pt.s impulsivity Bed Mobility: Supine to Sit, Sit to Supine                Transfers Overall transfer level: Needs assistance Equipment used: Rolling walker (2 wheels) Transfers: Sit to/from Stand, Bed to chair/wheelchair/BSC Sit to Stand: Contact guard assist Stand pivot transfers: Min assist         General transfer comment: pt. seated eob able to transfer to L to bsc and back to R to eob with MIN A and MAX cues for slowing pace and maintaing NWB status     Balance                                           ADL either performed or assessed with clinical judgement   ADL Overall ADL's : Needs assistance/impaired                     Lower Body Dressing: Set up;Sitting/lateral leans;Cueing for safety Lower Body Dressing Details (indicate cue type and reason): able to figure four to access RLE for adj. sock-max cues for safety due to impulsive tendancies Toilet Transfer: Minimal assistance;Cueing for sequencing;Stand-pivot;BSC/3in1;Rolling walker (2 wheels) Toilet Transfer Details (indicate cue type and reason): max cues to slow pace and for sequencing but pt. able to complete stand pivot while maintaining NWB.  cont. to sit w/o reaching back for arm rests despite max cues  Functional mobility during ADLs: Minimal assistance;Cueing for safety;Cueing for sequencing;Rolling walker (2 wheels) General ADL Comments: able to complete tasks but requires max cues for safety, sequencing, and control of pace    Extremity/Trunk Assessment              Vision       Perception     Praxis     Communication Communication Communication: No apparent difficulties   Cognition Arousal: Alert Behavior During Therapy: Impulsive, Restless               OT - Cognition Comments: Pt grossly A/O, impulsive with decreased safety awareness, restless.                 Following commands:  Impaired Following commands impaired: Follows multi-step commands inconsistently      Cueing   Cueing Techniques: Verbal cues, Gestural cues  Exercises      Shoulder Instructions       General Comments      Pertinent Vitals/ Pain       Pain Assessment Pain Assessment: No/denies pain  Home Living                                          Prior Functioning/Environment              Frequency  Min 2X/week        Progress Toward Goals  OT Goals(current goals can now be found in the care plan section)  Progress towards OT goals: Progressing toward goals     Plan      Co-evaluation                 AM-PAC OT 6 Clicks Daily Activity     Outcome Measure   Help from another person eating meals?: None Help from another person taking care of personal grooming?: A Little Help from another person toileting, which includes using toliet, bedpan, or urinal?: A Little Help from another person bathing (including washing, rinsing, drying)?: A Little Help from another person to put on and taking off regular upper body clothing?: A Little Help from another person to put on and taking off regular lower body clothing?: A Little 6 Click Score: 19    End of Session Equipment Utilized During Treatment: Gait belt;Rolling walker (2 wheels)  OT Visit Diagnosis: Unsteadiness on feet (R26.81);Other abnormalities of gait and mobility (R26.89);Pain;Other symptoms and signs involving cognitive function Pain - Right/Left: Left Pain - part of body: Ankle and joints of foot;Leg   Activity Tolerance Patient tolerated treatment well   Patient Left in bed;with call bell/phone within reach;with bed alarm set;with family/visitor present   Nurse Communication Other (comment) (rn states ok to work with pt., notified per pt. request for rn to check L foot wrap pt. complaint it is too tight)        Time: 9079-9046 OT Time Calculation (min): 33 min  Charges: OT  General Charges $OT Visit: 1 Visit OT Treatments $Self Care/Home Management : 23-37 mins  Randall, COTA/L Acute Rehabilitation [phone]   CHRISTELLA Nest Lorraine-COTA/L  06/10/2024, 10:19 AM

## 2024-06-11 DIAGNOSIS — M86172 Other acute osteomyelitis, left ankle and foot: Secondary | ICD-10-CM | POA: Diagnosis not present

## 2024-06-11 LAB — GLUCOSE, CAPILLARY
Glucose-Capillary: 296 mg/dL — ABNORMAL HIGH (ref 70–99)
Glucose-Capillary: 105 mg/dL — ABNORMAL HIGH (ref 70–99)
Glucose-Capillary: 230 mg/dL — ABNORMAL HIGH (ref 70–99)
Glucose-Capillary: 174 mg/dL — ABNORMAL HIGH (ref 70–99)

## 2024-06-11 LAB — BASIC METABOLIC PANEL WITH GFR
Anion gap: 7 (ref 5–15)
BUN: 8 mg/dL (ref 6–20)
CO2: 40 mmol/L — ABNORMAL HIGH (ref 22–32)
Calcium: 8.6 mg/dL — ABNORMAL LOW (ref 8.9–10.3)
Chloride: 92 mmol/L — ABNORMAL LOW (ref 98–111)
Creatinine, Ser: 0.54 mg/dL (ref 0.44–1.00)
GFR, Estimated: >60 mL/min (ref >60)
Glucose, Bld: 67 mg/dL — ABNORMAL LOW (ref 70–99)
Potassium: 3.6 mmol/L (ref 3.5–5.1)
Sodium: 139 mmol/L (ref 135–145)

## 2024-06-11 MED ORDER — INSULIN ASPART 100 UNIT/ML IJ SOLN
0.0000 [IU] | Freq: Three times a day (TID) | INTRAMUSCULAR | Status: DC
  Administered 2024-06-11: 3 [IU] via SUBCUTANEOUS
  Administered 2024-06-12: 18:00:00 1 [IU] via SUBCUTANEOUS
  Administered 2024-06-12 (×2): 2 [IU] via SUBCUTANEOUS
  Administered 2024-06-13: 13:00:00 3 [IU] via SUBCUTANEOUS
  Administered 2024-06-13: 07:00:00 7 [IU] via SUBCUTANEOUS
  Administered 2024-06-14 (×2): 2 [IU] via SUBCUTANEOUS
  Administered 2024-06-14: 07:00:00 9 [IU] via SUBCUTANEOUS
  Administered 2024-06-15 (×2): 1 [IU] via SUBCUTANEOUS
  Administered 2024-06-15: 08:00:00 3 [IU] via SUBCUTANEOUS
  Administered 2024-06-16: 7 [IU] via SUBCUTANEOUS
  Administered 2024-06-16 – 2024-06-17 (×2): 2 [IU] via SUBCUTANEOUS
  Administered 2024-06-17: 9 [IU] via SUBCUTANEOUS
  Administered 2024-06-18: 7 [IU] via SUBCUTANEOUS
  Administered 2024-06-18 (×2): 2 [IU] via SUBCUTANEOUS
  Administered 2024-06-19: 5 [IU] via SUBCUTANEOUS
  Administered 2024-06-19: 2 [IU] via SUBCUTANEOUS
  Administered 2024-06-20: 5 [IU] via SUBCUTANEOUS
  Administered 2024-06-20: 1 [IU] via SUBCUTANEOUS
  Administered 2024-06-20 – 2024-06-21 (×3): 2 [IU] via SUBCUTANEOUS
  Administered 2024-06-21: 5 [IU] via SUBCUTANEOUS
  Administered 2024-06-22 (×2): 2 [IU] via SUBCUTANEOUS
  Administered 2024-06-22: 3 [IU] via SUBCUTANEOUS
  Administered 2024-06-23: 2 [IU] via SUBCUTANEOUS
  Filled 2024-06-11 (×3): qty 2
  Filled 2024-06-11: qty 5
  Filled 2024-06-11: qty 2
  Filled 2024-06-11: qty 1
  Filled 2024-06-11 (×3): qty 2
  Filled 2024-06-11 (×2): qty 7
  Filled 2024-06-11: qty 2
  Filled 2024-06-11: qty 7
  Filled 2024-06-11: qty 1
  Filled 2024-06-11: qty 2
  Filled 2024-06-11: qty 3
  Filled 2024-06-11: qty 1
  Filled 2024-06-11: qty 3
  Filled 2024-06-11: qty 2
  Filled 2024-06-11: qty 5
  Filled 2024-06-11: qty 3
  Filled 2024-06-11: qty 2
  Filled 2024-06-11: qty 5
  Filled 2024-06-11: qty 7
  Filled 2024-06-11 (×2): qty 9

## 2024-06-11 NOTE — Progress Notes (Signed)
 PROGRESS NOTE Bianca Yang  FMW:[id number] DOB: 22-Jun-1976 DOA: 06/04/2024 PCP: Patient, [name] Per  Brief Narrative/Hospital Course: Bianca Yang is a 48 y.o. female with past medical history significant of DM2, diabetic foot ulcer, OSA, PAD presented to hospital with left foot pain and swelling.  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  In the ED patient was afebrile.  Labs were notable for mild leukocytosis but lactate was within normal range.  BMP showed mild hyponatremia with sodium of 131 with a glucose elevation at 483.  ESR was elevated at 41 with CRP of 2.9.  X-ray of the left foot showed large lateral midfoot ulceration with lytic destruction of the cuboid and proximal base of the fourth metatarsal suspicious osteomyelitis.  CT scan of the foot was then performed which showed acute osteomyelitis involving the fourth metatarsal and cuboid.  Patient was then considered for admission to the hospital for further evaluation and treatment Patient continued on IV antibiotics.  Also noted to be hypercapnic and hypoxic needing HFNC 12/11: Seen by PCCM  Subjective: Seen and examined  Complains of being cold Overnight he remains afebrile still on 8 L nasal cannula and trying to wean down Blood sugar 130s-290s> 67 on BMP recheck ordered  Assessment and plan:  Diabetic foot infection with question osteomyelitis of the left foot metatarsal, wound dehiscence: Inflammation marker elevated CRP ESR.  Imaging CT scan and x-ray of the foot suggestive of acute osteomyelitis.ABI normal . Dr. Harden advised NWB LLE- stated there is bone marrow edema with no plans for surgical intervention at this time. Patient remains on broad-spectrum antibiotics and hopefully transition to p.o. and arrange outpatient follow-up w/ NWB Blood culture remains sterile.Continue to optimize blood  sugar.   Patient has had fever of 102 F 12/10-likely from pneumonia and has been afebrile since and WBC improved Had a fall 12/10 while trying to go to the bathroom.Encouraged and educated on NWB. ID input appreciated- cont Vanco/Unasyn  till discharge with plan for Augmentin  and Zyvox  x 6 weeks follow-up with ID clinic.   Dr. Harden continues to follow-up on the wound.  Reported obstructive sleep apnea/OSA Acute on chronic hypoxia and hypercapnea-noncompliant to CPAP Acute mild systolic dysfunction: Patient with history of sleep apnea (?) not compliant with CPAP-adamantly refusing here. VBG in the ED-pH 7.38 pCO2 68 pO2 52 showed evidence of hypercapnia VBG on recheck showed similar hypercapnia, compensated, still needing nasal cannula and trying to wean down. PCCM was consulted-suspect hypoxia driven by untreated OSA as well as hypoventilation somnolence, continue on CAP coverage for multifocal pneumonia continue bronchodilators supplemental oxygen and CPAP bedtime if she allows  TTE- obtained: EF 45-50% -previous echo to compare has mildly decreased LV function no RWMA RV SF mildly reduced RV size mildly enlarged mitral valve is abnormal-mild MR, no evidence of stenosis.  No evidence of fluid overload BNP 208. She will need outpatient further follow-up with cardiology.  She denies any chest pain Will cont on iv lasix  to wean o2  MultifocaL Pneumonia Tobacco abuse-quit recently: CXR  12/11-Diffuse interstitial lung opacity with clustered micro nodularity  in RML, rt  base LLL-suggest multifocal pneumonia  Continue vancomycin  and Unasyn  azithromycin  added 12/11 seen by PCCM continue supplemental oxygen, Urine antigens ordered unable to be collected. She smoked and quit a month ago. Less than pack a day x 28 yrs  DM type 2 with uncontrolled hyperglycemia: Latest hemoglobin A1c of 11.6 a month.  Continue current lantus15u a,ssi and add premela 5 u tid if eating > 50% of meal  Patient has high  chances of noncompliance with diet and medication.  Changed insulin  to 0-9 SSI-due to slightly low blood sugar. Glimepiride  and metformin , consult diabetes continue to [place] likely needs to go home with insulin  Recent Labs  Lab 06/10/24 1138 06/10/24 1626 06/10/24 2144 06/11/24 0634 06/11/24 1120  GLUCAP 175* 283* 136* 296* 105*    Hyponatremia: likely from hyperglycemia, monitor  Social issues: Patient might need more support at home. toc consulted.  Hypomagnesemia: Improved  Hypoalbuminemia: Augment diet RD following  Normocytic anemia: Some drop in hemoglobin holding stable -although on admission in 12. Checked anemia panel. Recent Labs    06/05/24 0110 06/07/24 0218 06/08/24 0218 06/09/24 0215 06/10/24 0213 06/10/24 0816 06/10/24 0817 06/10/24 0914  HGB 11.7* 9.7* 9.3* 8.5* 8.8*  --   --   --   MCV 86.5 86.8 89.1 87.1 87.9  --   --   --   VITAMINB12  --   --   --   --   --   --   --  332  FOLATE  --   --   --   --   --   --  13.8  --   FERRITIN  --   --   --   --   --   --   --  133  TIBC  --   --   --   --   --   --   --  230*  IRON  --   --   --   --   --   --   --  21*  RETICCTPCT  --   --   --   --   --  1.5  --   --    Substance abuse History of cocaine abuse Hx of crack cocaine use, + a month ago-  UDS positive for opiates Patient appears much older than her stated age.  She is residing at her father's house who is taking care of  Mobility: PT Orders: Active PT Follow up Rec: Skilled Nursing-Short Term Rehab (<3 Hours/Day)06/09/2024 1500   DVT prophylaxis: enoxaparin  (LOVENOX ) injection 40 mg Start: 06/08/24 1100 SCDs Start: 06/08/24 1046 SCDs Start: 06/04/24 1922  Code Status:   Code Status: Full Code Family Communication: plan of care discussed with patient at bedside.  Father was updated at the bedside previously, patient declined my offer to call him today. Patient status is: Remains hospitalized because of severity of illness Level of care:  Progressive   Dispo: The patient is from: HOME            Anticipated disposition: TBD .  Per TOC no SNF willing to accept her.  Patient and her father planning to take her home with home health  Objective: Vitals last 24 hrs: Vitals:   06/11/24 0005 06/11/24 0412 06/11/24 0855 06/11/24 0900  BP: 133/81 138/74  (!) 156/73  Pulse: 77 83 83   Resp: 18 18 17 18   Temp: 98 F (36.7 C) (!) 97.5 F (36.4 C)    TempSrc: Oral Oral  Oral  SpO2: 90% 92% 91%   Weight:      Height:        Physical Examination: General exam: AAOX3, not in distress HEENT:Oral mucosa moist, Ear/Nose WNL grossly Respiratory system: CTA b/l  Cardiovascular system: S1 & S2 +, No JVD. Gastrointestinal system: Abd soft,NT,ND, BS+ Nervous System: Alert, awake, moving all extremities,and following commands. Extremities: extremities warm, leg edema neg , left foot dressing + warm perfused Skin: Warm, no rashes MSK: Normal muscle bulk,tone, power   Medications reviewed:  Scheduled Meds:  (feeding supplement) PROSource Plus  30 mL Oral BID WC   acetaminophen   975 mg Oral TID   enoxaparin  (LOVENOX ) injection  40 mg Subcutaneous Daily   feeding supplement (GLUCERNA SHAKE)  237 mL Oral TID BM   furosemide   20 mg Intravenous Daily   insulin  aspart  0-5 Units Subcutaneous QHS   insulin  aspart  0-9 Units Subcutaneous TID WC   insulin  aspart  5 Units Subcutaneous TID WC   insulin  glargine  15 Units Subcutaneous Daily   magnesium  oxide  400 mg Oral BID   multivitamin with minerals  1 tablet Oral Daily   nitroGLYCERIN   0.2 mg Transdermal Daily   nutrition supplement (JUVEN)  1 packet Oral BID WC   pantoprazole   40 mg Oral Daily   Continuous Infusions:  ampicillin -sulbactam (UNASYN ) IV 3 g (06/11/24 0600)   vancomycin  1,750 mg (06/10/24 1736)   Diet: Diet Order             Diet Carb Modified Fluid consistency: Thin; Room service appropriate? Yes  Diet effective now                    Data Reviewed: I have  personally reviewed following labs and imaging studies ( see epic result tab) CBC: Recent Labs  Lab 06/04/24 1348 06/04/24 1648 06/05/24 0110 06/07/24 0218 06/08/24 0218 06/09/24 0215 06/10/24 0213  WBC 12.3*  --  12.2* 14.2* 13.4* 11.7* 10.9*  NEUTROABS 9.3*  --   --  10.5* 10.2*  --   --   HGB 12.8   < > 11.7* 9.7* 9.3* 8.5* 8.8*  HCT 41.7   < > 38.3 32.8* 30.4* 28.4* 29.0*  MCV 85.3  --  86.5 86.8 89.1 87.1 87.9  PLT 240  --  213 193 193 215 253   < > = values in this interval not displayed.   CMP: Recent Labs  Lab 06/05/24 0110 06/07/24 0218 06/08/24 0218 06/09/24 0215 06/10/24 0213 06/11/24 1009  NA 138 133* 131* 135 133* 139  K 4.9 4.4 4.5 3.8 4.5 3.6  CL 94* 90* 88* 90* 91* 92*  CO2 37* 37* 38* 39* 37* 40*  GLUCOSE 153* 298* 339* 134* 264* 67*  BUN 17 28* 22* 12 18 8   CREATININE 0.67 0.72 0.44 0.49 0.59 0.54  CALCIUM  9.1 8.3* 8.2* 8.2* 8.4* 8.6*  MG 1.4*  --  1.7  --   --   --   PHOS 4.2  --   --   --   --   --    GFR: Estimated Creatinine Clearance: 82.7 mL/min (by C-G formula based on SCr of 0.54 mg/dL). Recent Labs  Lab 06/04/24 1348 06/05/24 0110  AST 15 13*  ALT 14 12  ALKPHOS 131* 128*  BILITOT 0.3 0.7  PROT 8.5* 8.1  ALBUMIN 3.0* 2.9*   No results for input(s): LIPASE, AMYLASE in the last 168 hours. No results for input(s): AMMONIA in the last 168 hours. Coagulation Profile: No results for input(s): INR, PROTIME in the last 168 hours. Unresulted Labs (From admission, onward)     Start     Ordered   06/15/24 0500  Creatinine, serum  (enoxaparin  (  LOVENOX )    CrCl >/= 30 ml/min)  Weekly,   R     Comments: while on enoxaparin  therapy   Question:  Specimen collection method  Answer:  Lab=Lab collect   06/08/24 1045   06/12/24 0500  Basic metabolic panel with GFR  Tomorrow morning,   R       Question:  Specimen collection method  Answer:  Lab=Lab collect   06/11/24 0832   06/12/24 0500  CBC  Tomorrow morning,   R       Question:   Specimen collection method  Answer:  Lab=Lab collect   06/11/24 0832   06/08/24 1309  Expectorated Sputum Assessment w Gram Stain, Rflx to Resp Cult  Once,   R        06/08/24 1308   06/08/24 1308  Legionella Pneumophila Serogp 1 Ur Ag  Once,   R        06/08/24 1308   06/08/24 1308  Strep pneumoniae urinary antigen  Once,   R        06/08/24 1308           Antimicrobials/Microbiology: Anti-infectives (From admission, onward)    Start     Dose/Rate Route Frequency Ordered Stop   06/08/24 1400  azithromycin  (ZITHROMAX ) 500 mg in sodium chloride  0.9 % 250 mL IVPB        500 mg 250 mL/hr over 60 Minutes Intravenous Every 24 hours 06/08/24 1309 06/10/24 1328   06/05/24 1700  vancomycin  (VANCOCIN ) 1,750 mg in sodium chloride  0.9 % 500 mL IVPB        1,750 mg 258.8 mL/hr over 120 Minutes Intravenous Every 24 hours 06/04/24 1945     06/04/24 2300  Ampicillin -Sulbactam (UNASYN ) 3 g in sodium chloride  0.9 % 100 mL IVPB        3 g 200 mL/hr over 30 Minutes Intravenous Every 6 hours 06/04/24 1945     06/04/24 1630  Vancomycin  (VANCOCIN ) 1,250 mg in sodium chloride  0.9 % 250 mL IVPB        1,250 mg 166.7 mL/hr over 90 Minutes Intravenous  Once 06/04/24 1622 06/04/24 1924   06/04/24 1630  piperacillin -tazobactam (ZOSYN ) IVPB 3.375 g        3.375 g 100 mL/hr over 30 Minutes Intravenous  Once 06/04/24 1622 06/04/24 1720   06/04/24 1615  vancomycin  (VANCOCIN ) 2,000 mg in sodium chloride  0.9 % 500 mL IVPB  Status:  Discontinued        2,000 mg 260 mL/hr over 120 Minutes Intravenous  Once 06/04/24 1611 06/04/24 1622   06/04/24 1615  piperacillin -tazobactam (ZOSYN ) IVPB 4.5 g  Status:  Discontinued        4.5 g 200 mL/hr over 30 Minutes Intravenous  Once 06/04/24 1611 06/04/24 1622         Component Value Date/Time   SDES BLOOD RIGHT ANTECUBITAL 06/05/2024 0110   SPECREQUEST  06/05/2024 0110    BOTTLES DRAWN AEROBIC AND ANAEROBIC Blood Culture adequate volume   CULT  06/05/2024 0110     NO GROWTH 5 DAYS Performed at [facility], [address] [place], [place] [address]    REPTSTATUS 06/10/2024 FINAL 06/05/2024 0110    Procedures:    Mennie LAMY, MD [facility] 06/11/2024, 11:26 AM

## 2024-06-11 NOTE — Progress Notes (Addendum)
°    [facility]    Date of Admission:  06/04/2024   Total days of antibiotics 8        Vanco/unasyn , azithro (4)           ID: Bianca Yang is a 48 y.o. female with   Principal Problem:   Osteomyelitis of left foot (HCC) Active Problems:   Cocaine abuse (HCC)   Diabetic foot infection (HCC)   Hypercapnia   Acute osteomyelitis of metatarsal bone of left foot (HCC)   Cellulitis and abscess of toe of left foot    Subjective: Resting quietly  Medications:   (feeding supplement) PROSource Plus  30 mL Oral BID WC   acetaminophen   975 mg Oral TID   enoxaparin  (LOVENOX ) injection  40 mg Subcutaneous Daily   feeding supplement (GLUCERNA SHAKE)  237 mL Oral TID BM   furosemide   20 mg Intravenous Daily   insulin  aspart  0-15 Units Subcutaneous TID WC   insulin  aspart  0-5 Units Subcutaneous QHS   insulin  aspart  5 Units Subcutaneous TID WC   insulin  glargine  15 Units Subcutaneous Daily   magnesium  oxide  400 mg Oral BID   multivitamin with minerals  1 tablet Oral Daily   nitroGLYCERIN   0.2 mg Transdermal Daily   nutrition supplement (JUVEN)  1 packet Oral BID WC   pantoprazole   40 mg Oral Daily    Objective: Vital signs in last 24 hours: Temp:  [97.5 F (36.4 C)-99.1 F (37.3 C)] 97.5 F (36.4 C) (12/14 0412) Pulse Rate:  [74-86] 83 (12/14 0855) Resp:  [17-20] 17 (12/14 0855) BP: (112-150)/(69-87) 138/74 (12/14 0412) SpO2:  [90 %-94 %] 91 % (12/14 0855)   General appearance: fatigued and no distress Incision/Wound: LLE dressed.   Lab Results Recent Labs    06/09/24 0215 06/10/24 0213  WBC 11.7* 10.9*  HGB 8.5* 8.8*  HCT 28.4* 29.0*  NA 135 133*  K 3.8 4.5  CL 90* 91*  CO2 39* 37*  BUN 12 18  CREATININE 0.49 0.59   Liver Panel No results for input(s): PROT, ALBUMIN, AST, ALT, ALKPHOS, BILITOT, BILIDIR, IBILI in the last 72 hours. Sedimentation Rate No results for input(s): ESRSEDRATE in the last 72  hours. C-Reactive Protein No results for input(s): CRP in the last 72 hours.  Microbiology:  Studies/Results: No results found.   Assessment/Plan: Diabetic Foot infection Osteomyelitis L foot DM2, uncontrolled Poor dentition   Glc variable 149-296 Complete azithro tomorrow Continue vanco/unasyn  til d/c (no cx to guide) then onto augmentin /zyvox  for 6 weeks.  She can f/u in ID clinic [phone]) if needed.  Has she had vascular w/u (ABI)? Available as needed.   Reyes Fenton Internal Medicine Teaching Service/Infectious Disease Pager: [phone]  06/11/2024, 9:19 AM

## 2024-06-11 NOTE — Progress Notes (Signed)
°   06/11/24 2034  BiPAP/CPAP/SIPAP  Reason BIPAP/CPAP not in use Non-compliant (patient states wish not to wear)

## 2024-06-11 NOTE — Progress Notes (Signed)
 Pharmacy Antibiotic Note  Bianca Yang is a 48 y.o. female for which pharmacy has been consulted for ampicillin -sulbactam and vancomycin  dosing for wound infection.  Patient with a history of T2DM, diabetic foot ulcer, OSA, PAD. Patient presenting with left foot pain.  SCr 0.5  Plan: Continue Unasyn  IV 3 g q6hr Continue vancomycin  IV 1750 mg q24hr (eAUC 496.5)  Monitor WBC, fever, renal function, cultures De-escalate when able Levels at steady state  Height: 5' 7 (170.2 cm) Weight: 67 kg (147 lb 11.3 oz) IBW/kg (Calculated) : 61.6  Temp (24hrs), Avg:98.3 F (36.8 C), Min:97.5 F (36.4 C), Max:99.1 F (37.3 C)  Recent Labs  Lab 06/04/24 1409 06/04/24 2118 06/05/24 0110 06/07/24 0218 06/08/24 0218 06/09/24 0215 06/10/24 0213  WBC  --   --  12.2* 14.2* 13.4* 11.7* 10.9*  CREATININE  --   --  0.67 0.72 0.44 0.49 0.59  LATICACIDVEN 1.6 1.6  --   --   --   --   --     Estimated Creatinine Clearance: 82.7 mL/min (by C-G formula based on SCr of 0.59 mg/dL).    Allergies  Allergen Reactions   Sulfa Antibiotics Other (See Comments)    Unknown/ childhood allergy.    Microbiology results: 12/8 Bcx: negative 12/7 Bcx: negative  Antimicrobials this admission: 12/11 Azithromycin  > 12/13 12/7 Unasyn  >> 12/7 vancomycin  >> 12/7 Zosyn  x 1  Thank you for allowing pharmacy to be a part of this patients care.  WENDI Amon Rocher, PharmD PGY-1 Pharmacy Resident [facility] 06/11/2024 9:58 AM

## 2024-06-12 LAB — BASIC METABOLIC PANEL WITH GFR
Anion gap: 5 (ref 5–15)
BUN: 10 mg/dL (ref 6–20)
CO2: 40 mmol/L — ABNORMAL HIGH (ref 22–32)
Calcium: 8.2 mg/dL — ABNORMAL LOW (ref 8.9–10.3)
Chloride: 93 mmol/L — ABNORMAL LOW (ref 98–111)
Creatinine, Ser: 0.61 mg/dL (ref 0.44–1.00)
GFR, Estimated: >60 mL/min (ref >60)
Glucose, Bld: 201 mg/dL — ABNORMAL HIGH (ref 70–99)
Potassium: 4.4 mmol/L (ref 3.5–5.1)
Sodium: 138 mmol/L (ref 135–145)

## 2024-06-12 LAB — CBC
HCT: 31.4 % — ABNORMAL LOW (ref 36.0–46.0)
Hemoglobin: 9.2 g/dL — ABNORMAL LOW (ref 12.0–15.0)
MCH: 26 pg (ref 26.0–34.0)
MCHC: 29.3 g/dL — ABNORMAL LOW (ref 30.0–36.0)
MCV: 88.7 fL (ref 80.0–100.0)
Platelets: 337 K/uL (ref 150–400)
RBC: 3.54 MIL/uL — ABNORMAL LOW (ref 3.87–5.11)
RDW: 13.9 % (ref 11.5–15.5)
WBC: 8.4 K/uL (ref 4.0–10.5)
nRBC: 0 % (ref 0.0–0.2)

## 2024-06-12 LAB — GLUCOSE, CAPILLARY
Glucose-Capillary: 174 mg/dL — ABNORMAL HIGH (ref 70–99)
Glucose-Capillary: 189 mg/dL — ABNORMAL HIGH (ref 70–99)
Glucose-Capillary: 143 mg/dL — ABNORMAL HIGH (ref 70–99)
Glucose-Capillary: 141 mg/dL — ABNORMAL HIGH (ref 70–99)

## 2024-06-12 MED ORDER — GLUCERNA SHAKE PO LIQD
237.0000 mL | Freq: Two times a day (BID) | ORAL | Status: DC
  Administered 2024-06-13 – 2024-06-23 (×12): 237 mL via ORAL

## 2024-06-12 MED ORDER — FUROSEMIDE 10 MG/ML IJ SOLN
20.0000 mg | Freq: Two times a day (BID) | INTRAMUSCULAR | Status: DC
  Administered 2024-06-12 – 2024-06-21 (×18): 20 mg via INTRAVENOUS
  Filled 2024-06-12 (×18): qty 2

## 2024-06-12 NOTE — Progress Notes (Signed)
 Physical Therapy Treatment Patient Details Name: Bianca Yang MRN: [id number] DOB: 09-Apr-1976 Today's Date: 06/12/2024   History of Present Illness Pt is a 48 y.o. F presenting to [facility] on 06/04/24 with L foot pain and swelling following L foot 5th ray amputation on 05/05/24. IMG done on 12/11 suggesting multifocal PNA. PMH is significant for DM, cocaine abuse.    PT Comments  Pt participated in transfer training this session, completing lateral scoot transfers between bed and recliner without physical assistance. Pt encouraged to perform lateral scoot transfer into WC at home following discharge to minimize WB through LLE; pt verbalized understanding. Pt continues to be impulsive, but demonstrates improved understanding of WB restrictions requiring less cueing than I previous sessions. PT will continue to treat pt while she is admitted. Patient will benefit from continued inpatient follow up therapy, <3 hours/day.    If plan is discharge home, recommend the following: A lot of help with walking and/or transfers;A lot of help with bathing/dressing/bathroom;Assistance with cooking/housework;Direct supervision/assist for medications management;Direct supervision/assist for financial management;Assist for transportation;Help with stairs or ramp for entrance   Can travel by private vehicle     No  Equipment Recommendations  Wheelchair (measurements PT);Wheelchair cushion (measurements PT)    Recommendations for Other Services       Precautions / Restrictions Precautions Precautions: Fall Recall of Precautions/Restrictions: Intact Restrictions Weight Bearing Restrictions Per Provider Order: Yes LLE Weight Bearing Per Provider Order: Non weight bearing Other Position/Activity Restrictions: Pt demonstrating good understanding of WB restrictions throughout session, requiring less prompting than in previous sessions     Mobility  Bed Mobility Overal bed mobility: Modified Independent              General bed mobility comments: Increased time to complete    Transfers Overall transfer level: Needs assistance Equipment used: None Transfers: Bed to chair/wheelchair/BSC            Lateral/Scoot Transfers: Supervision General transfer comment: Pt completed lateral scoot transfer from bed to recliner on the R, immitating WC transfer, pushing through RLE and BUE. VC given for sequencing; increased time to complete.    Ambulation/Gait                   Stairs             Wheelchair Mobility     Tilt Bed    Modified Rankin (Stroke Patients Only)       Balance Overall balance assessment: Needs assistance Sitting-balance support: No upper extremity supported, Feet supported Sitting balance-Leahy Scale: Good Sitting balance - Comments: seated EOB                                    Communication Communication Communication: Impaired Factors Affecting Communication: Difficulty expressing self  Cognition Arousal: Lethargic Behavior During Therapy: Impulsive, Restless   PT - Cognitive impairments: Awareness, Attention, Initiation, Sequencing, Problem solving, Safety/Judgement                         Following commands: Intact      Cueing Cueing Techniques: Verbal cues, Gestural cues, Visual cues  Exercises      General Comments General comments (skin integrity, edema, etc.): Upon entering room, pt's BSC bucket had fallen and spilled onto floor requiring increased time for cleaning. Pt did not have  in nose nor pulse ox on finger. Once pulse ox  was donned, SpO2 reading 82% with consistent wave form. Pt placed back on 5L of oxygen with SpO2 reading 88%. Pt encouraged to keep Maud in nose given current oxygen needs.      Pertinent Vitals/Pain Pain Assessment Pain Assessment: No/denies pain    Home Living                          Prior Function            PT Goals (current goals can now be  found in the care plan section) Acute Rehab PT Goals Patient Stated Goal: to get better Progress towards PT goals: Progressing toward goals    Frequency    Min 2X/week      PT Plan      Co-evaluation              AM-PAC PT 6 Clicks Mobility   Outcome Measure  Help needed turning from your back to your side while in a flat bed without using bedrails?: A Little Help needed moving from lying on your back to sitting on the side of a flat bed without using bedrails?: A Little Help needed moving to and from a bed to a chair (including a wheelchair)?: A Little Help needed standing up from a chair using your arms (e.g., wheelchair or bedside chair)?: A Little Help needed to walk in hospital room?: Total (to maintain WB restrictions) Help needed climbing 3-5 steps with a railing? : Total 6 Click Score: 14    End of Session Equipment Utilized During Treatment: Oxygen Activity Tolerance: Patient tolerated treatment well Patient left: in bed;with call bell/phone within reach Nurse Communication: Mobility status;Weight bearing status PT Visit Diagnosis: Unsteadiness on feet (R26.81);Muscle weakness (generalized) (M62.81);Pain Pain - Right/Left: Left Pain - part of body: Ankle and joints of foot     Time: 8566-8547 PT Time Calculation (min) (ACUTE ONLY): 19 min  Charges:    $Therapeutic Activity: 8-22 mins PT General Charges $$ ACUTE PT VISIT: 1 Visit                     Bianca Yang DPT Acute Rehab Services [phone] Prefer contact via chat    Bianca Yang 06/12/2024, 3:45 PM

## 2024-06-12 NOTE — Progress Notes (Signed)
 Nutrition Follow-up  DOCUMENTATION CODES:   Not applicable  INTERVENTION:  Continue Carb modified diet as ordered Reduce Glucerna Shake po to BID between meals, each supplement provides 220 kcal and 10 grams of protein Discontinue ProSource oral  Continue Juven BID to support wound healing MVI with minerals daily  NUTRITION DIAGNOSIS:   Increased nutrient needs related to wound healing as evidenced by estimated needs. - remains applicable  GOAL:   Patient will meet greater than or equal to 90% of their needs - goal progressing; likely met via meals and nutrition supplements  MONITOR:   PO intake, Supplement acceptance, Labs, Weight trends, Skin  REASON FOR ASSESSMENT:   Consult Wound healing  ASSESSMENT:   Pt admitted with foot pain d/t osteomyelitis following recent admission with fifth ray amputation on 05/05/24. PMH significant for DM2, diabetic foot ulcer, PAD.  Remains on abx therapy.  12/120 developed fever likely d/t PNA.  Assessed by CCM on 12/11 d/t work of breathing.  TOC working on discharge plan.   RD working remotely.  Chart reviewed and reflects adequate completion of meals. 100% x 7 recorded meals (12/02-1213) + 75% breakfast this morning.   Review of MAR reflects intermittent refusal of oral nutrition supplements. Will de-escalate quantity of nutrition supplements as oral intake of meals appears adequate, though would still continue to benefit from some supplementation to support wound healing.   No updated weight on file to review during admission. Will request.   Medications: lasix  20mg  BID, SSI 0-5 units at bedtime, SSI 0-9 units TID, novolog  5 units TID, lantus  15 units daily, mag-ox, MVI, IV abx x2  Labs:  Chloride 93 CBG's 174-230 x24 hours  Diet Order:   Diet Order             Diet Carb Modified Fluid consistency: Thin; Room service appropriate? Yes  Diet effective now                   EDUCATION NEEDS:   Not appropriate for  education at this time  Skin:  Skin Assessment: Reviewed RN Assessment (dehisence of lateral left foot)  Last BM:  12/14  Height:   Ht Readings from Last 1 Encounters:  06/04/24 5' 7 (1.702 m)    Weight:   Wt Readings from Last 1 Encounters:  06/04/24 67 kg   BMI:  Body mass index is 23.13 kg/m.  Estimated Nutritional Needs:   Kcal:  1700-1900  Protein:  95-110g  Fluid:  >/=1.7L  Royce Maris, RDN, LDN Clinical Nutrition See AMiON for contact information.

## 2024-06-12 NOTE — Plan of Care (Signed)
°  Problem: Coping: Goal: Ability to adjust to condition or change in health will improve Outcome: Progressing   Problem: Education: Goal: Knowledge of General Education information will improve Description: Including pain rating scale, medication(s)/side effects and non-pharmacologic comfort measures Outcome: Progressing   Problem: Activity: Goal: Risk for activity intolerance will decrease Outcome: Progressing   Problem: Coping: Goal: Level of anxiety will decrease Outcome: Progressing

## 2024-06-12 NOTE — Progress Notes (Signed)
 PROGRESS NOTE Bianca Yang  FMW:[id number] DOB: Nov 01, 1975 DOA: 06/04/2024 PCP: Patient, [name] Per  Brief Narrative/Hospital Course: Bianca Yang is a 48 y.o. female with past medical history significant of DM2, diabetic foot ulcer, OSA, PAD presented to hospital with left foot pain and swelling.  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  In the ED patient was afebrile.  Labs were notable for mild leukocytosis but lactate was within normal range.  BMP showed mild hyponatremia with sodium of 131 with a glucose elevation at 483.  ESR was elevated at 41 with CRP of 2.9.  X-ray of the left foot showed large lateral midfoot ulceration with lytic destruction of the cuboid and proximal base of the fourth metatarsal suspicious osteomyelitis.  CT scan of the foot was then performed which showed acute osteomyelitis involving the fourth metatarsal and cuboid.  Patient was then considered for admission to the hospital for further evaluation and treatment Patient continued on IV antibiotics.  Also noted to be hypercapnic and hypoxic needing HFNC 12/11: Seen by PCCM  Subjective: Seen and examined this morning Patient reports she is feeling fine she is mostly sleeping on the bed Overnight she has been afebrile vital stable but still on 8 L HFNC-again asked nursing staff to wean down the oxygen Overnight labs remains stable blood sugar 170s CBC with mild anemia normal WBC count Oxygen weaned down to 6 L on my request however patient starting to become hypoxic up to 87%  Assessment and plan:  Diabetic foot infection with acute osteomyelitis of the left foot metatarsal, wound dehiscence: Inflammation marker CRP ESR up. CT scan and x-ray of the foot suggestive of acute osteomyelitis. 12/8 ABI normal . Dr. Harden consulted and advised NWB LLE- stated there is bone marrow edema with no  plans for surgical intervention at this time. Patient remains on broad-spectrum antibiotics and seen by infectious disease  Blood culture remains sterile Patient has had fever of 102 F 12/10-likely from pneumonia and has been afebrile since and WBC improved Had a fall 12/10 while trying to go to the bathroom.Encouraged and educated on NWB. Per ID cont Vanco/Unasyn  till discharge with plan for Augmentin  and Zyvox  x 6 weeks follow-up with ID clinic.   Dr. Harden continues to follow-up on the wound-and advising nonsurgical intervention at this time.  Reported obstructive sleep apnea/OSA Acute on chronic hypoxia and hypercapnea-noncompliant to CPAP Acute mild systolic dysfunction: Patient with history of sleep apnea (?) not compliant with CPAP-adamantly refusing here. VBG in the ED-pH 7.38 pCO2 68 pO2 52 showed evidence of hypercapnia VBG on recheck showed similar hypercapnia, compensated, still needing nasal cannula and trying to wean down. PCCM was consulted-suspect hypoxia driven by untreated OSA as well as hypoventilation somnolence, continue on CAP coverage for multifocal pneumonia continue bronchodilators supplemental oxygen and CPAP bedtime if she allows  TTE- obtained: EF 45-50% -previous echo to compare has mildly decreased LV function no RWMA RV SF mildly reduced RV size mildly enlarged mitral valve is abnormal-mild MR, no evidence of stenosis.  No evidence of fluid overload BNP 208. She will need outpatient further follow-up with cardiology.  She denies any chest pain Continue to treat underlying pneumonia, continue cefepime  oxygen Increase Lasix  to 20 mg bid, cont to wean oxygen to keep saturation 91-93%   MultifocaL Pneumonia Tobacco abuse-quit recently: CXR  12/11-Diffuse interstitial lung opacity  with clustered micro nodularity  in RML, rt  base LLL-suggest multifocal pneumonia  Continue vancomycin  and Unasyn  as above and azithromycin  added 12/11 for CAP seen by PCCM. Urine antigens  ordered unable to be collected. She smoked and quit a month ago. Less than pack a day x 28 yrs Needing supplemental oxygen HFNC and working on weaning down   DM type 2 with uncontrolled hyperglycemia: Latest hemoglobin A1c of 11.6 a month.  Continue current lantus15u a,ssi and add premela 5 u tid if eating > 50% of meal  Patient has high chances of noncompliance with diet and medication.  Changed insulin  to 0-9 SSI-due to slightly low blood sugar 12/14. Home glimepiride  and metformin  on hold.  Diabetes coordinator consulted and patient has been educated on insulin  use-as she will need to go home on insulin  Recent Labs  Lab 06/11/24 1120 06/11/24 1540 06/11/24 2124 06/12/24 0550 06/12/24 1106  GLUCAP 105* 230* 174* 174* 189*    Hyponatremia: likely from hyperglycemia, monitor  Social issues: Patient might need more support at home. toc consulted.  Hypomagnesemia: Improved  Hypoalbuminemia: Augment diet RD following  Normocytic anemia: Some drop in hemoglobin holding stable -although on admission in 12. Checked anemia panel. Recent Labs    06/07/24 0218 06/08/24 0218 06/09/24 0215 06/10/24 0213 06/10/24 0816 06/10/24 0817 06/10/24 0914 06/12/24 0149  HGB 9.7* 9.3* 8.5* 8.8*  --   --   --  9.2*  MCV 86.8 89.1 87.1 87.9  --   --   --  88.7  VITAMINB12  --   --   --   --   --   --  332  --   FOLATE  --   --   --   --   --  13.8  --   --   FERRITIN  --   --   --   --   --   --  133  --   TIBC  --   --   --   --   --   --  230*  --   IRON  --   --   --   --   --   --  21*  --   RETICCTPCT  --   --   --   --  1.5  --   --   --    Substance abuse History of cocaine abuse Hx of crack cocaine use, + a month ago-  UDS positive for opiates Patient appears much older than her stated age.  She is residing at her father's house who is taking care of.  Mobility: PT Orders: Active PT Follow up Rec: Skilled Nursing-Short Term Rehab (<3 Hours/Day)06/09/2024 1500   DVT  prophylaxis: enoxaparin  (LOVENOX ) injection 40 mg Start: 06/08/24 1100 SCDs Start: 06/08/24 1046 SCDs Start: 06/04/24 1922  Code Status:   Code Status: Full Code Family Communication: plan of care discussed with patient at bedside. Father was updated at the bedside previously Patient status is: Remains hospitalized because of severity of illness Level of care: Progressive   Dispo: The patient is from: HOME            Anticipated disposition:Per TOC no SNF willing to accept her.  Patient and her father planning to take her home with home health  Objective: Vitals last 24 hrs: Vitals:   06/11/24 2334 06/12/24 0345 06/12/24 0735 06/12/24 1105  BP: 114/71 (!) 104/56 130/78 130/77  Pulse: 74 70 70 87  Resp: 18  20 18 17   Temp: 97.8 F (36.6 C) 97.7 F (36.5 C) 97.7 F (36.5 C)   TempSrc: Oral Oral Oral   SpO2: 93% 92% 92% 94%  Weight:      Height:        Physical Examination: General exam: AAOX3, not in distress, appears older than her stated age HEENT:Oral mucosa moist, Ear/Nose WNL grossly Respiratory system: Clear to auscultation bilaterally, no use of accessory muscle Cardiovascular system: S1 & S2 +, No JVD. Gastrointestinal system: Abd soft,NT,ND, BS+ Nervous System: Alert, awake, moving all extremities,and following commands. Extremities: extremities warm, leg edema neg , left foot warm dressing intact see pic Skin: Warm, no rashes MSK: Normal muscle bulk,tone, power   Medications reviewed:  Scheduled Meds:  (feeding supplement) PROSource Plus  30 mL Oral BID WC   acetaminophen   975 mg Oral TID   enoxaparin  (LOVENOX ) injection  40 mg Subcutaneous Daily   feeding supplement (GLUCERNA SHAKE)  237 mL Oral TID BM   furosemide   20 mg Intravenous Daily   insulin  aspart  0-5 Units Subcutaneous QHS   insulin  aspart  0-9 Units Subcutaneous TID WC   insulin  aspart  5 Units Subcutaneous TID WC   insulin  glargine  15 Units Subcutaneous Daily   magnesium  oxide  400 mg Oral BID    multivitamin with minerals  1 tablet Oral Daily   nitroGLYCERIN   0.2 mg Transdermal Daily   nutrition supplement (JUVEN)  1 packet Oral BID WC   pantoprazole   40 mg Oral Daily   Continuous Infusions:  ampicillin -sulbactam (UNASYN ) IV 3 g (06/12/24 0953)   vancomycin  1,750 mg (06/11/24 1940)   Diet: Diet Order             Diet Carb Modified Fluid consistency: Thin; Room service appropriate? Yes  Diet effective now                    Data Reviewed: I have personally reviewed following labs and imaging studies ( see epic result tab) CBC: Recent Labs  Lab 06/07/24 0218 06/08/24 0218 06/09/24 0215 06/10/24 0213 06/12/24 0149  WBC 14.2* 13.4* 11.7* 10.9* 8.4  NEUTROABS 10.5* 10.2*  --   --   --   HGB 9.7* 9.3* 8.5* 8.8* 9.2*  HCT 32.8* 30.4* 28.4* 29.0* 31.4*  MCV 86.8 89.1 87.1 87.9 88.7  PLT 193 193 215 253 337   CMP: Recent Labs  Lab 06/08/24 0218 06/09/24 0215 06/10/24 0213 06/11/24 1009 06/12/24 0149  NA 131* 135 133* 139 138  K 4.5 3.8 4.5 3.6 4.4  CL 88* 90* 91* 92* 93*  CO2 38* 39* 37* 40* 40*  GLUCOSE 339* 134* 264* 67* 201*  BUN 22* 12 18 8 10   CREATININE 0.44 0.49 0.59 0.54 0.61  CALCIUM  8.2* 8.2* 8.4* 8.6* 8.2*  MG 1.7  --   --   --   --    GFR: Estimated Creatinine Clearance: 82.7 mL/min (by C-G formula based on SCr of 0.61 mg/dL). No results for input(s): AST, ALT, ALKPHOS, BILITOT, PROT, ALBUMIN in the last 168 hours.  No results for input(s): LIPASE, AMYLASE in the last 168 hours. No results for input(s): AMMONIA in the last 168 hours. Coagulation Profile: No results for input(s): INR, PROTIME in the last 168 hours. Unresulted Labs (From admission, onward)     Start     Ordered   06/15/24 0500  Creatinine, serum  (enoxaparin  (LOVENOX )    CrCl >/= 30 ml/min)  Weekly,  R     Comments: while on enoxaparin  therapy   Question:  Specimen collection method  Answer:  Lab=Lab collect   06/08/24 1045   06/08/24 1309   Expectorated Sputum Assessment w Gram Stain, Rflx to Resp Cult  Once,   R        06/08/24 1308   06/08/24 1308  Legionella Pneumophila Serogp 1 Ur Ag  Once,   R        06/08/24 1308   06/08/24 1308  Strep pneumoniae urinary antigen  Once,   R        06/08/24 1308           Antimicrobials/Microbiology: Anti-infectives (From admission, onward)    Start     Dose/Rate Route Frequency Ordered Stop   06/08/24 1400  azithromycin  (ZITHROMAX ) 500 mg in sodium chloride  0.9 % 250 mL IVPB        500 mg 250 mL/hr over 60 Minutes Intravenous Every 24 hours 06/08/24 1309 06/10/24 1328   06/05/24 1700  vancomycin  (VANCOCIN ) 1,750 mg in sodium chloride  0.9 % 500 mL IVPB        1,750 mg 258.8 mL/hr over 120 Minutes Intravenous Every 24 hours 06/04/24 1945     06/04/24 2300  Ampicillin -Sulbactam (UNASYN ) 3 g in sodium chloride  0.9 % 100 mL IVPB        3 g 200 mL/hr over 30 Minutes Intravenous Every 6 hours 06/04/24 1945     06/04/24 1630  Vancomycin  (VANCOCIN ) 1,250 mg in sodium chloride  0.9 % 250 mL IVPB        1,250 mg 166.7 mL/hr over 90 Minutes Intravenous  Once 06/04/24 1622 06/04/24 1924   06/04/24 1630  piperacillin -tazobactam (ZOSYN ) IVPB 3.375 g        3.375 g 100 mL/hr over 30 Minutes Intravenous  Once 06/04/24 1622 06/04/24 1720   06/04/24 1615  vancomycin  (VANCOCIN ) 2,000 mg in sodium chloride  0.9 % 500 mL IVPB  Status:  Discontinued        2,000 mg 260 mL/hr over 120 Minutes Intravenous  Once 06/04/24 1611 06/04/24 1622   06/04/24 1615  piperacillin -tazobactam (ZOSYN ) IVPB 4.5 g  Status:  Discontinued        4.5 g 200 mL/hr over 30 Minutes Intravenous  Once 06/04/24 1611 06/04/24 1622         Component Value Date/Time   SDES BLOOD RIGHT ANTECUBITAL 06/05/2024 0110   SPECREQUEST  06/05/2024 0110    BOTTLES DRAWN AEROBIC AND ANAEROBIC Blood Culture adequate volume   CULT  06/05/2024 0110    NO GROWTH 5 DAYS Performed at [facility], [address], [place], [place]  [address]    REPTSTATUS 06/10/2024 FINAL 06/05/2024 0110    Procedures:    Mennie LAMY, MD [facility] 06/12/2024, 12:13 PM

## 2024-06-13 ENCOUNTER — Inpatient Hospital Stay (HOSPITAL_COMMUNITY)

## 2024-06-13 LAB — CBC
HCT: 32.9 % — ABNORMAL LOW (ref 36.0–46.0)
Hemoglobin: 9.8 g/dL — ABNORMAL LOW (ref 12.0–15.0)
MCH: 25.9 pg — ABNORMAL LOW (ref 26.0–34.0)
MCHC: 29.8 g/dL — ABNORMAL LOW (ref 30.0–36.0)
MCV: 87 fL (ref 80.0–100.0)
Platelets: 407 K/uL — ABNORMAL HIGH (ref 150–400)
RBC: 3.78 MIL/uL — ABNORMAL LOW (ref 3.87–5.11)
RDW: 14.4 % (ref 11.5–15.5)
WBC: 11.2 K/uL — ABNORMAL HIGH (ref 4.0–10.5)
nRBC: 0.2 % (ref 0.0–0.2)

## 2024-06-13 LAB — BASIC METABOLIC PANEL WITH GFR
Anion gap: 10 (ref 5–15)
BUN: 19 mg/dL (ref 6–20)
CO2: 32 mmol/L (ref 22–32)
Calcium: 8.8 mg/dL — ABNORMAL LOW (ref 8.9–10.3)
Chloride: 96 mmol/L — ABNORMAL LOW (ref 98–111)
Creatinine, Ser: 0.91 mg/dL (ref 0.44–1.00)
GFR, Estimated: >60 mL/min (ref >60)
Glucose, Bld: 239 mg/dL — ABNORMAL HIGH (ref 70–99)
Potassium: 4.6 mmol/L (ref 3.5–5.1)
Sodium: 138 mmol/L (ref 135–145)

## 2024-06-13 LAB — GLUCOSE, CAPILLARY
Glucose-Capillary: 329 mg/dL — ABNORMAL HIGH (ref 70–99)
Glucose-Capillary: 213 mg/dL — ABNORMAL HIGH (ref 70–99)
Glucose-Capillary: 188 mg/dL — ABNORMAL HIGH (ref 70–99)
Glucose-Capillary: 222 mg/dL — ABNORMAL HIGH (ref 70–99)

## 2024-06-13 MED ORDER — PNEUMOCOCCAL 20-VAL CONJ VACC 0.5 ML IM SUSY
0.5000 mL | PREFILLED_SYRINGE | INTRAMUSCULAR | Status: AC
  Administered 2024-06-14: 10:00:00 0.5 mL via INTRAMUSCULAR
  Filled 2024-06-13: qty 0.5

## 2024-06-13 MED ORDER — WHITE PETROLATUM EX OINT
1.0000 | TOPICAL_OINTMENT | CUTANEOUS | Status: DC | PRN
  Administered 2024-06-13: 05:00:00 1 via TOPICAL
  Filled 2024-06-13: qty 28.35

## 2024-06-13 MED ORDER — VITAMIN B-12 1000 MCG PO TABS
500.0000 ug | ORAL_TABLET | Freq: Every day | ORAL | Status: DC
  Administered 2024-06-13 – 2024-06-23 (×11): 500 ug via ORAL
  Filled 2024-06-13 (×11): qty 1

## 2024-06-13 NOTE — Progress Notes (Signed)
 PROGRESS NOTE Bianca Yang  FMW:[id number] DOB: 1976/01/07 DOA: 06/04/2024 PCP: Patient, [name] Per  Brief Narrative/Hospital Course: Bianca Yang is a 48 y.o. female with past medical history significant of DM2, diabetic foot ulcer, OSA, PAD presented to hospital with left foot pain and swelling.  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  In the ED patient was afebrile.  Labs were notable for mild leukocytosis but lactate was within normal range.  BMP showed mild hyponatremia with sodium of 131 with a glucose elevation at 483.  ESR was elevated at 41 with CRP of 2.9.  X-ray of the left foot showed large lateral midfoot ulceration with lytic destruction of the cuboid and proximal base of the fourth metatarsal suspicious osteomyelitis.  CT scan of the foot was then performed which showed acute osteomyelitis involving the fourth metatarsal and cuboid.  Patient was then considered for admission to the hospital for further evaluation and treatment Patient continued on IV antibiotics.  Also noted to be hypercapnic and hypoxic needing HFNC 12/11: Seen by PCCM  Subjective: Seen and examined Mostly laying in the bed denies any new complaints Overnight afebrile BP stable still needing high flow 8 L wean down to 7 L yesterday able to come down as low as 4 L Discussed with nursing staff to wean down oxygen Chest x-ray obtained.  Continues to refuse CPAP nightly Labs overall stable chronic anemia stable renal function CBG poorly controlled  Assessment and plan:  Diabetic foot infection with acute osteomyelitis of the left foot metatarsal, wound dehiscence: Inflammation marker CRP ESR up. CT scan and x-ray of the foot suggestive of acute osteomyelitis. 12/8 ABI normal . Dr. Fortino NWB LLE- stated there is bone marrow edema with no plans for surgical intervention at this  time. Blood culture remains sterile Patient has had fever of 102 F 12/10-likely from pneumonia and has been afebrile since and WBC improved Had a fall 12/10 while trying to go to the bathroom.Encouraged and educated on NWB. Seen by ID-- cont Vanco/Unasyn  till discharge with plan for Augmentin  and Zyvox  x 6 weeks follow-up with ID clinic.   Dr. Harden continues to follow-up on the wound-and advising nonsurgical intervention at this time.  Reported obstructive sleep apnea/OSA-noncompliant Acute on chronic hypoxia and hypercapnea Acute mild systolic dysfunction: Patient with history of sleep apnea (?) not compliant with CPAP-adamantly refusing here. VBG in the ED-pH 7.38 pCO2 68 pO2 52 showed evidence of hypercapnia VBG on recheck showed similar hypercapnia, compensated, still needing nasal cannula and trying to wean down. PCCM was consulted-suspect hypoxia driven by untreated OSA as well as hypoventilation somnolence She has been alert awake oriented, no somnolence. Repeat chest x-ray obtained this morning still needing high flow [place], remains on IV Lasix  to optimize volume status Continue to treat pneumonia TTE- obtained: EF 45-50% -previous echo to compare has mildly decreased LV function no RWMA RV SF mildly reduced RV size mildly enlarged mitral valve is abnormal-mild MR, no evidence of stenosis.  No evidence of fluid overload BNP 208. She will need outpatient further follow-up with cardiology.  She denies any chest pain Cont Lasix  to 20 mg bid, cont to wean oxygen to keep saturation 91-93%  Once she is able to come down to 2 L nasal cannula hopefully we can plan on discharge  MultifocaL Pneumonia Tobacco abuse-quit recently: CXR  12/11-Diffuse interstitial  lung opacity with clustered micro nodularity  in RML, rt  base LLL-suggest multifocal pneumonia  Continue vancomycin  and Unasyn  as above and azithromycin  added 12/11 for CAP seen by PCCM. Urine antigens ordered unable to be collected. She  smoked and quit a month ago. Less than pack a day x 28 yrs Chest x-ray appear stable   T2DM with uncontrolled hyperglycemia: A1c of 11.6 a month ago.  Started on lantus15u a,ssi and  premela 5 u tid if eating > 50% of meal - Patient has high chances of noncompliance with diet and medication. Home glimepiride  and metformin  on hold. Diabetes coordinator consulted and patient has been educated on insulin  use-as she will need to go home on insulin  Recent Labs  Lab 06/12/24 0550 06/12/24 1106 06/12/24 1537 06/12/24 2125 06/13/24 0624  GLUCAP 174* 189* 143* 141* 329*    Hyponatremia: likely from hyperglycemia, monitor  Social issues: Patient might need more support at home. toc consulted.  Hypomagnesemia: Improved  Hypoalbuminemia: Augment diet RD following  Normocytic anemia: Some drop in hemoglobin holding stable -although on admission in 12. Checked anemia panel iron slightly low at 21 with normal ferritin B12 332 folate normal will put on B12 supplement for borderline B12  Substance abuse History of cocaine abuse Hx of crack cocaine use, + a month ago-  UDS positive for opiates Patient appears much older than her stated age.  She is residing at her father's house who is taking care of.  Mobility: PT Orders: Active PT Follow up Rec: Skilled Nursing-Short Term Rehab (<3 Hours/Day)06/12/2024 1500   DVT prophylaxis: enoxaparin  (LOVENOX ) injection 40 mg Start: 06/08/24 1100 SCDs Start: 06/08/24 1046 SCDs Start: 06/04/24 1922  Code Status:   Code Status: Full Code Family Communication: plan of care discussed with patient at bedside.  Father was updated at the bedside multiple times. Father was updated at the bedside previously Patient status is: Remains hospitalized because of severity of illness Level of care: Progressive   Dispo: The patient is from: HOME            Anticipated disposition:Per TOC no SNF willing to accept her.Patient and her father planning to take her home  with home health upon discharge  Objective: Vitals last 24 hrs: Vitals:   06/12/24 2352 06/13/24 0406 06/13/24 0725 06/13/24 1012  BP: 131/83 130/79 126/80   Pulse: 80 89 75   Resp: 18 18 18    Temp: 97.7 F (36.5 C) 97.7 F (36.5 C) 97.7 F (36.5 C)   TempSrc: Oral Oral Oral   SpO2: 90% 93% 93% 96%  Weight:      Height:        Physical Examination: General exam: AAOX3, mostly sleeping in the bed not in distress  HEENT:Oral mucosa moist, Ear/Nose WNL grossly Respiratory system: Clear to auscultation bilaterally, no use of accessory muscle Cardiovascular system: S1 & S2 +, No JVD. Gastrointestinal system: Abd soft,NT,ND, BS+ Nervous System: Alert, awake, moving all extremities,and following commands. Extremities: extremities warm, leg edema neg left foot toes visible with dressing in place, warm Skin: Warm, no rashes MSK: Normal muscle bulk,tone, power   Medications reviewed:  Scheduled Meds:  acetaminophen   975 mg Oral TID   enoxaparin  (LOVENOX ) injection  40 mg Subcutaneous Daily   feeding supplement (GLUCERNA SHAKE)  237 mL Oral BID BM   furosemide   20 mg Intravenous BID   insulin  aspart  0-5 Units Subcutaneous QHS   insulin  aspart  0-9 Units Subcutaneous TID WC   insulin  aspart  5 Units Subcutaneous TID WC   insulin  glargine  15 Units Subcutaneous Daily   magnesium  oxide  400 mg Oral BID   multivitamin with minerals  1 tablet Oral Daily   nitroGLYCERIN   0.2 mg Transdermal Daily   nutrition supplement (JUVEN)  1 packet Oral BID WC   pantoprazole   40 mg Oral Daily   [START ON 06/14/2024] pneumococcal 20-valent conjugate vaccine  0.5 mL Intramuscular Tomorrow-1000   Continuous Infusions:  ampicillin -sulbactam (UNASYN ) IV 3 g (06/13/24 0811)   vancomycin  1,750 mg (06/12/24 1752)   Diet: Diet Order             Diet Carb Modified Fluid consistency: Thin; Room service appropriate? Yes  Diet effective now                    Data Reviewed: I have personally  reviewed following labs and imaging studies ( see epic result tab) CBC: Recent Labs  Lab 06/07/24 0218 06/08/24 0218 06/09/24 0215 06/10/24 0213 06/12/24 0149 06/13/24 0156  WBC 14.2* 13.4* 11.7* 10.9* 8.4 11.2*  NEUTROABS 10.5* 10.2*  --   --   --   --   HGB 9.7* 9.3* 8.5* 8.8* 9.2* 9.8*  HCT 32.8* 30.4* 28.4* 29.0* 31.4* 32.9*  MCV 86.8 89.1 87.1 87.9 88.7 87.0  PLT 193 193 215 253 337 407*   CMP: Recent Labs  Lab 06/08/24 0218 06/09/24 0215 06/10/24 0213 06/11/24 1009 06/12/24 0149 06/13/24 0156  NA 131* 135 133* 139 138 138  K 4.5 3.8 4.5 3.6 4.4 4.6  CL 88* 90* 91* 92* 93* 96*  CO2 38* 39* 37* 40* 40* 32  GLUCOSE 339* 134* 264* 67* 201* 239*  BUN 22* 12 18 8 10 19   CREATININE 0.44 0.49 0.59 0.54 0.61 0.91  CALCIUM  8.2* 8.2* 8.4* 8.6* 8.2* 8.8*  MG 1.7  --   --   --   --   --    GFR: Estimated Creatinine Clearance: 72.7 mL/min (by C-G formula based on SCr of 0.91 mg/dL). No results for input(s): AST, ALT, ALKPHOS, BILITOT, PROT, ALBUMIN in the last 168 hours.  No results for input(s): LIPASE, AMYLASE in the last 168 hours. No results for input(s): AMMONIA in the last 168 hours. Coagulation Profile: No results for input(s): INR, PROTIME in the last 168 hours. Unresulted Labs (From admission, onward)     Start     Ordered   06/15/24 0500  Creatinine, serum  (enoxaparin  (LOVENOX )    CrCl >/= 30 ml/min)  Weekly,   R     Comments: while on enoxaparin  therapy   Question:  Specimen collection method  Answer:  Lab=Lab collect   06/08/24 1045   06/13/24 0500  Basic metabolic panel with GFR  Daily,   R     Question:  Specimen collection method  Answer:  Lab=Lab collect   06/12/24 1214   06/13/24 0500  CBC  Daily,   R     Question:  Specimen collection method  Answer:  Lab=Lab collect   06/12/24 1214   06/08/24 1309  Expectorated Sputum Assessment w Gram Stain, Rflx to Resp Cult  Once,   R        06/08/24 1308   06/08/24 1308  Legionella  Pneumophila Serogp 1 Ur Ag  Once,   R        06/08/24 1308   06/08/24 1308  Strep pneumoniae urinary antigen  Once,   R  06/08/24 1308           Antimicrobials/Microbiology: Anti-infectives (From admission, onward)    Start     Dose/Rate Route Frequency Ordered Stop   06/08/24 1400  azithromycin  (ZITHROMAX ) 500 mg in sodium chloride  0.9 % 250 mL IVPB        500 mg 250 mL/hr over 60 Minutes Intravenous Every 24 hours 06/08/24 1309 06/10/24 1328   06/05/24 1700  vancomycin  (VANCOCIN ) 1,750 mg in sodium chloride  0.9 % 500 mL IVPB        1,750 mg 258.8 mL/hr over 120 Minutes Intravenous Every 24 hours 06/04/24 1945     06/04/24 2300  Ampicillin -Sulbactam (UNASYN ) 3 g in sodium chloride  0.9 % 100 mL IVPB        3 g 200 mL/hr over 30 Minutes Intravenous Every 6 hours 06/04/24 1945     06/04/24 1630  Vancomycin  (VANCOCIN ) 1,250 mg in sodium chloride  0.9 % 250 mL IVPB        1,250 mg 166.7 mL/hr over 90 Minutes Intravenous  Once 06/04/24 1622 06/04/24 1924   06/04/24 1630  piperacillin -tazobactam (ZOSYN ) IVPB 3.375 g        3.375 g 100 mL/hr over 30 Minutes Intravenous  Once 06/04/24 1622 06/04/24 1720   06/04/24 1615  vancomycin  (VANCOCIN ) 2,000 mg in sodium chloride  0.9 % 500 mL IVPB  Status:  Discontinued        2,000 mg 260 mL/hr over 120 Minutes Intravenous  Once 06/04/24 1611 06/04/24 1622   06/04/24 1615  piperacillin -tazobactam (ZOSYN ) IVPB 4.5 g  Status:  Discontinued        4.5 g 200 mL/hr over 30 Minutes Intravenous  Once 06/04/24 1611 06/04/24 1622         Component Value Date/Time   SDES BLOOD RIGHT ANTECUBITAL 06/05/2024 0110   SPECREQUEST  06/05/2024 0110    BOTTLES DRAWN AEROBIC AND ANAEROBIC Blood Culture adequate volume   CULT  06/05/2024 0110    NO GROWTH 5 DAYS Performed at [facility], [address] [place], [place] [address]    REPTSTATUS 06/10/2024 FINAL 06/05/2024 0110    Procedures:    Mennie LAMY, MD [facility] 06/13/2024,  10:43 AM

## 2024-06-13 NOTE — Progress Notes (Signed)
 Occupational Therapy Treatment Patient Details Name: SATOMI BUDA MRN: [id number] DOB: 23-Jun-1976 Today's Date: 06/13/2024   History of present illness Pt is a 48 y.o. 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 IMG done on 12/11 suggesting multifocal PNA. PMH is significant for DM, cocaine abuse.   OT comments  Pt resting in bed, father present during session. Pt doing better, overall CGA/supervision for mobility with RW up to 30 feet. Pt fatigues quickly, desats to 83% on RA. Pt O2 saturation improves to 94% or higher quickly with supplementation. Pt able to complete toileting, UB/LB dressing with set up/supervision, transfers to [facility] without physical assist. Spoke with Pt and father about DC plan, still recommending postacute rehab <3hrs/day, but HHOT is an option if Pt continues to improve, father states he can provide 24/7 support, has [address] her fathers home. Will need BSC and tub bench for return home.       If plan is discharge home, recommend the following:  A little help with walking and/or transfers;A little help with bathing/dressing/bathroom;Assistance with cooking/housework;Assist for transportation;Help with stairs or ramp for entrance   Equipment Recommendations  Other (comment) (tub bench, BSC if going home)    Recommendations for Other Services      Precautions / Restrictions Precautions Precautions: Fall Recall of Precautions/Restrictions: Intact Restrictions Weight Bearing Restrictions Per Provider Order: Yes LLE Weight Bearing Per Provider Order: Non weight bearing Other Position/Activity Restrictions: Pt demonstrating good understanding of WB restrictions throughout session, requiring less prompting than in previous sessions       Mobility Bed Mobility Overal bed mobility: Modified Independent                  Transfers Overall transfer level: Needs assistance Equipment used:  Rolling walker (2 wheels) Transfers: Sit to/from Stand, Bed to chair/wheelchair/BSC Sit to Stand: Contact guard assist Stand pivot transfers: Contact guard assist         General transfer comment: Pt CGA for transfers with RW, able to laterally scoot to [facility] very well.     Balance Overall balance assessment: Needs assistance Sitting-balance support: Feet supported Sitting balance-Leahy Scale: Good Sitting balance - Comments: seated EOB   Standing balance support: Bilateral upper extremity supported, During functional activity, Reliant on assistive device for balance Standing balance-Leahy Scale: Poor Standing balance comment: reliant on RW for support, NWB to LLE                           ADL either performed or assessed with clinical judgement   ADL Overall ADL's : Needs assistance/impaired Eating/Feeding: Independent   Grooming: Set up;Sitting           Upper Body Dressing : Set up;Sitting   Lower Body Dressing: Set up;Sitting/lateral leans;Cueing for safety   Toilet Transfer: Set up;Supervision/safety;Stand-pivot;BSC/3in1   Toileting- Clothing Manipulation and Hygiene: Set up       Functional mobility during ADLs: Contact guard assist;Rolling walker (2 wheels) General ADL Comments: Pt doing well overall, set up/CGA for mobility with RW, transfer to [facility].    Extremity/Trunk Assessment Upper Extremity Assessment Upper Extremity Assessment: Overall WFL for tasks assessed   Lower Extremity Assessment Lower Extremity Assessment: Defer to PT evaluation        Vision       Perception     Praxis     Communication Communication Communication: No apparent difficulties  Cognition Arousal: Alert Behavior During Therapy: Impulsive, Restless Cognition: No family/caregiver present to determine baseline             OT - Cognition Comments: Pt grossly A/O, impulsive with decreased safety awareness, restless.                 Following  commands: Intact        Cueing   Cueing Techniques: Verbal cues, Gestural cues, Visual cues  Exercises      Shoulder Instructions       General Comments Pt O2 saturation dropped to 83% after taking it off for bathroom and ambulation 30 feet with RW. quickly improved with O2 supplementation    Pertinent Vitals/ Pain       Pain Assessment Pain Assessment: Faces Faces Pain Scale: Hurts a little bit Pain Location: L foot Pain Descriptors / Indicators: Discomfort, Guarding, Grimacing Pain Intervention(s): Monitored during session  Home Living Family/patient expects to be discharged to:: Private residence       Home Access: Stairs to enter Secretary/administrator of Steps: 3 steps Entrance Stairs-Rails: None Home Layout: One level     Bathroom Shower/Tub: Tub/shower unit         Home Equipment: Agricultural Consultant (2 wheels)          Prior Functioning/Environment              Frequency  Min 2X/week        Progress Toward Goals  OT Goals(current goals can now be found in the care plan section)  Progress towards OT goals: Progressing toward goals     Plan      Co-evaluation                 AM-PAC OT 6 Clicks Daily Activity     Outcome Measure   Help from another person eating meals?: None Help from another person taking care of personal grooming?: A Little Help from another person toileting, which includes using toliet, bedpan, or urinal?: A Little Help from another person bathing (including washing, rinsing, drying)?: A Little Help from another person to put on and taking off regular upper body clothing?: A Little Help from another person to put on and taking off regular lower body clothing?: A Little 6 Click Score: 19    End of Session Equipment Utilized During Treatment: Gait belt;Rolling walker (2 wheels)  OT Visit Diagnosis: Unsteadiness on feet (R26.81);Other abnormalities of gait and mobility (R26.89);Pain;Other symptoms and signs  involving cognitive function Pain - Right/Left: Left Pain - part of body: Ankle and joints of foot;Leg   Activity Tolerance Patient tolerated treatment well   Patient Left in bed;with call bell/phone within reach;with family/visitor present   Nurse Communication Mobility status        Time: 1300-1320 OT Time Calculation (min): 20 min  Charges: OT General Charges $OT Visit: 1 Visit OT Treatments $Self Care/Home Management : 8-22 mins  [place], OTR/L   Elouise JONELLE Bott 06/13/2024, 1:28 PM

## 2024-06-13 NOTE — Progress Notes (Signed)
 Patient refused CPAP for the night

## 2024-06-14 ENCOUNTER — Inpatient Hospital Stay (HOSPITAL_COMMUNITY)

## 2024-06-14 DIAGNOSIS — M86172 Other acute osteomyelitis, left ankle and foot: Secondary | ICD-10-CM | POA: Diagnosis not present

## 2024-06-14 LAB — BASIC METABOLIC PANEL WITH GFR
Anion gap: 7 (ref 5–15)
BUN: 24 mg/dL — ABNORMAL HIGH (ref 6–20)
CO2: 34 mmol/L — ABNORMAL HIGH (ref 22–32)
Calcium: 9.1 mg/dL (ref 8.9–10.3)
Chloride: 94 mmol/L — ABNORMAL LOW (ref 98–111)
Creatinine, Ser: 0.57 mg/dL (ref 0.44–1.00)
GFR, Estimated: >60 mL/min (ref >60)
Glucose, Bld: 372 mg/dL — ABNORMAL HIGH (ref 70–99)
Potassium: 4.7 mmol/L (ref 3.5–5.1)
Sodium: 135 mmol/L (ref 135–145)

## 2024-06-14 LAB — CBC
HCT: 34.3 % — ABNORMAL LOW (ref 36.0–46.0)
Hemoglobin: 10.2 g/dL — ABNORMAL LOW (ref 12.0–15.0)
MCH: 25.8 pg — ABNORMAL LOW (ref 26.0–34.0)
MCHC: 29.7 g/dL — ABNORMAL LOW (ref 30.0–36.0)
MCV: 86.8 fL (ref 80.0–100.0)
Platelets: 447 K/uL — ABNORMAL HIGH (ref 150–400)
RBC: 3.95 MIL/uL (ref 3.87–5.11)
RDW: 14.6 % (ref 11.5–15.5)
WBC: 10.3 K/uL (ref 4.0–10.5)
nRBC: 0 % (ref 0.0–0.2)

## 2024-06-14 LAB — GLUCOSE, CAPILLARY
Glucose-Capillary: 371 mg/dL — ABNORMAL HIGH (ref 70–99)
Glucose-Capillary: 152 mg/dL — ABNORMAL HIGH (ref 70–99)
Glucose-Capillary: 154 mg/dL — ABNORMAL HIGH (ref 70–99)
Glucose-Capillary: 151 mg/dL — ABNORMAL HIGH (ref 70–99)

## 2024-06-14 LAB — D-DIMER, QUANTITATIVE: D-Dimer, Quant: 2.28 ug{FEU}/mL — ABNORMAL HIGH (ref 0.00–0.50)

## 2024-06-14 MED ORDER — INSULIN ASPART 100 UNIT/ML IJ SOLN
8.0000 [IU] | Freq: Three times a day (TID) | INTRAMUSCULAR | Status: DC
  Administered 2024-06-14 – 2024-06-23 (×24): 8 [IU] via SUBCUTANEOUS
  Filled 2024-06-14 (×23): qty 8

## 2024-06-14 MED ORDER — AMOXICILLIN-POT CLAVULANATE 875-125 MG PO TABS
1.0000 | ORAL_TABLET | Freq: Two times a day (BID) | ORAL | Status: DC
  Administered 2024-06-14 – 2024-06-23 (×19): 1 via ORAL
  Filled 2024-06-14 (×20): qty 1

## 2024-06-14 MED ORDER — IOHEXOL 350 MG/ML SOLN
75.0000 mL | Freq: Once | INTRAVENOUS | Status: AC | PRN
  Administered 2024-06-14: 16:00:00 75 mL via INTRAVENOUS

## 2024-06-14 MED ORDER — LINEZOLID 600 MG PO TABS
600.0000 mg | ORAL_TABLET | Freq: Two times a day (BID) | ORAL | Status: DC
  Administered 2024-06-14 – 2024-06-23 (×19): 600 mg via ORAL
  Filled 2024-06-14 (×21): qty 1

## 2024-06-14 MED ORDER — INSULIN GLARGINE 100 UNIT/ML ~~LOC~~ SOLN
20.0000 [IU] | Freq: Every day | SUBCUTANEOUS | Status: DC
  Administered 2024-06-14 – 2024-06-20 (×7): 20 [IU] via SUBCUTANEOUS
  Filled 2024-06-14 (×7): qty 0.2

## 2024-06-14 NOTE — Progress Notes (Signed)
 Physical Therapy Treatment Patient Details Name: Bianca Yang MRN: [id number] DOB: 08/28/1975 Today's Date: 06/14/2024   History of Present Illness Pt is a 48 y.o. 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 IMG done on 12/11 suggesting multifocal PNA. Stitches removed on 12/17. PMH is significant for DM, cocaine abuse.    PT Comments  Pt demonstrating good progress this session, upholding WB restrictions throughout without cueing from therapist. Pt continues to fatigue quickly, ambulating short distances with AD and no physical assistance, requiring cueing for deep breathing and activity pacing. PT will continue to treat pt while she is admitted. Continuing to recommend inpatient follow up therapy, <3 hours/day, but HHPT is an option if pt continues to improve and demonstrate understanding of WB restrictions. Per OT note, pt's father is able to provide 24/7 assistance.       If plan is discharge home, recommend the following: A lot of help with walking and/or transfers;A lot of help with bathing/dressing/bathroom;Assistance with cooking/housework;Direct supervision/assist for medications management;Direct supervision/assist for financial management;Assist for transportation;Help with stairs or ramp for entrance   Can travel by private vehicle     No  Equipment Recommendations  Wheelchair (measurements PT);Wheelchair cushion (measurements PT);BSC/3in1;Other (comment) (tub bench)    Recommendations for Other Services       Precautions / Restrictions Precautions Precautions: Fall Recall of Precautions/Restrictions: Intact Restrictions Weight Bearing Restrictions Per Provider Order: Yes LLE Weight Bearing Per Provider Order: Non weight bearing Other Position/Activity Restrictions: Pt demonstrating good understanding of WB restrictions this session, upholding them throughout session without cueing from therapist.      Mobility  Bed Mobility Overal bed mobility: Modified Independent             General bed mobility comments: Increased time to complete    Transfers Overall transfer level: Needs assistance Equipment used: Rolling walker (2 wheels) Transfers: Sit to/from Stand Sit to Stand: Contact guard assist           General transfer comment: Pt completed STS from EOB w/ RW and no physical assistance. Pt demonstrates good understanding of WB restrictions keeping LLE elevated while completing stand. Increased time to complete.    Ambulation/Gait Ambulation/Gait assistance: Contact guard assist Gait Distance (Feet): 16 Feet Assistive device: Rolling walker (2 wheels) Gait Pattern/deviations: Trunk flexed, Step-to pattern Gait velocity: decreased Gait velocity interpretation: <1.8 ft/sec, indicate of risk for recurrent falls   General Gait Details: Pt demonstrating hop to gait pattern with increased reliance on RW for stability.  Pt requires standing rest break due to oxygen destating to 82% on 6L but with prolonged rest and cues for deep breathing, oxygen elevates to 92% for remainder of session.   Stairs             Wheelchair Mobility     Tilt Bed    Modified Rankin (Stroke Patients Only)       Balance Overall balance assessment: Needs assistance Sitting-balance support: No upper extremity supported, Feet supported Sitting balance-Leahy Scale: Good Sitting balance - Comments: seated EOB   Standing balance support: Bilateral upper extremity supported, During functional activity, Reliant on assistive device for balance Standing balance-Leahy Scale: Poor Standing balance comment: reliant on RW for support, NWB to LLE                            Communication Communication Communication: No apparent difficulties  Cognition Arousal: Alert Behavior During Therapy: Restless   PT - Cognitive impairments: Awareness, Attention, Initiation, Sequencing,  Problem solving                         Following commands: Intact      Cueing Cueing Techniques: Verbal cues, Visual cues  Exercises      General Comments General comments (skin integrity, edema, etc.): SpO2 dropped to 82% on 6L HFNC while ambulating requiring standing rest break for recovery, improving to 92%.      Pertinent Vitals/Pain Pain Assessment Pain Assessment: Faces Faces Pain Scale: No hurt Pain Intervention(s): Monitored during session    Home Living                          Prior Function            PT Goals (current goals can now be found in the care plan section) Acute Rehab PT Goals Patient Stated Goal: to get better Progress towards PT goals: Progressing toward goals    Frequency    Min 2X/week      PT Plan      Co-evaluation              AM-PAC PT 6 Clicks Mobility   Outcome Measure  Help needed turning from your back to your side while in a flat bed without using bedrails?: A Little Help needed moving from lying on your back to sitting on the side of a flat bed without using bedrails?: A Little Help needed moving to and from a bed to a chair (including a wheelchair)?: A Little Help needed standing up from a chair using your arms (e.g., wheelchair or bedside chair)?: A Little Help needed to walk in hospital room?: A Little Help needed climbing 3-5 steps with a railing? : Total 6 Click Score: 16    End of Session Equipment Utilized During Treatment: Oxygen Activity Tolerance: Patient tolerated treatment well Patient left: in bed;with call bell/phone within reach;with bed alarm set Nurse Communication: Mobility status;Weight bearing status PT Visit Diagnosis: Unsteadiness on feet (R26.81);Muscle weakness (generalized) (M62.81);Pain Pain - Right/Left: Left Pain - part of body: Ankle and joints of foot     Time: 8663-8646 PT Time Calculation (min) (ACUTE ONLY): 17 min  Charges:    $Therapeutic Activity:  8-22 mins PT General Charges $$ ACUTE PT VISIT: 1 Visit                     Leontine Hilt DPT Acute Rehab Services [phone] Prefer contact via chat    Leontine NOVAK Anelle Parlow 06/14/2024, 2:21 PM

## 2024-06-14 NOTE — Progress Notes (Signed)
 PROGRESS NOTE Bianca Yang  FMW:[id number] DOB: [birth year] DOA: 06/04/2024 PCP: Patient, [name] Per  Brief Narrative/Hospital Course: Bianca Yang is a 48 y.o. female with past medical history significant of DM2, diabetic foot ulcer, OSA, PAD presented to hospital with left foot pain and swelling.  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  In the ED patient was afebrile.  Labs were notable for mild leukocytosis but lactate was within normal range.  BMP showed mild hyponatremia with sodium of 131 with a glucose elevation at 483.  ESR was elevated at 41 with CRP of 2.9.  X-ray of the left foot showed large lateral midfoot ulceration with lytic destruction of the cuboid and proximal base of the fourth metatarsal suspicious osteomyelitis.  CT scan of the foot was then performed which showed acute osteomyelitis involving the fourth metatarsal and cuboid.  Patient was then considered for admission to the hospital for further evaluation and treatment Patient continued on IV antibiotics.  Also noted to be hypercapnic and hypoxic needing HFNC 12/11: Seen by PCCM  Subjective: Seen and examined this morning Mostly laying on the bed, on my request trying to get up to the bedside chair Overnight still on HFNC 7 L weaned down to 6 L and denies shortness of breath chest pain fever chills Vitally stable labs reviewed hyperglycemia stable renal function and WBC normal Patient having some pleuritic chest pain this morning  Assessment and plan:  Diabetic foot infection with acute osteomyelitis of the left foot metatarsal, wound dehiscence: Inflammation marker CRP ESR up. CT scan and x-ray of the foot suggestive of acute osteomyelitis. 12/8 ABI normal . Dr. Fortino NWB LLE- stated there is bone marrow edema with no plans for surgical intervention at this time. Blood culture  remains sterile Patient has had fever of 102 F 12/10-likely from pneumonia and has been afebrile since and WBC improved Had a fall 12/10 while trying to go to the bathroom.Encouraged and educated on NWB. Seen by ID-- cont Vanco/Unasyn  till discharge with plan for Augmentin  and Zyvox  x 6 weeks follow-up with ID clinic.   Dr. Harden continues to follow-up on the wound-and advising nonsurgical intervention at this time. Informing Dr Harden if stitch can be removed  Reported obstructive sleep apnea/OSA-noncompliant Acute on chronic hypoxia and hypercapnea Acute mild systolic dysfunction: Patient with history of sleep apnea (?) not compliant with CPAP-adamantly refusing here. VBG in the ED-pH 7.38 pCO2 68 pO2 52-with similar hypercapnia on recheck and seen by PCCM patient refused CPAP, has been needing HFNC in the setting of multifocal pneumonia>CXR recheck 12/16-mild patchy interstitial prominence with a small effusion and associated atelectasis versus infection  Sill needing hfnc> continue wean down oxygen, also getting diuresis given mild systolic dysfunction TTE- obtained: EF 45-50% -no previous echo to compare has mildly decreased LV function no RWMA RV SF mildly reduced RV size mildly enlarged mitral valve is abnormal-mild MR, no evidence of stenosis.  No evidence of fluid overload BNP 208. She will need outpatient further follow-up with cardiology.  She denies any chest pain Cont Lasix  to 20 mg bid, cont to wean oxygen to keep saturation 91-93%  Once she is able to come down to 2 L nasal cannula hopefully we can plan on discharge  MultifocaL Pneumonia Tobacco abuse-quit recently: CXR  12/11-Diffuse interstitial lung opacity with clustered micro nodularity  in RML, rt  base LLL-suggest multifocal pneumonia  Continue vancomycin  and Unasyn  as above and azithromycin  added 12/11 for CAP seen by PCCM. Urine antigens ordered unable to be collected. She smoked and quit a month ago. Less than pack a day x  28 yrs Chest x-ray recheck is stable   Pleuritic chest pain: Check D-dimer-if positive will consider CTA chest in the setting of persistent hypoxia  T2DM with uncontrolled hyperglycemia: A1c of 11.6 a month ago.  Started on lantus > increased to 20 units, 18 units Premeal and keep SSI  Patient has high chances of noncompliance with diet and medication. Home glimepiride  and metformin  on hold. Diabetes coordinator consulted and patient has been educated on insulin  use-as she will need to go home on insulin  Recent Labs  Lab 06/13/24 1232 06/13/24 1659 06/13/24 2110 06/14/24 0543 06/14/24 1144  GLUCAP 213* 188* 222* 371* 152*    Hyponatremia: likely from hyperglycemia, monitor  Social issues: Patient might need more support at home. toc consulted.  Hypomagnesemia: Improved  Hypoalbuminemia: Augment diet RD following  Normocytic anemia: Some drop in hemoglobin holding stable -although on admission in 12. Checked anemia panel iron slightly low at 21 with normal ferritin B12 332 folate normal will put on B12 supplement for borderline B12  Substance abuse History of cocaine abuse Hx of crack cocaine use, + a month ago-  UDS positive for opiates Patient appears much older than her stated age.  She is residing at her father's house who is taking care of.  Mobility: PT Orders: Active PT Follow up Rec: Skilled Nursing-Short Term Rehab (<3 Hours/Day)06/12/2024 1500   DVT prophylaxis: enoxaparin  (LOVENOX ) injection 40 mg Start: 06/08/24 1100 SCDs Start: 06/08/24 1046 SCDs Start: 06/04/24 1922  Code Status:   Code Status: Full Code Family Communication: plan of care discussed with patient at bedside.  Father was updated at the bedside multiple times. Father was updated at the bedside previously Patient status is: Remains hospitalized because of severity of illness Level of care: Telemetry   Dispo: The patient is from: HOME            Anticipated disposition:Per TOC no SNF willing  to accept her.Patient and her father planning to take her home with home health upon discharge  Objective: Vitals last 24 hrs: Vitals:   06/13/24 2004 06/13/24 2355 06/14/24 0335 06/14/24 0745  BP: 129/70 136/75 118/76 120/85  Pulse: 79 72 77 74  Resp: 18 19 19 20   Temp: 98.5 F (36.9 C) 99.6 F (37.6 C) 98 F (36.7 C) 97.8 F (36.6 C)  TempSrc: Oral Oral Oral Oral  SpO2: 96% 96% 94% 91%  Weight:      Height:        Physical Examination: General exam: AAOX3, NAD HEENT:Oral mucosa moist, Ear/Nose WNL grossly Respiratory system: bilaterally clear breath sounds without wheezing accessory muscle Cardiovascular system: S1 & S2 +, No JVD. Gastrointestinal system: Abd soft,NT,ND, BS+ Nervous System: Alert, awake, moving all extremities,and following commands. Extremities: extremities warm, leg edema neg left foot toes visible with dressing in place, warm Skin: Warm, no rashes MSK: Normal muscle bulk,tone, power   Medications reviewed:  Scheduled Meds:  acetaminophen   975 mg Oral TID   vitamin B-12  500 mcg Oral Daily   enoxaparin  (LOVENOX ) injection  40 mg Subcutaneous Daily   feeding supplement (GLUCERNA SHAKE)  237 mL Oral BID BM   furosemide   20 mg Intravenous BID   insulin  aspart  0-5 Units Subcutaneous QHS   insulin  aspart  0-9 Units  Subcutaneous TID WC   insulin  aspart  8 Units Subcutaneous TID WC   insulin  glargine  20 Units Subcutaneous Daily   magnesium  oxide  400 mg Oral BID   multivitamin with minerals  1 tablet Oral Daily   nitroGLYCERIN   0.2 mg Transdermal Daily   nutrition supplement (JUVEN)  1 packet Oral BID WC   pantoprazole   40 mg Oral Daily   Continuous Infusions:  ampicillin -sulbactam (UNASYN ) IV 3 g (06/14/24 0916)   vancomycin  1,750 mg (06/12/24 1752)   Diet: Diet Order             Diet Carb Modified Fluid consistency: Thin; Room service appropriate? Yes  Diet effective now                    Data Reviewed: I have personally reviewed  following labs and imaging studies ( see epic result tab) CBC: Recent Labs  Lab 06/08/24 0218 06/09/24 0215 06/10/24 0213 06/12/24 0149 06/13/24 0156 06/14/24 0217  WBC 13.4* 11.7* 10.9* 8.4 11.2* 10.3  NEUTROABS 10.2*  --   --   --   --   --   HGB 9.3* 8.5* 8.8* 9.2* 9.8* 10.2*  HCT 30.4* 28.4* 29.0* 31.4* 32.9* 34.3*  MCV 89.1 87.1 87.9 88.7 87.0 86.8  PLT 193 215 253 337 407* 447*   CMP: Recent Labs  Lab 06/08/24 0218 06/09/24 0215 06/10/24 0213 06/11/24 1009 06/12/24 0149 06/13/24 0156 06/14/24 0217  NA 131*   < > 133* 139 138 138 135  K 4.5   < > 4.5 3.6 4.4 4.6 4.7  CL 88*   < > 91* 92* 93* 96* 94*  CO2 38*   < > 37* 40* 40* 32 34*  GLUCOSE 339*   < > 264* 67* 201* 239* 372*  BUN 22*   < > 18 8 10 19  24*  CREATININE 0.44   < > 0.59 0.54 0.61 0.91 0.57  CALCIUM  8.2*   < > 8.4* 8.6* 8.2* 8.8* 9.1  MG 1.7  --   --   --   --   --   --    < > = values in this interval not displayed.   GFR: Estimated Creatinine Clearance: 82.7 mL/min (by C-G formula based on SCr of 0.57 mg/dL). No results for input(s): AST, ALT, ALKPHOS, BILITOT, PROT, ALBUMIN in the last 168 hours.  No results for input(s): LIPASE, AMYLASE in the last 168 hours. No results for input(s): AMMONIA in the last 168 hours. Coagulation Profile: No results for input(s): INR, PROTIME in the last 168 hours. Unresulted Labs (From admission, onward)     Start     Ordered   06/15/24 0500  Creatinine, serum  (enoxaparin  (LOVENOX )    CrCl >/= 30 ml/min)  Weekly,   R     Comments: while on enoxaparin  therapy   Question:  Specimen collection method  Answer:  Lab=Lab collect   06/08/24 1045   06/14/24 1207  D-dimer, quantitative  Add-on,   AD        06/14/24 1206   06/08/24 1309  Expectorated Sputum Assessment w Gram Stain, Rflx to Resp Cult  Once,   R        06/08/24 1308   06/08/24 1308  Legionella Pneumophila Serogp 1 Ur Ag  Once,   R        06/08/24 1308   06/08/24 1308  Strep  pneumoniae urinary antigen  Once,   R  06/08/24 1308           Antimicrobials/Microbiology: Anti-infectives (From admission, onward)    Start     Dose/Rate Route Frequency Ordered Stop   06/08/24 1400  azithromycin  (ZITHROMAX ) 500 mg in sodium chloride  0.9 % 250 mL IVPB        500 mg 250 mL/hr over 60 Minutes Intravenous Every 24 hours 06/08/24 1309 06/10/24 1328   06/05/24 1700  vancomycin  (VANCOCIN ) 1,750 mg in sodium chloride  0.9 % 500 mL IVPB        1,750 mg 258.8 mL/hr over 120 Minutes Intravenous Every 24 hours 06/04/24 1945     06/04/24 2300  Ampicillin -Sulbactam (UNASYN ) 3 g in sodium chloride  0.9 % 100 mL IVPB        3 g 200 mL/hr over 30 Minutes Intravenous Every 6 hours 06/04/24 1945     06/04/24 1630  Vancomycin  (VANCOCIN ) 1,250 mg in sodium chloride  0.9 % 250 mL IVPB        1,250 mg 166.7 mL/hr over 90 Minutes Intravenous  Once 06/04/24 1622 06/04/24 1924   06/04/24 1630  piperacillin -tazobactam (ZOSYN ) IVPB 3.375 g        3.375 g 100 mL/hr over 30 Minutes Intravenous  Once 06/04/24 1622 06/04/24 1720   06/04/24 1615  vancomycin  (VANCOCIN ) 2,000 mg in sodium chloride  0.9 % 500 mL IVPB  Status:  Discontinued        2,000 mg 260 mL/hr over 120 Minutes Intravenous  Once 06/04/24 1611 06/04/24 1622   06/04/24 1615  piperacillin -tazobactam (ZOSYN ) IVPB 4.5 g  Status:  Discontinued        4.5 g 200 mL/hr over 30 Minutes Intravenous  Once 06/04/24 1611 06/04/24 1622         Component Value Date/Time   SDES BLOOD RIGHT ANTECUBITAL 06/05/2024 0110   SPECREQUEST  06/05/2024 0110    BOTTLES DRAWN AEROBIC AND ANAEROBIC Blood Culture adequate volume   CULT  06/05/2024 0110    NO GROWTH 5 DAYS Performed at [facility], [address], [place], [place] [address]    REPTSTATUS 06/10/2024 FINAL 06/05/2024 0110    Procedures:    Mennie LAMY, MD [facility] 06/14/2024, 12:07 PM

## 2024-06-14 NOTE — Progress Notes (Signed)
 Patient ID: Bianca Yang, female   DOB: December 24, 1975, 48 y.o.   MRN: [id number] Patient is status post fifth ray amputation left foot.  Examination the incision is healed proximally and distally there is a large eschar in the mid aspect of the wound.  Will have sutures harvested today.  Plan to continue Vashe dressing changes at discharge.

## 2024-06-14 NOTE — Progress Notes (Addendum)
 Patient got 9 sutures on left foot removed, 3 steri strips were added across the mid aspect of the wound. And purulent drainage was noted in open incision 5 x 0.75 cm. Continuing on Antibiotics.  Doctor Harden aware of eschar an wound purulent opening.

## 2024-06-15 ENCOUNTER — Ambulatory Visit (HOSPITAL_BASED_OUTPATIENT_CLINIC_OR_DEPARTMENT_OTHER)

## 2024-06-15 ENCOUNTER — Inpatient Hospital Stay (HOSPITAL_COMMUNITY)

## 2024-06-15 DIAGNOSIS — M7989 Other specified soft tissue disorders: Secondary | ICD-10-CM | POA: Diagnosis not present

## 2024-06-15 DIAGNOSIS — M86172 Other acute osteomyelitis, left ankle and foot: Secondary | ICD-10-CM | POA: Diagnosis not present

## 2024-06-15 LAB — BASIC METABOLIC PANEL WITH GFR
Anion gap: 4 — ABNORMAL LOW (ref 5–15)
BUN: 22 mg/dL — ABNORMAL HIGH (ref 6–20)
CO2: 37 mmol/L — ABNORMAL HIGH (ref 22–32)
Calcium: 9.2 mg/dL (ref 8.9–10.3)
Chloride: 94 mmol/L — ABNORMAL LOW (ref 98–111)
Creatinine, Ser: 0.5 mg/dL (ref 0.44–1.00)
GFR, Estimated: >60 mL/min (ref >60)
Glucose, Bld: 265 mg/dL — ABNORMAL HIGH (ref 70–99)
Potassium: 4.7 mmol/L (ref 3.5–5.1)
Sodium: 136 mmol/L (ref 135–145)

## 2024-06-15 LAB — CBC
HCT: 35.7 % — ABNORMAL LOW (ref 36.0–46.0)
Hemoglobin: 10.5 g/dL — ABNORMAL LOW (ref 12.0–15.0)
MCH: 25.9 pg — ABNORMAL LOW (ref 26.0–34.0)
MCHC: 29.4 g/dL — ABNORMAL LOW (ref 30.0–36.0)
MCV: 87.9 fL (ref 80.0–100.0)
Platelets: 460 K/uL — ABNORMAL HIGH (ref 150–400)
RBC: 4.06 MIL/uL (ref 3.87–5.11)
RDW: 14.7 % (ref 11.5–15.5)
WBC: 11.7 K/uL — ABNORMAL HIGH (ref 4.0–10.5)
nRBC: 0 % (ref 0.0–0.2)

## 2024-06-15 LAB — GLUCOSE, CAPILLARY
Glucose-Capillary: 241 mg/dL — ABNORMAL HIGH (ref 70–99)
Glucose-Capillary: 220 mg/dL — ABNORMAL HIGH (ref 70–99)
Glucose-Capillary: 134 mg/dL — ABNORMAL HIGH (ref 70–99)
Glucose-Capillary: 133 mg/dL — ABNORMAL HIGH (ref 70–99)
Glucose-Capillary: 180 mg/dL — ABNORMAL HIGH (ref 70–99)

## 2024-06-15 MED ORDER — BUDESONIDE 0.25 MG/2ML IN SUSP
0.2500 mg | Freq: Two times a day (BID) | RESPIRATORY_TRACT | Status: DC
  Administered 2024-06-15 – 2024-06-20 (×11): 0.25 mg via RESPIRATORY_TRACT
  Filled 2024-06-15 (×11): qty 2

## 2024-06-15 MED ORDER — REVEFENACIN 175 MCG/3ML IN SOLN
175.0000 ug | Freq: Every day | RESPIRATORY_TRACT | Status: DC
  Administered 2024-06-15 – 2024-06-20 (×6): 175 ug via RESPIRATORY_TRACT
  Filled 2024-06-15 (×7): qty 3

## 2024-06-15 MED ORDER — ARFORMOTEROL TARTRATE 15 MCG/2ML IN NEBU
15.0000 ug | INHALATION_SOLUTION | Freq: Two times a day (BID) | RESPIRATORY_TRACT | Status: DC
  Administered 2024-06-15 – 2024-06-20 (×11): 15 ug via RESPIRATORY_TRACT
  Filled 2024-06-15 (×11): qty 2

## 2024-06-15 NOTE — Progress Notes (Signed)
 Patient level of care downgraded.  New bed assignment received for 5N03.  Report called to receiving nurse, Linh RN on [address] [place].    06/15/24 1735  Hand-off documentation  Hand-off Given Given to shift RN/LPN  Report given to (Full Name) Amado RN

## 2024-06-15 NOTE — Progress Notes (Signed)
 Pt admitted to rm 3 from [place]. Initiated tele. Oriented pt to the unit. Obtained vs. Call bell within reach.  Amado GORMAN Arabia, RN

## 2024-06-15 NOTE — Plan of Care (Signed)
  Problem: Skin Integrity: Goal: Risk for impaired skin integrity will decrease Outcome: Progressing   Problem: Education: Goal: Knowledge of General Education information will improve Description: Including pain rating scale, medication(s)/side effects and non-pharmacologic comfort measures Outcome: Progressing   Problem: Safety: Goal: Ability to remain free from injury will improve Outcome: Progressing

## 2024-06-15 NOTE — Progress Notes (Signed)
 Changed dressing in L foot per MD order.   Amado GORMAN Arabia, RN

## 2024-06-15 NOTE — Progress Notes (Signed)
 Transferred to 5N room 5N03 at this time.  Bedside handoff completed with Amado RN and Clotilda RN at this time

## 2024-06-15 NOTE — Plan of Care (Signed)
 Alert and oriented.  Bowel movement noted today.  Level of care down-graded, new bed assignment received for 5N03.  Patient and family updated on plan of care.   Problem: Education: Goal: Ability to describe self-care measures that may prevent or decrease complications (Diabetes Survival Skills Education) will improve Outcome: Progressing Goal: Individualized Educational Video(s) Outcome: Progressing   Problem: Coping: Goal: Ability to adjust to condition or change in health will improve Outcome: Progressing   Problem: Fluid Volume: Goal: Ability to maintain a balanced intake and output will improve Outcome: Progressing   Problem: Health Behavior/Discharge Planning: Goal: Ability to identify and utilize available resources and services will improve Outcome: Progressing Goal: Ability to manage health-related needs will improve Outcome: Progressing   Problem: Metabolic: Goal: Ability to maintain appropriate glucose levels will improve Outcome: Progressing   Problem: Nutritional: Goal: Maintenance of adequate nutrition will improve Outcome: Progressing Goal: Progress toward achieving an optimal weight will improve Outcome: Progressing   Problem: Skin Integrity: Goal: Risk for impaired skin integrity will decrease Outcome: Progressing   Problem: Tissue Perfusion: Goal: Adequacy of tissue perfusion will improve Outcome: Progressing   Problem: Education: Goal: Knowledge of General Education information will improve Description: Including pain rating scale, medication(s)/side effects and non-pharmacologic comfort measures Outcome: Progressing   Problem: Health Behavior/Discharge Planning: Goal: Ability to manage health-related needs will improve Outcome: Progressing   Problem: Clinical Measurements: Goal: Ability to maintain clinical measurements within normal limits will improve Outcome: Progressing Goal: Will remain free from infection Outcome: Progressing Goal:  Diagnostic test results will improve Outcome: Progressing Goal: Respiratory complications will improve Outcome: Progressing Goal: Cardiovascular complication will be avoided Outcome: Progressing   Problem: Activity: Goal: Risk for activity intolerance will decrease Outcome: Progressing   Problem: Nutrition: Goal: Adequate nutrition will be maintained Outcome: Progressing   Problem: Coping: Goal: Level of anxiety will decrease Outcome: Progressing   Problem: Elimination: Goal: Will not experience complications related to bowel motility Outcome: Progressing Goal: Will not experience complications related to urinary retention Outcome: Progressing   Problem: Pain Managment: Goal: General experience of comfort will improve and/or be controlled Outcome: Progressing   Problem: Safety: Goal: Ability to remain free from injury will improve Outcome: Progressing   Problem: Skin Integrity: Goal: Risk for impaired skin integrity will decrease Outcome: Progressing

## 2024-06-15 NOTE — Progress Notes (Signed)
 Occupational Therapy Treatment Patient Details Name: Bianca Yang MRN: [id number] DOB: 1975/12/08 Today's Date: 06/15/2024   History of present illness Pt is a 48 y.o. 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 IMG done on 12/11 suggesting multifocal PNA. Stitches removed on 12/17. PMH is significant for DM, cocaine abuse.   OT comments  Reinforced compensatory strategies for ADLs, leaning side to side for pericare and LB bathing and dressing. Pt completed squat pivot to BSC and pericare modified independently and seated grooming with set up. Provided and instructed in use of IS 10x per hour. Pt adheres to NWB on L LE consistently. Pt with SpO2 of 92% on 6L O2. Encouraged OOB to bathroom and to recliner for more activity. Updated DME recommendation in the event pt progresses to home with her dad.       If plan is discharge home, recommend the following:  A little help with walking and/or transfers;A little help with bathing/dressing/bathroom;Assistance with cooking/housework;Assist for transportation;Help with stairs or ramp for entrance   Equipment Recommendations  Wheelchair (measurements OT);Wheelchair cushion (measurements OT);BSC/3in1    Recommendations for Other Services      Precautions / Restrictions Precautions Precautions: Fall Recall of Precautions/Restrictions: Intact Restrictions Weight Bearing Restrictions Per Provider Order: Yes LLE Weight Bearing Per Provider Order: Non weight bearing       Mobility Bed Mobility Overal bed mobility: Modified Independent                  Transfers Overall transfer level: Modified independent   Transfers: Bed to chair/wheelchair/BSC     Squat pivot transfers: Modified independent (Device/Increase time)       General transfer comment: to and from [facility] without assist     Balance Overall balance assessment: Needs assistance   Sitting  balance-Leahy Scale: Good     Standing balance support: Bilateral upper extremity supported, During functional activity, Reliant on assistive device for balance Standing balance-Leahy Scale: Poor                             ADL either performed or assessed with clinical judgement   ADL Overall ADL's : Needs assistance/impaired     Grooming: Wash/dry hands;Sitting;Set up               Lower Body Dressing: Set up;Sitting/lateral leans;Cueing for safety   Toilet Transfer: Modified Independent;Squat-pivot;BSC/3in1   Toileting- Clothing Manipulation and Hygiene: Modified independent;Sitting/lateral lean         General ADL Comments: Provided IS and encouraged use 10x an hour as well as OOB to chair.    Extremity/Trunk Assessment              Vision       Rest[employer] Communication: No apparent difficulties   Cognition Arousal: Alert Behavior During Therapy: WFL for tasks assessed/performed Cognition: No family/caregiver present to determine baseline             OT - Cognition Comments: pt stating she needs to get up and move so she can walk to the bathroom, then wanted to use the [facility] instead                 Following commands: Intact        Cueing   Cueing Techniques: Verbal cues  Exercises  Shoulder Instructions       General Comments      Pertinent Vitals/ Pain       Pain Assessment Pain Assessment: No/denies pain  Home Living                                          Prior Functioning/Environment              Frequency           Progress Toward Goals  OT Goals(current goals can now be found in the care plan section)  Progress towards OT goals: Progressing toward goals     Plan      Co-evaluation                 AM-PAC OT 6 Clicks Daily Activity     Outcome Measure   Help from another person eating meals?: None Help from  another person taking care of personal grooming?: A Little Help from another person toileting, which includes using toliet, bedpan, or urinal?: None Help from another person bathing (including washing, rinsing, drying)?: A Little Help from another person to put on and taking off regular upper body clothing?: A Little Help from another person to put on and taking off regular lower body clothing?: A Little 6 Click Score: 20    End of Session Equipment Utilized During Treatment: Gait belt;Oxygen (6L)  OT Visit Diagnosis: Unsteadiness on feet (R26.81);Other abnormalities of gait and mobility (R26.89);Other symptoms and signs involving cognitive function   Activity Tolerance Patient tolerated treatment well   Patient Left in bed;with call bell/phone within reach;with family/visitor present   Nurse Communication          Time: 8476-8450 OT Time Calculation (min): 26 min  Charges: OT General Charges $OT Visit: 1 Visit OT Treatments $Self Care/Home Management : 23-37 mins  Mliss HERO, OTR/L Acute Rehabilitation Services Office: [phone]   Kennth Mliss Helling 06/15/2024, 4:06 PM

## 2024-06-15 NOTE — Progress Notes (Signed)
 Bilateral lower extremity venous duplex has been completed. Preliminary results can be found in CV Proc through chart review.   06/15/2024 11:00 AM Cathlyn Collet RVT

## 2024-06-15 NOTE — Progress Notes (Signed)
 PROGRESS NOTE Bianca Yang  FMW:[id number] DOB: 1975/10/31 DOA: 06/04/2024 PCP: Patient, [name] Per  Brief Narrative/Hospital Course: Bianca Yang is a 48 y.o. female with past medical history significant of DM2, diabetic foot ulcer, OSA, PAD presented to hospital with left foot pain and swelling.  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  In the ED patient was afebrile.  Labs were notable for mild leukocytosis but lactate was within normal range.  BMP showed mild hyponatremia with sodium of 131 with a glucose elevation at 483.  ESR was elevated at 41 with CRP of 2.9.  X-ray of the left foot showed large lateral midfoot ulceration with lytic destruction of the cuboid and proximal base of the fourth metatarsal suspicious osteomyelitis.  CT scan of the foot was then performed which showed acute osteomyelitis involving the fourth metatarsal and cuboid.  Patient was then considered for admission to the hospital for further evaluation and treatment Patient continued on IV antibiotics.  Also noted to be hypercapnic and hypoxic needing HFNC 12/11: Seen by PCCM  Subjective: Seen and examined Alert awake resting comfortably denies any new complaints Again mostly on the bed however she states she has been ambulating at times On 6 L [place] now, encouraged use of I-S  Assessment and plan:  Diabetic foot infection with acute osteomyelitis of the left foot metatarsal, wound dehiscence S/P fifth ray amputation of left foot: Inflammation marker CRP ESR up. CT scan and x-ray of the foot suggestive of acute osteomyelitis. 12/8 ABI normal . Dr. Fortino NWB LLE- stated there is bone marrow edema with no plans for surgical intervention at this time. Blood culture remains sterile Patient has had fever of 102 F 12/10-likely from pneumonia and has been afebrile since and WBC  improved Had a fall 12/10 while trying to go to the bathroom.Encouraged and educated on NWB. Seen by ID managed with Vanco/Unasyn -Dr. Harden evaluated 11/17-overall healing but did have purulent drainage noted in the open incision, 9 sutures were removed from the left foot and 3 Steri-Strips added> per his and ID recommendation switch to oral antibiotics-needs for 6 weeks.  Reported obstructive sleep apnea/OSA-noncompliant Acute on chronic hypoxia and hypercapnea Acute mild systolic dysfunction: Patient with history of sleep apnea (?) not compliant with CPAP-adamantly refusing here. VBG in the ED-pH 7.38 pCO2 68 pO2 52-with similar hypercapnia on recheck and seen by PCCM patient refused CPAP, has been needing HFNC in the setting of multifocal pneumonia>CXR recheck 12/16-mild patchy interstitial prominence with a small effusion and associated atelectasis versus infection  Sill needing hfnc> continue wean down oxygen, also getting diuresis given mild systolic dysfunction TTE- obtained: EF 45-50% -no previous echo to compare has mildly decreased LV function no RWMA RV SF mildly reduced RV size mildly enlarged mitral valve is abnormal-mild MR, no evidence of stenosis.  No evidence of fluid overload BNP 208. She will need outpatient further follow-up with cardiology.  She denies any chest pain.  Patient being managed with Lasix  in the setting of hypoxia-renal function is stable She had some pleuritic chest pain D-dimer was elevated 12/17 and CT angio chest obtained no PE-showing consolidation as below Continue supplemental oxygen, diuretics antibiotics incentive spirometry and wean oxygen as tolerated Unfortunately she continues to require HFNC, will add LABA LAMA ICS nebs  MultifocaL Pneumonia Tobacco abuse-quit recently: CXR  12/11 showed multifocal pneumonia-antibiotic adjusted to  cover CAP and seen by ID Continues to be hypoxic, CT angio chest 12/17:Mild patchy airspace consolidation in the bilateral  lower lobes concerning for pneumonia. Scattered subsolid and GGO nodules measuring up to 5 mm.  Continue supplemental oxygen and antibiotics as above.  Continue pulmonary toileting Of note patient was seen by PCCM in the setting of hypoxia and hypercapnia and multifocal pneumonia Urine antigens ordered unable to be collected. She smoked and quit a month ago. Less than pack a day x 28 yrs  Pleuritic chest pain Elevated D-dimer: No PE on CTA, duplex 12/18 negative for DVT in legs  T2DM with uncontrolled hyperglycemia: A1c of 11.6 a month ago.  Started on lantus > increased to 20 units, 8 units Premeal and SSI.  Patient has high chances of noncompliance with diet and medication. Home glimepiride  and metformin  on hold. Diabetes coordinator consulted and patient has been educated on insulin  use-as she will need to go home on insulin  Recent Labs  Lab 06/14/24 1144 06/14/24 1718 06/14/24 2037 06/15/24 0608 06/15/24 0722  GLUCAP 152* 154* 151* 241* 220*    Hyponatremia: likely from hyperglycemia, monitor  Social issues: Patient might need more support at home. toc consulted.  Hypomagnesemia: Improved  Hypoalbuminemia: Augment diet RD following  Normocytic anemia: Some drop in hemoglobin holding stable -although on admission in 12. Checked anemia panel iron slightly low at 21 with normal ferritin B12 332 folate normal will put on B12 supplement for borderline B12  Substance abuse History of cocaine abuse Hx of crack cocaine use, + a month ago-  UDS positive for opiates Patient appears much older than her stated age.  She is residing at her father's house who is taking care of.  Mobility: PT Orders: Active PT Follow up Rec: Skilled Nursing-Short Term Rehab (<3 Hours/Day)06/14/2024 1400   DVT prophylaxis: enoxaparin  (LOVENOX ) injection 40 mg Start: 06/08/24 1100 SCDs Start: 06/08/24 1046 SCDs Start: 06/04/24 1922  Code Status:   Code Status: Full Code Family Communication: plan of  care discussed with patient at bedside.  Father was updated at the bedside multiple times, called to update   12/18 no answer Patient status is: Remains hospitalized because of severity of illness Level of care: Telemetry   Dispo: The patient is from: HOME            Anticipated disposition:Per TOC no SNF willing to accept her.Patient and her father planning to take her home with home health upon discharge  Objective: Vitals last 24 hrs: Vitals:   06/15/24 0018 06/15/24 0450 06/15/24 0753 06/15/24 0926  BP: 117/73 128/74 114/72   Pulse: 73 75 74 84  Resp: 18 20 18 18   Temp: 98.3 F (36.8 C) 98.3 F (36.8 C) 98.4 F (36.9 C)   TempSrc: Oral Oral Oral   SpO2: 93% 94% 91% 95%  Weight:      Height:        Physical Examination: General exam: AAOX3, thin frail older than her age HEENT:Oral mucosa moist, Ear/Nose WNL grossly Respiratory system: CTA B/L. Cardiovascular system: S1 & S2 +, No JVD. Gastrointestinal system: Abd soft,NT,ND, BS+ Nervous System: Alert, awake, moving all extremities,and following commands. Extremities: extremities warm, left foot site fifth ray amputation DRESSING + Skin: Warm, no rashes MSK: Normal muscle bulk,tone, power   Medications reviewed:  Scheduled Meds:  acetaminophen   975 mg Oral TID   amoxicillin -clavulanate  1 tablet Oral Q12H   arformoterol   15 mcg Nebulization BID   budesonide  (PULMICORT ) nebulizer solution  0.25 mg  Nebulization BID   vitamin B-12  500 mcg Oral Daily   enoxaparin  (LOVENOX ) injection  40 mg Subcutaneous Daily   feeding supplement (GLUCERNA SHAKE)  237 mL Oral BID BM   furosemide   20 mg Intravenous BID   insulin  aspart  0-5 Units Subcutaneous QHS   insulin  aspart  0-9 Units Subcutaneous TID WC   insulin  aspart  8 Units Subcutaneous TID WC   insulin  glargine  20 Units Subcutaneous Daily   linezolid   600 mg Oral Q12H   magnesium  oxide  400 mg Oral BID   multivitamin with minerals  1 tablet Oral Daily   nitroGLYCERIN    0.2 mg Transdermal Daily   nutrition supplement (JUVEN)  1 packet Oral BID WC   pantoprazole   40 mg Oral Daily   revefenacin   175 mcg Nebulization Daily   Continuous Infusions:   Diet: Diet Order             Diet Carb Modified Fluid consistency: Thin; Room service appropriate? Yes  Diet effective now                    Data Reviewed: I have personally reviewed following labs and imaging studies ( see epic result tab) CBC: Recent Labs  Lab 06/10/24 0213 06/12/24 0149 06/13/24 0156 06/14/24 0217 06/15/24 0701  WBC 10.9* 8.4 11.2* 10.3 11.7*  HGB 8.8* 9.2* 9.8* 10.2* 10.5*  HCT 29.0* 31.4* 32.9* 34.3* 35.7*  MCV 87.9 88.7 87.0 86.8 87.9  PLT 253 337 407* 447* 460*   CMP: Recent Labs  Lab 06/11/24 1009 06/12/24 0149 06/13/24 0156 06/14/24 0217 06/15/24 0701  NA 139 138 138 135 136  K 3.6 4.4 4.6 4.7 4.7  CL 92* 93* 96* 94* 94*  CO2 40* 40* 32 34* 37*  GLUCOSE 67* 201* 239* 372* 265*  BUN 8 10 19  24* 22*  CREATININE 0.54 0.61 0.91 0.57 0.50  CALCIUM  8.6* 8.2* 8.8* 9.1 9.2   GFR: Estimated Creatinine Clearance: 82.7 mL/min (by C-G formula based on SCr of 0.5 mg/dL). No results for input(s): AST, ALT, ALKPHOS, BILITOT, PROT, ALBUMIN in the last 168 hours.  No results for input(s): LIPASE, AMYLASE in the last 168 hours. No results for input(s): AMMONIA in the last 168 hours. Coagulation Profile: No results for input(s): INR, PROTIME in the last 168 hours. Unresulted Labs (From admission, onward)     Start     Ordered   06/16/24 0500  Basic metabolic panel with GFR  Tomorrow morning,   R       Question:  Specimen collection method  Answer:  Lab=Lab collect   06/15/24 0823   06/15/24 0500  Creatinine, serum  (enoxaparin  (LOVENOX )    CrCl >/= 30 ml/min)  Weekly,   R     Comments: while on enoxaparin  therapy   Question:  Specimen collection method  Answer:  Lab=Lab collect   06/08/24 1045            Antimicrobials/Microbiology: Anti-infectives (From admission, onward)    Start     Dose/Rate Route Frequency Ordered Stop   06/14/24 1500  amoxicillin -clavulanate (AUGMENTIN ) 875-125 MG per tablet 1 tablet        1 tablet Oral Every 12 hours 06/14/24 1330     06/14/24 1430  linezolid  (ZYVOX ) tablet 600 mg        600 mg Oral Every 12 hours 06/14/24 1330     06/08/24 1400  azithromycin  (ZITHROMAX ) 500 mg in sodium chloride  0.9 %  250 mL IVPB        500 mg 250 mL/hr over 60 Minutes Intravenous Every 24 hours 06/08/24 1309 06/10/24 1328   06/05/24 1700  vancomycin  (VANCOCIN ) 1,750 mg in sodium chloride  0.9 % 500 mL IVPB  Status:  Discontinued        1,750 mg 258.8 mL/hr over 120 Minutes Intravenous Every 24 hours 06/04/24 1945 06/14/24 1330   06/04/24 2300  Ampicillin -Sulbactam (UNASYN ) 3 g in sodium chloride  0.9 % 100 mL IVPB  Status:  Discontinued        3 g 200 mL/hr over 30 Minutes Intravenous Every 6 hours 06/04/24 1945 06/14/24 1330   06/04/24 1630  Vancomycin  (VANCOCIN ) 1,250 mg in sodium chloride  0.9 % 250 mL IVPB        1,250 mg 166.7 mL/hr over 90 Minutes Intravenous  Once 06/04/24 1622 06/04/24 1924   06/04/24 1630  piperacillin -tazobactam (ZOSYN ) IVPB 3.375 g        3.375 g 100 mL/hr over 30 Minutes Intravenous  Once 06/04/24 1622 06/04/24 1720   06/04/24 1615  vancomycin  (VANCOCIN ) 2,000 mg in sodium chloride  0.9 % 500 mL IVPB  Status:  Discontinued        2,000 mg 260 mL/hr over 120 Minutes Intravenous  Once 06/04/24 1611 06/04/24 1622   06/04/24 1615  piperacillin -tazobactam (ZOSYN ) IVPB 4.5 g  Status:  Discontinued        4.5 g 200 mL/hr over 30 Minutes Intravenous  Once 06/04/24 1611 06/04/24 1622         Component Value Date/Time   SDES BLOOD RIGHT ANTECUBITAL 06/05/2024 0110   SPECREQUEST  06/05/2024 0110    BOTTLES DRAWN AEROBIC AND ANAEROBIC Blood Culture adequate volume   CULT  06/05/2024 0110    NO GROWTH 5 DAYS Performed at [facility], [address], [place], [place] [address]    REPTSTATUS 06/10/2024 FINAL 06/05/2024 0110    Procedures:    Mennie LAMY, MD [facility] 06/15/2024, 11:19 AM

## 2024-06-16 LAB — GLUCOSE, CAPILLARY
Glucose-Capillary: 305 mg/dL — ABNORMAL HIGH (ref 70–99)
Glucose-Capillary: 103 mg/dL — ABNORMAL HIGH (ref 70–99)
Glucose-Capillary: 162 mg/dL — ABNORMAL HIGH (ref 70–99)
Glucose-Capillary: 194 mg/dL — ABNORMAL HIGH (ref 70–99)

## 2024-06-16 LAB — BASIC METABOLIC PANEL WITH GFR
Anion gap: 9 (ref 5–15)
BUN: 29 mg/dL — ABNORMAL HIGH (ref 6–20)
CO2: 31 mmol/L (ref 22–32)
Calcium: 9.4 mg/dL (ref 8.9–10.3)
Chloride: 93 mmol/L — ABNORMAL LOW (ref 98–111)
Creatinine, Ser: 0.62 mg/dL (ref 0.44–1.00)
GFR, Estimated: >60 mL/min
Glucose, Bld: 314 mg/dL — ABNORMAL HIGH (ref 70–99)
Potassium: 4.7 mmol/L (ref 3.5–5.1)
Sodium: 133 mmol/L — ABNORMAL LOW (ref 135–145)

## 2024-06-16 MED ORDER — OXYCODONE HCL 5 MG PO TABS
5.0000 mg | ORAL_TABLET | Freq: Two times a day (BID) | ORAL | Status: DC | PRN
  Administered 2024-06-17 – 2024-06-22 (×9): 5 mg via ORAL
  Filled 2024-06-16 (×9): qty 1

## 2024-06-16 NOTE — Plan of Care (Signed)

## 2024-06-16 NOTE — Plan of Care (Signed)
  Problem: Education: Goal: Knowledge of General Education information will improve Description: Including pain rating scale, medication(s)/side effects and non-pharmacologic comfort measures Outcome: Progressing   Problem: Clinical Measurements: Goal: Ability to maintain clinical measurements within normal limits will improve Outcome: Progressing Goal: Cardiovascular complication will be avoided Outcome: Progressing   Problem: Nutrition: Goal: Adequate nutrition will be maintained Outcome: Progressing   Problem: Coping: Goal: Level of anxiety will decrease Outcome: Progressing   

## 2024-06-16 NOTE — Progress Notes (Signed)
 Physical Therapy Treatment Patient Details Name: Bianca Yang MRN: [id number] DOB: 1976/03/01 Today's Date: 06/16/2024   History of Present Illness Pt is a 48 y.o. 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 Dehisence of wound. IMG done on 12/11 suggesting multifocal PNA. Stitches removed on 12/17. PMH is significant for DM, cocaine abuse.    PT Comments  Pt admitted with above diagnosis. Upon arrival to room, pt was ambulating from bathroom without RW and was weight bearing on left foot.  When pt was asked why she didn't have RW, pt became defensive and stated,  I had to pee and noone would come.  Pt agreed to ambulate with RW with PT.  Pt was on 6LO2 and needing 6LO2 to keep sats > 92% at rest and with activity. Pt incr disance with ambulation and follows NWB left LE without cues.  Pt at times impulsive but overall good safety with RW with good progress.  States her father will be assisting her at home.  Will follow acutely.  Pt currently with functional limitations due to the deficits listed below (see PT Problem List). Pt will benefit from acute skilled PT to increase their independence and safety with mobility to allow discharge.       If plan is discharge home, recommend the following: A lot of help with walking and/or transfers;A lot of help with bathing/dressing/bathroom;Assistance with cooking/housework;Direct supervision/assist for medications management;Direct supervision/assist for financial management;Assist for transportation;Help with stairs or ramp for entrance   Can travel by private vehicle        Equipment Recommendations  Wheelchair (measurements PT);Wheelchair cushion (measurements PT);BSC/3in1;Other (comment);Rolling walker (2 wheels) (tub bench)    Recommendations for Other Services       Precautions / Restrictions Precautions Precautions: Fall Recall of Precautions/Restrictions:  Intact Restrictions Weight Bearing Restrictions Per Provider Order: Yes LLE Weight Bearing Per Provider Order: Non weight bearing Other Position/Activity Restrictions: On arrival, pt was walking out of bathroom without device and was bearing weight on left LE.  Pt almost back to bed when PT walked in. Pt states she had to pee and noone came. Pt educated that she was supposed to be NWB left LE and pt stated she was sorry she had to pee.  Also pt had removed her 6LO2 and her sat on RA once back to bed was 80%.  REplaced O2 and incr to 90% with 3L therefore had to ultimately incr to 6LO2 to keep > 92%. Pt demonstrating good understanding of WB restrictions the rest of the session, upholding them throughout session without cueing from therapist.     Mobility  Bed Mobility Overal bed mobility: Modified Independent                  Transfers Overall transfer level: Modified independent Equipment used: Rolling walker (2 wheels) Transfers: Bed to chair/wheelchair/BSC       Squat pivot transfers: Supervision     General transfer comment: Somewhat impulsive hwoever no help needed.    Ambulation/Gait Ambulation/Gait assistance: Contact guard assist Gait Distance (Feet): 90 Feet Assistive device: Rolling walker (2 wheels) Gait Pattern/deviations: Trunk flexed, Step-to pattern Gait velocity: decreased Gait velocity interpretation: <1.8 ft/sec, indicate of risk for recurrent falls   General Gait Details: Pt demonstrating hop to gait pattern with increased reliance on RW for stability. Pt did not desaturate below 90% on 6LO2 with activity and pt needing  and cues for deep  breathing with DOE 2/4.  Pt reports she needed to sit down due to not having enough air as well as UEs get tired with her hoppping. some impulsivitiy noted needing cues to slow down at times.   Stairs             Wheelchair Mobility     Tilt Bed    Modified Rankin (Stroke Patients Only)       Balance  Overall balance assessment: Needs assistance Sitting-balance support: No upper extremity supported, Feet supported Sitting balance-Leahy Scale: Good Sitting balance - Comments: seated EOB   Standing balance support: Bilateral upper extremity supported, During functional activity, Reliant on assistive device for balance Standing balance-Leahy Scale: Poor Standing balance comment: reliant on RW for support, NWB to LLE                            Communication Communication Communication: No apparent difficulties Factors Affecting Communication: Difficulty expressing self  Cognition Arousal: Alert Behavior During Therapy: WFL for tasks assessed/performed   PT - Cognitive impairments: Awareness, Attention, Initiation, Sequencing, Problem solving                       PT - Cognition Comments: Issues today when therapist not here with pt following precautions Following commands: Intact      Cueing Cueing Techniques: Verbal cues  Exercises General Exercises - Lower Extremity Ankle Circles/Pumps: AROM, Both, 10 reps, Supine Quad Sets: AROM, Both, 10 reps, Supine Long Arc Quad: AROM, Both, 10 reps, Seated Heel Slides: AROM, Both, 10 reps, Supine    General Comments        Pertinent Vitals/Pain Pain Assessment Pain Assessment: No/denies pain    Home Living                          Prior Function            PT Goals (current goals can now be found in the care plan section) Acute Rehab PT Goals Patient Stated Goal: to get better Progress towards PT goals: Progressing toward goals    Frequency    Min 2X/week      PT Plan      Co-evaluation              AM-PAC PT 6 Clicks Mobility   Outcome Measure  Help needed turning from your back to your side while in a flat bed without using bedrails?: A Little Help needed moving from lying on your back to sitting on the side of a flat bed without using bedrails?: A Little Help needed  moving to and from a bed to a chair (including a wheelchair)?: A Little Help needed standing up from a chair using your arms (e.g., wheelchair or bedside chair)?: A Little Help needed to walk in hospital room?: A Little Help needed climbing 3-5 steps with a railing? : Total 6 Click Score: 16    End of Session Equipment Utilized During Treatment: Oxygen;Gait belt Activity Tolerance: Patient limited by fatigue Patient left: with call bell/phone within reach;in chair;with chair alarm set Nurse Communication: Mobility status;Weight bearing status PT Visit Diagnosis: Unsteadiness on feet (R26.81);Muscle weakness (generalized) (M62.81);Pain     Time: 8863-8847 PT Time Calculation (min) (ACUTE ONLY): 16 min  Charges:    $Gait Training: 8-22 mins PT General Charges $$ ACUTE PT VISIT: 1 Visit  [facility] M,PT Acute Rehab Services [phone]    Stephane JULIANNA Bevel 06/16/2024, 2:37 PM

## 2024-06-16 NOTE — Progress Notes (Signed)
 PT Cancellation Note  Patient Details Name: JERONICA STLOUIS MRN: [id number] DOB: Jun 02, 1976   Cancelled Treatment:    Reason Eval/Treat Not Completed: Other (comment) (RT in room giving multiple breathing treatments. Will return as able.)   Stephane JULIANNA Bevel 06/16/2024, 8:53 AM Letitia Sabala M,PT Acute Rehab Services [phone]

## 2024-06-16 NOTE — Progress Notes (Signed)
 " PROGRESS NOTE Bianca Yang  FMW:[id number] DOB: 1975-08-29 DOA: 06/04/2024 PCP: Patient, [name] Per  Brief Narrative/Hospital Course: Bianca Yang is a 48 y.o. female with past medical history significant of DM2, diabetic foot ulcer, OSA, PAD presented to hospital with left foot pain and swelling.  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  In the ED patient was afebrile.  Labs were notable for mild leukocytosis but lactate was within normal range.  BMP showed mild hyponatremia with sodium of 131 with a glucose elevation at 483.  ESR was elevated at 41 with CRP of 2.9.  X-ray of the left foot showed large lateral midfoot ulceration with lytic destruction of the cuboid and proximal base of the fourth metatarsal suspicious osteomyelitis.  CT scan of the foot was then performed which showed acute osteomyelitis involving the fourth metatarsal and cuboid.  Patient was then considered for admission to the hospital for further evaluation and treatment Patient continued on IV antibiotics.  Also noted to be hypercapnic and hypoxic needing HFNC 12/11: Seen by PCCM  Subjective: Seen and examined Alert awake oriented Working on incentive spirometry Overnight remains afebrile BP stable still on 6 L HFNC Labs reviewed with hyperglycemia 133-300 Discussed with nursing staff to wean down oxygen to minimize oxy use, stop Ativan   Assessment and plan:  Diabetic foot infection with acute osteomyelitis of the left foot metatarsal, wound dehiscence S/P fifth ray amputation of left foot: Inflammation marker CRP ESR up. CT scan and x-ray of the foot suggestive of acute osteomyelitis. 12/8 ABI normal . Dr. Fortino NWB LLE- stated there is bone marrow edema with no plans for surgical intervention at this time. Blood culture remains sterile Patient has had fever of 102 F 12/10-likely  from pneumonia and has been afebrile since and WBC improved Had a fall 12/10 while trying to go to the bathroom.Encouraged and educated on NWB. Seen by ID managed with Vanco/Unasyn -Dr. Harden evaluated 11/17-overall healing but did have purulent drainage noted in the open incision, 9 sutures were removed from the left foot and 3 Steri-Strips added> per Dr Harden and ID recs>switched to Augmentin /Zyvox  1218 EOT 6 weeks.  Reported obstructive sleep apnea/OSA-noncompliant Acute on chronic hypoxia and hypercapnea Acute mild systolic dysfunction: Patient with history of sleep apnea (?) not compliant with CPAP-adamantly refusing here. VBG in the ED-pH 7.38 pCO2 68 pO2 52-with similar hypercapnia on recheck and seen by PCCM patient refused CPAP, has been needing HFNC in the setting of multifocal pneumonia>CXR recheck 12/16-mild patchy interstitial prominence with a small effusion and associated atelectasis versus infection  Sill needing hfnc> continue wean down oxygen, also getting diuresis given mild systolic dysfunction TTE- obtained: EF 45-50% -no previous echo to compare has mildly decreased LV function no RWMA RV SF mildly reduced RV size mildly enlarged mitral valve is abnormal-mild MR, no evidence of stenosis.  No evidence of fluid overload BNP 208. She will need outpatient further follow-up with cardiology.  She denies any chest pain.  Patient being managed with Lasix  in the setting of hypoxia-renal function is stable. CT angio chest obtained no PE-showing consolidation as below Continue supplemental oxygen, diuretics antibiotics incentive spirometry and wean oxygen as tolerated Unfortunately she continues to require HFNC continue LABA LAMA ICS nebs  MultifocaL Pneumonia Tobacco abuse-quit recently: CXR  12/11 showed multifocal pneumonia-antibiotic adjusted to cover CAP and seen by ID  Continues to be hypoxic, CT angio chest 12/17:Mild patchy airspace consolidation in the bilateral lower lobes  concerning for pneumonia. Scattered subsolid and GGO nodules measuring up to 5 mm.  Continue supplemental oxygen and antibiotics as above.  Continue pulmonary toileting. Of note patient was seen by PCCM in the setting of hypoxia and hypercapnia and multifocal pneumonia Urine antigens ordered unable to be collected. She smoked and quit a month ago. Less than pack a day x 28 yrs  Pleuritic chest pain Elevated D-dimer: No PE on CTA, duplex 12/18 negative for DVT in legs  T2DM with uncontrolled hyperglycemia: A1c of 11.6 a month ago.  Started on lantus > increased to 20 units, 8 units Premeal and SSI.  Patient has high chances of noncompliance with diet and medication. Home glimepiride  and metformin  on hold. Diabetes coordinator consulted and patient has been educated on insulin  use-as she will need to go home on insulin  Recent Labs  Lab 06/15/24 1215 06/15/24 1633 06/15/24 2058 06/16/24 0603 06/16/24 1125  GLUCAP 134* 133* 180* 305* 103*    Hyponatremia: likely from hyperglycemia, monitor  Social issues: Patient might need more support at home. toc consulted.  Hypomagnesemia: Improved  Hypoalbuminemia: Augment diet RD following  Normocytic anemia: Some drop in hemoglobin holding stable -although on admission in 12. Checked anemia panel iron slightly low at 21 with normal ferritin B12 332 folate normal will put on B12 supplement for borderline B12  Substance abuse History of cocaine abuse Hx of crack cocaine use, + a month ago-  UDS positive for opiates Patient appears much older than her stated age.  She is residing at her father's house who is taking care of.  Mobility: PT Orders: Active PT Follow up Rec: Skilled Nursing-Short Term Rehab (<3 Hours/Day)06/14/2024 1400   DVT prophylaxis: enoxaparin  (LOVENOX ) injection 40 mg Start: 06/08/24 1100 SCDs Start: 06/08/24 1046 SCDs Start: 06/04/24 1922  Code Status:   Code Status: Full Code Family Communication: plan of care  discussed with patient at bedside.  Father was updated at the bedside multiple times, called to update   12/18 no answer Patient status is: Remains hospitalized because of severity of illness Level of care: Telemetry   Dispo: The patient is from: HOME            Anticipated disposition:Per TOC no SNF willing to accept her.Patient and her father planning to take her home with home health upon discharge  Objective: Vitals last 24 hrs: Vitals:   06/15/24 2336 06/16/24 0327 06/16/24 0750 06/16/24 0835  BP: 135/75 130/69 121/67   Pulse: 80 77 80 80  Resp: 19 18 16 18   Temp: (!) 97.4 F (36.3 C) 98.9 F (37.2 C) 98.6 F (37 C)   TempSrc: Oral Oral Oral   SpO2: 97% 99% 92% 92%  Weight:      Height:        Physical Examination: General exam: AAOX3, pleasant, looks older than her age, nad HEENT:Oral mucosa moist, Ear/Nose WNL grossly Respiratory system: CTA B/L.  No use of accessory muscles Cardiovascular system: S1 & S2 +, No JVD. Gastrointestinal system: Abd soft,NT,ND, BS+ Nervous System: Alert, awake, moving all extremities,and following commands. Extremities: extremities warm, left foot with dressing c/d/I Skin: Warm, no rashes MSK: Normal muscle bulk,tone, power   Medications reviewed:  Scheduled Meds:  acetaminophen   975 mg Oral TID   amoxicillin -clavulanate  1 tablet Oral Q12H   arformoterol   15 mcg Nebulization BID   budesonide  (PULMICORT ) nebulizer solution  0.25 mg Nebulization  BID   vitamin B-12  500 mcg Oral Daily   enoxaparin  (LOVENOX ) injection  40 mg Subcutaneous Daily   feeding supplement (GLUCERNA SHAKE)  237 mL Oral BID BM   furosemide   20 mg Intravenous BID   insulin  aspart  0-5 Units Subcutaneous QHS   insulin  aspart  0-9 Units Subcutaneous TID WC   insulin  aspart  8 Units Subcutaneous TID WC   insulin  glargine  20 Units Subcutaneous Daily   linezolid   600 mg Oral Q12H   magnesium  oxide  400 mg Oral BID   multivitamin with minerals  1 tablet Oral Daily    nitroGLYCERIN   0.2 mg Transdermal Daily   nutrition supplement (JUVEN)  1 packet Oral BID WC   pantoprazole   40 mg Oral Daily   revefenacin   175 mcg Nebulization Daily   Continuous Infusions:   Diet: Diet Order             Diet Carb Modified Fluid consistency: Thin; Room service appropriate? Yes  Diet effective now                    Data Reviewed: I have personally reviewed following labs and imaging studies ( see epic result tab) CBC: Recent Labs  Lab 06/10/24 0213 06/12/24 0149 06/13/24 0156 06/14/24 0217 06/15/24 0701  WBC 10.9* 8.4 11.2* 10.3 11.7*  HGB 8.8* 9.2* 9.8* 10.2* 10.5*  HCT 29.0* 31.4* 32.9* 34.3* 35.7*  MCV 87.9 88.7 87.0 86.8 87.9  PLT 253 337 407* 447* 460*   CMP: Recent Labs  Lab 06/12/24 0149 06/13/24 0156 06/14/24 0217 06/15/24 0701 06/16/24 0450  NA 138 138 135 136 133*  K 4.4 4.6 4.7 4.7 4.7  CL 93* 96* 94* 94* 93*  CO2 40* 32 34* 37* 31  GLUCOSE 201* 239* 372* 265* 314*  BUN 10 19 24* 22* 29*  CREATININE 0.61 0.91 0.57 0.50 0.62  CALCIUM  8.2* 8.8* 9.1 9.2 9.4   GFR: Estimated Creatinine Clearance: 82.7 mL/min (by C-G formula based on SCr of 0.62 mg/dL). No results for input(s): AST, ALT, ALKPHOS, BILITOT, PROT, ALBUMIN in the last 168 hours.  No results for input(s): LIPASE, AMYLASE in the last 168 hours. No results for input(s): AMMONIA in the last 168 hours. Coagulation Profile: No results for input(s): INR, PROTIME in the last 168 hours. Unresulted Labs (From admission, onward)     Start     Ordered   06/15/24 0500  Creatinine, serum  (enoxaparin  (LOVENOX )    CrCl >/= 30 ml/min)  Weekly,   R     Comments: while on enoxaparin  therapy   Question:  Specimen collection method  Answer:  Lab=Lab collect   06/08/24 1045           Antimicrobials/Microbiology: Anti-infectives (From admission, onward)    Start     Dose/Rate Route Frequency Ordered Stop   06/14/24 1500  amoxicillin -clavulanate  (AUGMENTIN ) 875-125 MG per tablet 1 tablet        1 tablet Oral Every 12 hours 06/14/24 1330     06/14/24 1430  linezolid  (ZYVOX ) tablet 600 mg        600 mg Oral Every 12 hours 06/14/24 1330     06/08/24 1400  azithromycin  (ZITHROMAX ) 500 mg in sodium chloride  0.9 % 250 mL IVPB        500 mg 250 mL/hr over 60 Minutes Intravenous Every 24 hours 06/08/24 1309 06/10/24 1328   06/05/24 1700  vancomycin  (VANCOCIN ) 1,750 mg in sodium chloride   0.9 % 500 mL IVPB  Status:  Discontinued        1,750 mg 258.8 mL/hr over 120 Minutes Intravenous Every 24 hours 06/04/24 1945 06/14/24 1330   06/04/24 2300  Ampicillin -Sulbactam (UNASYN ) 3 g in sodium chloride  0.9 % 100 mL IVPB  Status:  Discontinued        3 g 200 mL/hr over 30 Minutes Intravenous Every 6 hours 06/04/24 1945 06/14/24 1330   06/04/24 1630  Vancomycin  (VANCOCIN ) 1,250 mg in sodium chloride  0.9 % 250 mL IVPB        1,250 mg 166.7 mL/hr over 90 Minutes Intravenous  Once 06/04/24 1622 06/04/24 1924   06/04/24 1630  piperacillin -tazobactam (ZOSYN ) IVPB 3.375 g        3.375 g 100 mL/hr over 30 Minutes Intravenous  Once 06/04/24 1622 06/04/24 1720   06/04/24 1615  vancomycin  (VANCOCIN ) 2,000 mg in sodium chloride  0.9 % 500 mL IVPB  Status:  Discontinued        2,000 mg 260 mL/hr over 120 Minutes Intravenous  Once 06/04/24 1611 06/04/24 1622   06/04/24 1615  piperacillin -tazobactam (ZOSYN ) IVPB 4.5 g  Status:  Discontinued        4.5 g 200 mL/hr over 30 Minutes Intravenous  Once 06/04/24 1611 06/04/24 1622         Component Value Date/Time   SDES BLOOD RIGHT ANTECUBITAL 06/05/2024 0110   SPECREQUEST  06/05/2024 0110    BOTTLES DRAWN AEROBIC AND ANAEROBIC Blood Culture adequate volume   CULT  06/05/2024 0110    NO GROWTH 5 DAYS Performed at [facility], [address] [place], [place] [address]    REPTSTATUS 06/10/2024 FINAL 06/05/2024 0110    Procedures:    Mennie LAMY, MD [facility] 06/16/2024, 11:59 AM   "

## 2024-06-16 NOTE — Progress Notes (Signed)
 Patient wound dressing done. Pt tolerated it well

## 2024-06-17 DIAGNOSIS — J188 Other pneumonia, unspecified organism: Secondary | ICD-10-CM | POA: Diagnosis not present

## 2024-06-17 DIAGNOSIS — J9612 Chronic respiratory failure with hypercapnia: Secondary | ICD-10-CM | POA: Diagnosis not present

## 2024-06-17 DIAGNOSIS — M86172 Other acute osteomyelitis, left ankle and foot: Secondary | ICD-10-CM | POA: Diagnosis not present

## 2024-06-17 DIAGNOSIS — J9601 Acute respiratory failure with hypoxia: Secondary | ICD-10-CM | POA: Diagnosis not present

## 2024-06-17 DIAGNOSIS — E1165 Type 2 diabetes mellitus with hyperglycemia: Secondary | ICD-10-CM | POA: Diagnosis not present

## 2024-06-17 DIAGNOSIS — F1410 Cocaine abuse, uncomplicated: Secondary | ICD-10-CM | POA: Diagnosis not present

## 2024-06-17 DIAGNOSIS — J9622 Acute and chronic respiratory failure with hypercapnia: Secondary | ICD-10-CM | POA: Diagnosis not present

## 2024-06-17 DIAGNOSIS — J9621 Acute and chronic respiratory failure with hypoxia: Secondary | ICD-10-CM | POA: Diagnosis not present

## 2024-06-17 LAB — CBC
HCT: 36.3 % (ref 36.0–46.0)
Hemoglobin: 10.8 g/dL — ABNORMAL LOW (ref 12.0–15.0)
MCH: 25.8 pg — ABNORMAL LOW (ref 26.0–34.0)
MCHC: 29.8 g/dL — ABNORMAL LOW (ref 30.0–36.0)
MCV: 86.6 fL (ref 80.0–100.0)
Platelets: 420 K/uL — ABNORMAL HIGH (ref 150–400)
RBC: 4.19 MIL/uL (ref 3.87–5.11)
RDW: 14.7 % (ref 11.5–15.5)
WBC: 12.7 K/uL — ABNORMAL HIGH (ref 4.0–10.5)
nRBC: 0 % (ref 0.0–0.2)

## 2024-06-17 LAB — GLUCOSE, CAPILLARY
Glucose-Capillary: 356 mg/dL — ABNORMAL HIGH (ref 70–99)
Glucose-Capillary: 117 mg/dL — ABNORMAL HIGH (ref 70–99)
Glucose-Capillary: 187 mg/dL — ABNORMAL HIGH (ref 70–99)
Glucose-Capillary: 210 mg/dL — ABNORMAL HIGH (ref 70–99)

## 2024-06-17 LAB — BASIC METABOLIC PANEL WITH GFR
Anion gap: 8 (ref 5–15)
BUN: 30 mg/dL — ABNORMAL HIGH (ref 6–20)
CO2: 35 mmol/L — ABNORMAL HIGH (ref 22–32)
Calcium: 9.5 mg/dL (ref 8.9–10.3)
Chloride: 94 mmol/L — ABNORMAL LOW (ref 98–111)
Creatinine, Ser: 0.62 mg/dL (ref 0.44–1.00)
GFR, Estimated: >60 mL/min
Glucose, Bld: 131 mg/dL — ABNORMAL HIGH (ref 70–99)
Potassium: 4 mmol/L (ref 3.5–5.1)
Sodium: 137 mmol/L (ref 135–145)

## 2024-06-17 MED ORDER — LOPERAMIDE HCL 2 MG PO CAPS
2.0000 mg | ORAL_CAPSULE | ORAL | Status: AC | PRN
  Administered 2024-06-17 – 2024-06-18 (×2): 2 mg via ORAL
  Filled 2024-06-17 (×2): qty 1

## 2024-06-17 MED ORDER — MELATONIN 5 MG PO TABS
5.0000 mg | ORAL_TABLET | Freq: Every evening | ORAL | Status: AC | PRN
  Administered 2024-06-17 – 2024-06-19 (×3): 5 mg via ORAL
  Filled 2024-06-17 (×3): qty 1

## 2024-06-17 NOTE — Plan of Care (Signed)

## 2024-06-17 NOTE — Plan of Care (Signed)
" °  Problem: Tissue Perfusion: Goal: Adequacy of tissue perfusion will improve Outcome: Progressing   Problem: Clinical Measurements: Goal: Cardiovascular complication will be avoided Outcome: Progressing   Problem: Activity: Goal: Risk for activity intolerance will decrease Outcome: Progressing   Problem: Nutrition: Goal: Adequate nutrition will be maintained Outcome: Progressing   Problem: Coping: Goal: Level of anxiety will decrease Outcome: Progressing   "

## 2024-06-17 NOTE — Progress Notes (Signed)
 " PROGRESS NOTE    Bianca Yang  FMW:[id number] DOB: Aug 25, 1975 DOA: 06/04/2024 PCP: Patient, [name] Per    Brief Narrative:  Patient is a 48 year old female with PMHx of recent left fifth metatarsal amputation, uncontrolled T2DM, OSA not on CPAP, PAD, substance abuse disorder including cocaine and opiates who presented to the ED on 06/04/2024 complaining of left foot pain, erythema, and swelling.  Patient underwent left fifth metatarsal amputation for osteomyelitis with Dr. Harden on 05/05/2024 and was discharged on 05/09/2024 with doxycycline , with cultures later growing MRSA and Streptococcus constellatus, for which Augmentin  was called in, however patient reportedly did not take either antibiotic and did not attend postop follow-up visits.  In the ED, she was noted to be afebrile with mild leukocytosis.  ESR elevated 41, CRP 2.9.  X-ray of left foot showed large lateral midfoot ulceration with lytic destruction of the cuboid and proximal base of the fourth metatarsal suspicious for osteomyelitis.  CT scan of the foot showed acute osteomyelitis involving the fourth metatarsal and cuboid.  Was evaluated by Dr. Harden on admission who noted wound dehiscence and bone changes of the fourth metatarsal and cuboid, recommended nonweightbearing and IV antibiotics, noting bone marrow edema with no plans for surgical intervention at this time. She was started on Vancomycin  and Unasyn .  Hospital course was complicated by fevers, worsening leukocytosis, and acute on chronic hypoxic and hypercapnic respiratory failure with escalating O2 requirements up to 15 L HFNC, thought to be secondary to multifocal pneumonia in the setting of likely untreated OSA, for which she received 3 days of azithromycin  in addition to Vanc/Unasyn .  ID was consulted and she was ultimately transitioned to p.o. linezolid  and Augmentin  on 06/14/2024 for 6 weeks duration.  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 Harden, noted no surgical intervention, continue nonweightbearing, continue Vashe dressing changes at discharge - Received approximately 9 days of IV vancomycin  and Unasyn  then switched to p.o. linezolid  and Augmentin  on 06/14/2024 by ID for 6 weeks and outpatient follow-up with ID clinic  Acute on chronic hypoxic hypercapnic respiratory failure - improving - VBG 12/7 showed pH 7.36, pCO2 68.6, bicarb 39.1 - Had escalating O2 requirement, up to 15L HFNC, which has since been weaned down to [place] -ABG 12/11 showed pH 7.38, pO2 60, pCO2 68, bicarb 40.2 - PCCM evaluated patient on 12/11, indicated hypoxemia likely in part caused by untreated OSA and somnolence leading to hypoventilation - CTA (12/17) was negative for PE, showed mild patchy airspace consolidation in bilateral lower lobes concerning for pneumonia - Continue Brovana , Pulmicort , and Yupelri  nebs - Currently on 3L [place] - Continue to wean O2 as tolerated  Multifocal pneumonia - CXR 12/11 showed findings consistent with multifocal pneumonia - Completed 3 days of azithromycin  concomitantly with Unasyn  - CTA 12/17 with mild patchy airspace consolidation in bilateral lower lobes concerning for pneumonia - On prolonged course of Linezolid  and Augmentin  as above   New HFmrEF - TTE (12/11) showed mildly reduced LV systolic function (EF 45 to 50%) with abnormal septal motion/inferior basal hypokinesis, mildly enlarged RV with mildly reduced function, trivial MR, dilated IVC with >50% collapse consistent with RAP 8 mmHg - Appears euvolemic - Continue IV Lasix  20 mg daily - Outpatient follow up with cardiology  Uncontrolled T2DM - Hgb A1c 11.6 (05/04/2024) - Holding home metformin  and glimepiride  - Continue Lantus  20  units, Novolog  8 units TID premeal, SSI - DM educator following - Will be discharged on  insulin   Normocytic anemia - 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 inflammation  - Hgb relatively stable but down from recent baseline of 12-13  Ruled out PE and DVT - CTA 12/17 negative for PE - BLE duplex 12/8 negative for DVT   Enlarged thyroid gland with left nodule - CTA chest showed incidental finding of enlarged and heterogeneous thyroid gland with a left thyroid nodule measuring 3.6 x 2.4 cm increased in size - Recommended follow-up with nonemergent thyroid ultrasound  Polysubstance abuse - UDS positive for cocaine and opiates  - Counseled on need for cessation  History of tobacco abuse - Recently quit less than 1 month ago, previously smoking less than a pack/day for 28 years    DVT prophylaxis: enoxaparin  (LOVENOX ) injection 40 mg Start: 06/08/24 1100 SCDs Start: 06/08/24 1046 SCDs Start: 06/04/24 1922   Code Status:   Code Status: Full Code  Family Communication: None at bedside  Disposition Plan: Was denied from SNFs. Plan to go Home with HH once weaned off O2 with assistance of her father  PT - Follow Up Recommendations: Home health PT (with 24 hour care by father) - PT equipment: Wheelchair (measurements PT), Wheelchair cushion (measurements PT), BSC/3in1, Other (comment), Rolling walker (2 wheels) (tub bench) OT - Follow Up Recommendations: Skilled nursing-short term rehab (<3 hours/day) -    Level of care: Telemetry  Consultants:  Ortho, ID, PCCM  Procedures:  None  Antimicrobials: Vanc and Unaysn > PO Linezolid  and Augmentin    Subjective: Patient examined at bedside. Was asleep, woke up but covered head with blanket. Provided short answers.   Objective: Vitals:   06/16/24 2025 06/17/24 0416 06/17/24 0739 06/17/24 1450  BP:  120/67 128/65 120/70  Pulse: 81 78 68 84  Resp: 16 18 16 16   Temp:  98.7 F (37.1 C) 98 F (36.7 C)   TempSrc:  Oral Oral   SpO2: 94% 96% 94% 91%  Weight:      Height:        Intake/Output Summary (Last 24 hours) at 06/17/2024  1550 Last data filed at 06/17/2024 0400 Gross per 24 hour  Intake 1194 ml  Output --  Net 1194 ml   Filed Weights   06/04/24 1252  Weight: 67 kg    Examination:  Gen: NAD, A&Ox3 HEENT: NCAT, EOMI Neck: Supple, no JVD, no LAD CV: RRR, no murmurs Resp: normal WOB, CTAB, no w/r/r Abd: Soft, NTND, no guarding, BS normoactive Ext: No LE edema, pulses 2+ b/l Skin: Warm, dry, no rashes/lesions Neuro: CN II-XII grossly intact, strength 5/5 b/l, sensation intact Psych: Calm, cooperative, appropriate affect    Data Reviewed: I have personally reviewed following labs and imaging studies  CBC: Recent Labs  Lab 06/12/24 0149 06/13/24 0156 06/14/24 0217 06/15/24 0701 06/17/24 1204  WBC 8.4 11.2* 10.3 11.7* 12.7*  HGB 9.2* 9.8* 10.2* 10.5* 10.8*  HCT 31.4* 32.9* 34.3* 35.7* 36.3  MCV 88.7 87.0 86.8 87.9 86.6  PLT 337 407* 447* 460* 420*   Basic Metabolic Panel: Recent Labs  Lab 06/13/24 0156 06/14/24 0217 06/15/24 0701 06/16/24 0450 06/17/24 1204  NA 138 135 136 133* 137  K 4.6 4.7 4.7 4.7 4.0  CL 96* 94* 94* 93* 94*  CO2 32 34* 37* 31 35*  GLUCOSE 239* 372* 265* 314* 131*  BUN 19 24* 22* 29* 30*  CREATININE 0.91 0.57 0.50 0.62 0.62  CALCIUM  8.8* 9.1 9.2 9.4  9.5   GFR: Estimated Creatinine Clearance: 82.7 mL/min (by C-G formula based on SCr of 0.62 mg/dL). Liver Function Tests: No results for input(s): AST, ALT, ALKPHOS, BILITOT, PROT, ALBUMIN in the last 168 hours. No results for input(s): LIPASE, AMYLASE in the last 168 hours. No results for input(s): AMMONIA in the last 168 hours. Coagulation Profile: No results for input(s): INR, PROTIME in the last 168 hours. Cardiac Enzymes: No results for input(s): CKTOTAL, CKMB, CKMBINDEX, TROPONINI in the last 168 hours. BNP (last 3 results) No results for input(s): PROBNP in the last 8760 hours. HbA1C: No results for input(s): HGBA1C in the last 72 hours. CBG: Recent Labs  Lab  06/16/24 1125 06/16/24 1613 06/16/24 2129 06/17/24 0630 06/17/24 1151  GLUCAP 103* 162* 194* 356* 117*   Lipid Profile: No results for input(s): CHOL, HDL, LDLCALC, TRIG, CHOLHDL, LDLDIRECT in the last 72 hours. Thyroid Function Tests: No results for input(s): TSH, T4TOTAL, FREET4, T3FREE, THYROIDAB in the last 72 hours. Anemia Panel: No results for input(s): VITAMINB12, FOLATE, FERRITIN, TIBC, IRON, RETICCTPCT in the last 72 hours. Sepsis Labs: No results for input(s): PROCALCITON, LATICACIDVEN in the last 168 hours.  No results found for this or any previous visit (from the past 240 hours).   Radiology Studies: No results found.  Scheduled Meds:  acetaminophen   975 mg Oral TID   amoxicillin -clavulanate  1 tablet Oral Q12H   arformoterol   15 mcg Nebulization BID   budesonide  (PULMICORT ) nebulizer solution  0.25 mg Nebulization BID   vitamin B-12  500 mcg Oral Daily   enoxaparin  (LOVENOX ) injection  40 mg Subcutaneous Daily   feeding supplement (GLUCERNA SHAKE)  237 mL Oral BID BM   furosemide   20 mg Intravenous BID   insulin  aspart  0-5 Units Subcutaneous QHS   insulin  aspart  0-9 Units Subcutaneous TID WC   insulin  aspart  8 Units Subcutaneous TID WC   insulin  glargine  20 Units Subcutaneous Daily   linezolid   600 mg Oral Q12H   magnesium  oxide  400 mg Oral BID   multivitamin with minerals  1 tablet Oral Daily   nitroGLYCERIN   0.2 mg Transdermal Daily   nutrition supplement (JUVEN)  1 packet Oral BID WC   pantoprazole   40 mg Oral Daily   revefenacin   175 mcg Nebulization Daily   Continuous Infusions:   Unresulted Labs (From admission, onward)     Start     Ordered   06/18/24 0500  Basic metabolic panel with GFR  Tomorrow morning,   R       Question:  Specimen collection method  Answer:  Lab=Lab collect   06/17/24 1152   06/18/24 0500  CBC  Tomorrow morning,   R       Question:  Specimen collection method  Answer:  Lab=Lab  collect   06/17/24 1152   06/15/24 0500  Creatinine, serum  (enoxaparin  (LOVENOX )    CrCl >/= 30 ml/min)  Weekly,   R     Comments: while on enoxaparin  therapy   Question:  Specimen collection method  Answer:  Lab=Lab collect   06/08/24 1045             LOS:  LOS: 13 days   Time Spent: 55 minutes  Keevon Henney Al-Sultani, MD [facility]  If 7PM-7AM, please contact night-coverage  06/17/2024, 3:50 PM      "

## 2024-06-18 DIAGNOSIS — J188 Other pneumonia, unspecified organism: Secondary | ICD-10-CM | POA: Diagnosis not present

## 2024-06-18 DIAGNOSIS — M86172 Other acute osteomyelitis, left ankle and foot: Secondary | ICD-10-CM | POA: Diagnosis not present

## 2024-06-18 DIAGNOSIS — E1165 Type 2 diabetes mellitus with hyperglycemia: Secondary | ICD-10-CM

## 2024-06-18 DIAGNOSIS — F1410 Cocaine abuse, uncomplicated: Secondary | ICD-10-CM | POA: Diagnosis not present

## 2024-06-18 DIAGNOSIS — J9621 Acute and chronic respiratory failure with hypoxia: Secondary | ICD-10-CM

## 2024-06-18 DIAGNOSIS — J9622 Acute and chronic respiratory failure with hypercapnia: Secondary | ICD-10-CM

## 2024-06-18 LAB — CBC
HCT: 35.1 % — ABNORMAL LOW (ref 36.0–46.0)
Hemoglobin: 10.4 g/dL — ABNORMAL LOW (ref 12.0–15.0)
MCH: 25.9 pg — ABNORMAL LOW (ref 26.0–34.0)
MCHC: 29.6 g/dL — ABNORMAL LOW (ref 30.0–36.0)
MCV: 87.5 fL (ref 80.0–100.0)
Platelets: 387 K/uL (ref 150–400)
RBC: 4.01 MIL/uL (ref 3.87–5.11)
RDW: 14.6 % (ref 11.5–15.5)
WBC: 10.3 K/uL (ref 4.0–10.5)
nRBC: 0 % (ref 0.0–0.2)

## 2024-06-18 LAB — BASIC METABOLIC PANEL WITH GFR
Anion gap: 8 (ref 5–15)
BUN: 34 mg/dL — ABNORMAL HIGH (ref 6–20)
CO2: 31 mmol/L (ref 22–32)
Calcium: 9 mg/dL (ref 8.9–10.3)
Chloride: 95 mmol/L — ABNORMAL LOW (ref 98–111)
Creatinine, Ser: 0.63 mg/dL (ref 0.44–1.00)
GFR, Estimated: >60 mL/min
Glucose, Bld: 319 mg/dL — ABNORMAL HIGH (ref 70–99)
Potassium: 4.4 mmol/L (ref 3.5–5.1)
Sodium: 134 mmol/L — ABNORMAL LOW (ref 135–145)

## 2024-06-18 LAB — GLUCOSE, CAPILLARY
Glucose-Capillary: 158 mg/dL — ABNORMAL HIGH (ref 70–99)
Glucose-Capillary: 158 mg/dL — ABNORMAL HIGH (ref 70–99)
Glucose-Capillary: 195 mg/dL — ABNORMAL HIGH (ref 70–99)
Glucose-Capillary: 308 mg/dL — ABNORMAL HIGH (ref 70–99)
Glucose-Capillary: 65 mg/dL — ABNORMAL LOW (ref 70–99)

## 2024-06-18 MED ORDER — LOPERAMIDE HCL 2 MG PO CAPS
2.0000 mg | ORAL_CAPSULE | ORAL | Status: AC | PRN
  Administered 2024-06-18 – 2024-06-20 (×2): 2 mg via ORAL
  Filled 2024-06-18 (×2): qty 1

## 2024-06-18 NOTE — Progress Notes (Addendum)
 Occupational Therapy Treatment Patient Details Name: Bianca Yang MRN: [id number] DOB: 09-04-1975 Today's Date: 06/18/2024   History of present illness Pt is a 48 y.o. F presenting to [facility] on 06/04/24 with L foot pain and swelling following L foot 5th ray amputation on 05/05/24. Dehisence of wound. IMG done on 12/11 suggesting multifocal PNA. Stitches removed on 12/17. PMH is significant for DM, cocaine abuse.   OT comments  Pt progressing towards goals. Focus of session on progressing activity tolerance and increasing safe engagement in ADL tasks. Pt required set-up A for ADLs this session. Politely declined OOB mobility d/t fatigue but was able to independently reposition in bed. Educated Pt on energy conservation strategies for safe ADL engagement at home. Pt continues to benefit from acute OT services. Per chart review, Pt unable to receive SNF services. Pt will benefit from [facility] services at d/c with level of assist as outlined below. Continue per POC.       If plan is discharge home, recommend the following:  A little help with walking and/or transfers;A little help with bathing/dressing/bathroom;Assistance with cooking/housework;Assist for transportation;Help with stairs or ramp for entrance   Equipment Recommendations  Wheelchair (measurements OT);Wheelchair cushion (measurements OT);BSC/3in1    Recommendations for Other Services      Precautions / Restrictions Precautions Precautions: Fall Recall of Precautions/Restrictions: Intact Restrictions Weight Bearing Restrictions Per Provider Order: Yes LLE Weight Bearing Per Provider Order: Non weight bearing Other Position/Activity Restrictions: Pt requested to utilize rollator as she stated she has used one before and could WB on both legs. Had to reinforce education that she is unable to WB on LLE.       Mobility Bed Mobility Overal bed mobility: Modified Independent             General bed mobility comments: Pt  independently able to reposition and move in bed.    Transfers                   General transfer comment: Pt respectfully declined transfer OOB.     Balance Overall balance assessment: Needs assistance                                         ADL either performed or assessed with clinical judgement   ADL Overall ADL's : Needs assistance/impaired     Grooming: Set up Grooming Details (indicate cue type and reason): Grooming items placed within Pt reach bed level.                               General ADL Comments: Pt states she can independently complete all ADL tasks. Per chart review, Pt requires assistance.    Extremity/Trunk Assessment Upper Extremity Assessment Upper Extremity Assessment: Overall WFL for tasks assessed            Vision       Perception     Praxis     Communication Communication Communication: No apparent difficulties   Cognition Arousal: Alert Behavior During Therapy: WFL for tasks assessed/performed Cognition: No family/caregiver present to determine baseline, Cognition impaired     Awareness: Online awareness impaired Memory impairment (select all impairments): Short-term memory     OT - Cognition Comments: Pt with short-term memory deficits. Requested information that she had obtained from RN previously. Decreased safety awareness and insight into  deficits.                 Following commands: Intact        Cueing   Cueing Techniques: Verbal cues  Exercises      Shoulder Instructions       General Comments SpO2 84% upon OT entry to room. Educated Pt on PLB and upright seated position. SpO2 increased to >90% with instruction. Pt educated on energy conservation strategies for safe return home. Handout provided.    Pertinent Vitals/ Pain       Pain Assessment Pain Assessment: No/denies pain  Home Living                                          Prior  Functioning/Environment              Frequency  Min 2X/week        Progress Toward Goals  OT Goals(current goals can now be found in the care plan section)  Progress towards OT goals: Progressing toward goals     Plan      Co-evaluation                 AM-PAC OT 6 Clicks Daily Activity     Outcome Measure   Help from another person eating meals?: None Help from another person taking care of personal grooming?: A Little Help from another person toileting, which includes using toliet, bedpan, or urinal?: None Help from another person bathing (including washing, rinsing, drying)?: A Little Help from another person to put on and taking off regular upper body clothing?: A Little Help from another person to put on and taking off regular lower body clothing?: A Little 6 Click Score: 20    End of Session Equipment Utilized During Treatment: Oxygen  OT Visit Diagnosis: Unsteadiness on feet (R26.81);Other abnormalities of gait and mobility (R26.89);Other symptoms and signs involving cognitive function   Activity Tolerance Patient limited by fatigue   Patient Left in bed;with call bell/phone within reach   Nurse Communication Mobility status;Other (comment) (SpO2 status)        Time: 8471-8444 OT Time Calculation (min): 27 min  Charges: OT General Charges $OT Visit: 1 Visit OT Treatments $Self Care/Home Management : 23-37 mins  Bianca Yang, OTR/L.  [facility] Acute Rehabilitation  Office: [phone]   Bianca Yang 06/18/2024, 4:03 PM

## 2024-06-18 NOTE — Progress Notes (Signed)
 " PROGRESS NOTE    Bianca Yang  FMW:[id number] DOB: 1975-07-16 DOA: 06/04/2024 PCP: Patient, [name] Per    Brief Narrative:  Patient is a 48 year old female with PMHx of recent left fifth metatarsal amputation, uncontrolled T2DM, OSA not on CPAP, PAD, substance abuse disorder including cocaine and opiates who presented to the ED on 06/04/2024 complaining of left foot pain, erythema, and swelling.  Patient underwent left fifth metatarsal amputation for osteomyelitis with Dr. Harden on 05/05/2024 and was discharged on 05/09/2024 with doxycycline , with cultures later growing MRSA and Streptococcus constellatus, for which Augmentin  was called in, however patient reportedly did not take either antibiotic and did not attend postop follow-up visits.  In the ED, she was noted to be afebrile with mild leukocytosis.  ESR elevated 41, CRP 2.9.  X-ray of left foot showed large lateral midfoot ulceration with lytic destruction of the cuboid and proximal base of the fourth metatarsal suspicious for osteomyelitis.  CT scan of the foot showed acute osteomyelitis involving the fourth metatarsal and cuboid.  Was evaluated by Dr. Harden on admission who noted wound dehiscence and bone changes of the fourth metatarsal and cuboid, recommended nonweightbearing and IV antibiotics, noting bone marrow edema with no plans for surgical intervention at this time. She was started on Vancomycin  and Unasyn .  Hospital course was complicated by fevers, worsening leukocytosis, and acute on chronic hypoxic and hypercapnic respiratory failure with escalating O2 requirements up to 15 L HFNC, thought to be secondary to multifocal pneumonia in the setting of likely untreated OSA, for which she received 3 days of azithromycin  in addition to Vanc/Unasyn .  ID was consulted and she was ultimately transitioned to p.o. linezolid  and Augmentin  on 06/14/2024 for 6 weeks duration.  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 Harden, noted no surgical intervention, continue nonweightbearing, continue Vashe dressing changes at discharge - Received approximately 9 days of IV vancomycin  and Unasyn  then switched to p.o. linezolid  and Augmentin  on 06/14/2024 by ID for 6 weeks and outpatient follow-up with ID clinic  Acute on chronic hypoxic hypercapnic respiratory failure - improving - VBG 12/7 showed pH 7.36, pCO2 68.6, bicarb 39.1 - Had escalating O2 requirement, up to 15L HFNC, which has since been weaned down to [place] -ABG 12/11 showed pH 7.38, pO2 60, pCO2 68, bicarb 40.2 - PCCM evaluated patient on 12/11, indicated hypoxemia likely in part caused by untreated OSA and somnolence leading to hypoventilation - CTA (12/17) was negative for PE, showed mild patchy airspace consolidation in bilateral lower lobes concerning for pneumonia - Continue Brovana , Pulmicort , and Yupelri  nebs - Weaned down to 2L [place] today - Continue to wean O2 as tolerated. 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 - TTE (12/11) showed mildly reduced LV systolic function (EF 45 to 50%) with abnormal septal motion/inferior basal hypokinesis, mildly enlarged RV with mildly reduced function, trivial MR, dilated IVC with >50% collapse consistent with RAP 8 mmHg - Appears euvolemic - Continue IV Lasix  20 mg daily - Outpatient follow  up with cardiology  Uncontrolled T2DM - Hgb A1c 11.6 (05/04/2024) - Holding home metformin  and  glimepiride  - Continue Lantus  20 units, Novolog  8 units TID premeal, SSI - DM educator following - Will be discharged on insulin   Normocytic anemia - 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 inflammation  - Hgb relatively stable but down from recent baseline of 12-13  Ruled out PE and DVT - CTA 12/17 negative for PE - BLE duplex 12/8 negative for DVT   Enlarged thyroid gland with left nodule - CTA chest showed incidental finding of enlarged and heterogeneous thyroid gland with a left thyroid nodule measuring 3.6 x 2.4 cm increased in size - Recommended follow-up with nonemergent thyroid ultrasound  Polysubstance abuse - UDS positive for cocaine and opiates  - Counseled on need for cessation  History of tobacco abuse - Recently quit less than 1 month ago, previously smoking less than a pack/day for 28 years   DVT prophylaxis: enoxaparin  (LOVENOX ) injection 40 mg Start: 06/08/24 1100 SCDs Start: 06/08/24 1046 SCDs Start: 06/04/24 1922   Code Status:   Code Status: Full Code  Family Communication: Discussed with patient's sister and patient's daughter at bedside  Disposition Plan: Was denied from SNFs. Plan to go Home with [facility] with assistance of her father if O2 remains stable at 2-3L [place] today  PT - Follow Up Recommendations: Home health PT (with 24 hour care by father) - PT equipment: Wheelchair (measurements PT), Wheelchair cushion (measurements PT), BSC/3in1, Other (comment), Rolling walker (2 wheels) (tub bench) OT - Follow Up Recommendations: Skilled nursing-short term rehab (<3 hours/day) -    Level of care: Telemetry  Consultants:  Ortho, ID, PCCM  Procedures:  None  Antimicrobials: Vanc and Unaysn > PO Linezolid  and Augmentin    Subjective: Patient examined at bedside. More awake and alert today. Reports feeling better, happy that she is down to 3L [place]. Wants to take her health seriously and turn things around.   Objective: Vitals:   06/17/24 2155 06/17/24 2157 06/18/24 0540 06/18/24  0728  BP:   125/66 126/65  Pulse:   79 83  Resp:   18 16  Temp:   98.7 F (37.1 C) 98.6 F (37 C)  TempSrc:   Oral   SpO2: 93% 93% 96% 93%  Weight:      Height:        Intake/Output Summary (Last 24 hours) at 06/18/2024 0844 Last data filed at 06/18/2024 0300 Gross per 24 hour  Intake 960 ml  Output --  Net 960 ml   Filed Weights   06/04/24 1252  Weight: 67 kg    Examination:  Gen: NAD, A&Ox3, appears significantly older than stated age HEENT: NCAT, EOMI Neck: Supple, no JVD, no LAD CV: RRR, no murmurs Resp: normal WOB, faint crackles at left lung base Abd: Soft, NTND, no guarding Ext: No LE edema, pulses 2+ b/l, left foot with dressing c/d/I and intact neurovascularly Skin: Warm, dry, Neuro: No focal deficits Psych: Calm, cooperative, appropriate affect  Data Reviewed: I have personally reviewed following labs and imaging studies  CBC: Recent Labs  Lab 06/13/24 0156 06/14/24 0217 06/15/24 0701 06/17/24 1204 06/18/24 0556  WBC 11.2* 10.3 11.7* 12.7* 10.3  HGB 9.8* 10.2* 10.5* 10.8* 10.4*  HCT 32.9* 34.3* 35.7* 36.3 35.1*  MCV 87.0 86.8 87.9 86.6 87.5  PLT 407* 447* 460* 420* 387   Basic Metabolic Panel: Recent Labs  Lab 06/14/24 0217 06/15/24 0701 06/16/24 0450 06/17/24 1204 06/18/24 0556  NA 135 136 133* 137  134*  K 4.7 4.7 4.7 4.0 4.4  CL 94* 94* 93* 94* 95*  CO2 34* 37* 31 35* 31  GLUCOSE 372* 265* 314* 131* 319*  BUN 24* 22* 29* 30* 34*  CREATININE 0.57 0.50 0.62 0.62 0.63  CALCIUM  9.1 9.2 9.4 9.5 9.0   GFR: Estimated Creatinine Clearance: 82.7 mL/min (by C-G formula based on SCr of 0.63 mg/dL). Liver Function Tests: No results for input(s): AST, ALT, ALKPHOS, BILITOT, PROT, ALBUMIN in the last 168 hours. No results for input(s): LIPASE, AMYLASE in the last 168 hours. No results for input(s): AMMONIA in the last 168 hours. Coagulation Profile: No results for input(s): INR, PROTIME in the last 168 hours. Cardiac  Enzymes: No results for input(s): CKTOTAL, CKMB, CKMBINDEX, TROPONINI in the last 168 hours. BNP (last 3 results) No results for input(s): PROBNP in the last 8760 hours. HbA1C: No results for input(s): HGBA1C in the last 72 hours. CBG: Recent Labs  Lab 06/17/24 0630 06/17/24 1151 06/17/24 1630 06/17/24 2106 06/18/24 0543  GLUCAP 356* 117* 187* 210* 308*   Lipid Profile: No results for input(s): CHOL, HDL, LDLCALC, TRIG, CHOLHDL, LDLDIRECT in the last 72 hours. Thyroid Function Tests: No results for input(s): TSH, T4TOTAL, FREET4, T3FREE, THYROIDAB in the last 72 hours. Anemia Panel: No results for input(s): VITAMINB12, FOLATE, FERRITIN, TIBC, IRON, RETICCTPCT in the last 72 hours. Sepsis Labs: No results for input(s): PROCALCITON, LATICACIDVEN in the last 168 hours.  No results found for this or any previous visit (from the past 240 hours).   Radiology Studies: No results found.  Scheduled Meds:  acetaminophen   975 mg Oral TID   amoxicillin -clavulanate  1 tablet Oral Q12H   arformoterol   15 mcg Nebulization BID   budesonide  (PULMICORT ) nebulizer solution  0.25 mg Nebulization BID   vitamin B-12  500 mcg Oral Daily   enoxaparin  (LOVENOX ) injection  40 mg Subcutaneous Daily   feeding supplement (GLUCERNA SHAKE)  237 mL Oral BID BM   furosemide   20 mg Intravenous BID   insulin  aspart  0-5 Units Subcutaneous QHS   insulin  aspart  0-9 Units Subcutaneous TID WC   insulin  aspart  8 Units Subcutaneous TID WC   insulin  glargine  20 Units Subcutaneous Daily   linezolid   600 mg Oral Q12H   magnesium  oxide  400 mg Oral BID   multivitamin with minerals  1 tablet Oral Daily   nitroGLYCERIN   0.2 mg Transdermal Daily   nutrition supplement (JUVEN)  1 packet Oral BID WC   pantoprazole   40 mg Oral Daily   revefenacin   175 mcg Nebulization Daily   Continuous Infusions:   Unresulted Labs (From admission, onward)     Start      Ordered   06/15/24 0500  Creatinine, serum  (enoxaparin  (LOVENOX )    CrCl >/= 30 ml/min)  Weekly,   R     Comments: while on enoxaparin  therapy   Question:  Specimen collection method  Answer:  Lab=Lab collect   06/08/24 1045             LOS:  LOS: 14 days   Time Spent: 45 minutes  Heer Justiss Al-Sultani, MD [facility]  If 7PM-7AM, please contact night-coverage  06/18/2024, 8:44 AM      "

## 2024-06-18 NOTE — Plan of Care (Signed)

## 2024-06-18 NOTE — Progress Notes (Signed)
 Mobility Specialist: Progress Note   06/18/24 1517  Mobility  Activity Ambulated with assistance  Level of Assistance Standby assist, set-up cues, supervision of patient - no hands on  Assistive Device Front wheel walker  Distance Ambulated (ft) 60 ft  LLE Weight Bearing Per Provider Order NWB  Activity Response Tolerated well  Mobility Referral Yes  Mobility visit 1 Mobility  Mobility Specialist Start Time (ACUTE ONLY) 1026  Mobility Specialist Stop Time (ACUTE ONLY) 1047  Mobility Specialist Time Calculation (min) (ACUTE ONLY) 21 min    Pt received in bed, pleasant and agreeable to mobility session. SV throughout. Distance limited d/t fatigue and RLE itchiness. SpO2 desat to 83% on 2LO2. Returned to room and SpO2 maintained WFL with seated break and cues for PLB. Left in bed with all needs met, call bell in reach.  Bianca Yang Mobility Specialist Please contact via SecureChat or Rehab office at [phone]

## 2024-06-19 ENCOUNTER — Inpatient Hospital Stay (HOSPITAL_COMMUNITY)

## 2024-06-19 ENCOUNTER — Ambulatory Visit: Admitting: Nurse Practitioner

## 2024-06-19 DIAGNOSIS — M86172 Other acute osteomyelitis, left ankle and foot: Secondary | ICD-10-CM | POA: Diagnosis not present

## 2024-06-19 DIAGNOSIS — J9621 Acute and chronic respiratory failure with hypoxia: Secondary | ICD-10-CM | POA: Diagnosis not present

## 2024-06-19 DIAGNOSIS — F1410 Cocaine abuse, uncomplicated: Secondary | ICD-10-CM | POA: Diagnosis not present

## 2024-06-19 DIAGNOSIS — J188 Other pneumonia, unspecified organism: Secondary | ICD-10-CM | POA: Diagnosis not present

## 2024-06-19 LAB — BASIC METABOLIC PANEL WITH GFR
Anion gap: 8 (ref 5–15)
BUN: 42 mg/dL — ABNORMAL HIGH (ref 6–20)
CO2: 36 mmol/L — ABNORMAL HIGH (ref 22–32)
Calcium: 9.2 mg/dL (ref 8.9–10.3)
Chloride: 94 mmol/L — ABNORMAL LOW (ref 98–111)
Creatinine, Ser: 0.68 mg/dL (ref 0.44–1.00)
GFR, Estimated: >60 mL/min
Glucose, Bld: 222 mg/dL — ABNORMAL HIGH (ref 70–99)
Potassium: 4.7 mmol/L (ref 3.5–5.1)
Sodium: 139 mmol/L (ref 135–145)

## 2024-06-19 LAB — GLUCOSE, CAPILLARY
Glucose-Capillary: 273 mg/dL — ABNORMAL HIGH (ref 70–99)
Glucose-Capillary: 169 mg/dL — ABNORMAL HIGH (ref 70–99)
Glucose-Capillary: 72 mg/dL (ref 70–99)
Glucose-Capillary: 196 mg/dL — ABNORMAL HIGH (ref 70–99)

## 2024-06-19 LAB — CBC
HCT: 33.5 % — ABNORMAL LOW (ref 36.0–46.0)
Hemoglobin: 9.8 g/dL — ABNORMAL LOW (ref 12.0–15.0)
MCH: 25.9 pg — ABNORMAL LOW (ref 26.0–34.0)
MCHC: 29.3 g/dL — ABNORMAL LOW (ref 30.0–36.0)
MCV: 88.4 fL (ref 80.0–100.0)
Platelets: 354 K/uL (ref 150–400)
RBC: 3.79 MIL/uL — ABNORMAL LOW (ref 3.87–5.11)
RDW: 14.5 % (ref 11.5–15.5)
WBC: 9.1 K/uL (ref 4.0–10.5)
nRBC: 0 % (ref 0.0–0.2)

## 2024-06-19 NOTE — Progress Notes (Signed)
 Physical Therapy Treatment Patient Details Name: Bianca Yang MRN: [id number] DOB: 12-21-1975 Today's Date: 06/19/2024   History of Present Illness Pt is a 48 y.o. F presenting to [facility] on 06/04/24 with L foot pain and swelling following L foot 5th ray amputation on 05/05/24. Dehisence of wound. IMG done on 12/11 suggesting multifocal PNA. Stitches removed on 12/17. PMH is significant for DM, cocaine abuse.    PT Comments  Pt tolerates treatment well but is impulsive during stair training. Pt does not have rails at home to utilize, so PT initiates training with hopping up steps backward with support of RW. Pt manages one step well with this method but reports feeling uncomfortable and then decides to attempt to ascend forward against PT recommendations. Pt is unable to hop up one step forward and then abandons walker to utilize railings, which she will not be able to utilize at home. After negotiating 3 steps with railings the pt reports fatigue and declines further stair training at this time. Pt will benefit from further stair training prior to discharge in an effort to improve safety and to provide an effective method to also maintain NWB through LLE. HHPT remains recommended.   If plan is discharge home, recommend the following: A lot of help with walking and/or transfers;A lot of help with bathing/dressing/bathroom;Assistance with cooking/housework;Direct supervision/assist for medications management;Direct supervision/assist for financial management;Assist for transportation;Help with stairs or ramp for entrance   Can travel by private vehicle        Equipment Recommendations  Wheelchair (measurements PT);Wheelchair cushion (measurements PT);BSC/3in1;Other (comment);Rolling walker (2 wheels) (tub bench)    Recommendations for Other Services       Precautions / Restrictions Precautions Precautions: Fall Recall of Precautions/Restrictions: Impaired Precaution/Restrictions Comments:  impulsive Restrictions Weight Bearing Restrictions Per Provider Order: Yes LLE Weight Bearing Per Provider Order: Non weight bearing     Mobility  Bed Mobility Overal bed mobility: Modified Independent                  Transfers Overall transfer level: Modified independent Equipment used: Rolling walker (2 wheels)                    Ambulation/Gait Ambulation/Gait assistance: Supervision Gait Distance (Feet): 50 Feet Assistive device: Rolling walker (2 wheels) Gait Pattern/deviations:  (hop-to gait) Gait velocity: reduced Gait velocity interpretation: <1.8 ft/sec, indicate of risk for recurrent falls   General Gait Details: slowed hop-to gait, good maintenance of WB restrictions with PT supervision   Stairs Stairs: Yes Stairs assistance: Min assist Stair Management: Backwards (hop-to) Number of Stairs: 3 General stair comments: pt initiall hops up one step backward with support of RW. Pt reports this method is uncomfortable for her and prefers to attempt hopping up forward with RW. Pt attempts to trouble shoot walker positioning for forward hop but then abandons this method and hops up 3 steps with support of bilateral railings despite PT encouragement to not use railings as she has none available at home. PT will follow up for further stair training   Wheelchair Mobility     Tilt Bed    Modified Rankin (Stroke Patients Only)       Balance Overall balance assessment: Needs assistance Sitting-balance support: No upper extremity supported, Feet supported Sitting balance-Leahy Scale: Good     Standing balance support: Single extremity supported, Reliant on assistive device for balance Standing balance-Leahy Scale: Poor  Communication Communication Communication: No apparent difficulties  Cognition Arousal: Alert Behavior During Therapy: Impulsive   PT - Cognitive impairments: Awareness, Safety/Judgement                        PT - Cognition Comments: pt is able to maintain WB precautions when supervised during session but does apply pressure through L foot when standing to pull up pants when PT initially not present. Also reports applying pressure through L foot last night Following commands: Intact      Cueing Cueing Techniques: Verbal cues  Exercises      General Comments General comments (skin integrity, edema, etc.): pt on 5L [place] upon PT arrival with sats in low 90s. PT attmepts to wean pt to room air as pt is on no O2 at baseline and pt desats to 57%. PT returns ot to 3L [employer] with quick increase in sats to 87%, and then returns to 90% on 4L [place]. It does appear that with encouragement for deep or pursed lip breathing that the pt is able to increase sats to mid-90s.      Pertinent Vitals/Pain Pain Assessment Pain Assessment: Faces Faces Pain Scale: Hurts little more Pain Location: L foot medial arch Pain Descriptors / Indicators: Sore Pain Intervention(s): Monitored during session    Home Living                          Prior Function            PT Goals (current goals can now be found in the care plan section) Acute Rehab PT Goals Patient Stated Goal: to get better Progress towards PT goals: Progressing toward goals    Frequency    Min 2X/week      PT Plan      Co-evaluation              AM-PAC PT 6 Clicks Mobility   Outcome Measure  Help needed turning from your back to your side while in a flat bed without using bedrails?: None Help needed moving from lying on your back to sitting on the side of a flat bed without using bedrails?: None Help needed moving to and from a bed to a chair (including a wheelchair)?: None Help needed standing up from a chair using your arms (e.g., wheelchair or bedside chair)?: None Help needed to walk in hospital room?: A Little Help needed climbing 3-5 steps with a railing? : A Lot 6 Click Score: 21     End of Session Equipment Utilized During Treatment: Gait belt;Oxygen Activity Tolerance: Patient tolerated treatment well Patient left: in bed;with call bell/phone within reach;with family/visitor present Nurse Communication: Mobility status;Weight bearing status PT Visit Diagnosis: Unsteadiness on feet (R26.81);Muscle weakness (generalized) (M62.81);Pain Pain - Right/Left: Left Pain - part of body: Ankle and joints of foot     Time: 1032-1101 PT Time Calculation (min) (ACUTE ONLY): 29 min  Charges:    $Gait Training: 23-37 mins PT General Charges $$ ACUTE PT VISIT: 1 Visit                     Bernardino JINNY Ruth, PT, DPT Acute Rehabilitation Office [phone]    Bernardino JINNY Ruth 06/19/2024, 11:13 AM

## 2024-06-19 NOTE — Inpatient Diabetes Management (Signed)
 Inpatient Diabetes Program Recommendations  AACE/ADA: New Consensus Statement on Inpatient Glycemic Control   Target Ranges:  Prepandial:   less than 140 mg/dL      Peak postprandial:   less than 180 mg/dL (1-2 hours)      Critically ill patients:  140 - 180 mg/dL   Lab Results  Component Value Date   GLUCAP 273 (H) 06/19/2024   HGBA1C 11.6 (H) 05/04/2024    Latest Reference Range & Units 06/18/24 05:43 06/18/24 11:55 06/18/24 15:39 06/18/24 20:45 06/18/24 23:55 06/19/24 06:26  Glucose-Capillary 70 - 99 mg/dL 691 (H) 841 (H) 841 (H) 65 (L) 195 (H) 273 (H)   Review of Glycemic Control   Diabetes history: DM2  Outpatient Diabetes medications:  Amaryl  2 mg daily Metformin  1 gm bid  Current orders for Inpatient glycemic control: Lantus  12 units daily Lantus  20 units daily  Novolog  0-9 units TID + 0-5 units at bedtime  Novolog  0-15 units TID    Inpatient Diabetes Program Recommendations:   Noted CBG has been 356, 308, and 273mg /dl over the past 3 mornings.    Please consider increasing Lantus  to 25 units daily     Thanks,  Lavanda Search, RN, MSN, [facility]  Inpatient Diabetes Coordinator  Pager [phone] (8a-5p)

## 2024-06-19 NOTE — Progress Notes (Signed)
 Occupational Therapy Treatment Patient Details Name: Bianca Yang MRN: [id number] DOB: 1975-08-02 Today's Date: 06/19/2024   History of present illness Pt is a 48 y.o. F presenting to [facility] on 06/04/24 with L foot pain and swelling following L foot 5th ray amputation on 05/05/24. Dehisence of wound. IMG done on 12/11 suggesting multifocal PNA. Stitches removed on 12/17. PMH is significant for DM, cocaine abuse.   OT comments  Pt progressing towards OT goals. Focus of session on progressing functional mobility and increasing independence with ADL tasks. Pt Mod I functional transfer to recliner while maintaining NWB LLE, Mod verbal cues for impulsivity during task. Pt completed dressing ADLs with up to Min A. Pt continues to benefit from acute skilled OT services. Continue per POC.       If plan is discharge home, recommend the following:  A little help with walking and/or transfers;A little help with bathing/dressing/bathroom;Assistance with cooking/housework;Assist for transportation;Help with stairs or ramp for entrance   Equipment Recommendations  Wheelchair (measurements OT);Wheelchair cushion (measurements OT);BSC/3in1    Recommendations for Other Services      Precautions / Restrictions Precautions Precautions: Fall Recall of Precautions/Restrictions: Impaired Precaution/Restrictions Comments: impulsive Restrictions Weight Bearing Restrictions Per Provider Order: Yes LLE Weight Bearing Per Provider Order: Non weight bearing       Mobility Bed Mobility Overal bed mobility: Modified Independent                  Transfers Overall transfer level: Modified independent Equipment used: Rolling walker (2 wheels)               General transfer comment: Mod I with RW transfer from bed to recliner. Maintained NWB precautions     Balance Overall balance assessment: Needs assistance Sitting-balance support: No upper extremity supported, Feet supported Sitting  balance-Leahy Scale: Good     Standing balance support: Bilateral upper extremity supported, During functional activity, Reliant on assistive device for balance Standing balance-Leahy Scale: Poor Standing balance comment: reliant on RW for support, NWB to LLE                           ADL either performed or assessed with clinical judgement   ADL Overall ADL's : Needs assistance/impaired     Grooming: Set up;Sitting           Upper Body Dressing : Minimal assistance;Sitting   Lower Body Dressing: Set up;Sitting/lateral leans                      Extremity/Trunk Assessment Upper Extremity Assessment Upper Extremity Assessment: Overall WFL for tasks assessed            Vision       Perception     Praxis     Communication Communication Communication: No apparent difficulties   Cognition Arousal: Alert Behavior During Therapy: Impulsive Cognition: No family/caregiver present to determine baseline, Cognition impaired     Awareness: Online awareness impaired Memory impairment (select all impairments): Short-term memory Attention impairment (select first level of impairment): Sustained attention   OT - Cognition Comments: Impulsive throuhout session                 Following commands: Intact        Cueing   Cueing Techniques: Verbal cues  Exercises      Shoulder Instructions       General Comments O2 sat decreased to 88% with bed mobility and  returned to 90% on 4.5L with instruction of PLB. `    Pertinent Vitals/ Pain       Pain Assessment Pain Assessment: Faces Faces Pain Scale: Hurts a little bit Pain Location: L foot Pain Descriptors / Indicators: Sore Pain Intervention(s): Limited activity within patient's tolerance, Monitored during session  Home Living                                          Prior Functioning/Environment              Frequency  Min 2X/week        Progress Toward  Goals  OT Goals(current goals can now be found in the care plan section)  Progress towards OT goals: Progressing toward goals     Plan      Co-evaluation                 AM-PAC OT 6 Clicks Daily Activity     Outcome Measure   Help from another person eating meals?: None Help from another person taking care of personal grooming?: A Little Help from another person toileting, which includes using toliet, bedpan, or urinal?: None Help from another person bathing (including washing, rinsing, drying)?: A Little Help from another person to put on and taking off regular upper body clothing?: A Little Help from another person to put on and taking off regular lower body clothing?: A Little 6 Click Score: 20    End of Session Equipment Utilized During Treatment: Oxygen;Rolling walker (2 wheels)  OT Visit Diagnosis: Unsteadiness on feet (R26.81);Other abnormalities of gait and mobility (R26.89);Other symptoms and signs involving cognitive function   Activity Tolerance Patient tolerated treatment well   Patient Left in chair;with call bell/phone within reach;with chair alarm set   Nurse Communication Mobility status        Time: 8548-8492 OT Time Calculation (min): 16 min  Charges: OT General Charges $OT Visit: 1 Visit OT Treatments $Self Care/Home Management : 8-22 mins  Maurilio CROME, OTR/L.  [facility]  Office: [phone]   Maurilio PARAS Graceanne Guin 06/19/2024, 3:44 PM

## 2024-06-19 NOTE — Progress Notes (Signed)
 " PROGRESS NOTE    Bianca Yang  FMW:[id number] DOB: 1976-02-08 DOA: 06/04/2024 PCP: Patient, [name] Per    Brief Narrative:  Patient is a 48 year old female with PMHx of recent left fifth metatarsal amputation, uncontrolled T2DM, OSA not on CPAP, PAD, substance abuse disorder including cocaine and opiates who presented to the ED on 06/04/2024 complaining of left foot pain, erythema, and swelling.  Patient underwent left fifth metatarsal amputation for osteomyelitis with Dr. Harden on 05/05/2024 and was discharged on 05/09/2024 with doxycycline , with cultures later growing MRSA and Streptococcus constellatus, for which Augmentin  was called in, however patient reportedly did not take either antibiotic and did not attend postop follow-up visits.  In the ED, she was noted to be afebrile with mild leukocytosis.  ESR elevated 41, CRP 2.9.  X-ray of left foot showed large lateral midfoot ulceration with lytic destruction of the cuboid and proximal base of the fourth metatarsal suspicious for osteomyelitis.  CT scan of the foot showed acute osteomyelitis involving the fourth metatarsal and cuboid.  Was evaluated by Dr. Harden on admission who noted wound dehiscence and bone changes of the fourth metatarsal and cuboid, recommended nonweightbearing and IV antibiotics, noting bone marrow edema with no plans for surgical intervention at this time. She was started on Vancomycin  and Unasyn .  Hospital course was complicated by fevers, worsening leukocytosis, and acute on chronic hypoxic and hypercapnic respiratory failure with escalating O2 requirements up to 15 L HFNC, thought to be secondary to multifocal pneumonia in the setting of likely untreated OSA, for which she received 3 days of azithromycin  in addition to Vanc/Unasyn .  ID was consulted and she was ultimately transitioned to p.o. linezolid  and Augmentin  on 06/14/2024 for 6 weeks duration.  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 Harden, noted no surgical intervention, continue nonweightbearing, continue Vashe dressing changes at discharge - Received approximately 9 days of IV vancomycin  and Unasyn  then switched to p.o. linezolid  and Augmentin  on 06/14/2024 by ID for 6 weeks and outpatient follow-up with ID clinic - Patient reported bearing weight on left foot while in the bathroom, feeling a pop and having some medial discomfort, requesting imaging - Left foot xray (12/22) showed progressive cortical erosive/destructive changes of the 4th metatarsal base and lateral cuboid consistent with osteomyelitis, with decreased soft tissue gas and no new bony destruction status post 5th ray resection  Acute on chronic hypoxic hypercapnic respiratory failure - improving - VBG 12/7 showed pH 7.36, pCO2 68.6, bicarb 39.1 - Had escalating O2 requirement, up to 15L HFNC, which has since been weaned down to [place] -ABG 12/11 showed pH 7.38, pO2 60, pCO2 68, bicarb 40.2 - PCCM evaluated patient on 12/11, indicated hypoxemia likely in part caused by untreated OSA and somnolence leading to hypoventilation - CTA (12/17) was negative for PE, showed mild patchy airspace consolidation in bilateral lower lobes concerning for pneumonia - Continue Brovana , Pulmicort , and Yupelri  nebs - Required escalation to 4.5 L/min today. Desatted to 56% while ambulating on RA with PT, improved to 88-90% on 4 L/min while resting/seated.  - Continue to wean O2 as tolerated. 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 - TTE (12/11) showed mildly reduced LV systolic function (EF 45 to 50%) with abnormal septal motion/inferior basal hypokinesis, mildly enlarged RV with mildly reduced function, trivial MR, dilated IVC with >50% collapse consistent with RAP 8 mmHg - Appears euvolemic - Continue IV Lasix  20 mg daily - Outpatient follow up with cardiology  Uncontrolled T2DM - Hgb A1c 11.6 (05/04/2024) - Holding home metformin  and glimepiride  - Continue Lantus  20 units, Novolog  8 units TID premeal, SSI - DM educator following - Will be discharged on insulin   Normocytic anemia - 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 inflammation  - Hgb relatively stable but down from recent baseline of 12-13  Ruled out PE and DVT - CTA 12/17 negative for PE - BLE duplex 12/8 negative for DVT   Enlarged thyroid gland with left nodule - CTA chest showed incidental finding of enlarged and heterogeneous thyroid gland with a left thyroid nodule measuring 3.6 x 2.4 cm increased in size - Recommended follow-up with nonemergent thyroid ultrasound  Polysubstance abuse - UDS positive for cocaine and opiates  - Counseled on need for cessation  History of tobacco abuse - Recently quit less than 1 month ago, previously smoking less than a pack/day for 28 years   DVT prophylaxis: enoxaparin  (LOVENOX ) injection 40 mg Start: 06/08/24 1100 SCDs Start: 06/08/24 1046 SCDs Start: 06/04/24 1922   Code Status:   Code Status: Full Code  Family Communication: Discussed with patient's sister and patient's daughter at bedside  Disposition Plan: Was denied from SNFs. Plan to [facility] with [facility] with assistance of her father if O2 remains stable at 2-3L [place] today  PT - Follow Up Recommendations: Home health PT (with assist of spouse, needs continued stair training) - PT equipment: Wheelchair (measurements PT), Wheelchair cushion (measurements PT), BSC/3in1, Other (comment), Rolling  walker (2 wheels) (tub bench) OT - Follow Up Recommendations: Home health OT -    Level of care: Telemetry  Consultants:  Ortho, ID, PCCM  Procedures:  None  Antimicrobials: Vanc and Unaysn > PO Linezolid  and Augmentin    Subjective: Patient examined in PT exercise room. Was working with PT. Ambulated from her room to PT exercise room on RA and desatted to 56%. Improved to 88-90% on 4L/min [place] at rest. Reported putting weight on her left foot while in the bathroom and feeling a pop with associated medial discomfort.   Objective: Vitals:   06/19/24 0854 06/19/24 1455 06/19/24 2024 06/19/24 2030  BP:  124/71 131/66   Pulse:  85 80   Resp:  18 19   Temp:  98.2 F (36.8 C) 98.2 F (36.8 C)   TempSrc:      SpO2: 93% 94% 95% 95%  Weight:      Height:        Intake/Output Summary (Last 24 hours) at 06/19/2024 2242 Last data filed at 06/19/2024 0700 Gross per 24 hour  Intake 120 ml  Output --  Net 120 ml   Filed Weights   06/04/24 1252  Weight: 67 kg    Examination:  Gen: NAD, A&Ox3, appears significantly older than stated age HEENT: NCAT, EOMI Neck: Supple, no JVD, no LAD CV: RRR, no murmurs Resp: normal WOB, faint crackles at left lung base Abd: Soft, NTND, no guarding Ext: No LE edema, pulses 2+ b/l, left foot with dressing c/d/I and intact neurovascularly Skin: Warm, dry, Neuro:  No focal deficits Psych: Calm, cooperative, appropriate affect  Data Reviewed: I have personally reviewed following labs and imaging studies  CBC: Recent Labs  Lab 06/14/24 0217 06/15/24 0701 06/17/24 1204 06/18/24 0556 06/19/24 0325  WBC 10.3 11.7* 12.7* 10.3 9.1  HGB 10.2* 10.5* 10.8* 10.4* 9.8*  HCT 34.3* 35.7* 36.3 35.1* 33.5*  MCV 86.8 87.9 86.6 87.5 88.4  PLT 447* 460* 420* 387 354   Basic Metabolic Panel: Recent Labs  Lab 06/15/24 0701 06/16/24 0450 06/17/24 1204 06/18/24 0556 06/19/24 0325  NA 136 133* 137 134* 139  K 4.7 4.7 4.0 4.4 4.7  CL 94* 93* 94* 95*  94*  CO2 37* 31 35* 31 36*  GLUCOSE 265* 314* 131* 319* 222*  BUN 22* 29* 30* 34* 42*  CREATININE 0.50 0.62 0.62 0.63 0.68  CALCIUM  9.2 9.4 9.5 9.0 9.2   GFR: Estimated Creatinine Clearance: 82.7 mL/min (by C-G formula based on SCr of 0.68 mg/dL). Liver Function Tests: No results for input(s): AST, ALT, ALKPHOS, BILITOT, PROT, ALBUMIN in the last 168 hours. No results for input(s): LIPASE, AMYLASE in the last 168 hours. No results for input(s): AMMONIA in the last 168 hours. Coagulation Profile: No results for input(s): INR, PROTIME in the last 168 hours. Cardiac Enzymes: No results for input(s): CKTOTAL, CKMB, CKMBINDEX, TROPONINI in the last 168 hours. BNP (last 3 results) No results for input(s): PROBNP in the last 8760 hours. HbA1C: No results for input(s): HGBA1C in the last 72 hours. CBG: Recent Labs  Lab 06/18/24 2355 06/19/24 0626 06/19/24 1300 06/19/24 1628 06/19/24 2123  GLUCAP 195* 273* 169* 72 196*   Lipid Profile: No results for input(s): CHOL, HDL, LDLCALC, TRIG, CHOLHDL, LDLDIRECT in the last 72 hours. Thyroid Function Tests: No results for input(s): TSH, T4TOTAL, FREET4, T3FREE, THYROIDAB in the last 72 hours. Anemia Panel: No results for input(s): VITAMINB12, FOLATE, FERRITIN, TIBC, IRON, RETICCTPCT in the last 72 hours. Sepsis Labs: No results for input(s): PROCALCITON, LATICACIDVEN in the last 168 hours.  No results found for this or any previous visit (from the past 240 hours).   Radiology Studies: DG Foot Complete Left Result Date: 06/19/2024 CLINICAL DATA:  Pain. EXAM: LEFT FOOT - COMPLETE 3+ VIEW COMPARISON:  Radiographs and CT 06/04/2024 FINDINGS: Prior resection of the fifth ray. Erosive and destructive changes involving the base of the fourth metatarsal as well as lateral cuboid. Cortical irregularity has progressed from prior exam. Overlying skin irregularity with decreased  soft tissue gas. No new sites of bony destruction. No acute fracture. Vascular calcifications are seen. IMPRESSION: 1. Erosive and destructive changes involving the base of the fourth metatarsal as well as lateral cuboid, consistent with osteomyelitis. Cortical irregularity has progressed from prior exam. 2. Diminished soft tissue gas from prior exam. Residual skin and soft tissue irregularity persists. 3. Prior resection of the fifth ray. Electronically Signed   By: Andrea Gasman M.D.   On: 06/19/2024 19:53    Scheduled Meds:  acetaminophen   975 mg Oral TID   amoxicillin -clavulanate  1 tablet Oral Q12H   arformoterol   15 mcg Nebulization BID   budesonide  (PULMICORT ) nebulizer solution  0.25 mg Nebulization BID   vitamin B-12  500 mcg Oral Daily   enoxaparin  (LOVENOX ) injection  40 mg Subcutaneous Daily   feeding supplement (GLUCERNA SHAKE)  237 mL Oral BID BM   furosemide   20 mg Intravenous BID   insulin  aspart  0-5 Units Subcutaneous QHS   insulin  aspart  0-9 Units Subcutaneous TID WC  insulin  aspart  8 Units Subcutaneous TID WC   insulin  glargine  20 Units Subcutaneous Daily   linezolid   600 mg Oral Q12H   magnesium  oxide  400 mg Oral BID   multivitamin with minerals  1 tablet Oral Daily   nitroGLYCERIN   0.2 mg Transdermal Daily   nutrition supplement (JUVEN)  1 packet Oral BID WC   pantoprazole   40 mg Oral Daily   revefenacin   175 mcg Nebulization Daily   Continuous Infusions:   Unresulted Labs (From admission, onward)     Start     Ordered   06/20/24 0500  CBC  Tomorrow morning,   R       Question:  Specimen collection method  Answer:  Lab=Lab collect   06/19/24 2255   06/20/24 0500  Basic metabolic panel with GFR  Tomorrow morning,   R       Question:  Specimen collection method  Answer:  Lab=Lab collect   06/19/24 2255   06/15/24 0500  Creatinine, serum  (enoxaparin  (LOVENOX )    CrCl >/= 30 ml/min)  Weekly,   R     Comments: while on enoxaparin  therapy   Question:   Specimen collection method  Answer:  Lab=Lab collect   06/08/24 1045             LOS:  LOS: 15 days   Time Spent: 45 minutes  Tylene Quashie Al-Sultani, MD [facility]  If 7PM-7AM, please contact night-coverage  06/19/2024, 10:42 PM      "

## 2024-06-20 ENCOUNTER — Inpatient Hospital Stay (HOSPITAL_COMMUNITY)

## 2024-06-20 ENCOUNTER — Telehealth: Payer: Self-pay | Admitting: Acute Care

## 2024-06-20 ENCOUNTER — Other Ambulatory Visit (HOSPITAL_COMMUNITY): Payer: Self-pay

## 2024-06-20 DIAGNOSIS — Z87891 Personal history of nicotine dependence: Secondary | ICD-10-CM | POA: Diagnosis not present

## 2024-06-20 DIAGNOSIS — R918 Other nonspecific abnormal finding of lung field: Secondary | ICD-10-CM | POA: Diagnosis not present

## 2024-06-20 DIAGNOSIS — G4733 Obstructive sleep apnea (adult) (pediatric): Secondary | ICD-10-CM | POA: Diagnosis not present

## 2024-06-20 DIAGNOSIS — J9601 Acute respiratory failure with hypoxia: Secondary | ICD-10-CM | POA: Diagnosis not present

## 2024-06-20 LAB — CBC
HCT: 33.7 % — ABNORMAL LOW (ref 36.0–46.0)
Hemoglobin: 9.8 g/dL — ABNORMAL LOW (ref 12.0–15.0)
MCH: 26.1 pg (ref 26.0–34.0)
MCHC: 29.1 g/dL — ABNORMAL LOW (ref 30.0–36.0)
MCV: 89.6 fL (ref 80.0–100.0)
Platelets: 324 K/uL (ref 150–400)
RBC: 3.76 MIL/uL — ABNORMAL LOW (ref 3.87–5.11)
RDW: 14.4 % (ref 11.5–15.5)
WBC: 9 K/uL (ref 4.0–10.5)
nRBC: 0 % (ref 0.0–0.2)

## 2024-06-20 LAB — BASIC METABOLIC PANEL WITH GFR
Anion gap: 5 (ref 5–15)
BUN: 30 mg/dL — ABNORMAL HIGH (ref 6–20)
CO2: 37 mmol/L — ABNORMAL HIGH (ref 22–32)
Calcium: 9.3 mg/dL (ref 8.9–10.3)
Chloride: 95 mmol/L — ABNORMAL LOW (ref 98–111)
Creatinine, Ser: 0.6 mg/dL (ref 0.44–1.00)
GFR, Estimated: >60 mL/min
Glucose, Bld: 263 mg/dL — ABNORMAL HIGH (ref 70–99)
Potassium: 4.5 mmol/L (ref 3.5–5.1)
Sodium: 137 mmol/L (ref 135–145)

## 2024-06-20 LAB — BLOOD GAS, ARTERIAL
Acid-Base Excess: 14 mmol/L — ABNORMAL HIGH (ref 0.0–2.0)
Bicarbonate: 39.8 mmol/L — ABNORMAL HIGH (ref 20.0–28.0)
O2 Saturation: 94.6 %
Patient temperature: 36.8
pCO2 arterial: 56 mmHg — ABNORMAL HIGH (ref 32–48)
pH, Arterial: 7.46 — ABNORMAL HIGH (ref 7.35–7.45)
pO2, Arterial: 70 mmHg — ABNORMAL LOW (ref 83–108)

## 2024-06-20 LAB — GLUCOSE, CAPILLARY
Glucose-Capillary: 265 mg/dL — ABNORMAL HIGH (ref 70–99)
Glucose-Capillary: 187 mg/dL — ABNORMAL HIGH (ref 70–99)
Glucose-Capillary: 144 mg/dL — ABNORMAL HIGH (ref 70–99)
Glucose-Capillary: 206 mg/dL — ABNORMAL HIGH (ref 70–99)

## 2024-06-20 MED ORDER — LOPERAMIDE HCL 2 MG PO CAPS
2.0000 mg | ORAL_CAPSULE | ORAL | Status: DC | PRN
  Administered 2024-06-20 – 2024-06-23 (×9): 2 mg via ORAL
  Filled 2024-06-20 (×9): qty 1

## 2024-06-20 MED ORDER — MELATONIN 5 MG PO TABS
5.0000 mg | ORAL_TABLET | Freq: Every evening | ORAL | Status: AC | PRN
  Administered 2024-06-20 – 2024-06-21 (×2): 5 mg via ORAL
  Filled 2024-06-20 (×2): qty 1

## 2024-06-20 MED ORDER — INSULIN GLARGINE 100 UNIT/ML ~~LOC~~ SOLN
25.0000 [IU] | Freq: Every day | SUBCUTANEOUS | Status: DC
  Administered 2024-06-21 – 2024-06-23 (×3): 25 [IU] via SUBCUTANEOUS
  Filled 2024-06-20 (×3): qty 0.25

## 2024-06-20 MED ORDER — INFLUENZA VIRUS VACC SPLIT PF (FLUZONE) 0.5 ML IM SUSY
0.5000 mL | PREFILLED_SYRINGE | INTRAMUSCULAR | Status: DC
  Filled 2024-06-20: qty 0.5

## 2024-06-20 MED ORDER — CYCLOBENZAPRINE HCL 5 MG PO TABS
5.0000 mg | ORAL_TABLET | Freq: Once | ORAL | Status: AC
  Administered 2024-06-20: 5 mg via ORAL
  Filled 2024-06-20: qty 1

## 2024-06-20 MED ORDER — BUDESON-GLYCOPYRROL-FORMOTEROL 160-9-4.8 MCG/ACT IN AERO
2.0000 | INHALATION_SPRAY | Freq: Two times a day (BID) | RESPIRATORY_TRACT | Status: DC
  Administered 2024-06-21 – 2024-06-23 (×5): 2 via RESPIRATORY_TRACT
  Filled 2024-06-20: qty 5.9

## 2024-06-20 NOTE — TOC Progression Note (Addendum)
 Transition of Care [facility]) - Progression Note    Patient Details  Name: Bianca Yang MRN: [id number] Date of Birth: [birth year]  [facility]) CM/SW Contact  Waddell Barnie Rama, RN Phone Number: 06/20/2024, 12:12 PM  Clinical Narrative:    NCM notified she will need w/chair and bsc, [employer] will deliver this to the room prior to dc. She will also need home oxygen, [employer] will supply the home oxygen. She may go home on 5 to 6 liters.  Which mean she will need a 10 liter concentrator.    Expected Discharge Plan: Skilled Nursing Facility Barriers to Discharge: Continued Medical Work up, SNF Pending bed offer, Insurance Authorization               Expected Discharge Plan and Services In-house Referral: Clinical Social Work     Living arrangements for the past 2 months: Single Family Home                                       Social Drivers of Health (SDOH) Interventions SDOH Screenings   Food Insecurity: Food Insecurity Present (06/04/2024)  Housing: High Risk (06/04/2024)  Transportation Needs: Unmet Transportation Needs (06/04/2024)  Utilities: At Risk (06/04/2024)  Tobacco Use: Medium Risk (06/04/2024)    Readmission Risk Interventions     No data to display

## 2024-06-20 NOTE — Progress Notes (Signed)
 SATURATION QUALIFICATIONS: (This note is used to comply with regulatory documentation for home oxygen)  Patient Saturations on Room Air at Rest = 89%  Patient Saturations on Room Air while Ambulating = 78%  Patient Saturations on 4 Liters of oxygen while Ambulating = 89%  Please briefly explain why patient needs home oxygen: Pt hypoxic on RA with standing and ambulation, needed O2 increase to 4L O2 [place] to maintain SpO2 >88%, and up to 5L to maintain SpO2 within goal of >92%.

## 2024-06-20 NOTE — Progress Notes (Signed)
 Physical Therapy Treatment Patient Details Name: Bianca Yang MRN: [id number] DOB: 07-22-1975 Today's Date: 06/20/2024   History of Present Illness Pt is a 48 y.o. F presenting to [facility] on 06/04/24 with L foot pain and swelling following L foot 5th ray amputation on 05/05/24. Dehisence of wound. IMG done on 12/11 suggesting multifocal PNA. Stitches removed on 12/17. PMH is significant for DM, cocaine abuse.    PT Comments  Pt received in supine, agreeable to therapy session with encouragement, with goal of working on ambulatory saturation assessment as well as stair negotiation, with father present for caregiver instruction during session. Pt remains impulsive but with cues, mostly able to maintain LLE NWB status with transfers/short distance hopping with RW and hopping up/down step backward x2 trials with RW support. Due to pt impulsivity and decreased balance with NWB status, recommend family use wheelchair to bump her up/down stairs into/out of father's home, which has smaller than standard height steps to enter without rails. Pt and father given handouts to reinforce safe technique to enter home up stairs via wheelchair vs with RW and no rail. Pt hypoxic on RA with standing and ambulation, needed O2 increase to 4L O2 Bianca Yang to maintain SpO2 >88%, and up to 5L to maintain SpO2 within MD goal of >92%. Pt continues to benefit from PT services to progress toward functional mobility goals, she would benefit from <3 hours/day of post-acute therapies, but since no facilities available, recommend home with HHPT and DME to allow her to mobilize in home safely.    If plan is discharge home, recommend the following: A lot of help with walking and/or transfers;A lot of help with bathing/dressing/bathroom;Assistance with cooking/housework;Direct supervision/assist for medications management;Direct supervision/assist for financial management;Assist for transportation;Help with stairs or ramp for entrance (+2 for  stair negotiation)   Can travel by private vehicle        Equipment Recommendations  Wheelchair (measurements PT);Wheelchair cushion (measurements PT);BSC/3in1;Other (comment);Rolling walker (2 wheels) (Tub bench; pt may be able to borrow a RW from family)    Recommendations for Other Services       Precautions / Restrictions Precautions Precautions: Fall Recall of Precautions/Restrictions: Impaired Precaution/Restrictions Comments: impulsive, watch O2 does not use at baseline Restrictions Weight Bearing Restrictions Per Provider Order: Yes LLE Weight Bearing Per Provider Order: Non weight bearing     Mobility  Bed Mobility Overal bed mobility: Modified Independent                  Transfers Overall transfer level: Needs assistance Equipment used: Rolling walker (2 wheels) Transfers: Sit to/from Stand, Bed to chair/wheelchair/BSC Sit to Stand: Supervision   Step pivot transfers: Supervision, Contact guard assist       General transfer comment: Pt forgetting to reach back when fatigued prior to sitting in wheelchair, once cued pt able to reach back appropriately. PT tending to plop when fatigued and sitting on EOB. Cues to avoid pressure on LLE when standing to don pants from [facility] after toileting.    Ambulation/Gait Ambulation/Gait assistance: Supervision, Contact guard assist, +2 safety/equipment Gait Distance (Feet): 30 Feet (24ft, pushed to stairs in WC, then ~29ft, then pushed back to room in [facility]) Assistive device: Rolling walker (2 wheels) Gait Pattern/deviations: Trunk flexed, Step-to pattern Gait velocity: reduced     General Gait Details: slowed hop-to gait, good maintenance of WB restrictions with PTA supervision, pt self-limiting distance due to fatigue and wanting to save energy for stairs, then once done with stair training, pt c/o  pain/fatigue. Mild DOE and pt appears to hold her breath often, cues for pursed-lipped breathing. Hypoxia on RA during  ambulatory saturation assessment, needed 4L O2 [name] to maintain >88% and 5L O2 Bianca Yang to maintain SpO2 at goal of >92%.   Stairs Stairs: Yes Stairs assistance: Min assist, +2 safety/equipment Stair Management: Backwards (hop-to) Number of Stairs: 2 (1 step x2 reps at wide stairwell outside 5N) General stair comments: Visual/verbal demo for pt on backward hop-up to maintain LLE NWB. Pt also shown how to use shower stool to ascend stairs on bottom if she has a handle or wall rail installed that she can hold on to while squatting to raise shower stool to next step. However, this will not work if she has no rails. Pt father Bianca Yang present while pt instructed on stair negotiation per PTA insistence. Pt initially hops up one step backward with support of RW, then reports she is too anxious to perform again, but with encouragement she performs one more up/down on first step. Pt defers to perform 3 steps, stating my stairs at home are smaller and it's only 1 (another time she reports 2 STE her father's home)   [employer] mobility: Yes Wheelchair propulsion:  (see below; instruction on safety with parts mgmt/brakes but pt declines to propel due to urinary urgency) Wheelchair parts: Needs assistance Distance:  (N/A; parts/safety instruction only) Wheelchair Assistance Details (indicate cue type and reason): Pt instructed on wheelchair parts/safety, pt able to demo back use of brakes and leg rest removal. Pt reports she understands how to propel and turn WC, however did not have her demo back as instruction occurred once pt already back in room and pt with urinary urgency; after toileting, pt defers further WC mobility and requests back to bed due to c/o pain/fatigue   Tilt Bed    Modified Rankin (Stroke Patients Only)       Balance Overall balance assessment: Needs assistance Sitting-balance support: No upper extremity supported, Feet supported Sitting  balance-Leahy Scale: Good     Standing balance support: Bilateral upper extremity supported, During functional activity, Reliant on assistive device for balance Standing balance-Leahy Scale: Poor Standing balance comment: reliant on RW for support, NWB to LLE, when pulling up pants pt needing CGA to light minA to maintain L NWB/stabilize RW and possibly to manage clothing safely                            Communication Communication Communication: No apparent difficulties  Cognition Arousal: Alert Behavior During Therapy: Impulsive, Restless   PT - Cognitive impairments: Awareness, Safety/Judgement, Attention, Problem solving                       PT - Cognition Comments: Pt is able to maintain WB precautions when supervised during session but does appear to apply light pressure through L foot when standing to adjust pants after toileting, pt cued by PTA to avoid placing foot on floor. PTA reinforced reasons for NWB on RLE and recommending WC for her for safely mobilizing at home, RN and case mgmt also notified. Occasional cues for rest breaks and attention to task, as well as activity pacing. PTA reviewed WC parts/mgmt, pt will need reinforcement due to decreased overall safety with transfers and pt tending to forget to reach back. Following commands: Intact      Cueing Cueing Techniques: Verbal cues, Gestural cues  Exercises Other Exercises  Other Exercises: PTA reinforced that [facility] PT will instruct her on HEP when they evaluate her (HHPT is ordered for her per social work notes).    General Comments General comments (skin integrity, edema, etc.): hypoxic on RA, see comments above; needed 4-5L O2 [place] to maintain SpO2 >88% and >92%      Pertinent Vitals/Pain Pain Assessment Pain Assessment: 0-10 Pain Score: 9  Pain Location: L foot Pain Descriptors / Indicators: Sore, Guarding, Grimacing Pain Intervention(s): Limited activity within patient's tolerance, Monitored  during session, Repositioned, Patient requesting pain meds-RN notified, Other (comment) (RN notified after session; pt without c/o pain until after session)    Home Living                          Prior Function            PT Goals (current goals can now be found in the care plan section) Acute Rehab PT Goals Patient Stated Goal: to get better and go home Progress towards PT goals: Progressing toward goals    Frequency    Min 2X/week      PT Plan      Co-evaluation              AM-PAC PT 6 Clicks Mobility   Outcome Measure  Help needed turning from your back to your side while in a flat bed without using bedrails?: None Help needed moving from lying on your back to sitting on the side of a flat bed without using bedrails?: None Help needed moving to and from a bed to a chair (including a wheelchair)?: A Little Help needed standing up from a chair using your arms (e.g., wheelchair or bedside chair)?: A Little Help needed to walk in hospital room?: A Little Help needed climbing 3-5 steps with a railing? : A Lot (mod safety cues, +2 for safety/line mgmt) 6 Click Score: 19    End of Session Equipment Utilized During Treatment: Gait belt;Oxygen Activity Tolerance: Patient tolerated treatment well;Patient limited by pain;Other (comment);Patient limited by fatigue (urinary urgency limiting gait/WC mobility participation/distance) Patient left: in bed;with call bell/phone within reach;with bed alarm set;with family/visitor present;Other (comment) (pt's father present; pt refusing OOB to chair despite encouragement on benefits) Nurse Communication: Mobility status;Patient requests pain meds;Other (comment) (case mgr and RN notified on pt DME needs for home and amb sats note in; pt asking to speak with MD about DC plan) PT Visit Diagnosis: Unsteadiness on feet (R26.81);Muscle weakness (generalized) (M62.81);Pain Pain - Right/Left: Left Pain - part of body: Ankle  and joints of foot     Time: 8877-8843 PT Time Calculation (min) (ACUTE ONLY): 34 min  Charges:    $Gait Training: 8-22 mins $Wheel Chair Management: 8-22 mins PT General Charges $$ ACUTE PT VISIT: 1 Visit                     Bianca Yang P., PTA Acute Rehabilitation Services Secure Chat Preferred 9a-5:30pm Office: [phone]    Bianca Yang 06/20/2024, 12:38 PM

## 2024-06-20 NOTE — Progress Notes (Signed)
 "  NAME:  Bianca Yang, MRN:  [id number], DOB:  05-May-1976, LOS: 16 ADMISSION DATE:  06/04/2024, CONSULTATION DATE:  06/20/2024 REFERRING MD:  Al-Sultani, Anmar, MD, CHIEF COMPLAINT:  respiratory failure   History of Present Illness:  Bianca Yang is a 48 y.o. woman with past medical history of type 2 diabetes and chronic diabetic foot wounds, OSA not on CPAP, and tobacco use disorder smoking most recently a month ago.  She is here after having a recent fifth ray amputation with Dr. Harden.  She did not take her antibiotics.  She did not have her sutures removed.  She presents with fevers worsening pain and drainage from the ulcers.  She has acute osteomyelitis of the left foot.  She is on IV antibiotics.  She is having fevers.  This morning she was more encephalopathic and hypoxemic.  Chest x-ray is obtained which shows multifocal pneumonia.  She was started on azithromycin  in addition to the Unasyn  she is already on for osteomyelitis.  PCCM was consulted for increased oxygen apartment.  On my exam the patient is somnolent, snoring, she arouses and I was able to wean her down from 10LNC to [facility]. History if very limited due to her lack of cooperation with the interview.    PCCM signed off after initial consult 12/11  Called back 12/23 to eval for persistent hypoxemia   Pertinent  Medical History  Type 2 DM  History of tobacco use GERD  Significant Hospital Events: Including procedures, antibiotic start and stop dates in addition to other pertinent events     Interim History / Subjective:   Called back for persistent hypoxia  Oscillates high 80s to high 90s while eating  Objective    Blood pressure 128/70, pulse 84, temperature 98.3 F (36.8 C), resp. rate 16, height 5' 7 (1.702 m), weight 67 kg, SpO2 94%.    FiO2 (%):  [36 %] 36 %   Intake/Output Summary (Last 24 hours) at 06/20/2024 1428 Last data filed at 06/20/2024 1100 Gross per 24 hour  Intake 360 ml  Output --  Net  360 ml   Filed Weights   06/04/24 1252  Weight: 67 kg    Examination: General: chronically ill middle aged F NAD  HENT: NCAT poor dentition  Lungs: CTAb on 4.5L  Cardiovascular: cap refill < 3 sec  Abdomen: soft  Extremities: L foot is wrapped  Neuro: AAOx4   Resolved problem list   Assessment and Plan    Acute hypoxic respiratory failure - slowly has improved since admission however still w new O2 req from baseline  OSA, not on CPAP  GGOs, bilateral lower lobe ASD   Hx tobacco use Hx smoking crack  P -CXR, ABG  -on augmentin , linezolid  -- planned for a long PO course (osteomyelitis L foot)  -on 20mg  lasix  BID  -on triple therapy -- will talk to my attending re discharge recs (Im not sure if certain options are more affordable etc)  -has been working w PT, working on [employer], mobility  -I am not sure that there is something I can add which would significantly change her O2 req in a relatively quick period of time. Wondering if we are looking at (or near) a new baseline and if she may be nearing dc optimization.  -have placed referral for outpt pulm  -congratulated on her voiced motivation to stop smoking crack  -encouraged tobacco cessation      Labs   CBC: Recent Labs  Lab  06/15/24 0701 06/17/24 1204 06/18/24 0556 06/19/24 0325 06/20/24 0432  WBC 11.7* 12.7* 10.3 9.1 9.0  HGB 10.5* 10.8* 10.4* 9.8* 9.8*  HCT 35.7* 36.3 35.1* 33.5* 33.7*  MCV 87.9 86.6 87.5 88.4 89.6  PLT 460* 420* 387 354 324    Basic Metabolic Panel: Recent Labs  Lab 06/16/24 0450 06/17/24 1204 06/18/24 0556 06/19/24 0325 06/20/24 0432  NA 133* 137 134* 139 137  K 4.7 4.0 4.4 4.7 4.5  CL 93* 94* 95* 94* 95*  CO2 31 35* 31 36* 37*  GLUCOSE 314* 131* 319* 222* 263*  BUN 29* 30* 34* 42* 30*  CREATININE 0.62 0.62 0.63 0.68 0.60  CALCIUM  9.4 9.5 9.0 9.2 9.3   GFR: Estimated Creatinine Clearance: 82.7 mL/min (by C-G formula based on SCr of 0.6 mg/dL). Recent Labs  Lab  06/17/24 1204 06/18/24 0556 06/19/24 0325 06/20/24 0432  WBC 12.7* 10.3 9.1 9.0    Liver Function Tests: No results for input(s): AST, ALT, ALKPHOS, BILITOT, PROT, ALBUMIN in the last 168 hours.  No results for input(s): LIPASE, AMYLASE in the last 168 hours. No results for input(s): AMMONIA in the last 168 hours.  ABG    Component Value Date/Time   PHART 7.38 06/08/2024 0902   PCO2ART 68 (HH) 06/08/2024 0902   PO2ART 60 (L) 06/08/2024 0902   HCO3 40.2 (H) 06/08/2024 0902   TCO2 41 (H) 06/04/2024 1648   O2SAT 90.3 06/08/2024 0902     Coagulation Profile: No results for input(s): INR, PROTIME in the last 168 hours.  Cardiac Enzymes: No results for input(s): CKTOTAL, CKMB, CKMBINDEX, TROPONINI in the last 168 hours.   HbA1C: Hgb A1c MFr Bld  Date/Time Value Ref Range Status  05/04/2024 03:21 AM 11.6 (H) 4.8 - 5.6 % Final    Comment:    (NOTE) Diagnosis of Diabetes The following HbA1c ranges recommended by the American Diabetes Association (ADA) may be used as an aid in the diagnosis of diabetes mellitus.  Hemoglobin             Suggested A1C NGSP%              Diagnosis  <5.7                   Non Diabetic  5.7-6.4                Pre-Diabetic  >6.4                   Diabetic  <7.0                   Glycemic control for                       adults with diabetes.    01/16/2020 06:10 AM 12.0 (H) 4.8 - 5.6 % Final    Comment:    (NOTE) Pre diabetes:          5.7%-6.4%  Diabetes:              >6.4%  Glycemic control for   <7.0% adults with diabetes     CBG: Recent Labs  Lab 06/19/24 1300 06/19/24 1628 06/19/24 2123 06/20/24 0609 06/20/24 1119  GLUCAP 169* 72 196* 265* 187*    Mod MDM   Ronnald Gave MSN, AGACNP-BC [place] [facility] Amion for pager 06/20/2024, 2:28 PM          "

## 2024-06-20 NOTE — Progress Notes (Signed)
 " PROGRESS NOTE    Bianca Yang  FMW:[id number] DOB: August 27, 1975 DOA: 06/04/2024 PCP: Patient, [name] Per    Brief Narrative:  Patient is a 48 year old female with PMHx of recent left fifth metatarsal amputation, uncontrolled T2DM, OSA not on CPAP, PAD, substance abuse disorder including cocaine and opiates who presented to the ED on 06/04/2024 complaining of left foot pain, erythema, and swelling.  Patient underwent left fifth metatarsal amputation for osteomyelitis with Dr. Harden on 05/05/2024 and was discharged on 05/09/2024 with doxycycline , with cultures later growing MRSA and Streptococcus constellatus, for which Augmentin  was called in, however patient reportedly did not take either antibiotic and did not attend postop follow-up visits.  In the ED, she was noted to be afebrile with mild leukocytosis.  ESR elevated 41, CRP 2.9.  X-ray of left foot showed large lateral midfoot ulceration with lytic destruction of the cuboid and proximal base of the fourth metatarsal suspicious for osteomyelitis.  CT scan of the foot showed acute osteomyelitis involving the fourth metatarsal and cuboid.  Was evaluated by Dr. Harden on admission who noted wound dehiscence and bone changes of the fourth metatarsal and cuboid, recommended nonweightbearing and IV antibiotics, noting bone marrow edema with no plans for surgical intervention at this time. She was started on Vancomycin  and Unasyn .  Hospital course was complicated by fevers, worsening leukocytosis, and acute on chronic hypoxic and hypercapnic respiratory failure with escalating O2 requirements up to 15 L HFNC, thought to be secondary to multifocal pneumonia in the setting of likely untreated OSA, for which she received 3 days of azithromycin  in addition to Vanc/Unasyn .  ID was consulted and she was ultimately transitioned to p.o. linezolid  and Augmentin  on 06/14/2024 for 6 weeks duration.  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 Harden, noted no surgical intervention, continue nonweightbearing, continue Vashe dressing changes at discharge - Received approximately 9 days of IV vancomycin  and Unasyn  then switched to p.o. linezolid  and Augmentin  on 06/14/2024 by ID for 6 weeks and outpatient follow-up with ID clinic - Patient reported bearing weight on left foot while in the bathroom, feeling a pop and having some medial discomfort, requesting imaging - Left foot xray (12/22) showed progressive cortical erosive/destructive changes of the 4th metatarsal base and lateral cuboid consistent with osteomyelitis, with decreased soft tissue gas and no new bony destruction status post 5th ray resection  Acute on chronic hypoxic hypercapnic respiratory failure - improving - VBG 12/7 showed pH 7.36, pCO2 68.6, bicarb 39.1 - Had escalating O2 requirement, up to 15L HFNC, which has since been weaned down to [place] - ABG 12/11 showed pH 7.38, pO2 60, pCO2 68, bicarb 40.2 - PCCM evaluated patient on 12/11, indicated hypoxemia likely in part caused by untreated OSA and somnolence leading to hypoventilation - CTA (12/17) was negative for PE, showed mild patchy airspace consolidation in bilateral lower lobes concerning for pneumonia - Continue Brovana , Pulmicort , and Yupelri  nebs - Patient's O2 requirement continue to fluctuate -- she is documented as being on 4.5L overnight and this morning with SpO2 at 91-96%. With PT, SpO2 was 89% on RA, 78% while ambulating on RA, 89% while ambulating on 4L, requiring 5L to maintain SpO2 at > 92%. On evaluation at bedside around 1:45 PM, the patient was at rest sitting in bed with an SpO2 of 84-88% on 5L. RN was notified.  -  TOC informed that patient may require 5-6L/min [place] on discharge requiring the 10L concentrator  - Continue to wean O2 as tolerated - ABG pH 7.46, pCO2 56, p70,  bicarb 40 on 5L [place] - Was hoping to aim for an O2 requirement of 2-3 L/min for discharge home but I am becoming less confident that the patient will sustain that any time soon. She may require 4-5L at baseline with 5-6L on exertion. I have consulted PCCM for recommendations on optimization for discharge.   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 - TTE (12/11) showed mildly reduced LV systolic function (EF 45 to 50%) with abnormal septal motion/inferior basal hypokinesis, mildly enlarged RV with mildly reduced function, trivial MR, dilated IVC with >50% collapse consistent with RAP 8 mmHg - Appears euvolemic - Continue IV Lasix  20 mg daily - Outpatient follow up with cardiology  Uncontrolled T2DM - Hgb A1c 11.6 (05/04/2024) - Holding home metformin  and glimepiride  - Increased Lantus  to 25 units, continue Novolog  8 units TID premeal, SSI - DM educator following - Will be discharged on insulin   Normocytic anemia - 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 inflammation  - Hgb relatively stable but down from recent baseline of 12-13  Enlarged thyroid gland with left nodule - CTA chest showed incidental finding of enlarged and heterogeneous thyroid gland with a left thyroid nodule measuring 3.6 x 2.4 cm increased in size - Recommended follow-up with nonemergent thyroid ultrasound  Polysubstance abuse - UDS positive for cocaine and opiates  - Counseled on need for cessation  History of tobacco abuse - Recently quit less than 1 month ago, previously smoking less than a pack/day for 28 years   DVT prophylaxis:  enoxaparin  (LOVENOX ) injection 40 mg Start: 06/08/24 1100 SCDs Start: 06/08/24 1046 SCDs Start: 06/04/24 1922   Code Status:   Code Status: Full Code  Family Communication: Discussed with patient's father at bedside.  He is concerned about taking her home with this high open oxygen requirement due to having to take care of her by himself.  Disposition Plan: Was denied from SNFs. Plan to go Home with [facility] with assistance of her father if O2 remains stable at 2-3L [place] today  PT - Follow Up Recommendations: Home health PT (with assist of father and his brother, will need to bump WC up stairs; pt/father agreeable to this) - PT equipment: Wheelchair (measurements PT), Wheelchair cushion (measurements PT), BSC/3in1, Other (comment), Rolling walker (2 wheels) (Tub bench; pt may be able to borrow a RW from family) OT - Follow Up Recommendations: Home health OT -    Level of care: Telemetry  Consultants:  Ortho, ID, PCCM  Procedures:  None  Antimicrobials: Vanc and Unaysn > PO Linezolid  and Augmentin    Subjective: Patient was examined at bedside.  Patient's father was also present.  Overall, patient reports doing well and was inquiring about discharge.  However, at that time, the patient was resting in bed and noted to have an SpO2 ranging from 84 to 88% on 5 L nasal cannula.  She denies shortness of breath, chest pain, or cough.  Patient's father is concerned about discharge  home with her oxygen saturations fluctuating and dropping to mid 80s%.   Objective: Vitals:   06/20/24 0341 06/20/24 0800 06/20/24 0816 06/20/24 0817  BP: 130/65 128/70    Pulse: 79 84    Resp: 18 16    Temp: 98.6 F (37 C) 98.3 F (36.8 C)    TempSrc:      SpO2: 91% 96% 94% 94%  Weight:      Height:        Intake/Output Summary (Last 24 hours) at 06/20/2024 1415 Last data filed at 06/20/2024 1100 Gross per 24 hour  Intake 360 ml  Output --  Net 360 ml   Filed Weights   06/04/24 1252  Weight: 67 kg     Examination:  Gen: NAD, A&Ox3, appears significantly older than stated age HEENT: NCAT, EOMI Neck: Supple, no JVD CV: RRR, no murmurs Resp: normal WOB, CTAB, no wheezing or crackles, [place] in place at 5L SpO2 84-88%  Abd: Soft, NTND, no guarding Ext: No LE edema, pulses 2+ b/l, left foot with dressing c/d/I and intact neurovascularly Skin: Warm, dry Neuro: No focal deficits Psych: Calm, cooperative, appropriate affect  Data Reviewed: I have personally reviewed following labs and imaging studies  CBC: Recent Labs  Lab 06/15/24 0701 06/17/24 1204 06/18/24 0556 06/19/24 0325 06/20/24 0432  WBC 11.7* 12.7* 10.3 9.1 9.0  HGB 10.5* 10.8* 10.4* 9.8* 9.8*  HCT 35.7* 36.3 35.1* 33.5* 33.7*  MCV 87.9 86.6 87.5 88.4 89.6  PLT 460* 420* 387 354 324   Basic Metabolic Panel: Recent Labs  Lab 06/16/24 0450 06/17/24 1204 06/18/24 0556 06/19/24 0325 06/20/24 0432  NA 133* 137 134* 139 137  K 4.7 4.0 4.4 4.7 4.5  CL 93* 94* 95* 94* 95*  CO2 31 35* 31 36* 37*  GLUCOSE 314* 131* 319* 222* 263*  BUN 29* 30* 34* 42* 30*  CREATININE 0.62 0.62 0.63 0.68 0.60  CALCIUM  9.4 9.5 9.0 9.2 9.3   GFR: Estimated Creatinine Clearance: 82.7 mL/min (by C-G formula based on SCr of 0.6 mg/dL). Liver Function Tests: No results for input(s): AST, ALT, ALKPHOS, BILITOT, PROT, ALBUMIN in the last 168 hours. No results for input(s): LIPASE, AMYLASE in the last 168 hours. No results for input(s): AMMONIA in the last 168 hours. Coagulation Profile: No results for input(s): INR, PROTIME in the last 168 hours. Cardiac Enzymes: No results for input(s): CKTOTAL, CKMB, CKMBINDEX, TROPONINI in the last 168 hours. BNP (last 3 results) No results for input(s): PROBNP in the last 8760 hours. HbA1C: No results for input(s): HGBA1C in the last 72 hours. CBG: Recent Labs  Lab 06/19/24 1300 06/19/24 1628 06/19/24 2123 06/20/24 0609 06/20/24 1119  GLUCAP 169* 72 196*  265* 187*   Lipid Profile: No results for input(s): CHOL, HDL, LDLCALC, TRIG, CHOLHDL, LDLDIRECT in the last 72 hours. Thyroid Function Tests: No results for input(s): TSH, T4TOTAL, FREET4, T3FREE, THYROIDAB in the last 72 hours. Anemia Panel: No results for input(s): VITAMINB12, FOLATE, FERRITIN, TIBC, IRON, RETICCTPCT in the last 72 hours. Sepsis Labs: No results for input(s): PROCALCITON, LATICACIDVEN in the last 168 hours.  No results found for this or any previous visit (from the past 240 hours).   Radiology Studies: DG Foot Complete Left Result Date: 06/19/2024 CLINICAL DATA:  Pain. EXAM: LEFT FOOT - COMPLETE 3+ VIEW COMPARISON:  Radiographs and CT 06/04/2024 FINDINGS: Prior resection of the fifth ray. Erosive and destructive changes involving the base of the fourth metatarsal as well as lateral cuboid. Cortical  irregularity has progressed from prior exam. Overlying skin irregularity with decreased soft tissue gas. No new sites of bony destruction. No acute fracture. Vascular calcifications are seen. IMPRESSION: 1. Erosive and destructive changes involving the base of the fourth metatarsal as well as lateral cuboid, consistent with osteomyelitis. Cortical irregularity has progressed from prior exam. 2. Diminished soft tissue gas from prior exam. Residual skin and soft tissue irregularity persists. 3. Prior resection of the fifth ray. Electronically Signed   By: Andrea Gasman M.D.   On: 06/19/2024 19:53    Scheduled Meds:  acetaminophen   975 mg Oral TID   amoxicillin -clavulanate  1 tablet Oral Q12H   arformoterol   15 mcg Nebulization BID   budesonide  (PULMICORT ) nebulizer solution  0.25 mg Nebulization BID   vitamin B-12  500 mcg Oral Daily   enoxaparin  (LOVENOX ) injection  40 mg Subcutaneous Daily   feeding supplement (GLUCERNA SHAKE)  237 mL Oral BID BM   furosemide   20 mg Intravenous BID   influenza vac split trivalent PF  0.5 mL  Intramuscular Tomorrow-1000   insulin  aspart  0-5 Units Subcutaneous QHS   insulin  aspart  0-9 Units Subcutaneous TID WC   insulin  aspart  8 Units Subcutaneous TID WC   insulin  glargine  20 Units Subcutaneous Daily   linezolid   600 mg Oral Q12H   magnesium  oxide  400 mg Oral BID   multivitamin with minerals  1 tablet Oral Daily   nitroGLYCERIN   0.2 mg Transdermal Daily   nutrition supplement (JUVEN)  1 packet Oral BID WC   pantoprazole   40 mg Oral Daily   revefenacin   175 mcg Nebulization Daily   Continuous Infusions:   Unresulted Labs (From admission, onward)     Start     Ordered   06/15/24 0500  Creatinine, serum  (enoxaparin  (LOVENOX )    CrCl >/= 30 ml/min)  Weekly,   R     Comments: while on enoxaparin  therapy   Question:  Specimen collection method  Answer:  Lab=Lab collect   06/08/24 1045             LOS:  LOS: 16 days   Time Spent: 45 minutes  Derik Fults Al-Sultani, MD [facility]  If 7PM-7AM, please contact night-coverage  06/20/2024, 2:15 PM      "

## 2024-06-21 ENCOUNTER — Other Ambulatory Visit (HOSPITAL_COMMUNITY): Payer: Self-pay

## 2024-06-21 DIAGNOSIS — G4733 Obstructive sleep apnea (adult) (pediatric): Secondary | ICD-10-CM | POA: Diagnosis not present

## 2024-06-21 DIAGNOSIS — Z87891 Personal history of nicotine dependence: Secondary | ICD-10-CM | POA: Diagnosis not present

## 2024-06-21 DIAGNOSIS — E119 Type 2 diabetes mellitus without complications: Secondary | ICD-10-CM | POA: Diagnosis not present

## 2024-06-21 DIAGNOSIS — M86172 Other acute osteomyelitis, left ankle and foot: Secondary | ICD-10-CM | POA: Diagnosis not present

## 2024-06-21 LAB — GLUCOSE, CAPILLARY
Glucose-Capillary: 253 mg/dL — ABNORMAL HIGH (ref 70–99)
Glucose-Capillary: 197 mg/dL — ABNORMAL HIGH (ref 70–99)
Glucose-Capillary: 289 mg/dL — ABNORMAL HIGH (ref 70–99)
Glucose-Capillary: 194 mg/dL — ABNORMAL HIGH (ref 70–99)

## 2024-06-21 LAB — BASIC METABOLIC PANEL WITH GFR
Anion gap: 7 (ref 5–15)
BUN: 37 mg/dL — ABNORMAL HIGH (ref 6–20)
CO2: 39 mmol/L — ABNORMAL HIGH (ref 22–32)
Calcium: 9.8 mg/dL (ref 8.9–10.3)
Chloride: 92 mmol/L — ABNORMAL LOW (ref 98–111)
Creatinine, Ser: 0.71 mg/dL (ref 0.44–1.00)
GFR, Estimated: >60 mL/min
Glucose, Bld: 111 mg/dL — ABNORMAL HIGH (ref 70–99)
Potassium: 4.4 mmol/L (ref 3.5–5.1)
Sodium: 138 mmol/L (ref 135–145)

## 2024-06-21 MED ORDER — CYCLOBENZAPRINE HCL 5 MG PO TABS
5.0000 mg | ORAL_TABLET | Freq: Once | ORAL | Status: AC
  Administered 2024-06-21: 5 mg via ORAL
  Filled 2024-06-21: qty 1

## 2024-06-21 MED ORDER — FUROSEMIDE 20 MG PO TABS
20.0000 mg | ORAL_TABLET | Freq: Two times a day (BID) | ORAL | Status: DC
  Administered 2024-06-21 – 2024-06-23 (×4): 20 mg via ORAL
  Filled 2024-06-21 (×4): qty 1

## 2024-06-21 NOTE — Progress Notes (Signed)
 Physical Therapy Treatment Patient Details Name: Bianca Yang MRN: [id number] DOB: 10-27-1975 Today's Date: 06/21/2024   History of Present Illness Pt is a 48 y.o. F presenting to [facility] on 06/04/24 with L foot pain and swelling following L foot 5th ray amputation on 05/05/24. Dehisence of wound. IMG done on 12/11 suggesting multifocal PNA. Stitches removed on 12/17. PMH is significant for DM, cocaine abuse.    PT Comments  Pt tolerates treatment well, hopping for household distances with use of RW. Pt continues to require 4L of supplemental oxygen to maintain oxygen saturation at or above 88% when mobilizing. Pt does intermittently apply pressure through L foot during pivot transfers despite being aware of NWB status and being able to maintain NWB consistently during ambulation. PT continues to recommend HHPT.   Pt declined stair training during this session, reports she and her dad feel comfortable bumping up the steps as practiced in last PT visit on 06/20/2024.   If plan is discharge home, recommend the following: Assistance with cooking/housework;Direct supervision/assist for medications management;Direct supervision/assist for financial management;Assist for transportation;Help with stairs or ramp for entrance;A little help with bathing/dressing/bathroom;A little help with walking and/or transfers   Can travel by private vehicle        Equipment Recommendations  Wheelchair (measurements PT);Wheelchair cushion (measurements PT);BSC/3in1;Other (comment);Rolling walker (2 wheels) (tub bench)    Recommendations for Other Services       Precautions / Restrictions Precautions Precautions: Fall Recall of Precautions/Restrictions: Impaired Precaution/Restrictions Comments: impulsive, watch O2 does not use at baseline Restrictions Weight Bearing Restrictions Per Provider Order: Yes LLE Weight Bearing Per Provider Order: Non weight bearing     Mobility  Bed Mobility Overal bed  mobility: Modified Independent                  Transfers Overall transfer level: Needs assistance Equipment used: Rolling walker (2 wheels), None Transfers: Sit to/from Stand, Bed to chair/wheelchair/BSC Sit to Stand: Supervision Stand pivot transfers: Supervision         General transfer comment: supervision for transfers, without RW the pt does apply some pressure to L foot when performing pivot transfer to [facility]    Ambulation/Gait Ambulation/Gait assistance: Supervision Gait Distance (Feet): 80 Feet Assistive device: Rolling walker (2 wheels) Gait Pattern/deviations:  (hop-to gait, 2 brief standing rest breaks) Gait velocity: reduced         Stairs Stairs:  (pt declines stair training, plans to have her dad bump her up the steps in wheelchair)           Wheelchair Mobility     Tilt Bed    Modified Rankin (Stroke Patients Only)       Balance Overall balance assessment: Needs assistance Sitting-balance support: No upper extremity supported, Feet supported Sitting balance-Leahy Scale: Good     Standing balance support: Reliant on assistive device for balance, Single extremity supported Standing balance-Leahy Scale: Poor                              Communication Communication Communication: No apparent difficulties  Cognition Arousal: Alert Behavior During Therapy: Impulsive   PT - Cognitive impairments: Awareness, Safety/Judgement, Attention, Problem solving                       PT - Cognition Comments: pt continues to bear some weight through L foot during stand pivot transfers to bedside commode despite being able to verbalize  NWB status Following commands: Intact      Cueing Cueing Techniques: Verbal cues  Exercises      General Comments General comments (skin integrity, edema, etc.): pt on 4.5L [place] at rest, weaned to 4L by PT. Pt maintains sats at 88% or higher when mobilizing on 4L [place], desats quickly to low 80s  with attempts to wean to 3L at rest      Pertinent Vitals/Pain Pain Assessment Pain Assessment: Faces Faces Pain Scale: Hurts little more Pain Location: L foot Pain Descriptors / Indicators: Sore Pain Intervention(s): Monitored during session    Home Living                          Prior Function            PT Goals (current goals can now be found in the care plan section) Acute Rehab PT Goals Patient Stated Goal: to get better and go home Progress towards PT goals: Progressing toward goals    Frequency    Min 2X/week      PT Plan      Co-evaluation              AM-PAC PT 6 Clicks Mobility   Outcome Measure  Help needed turning from your back to your side while in a flat bed without using bedrails?: None Help needed moving from lying on your back to sitting on the side of a flat bed without using bedrails?: None Help needed moving to and from a bed to a chair (including a wheelchair)?: A Little Help needed standing up from a chair using your arms (e.g., wheelchair or bedside chair)?: A Little Help needed to walk in hospital room?: A Little Help needed climbing 3-5 steps with a railing? : A Lot 6 Click Score: 19    End of Session Equipment Utilized During Treatment: Gait belt;Oxygen Activity Tolerance: Patient tolerated treatment well;Patient limited by pain;Other (comment);Patient limited by fatigue Patient left: in chair;with call bell/phone within reach Nurse Communication: Mobility status;Patient requests pain meds PT Visit Diagnosis: Unsteadiness on feet (R26.81);Muscle weakness (generalized) (M62.81);Pain Pain - Right/Left: Left Pain - part of body: Ankle and joints of foot     Time: 1409-1430 PT Time Calculation (min) (ACUTE ONLY): 21 min  Charges:    $Gait Training: 8-22 mins PT General Charges $$ ACUTE PT VISIT: 1 Visit                     Bernardino JINNY Ruth, PT, DPT Acute Rehabilitation Office [phone]    Bernardino JINNY Ruth 06/21/2024, 2:42 PM

## 2024-06-21 NOTE — Progress Notes (Signed)
 " PROGRESS NOTE    Bianca Yang  FMW:[id number] DOB: 1976/06/21 DOA: 06/04/2024 PCP: Patient, [name] Per  Subjective: No acute events overnight. Seen and examined at bedside with family present. Reports she only tried the CPAP mask for 1-2 minutes last night as she couldn't handle the pressure. Reports having ongoing hypoxia worse with minimal ambulation. Denies nausea, vomiting, constipation.   Hospital Course: Bianca Yang is a 48 y.o. female with past medical history significant of DM2, diabetic foot ulcer, OSA, PAD presented to hospital with left foot pain and swelling.  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  In the ED patient was afebrile.  Labs were notable for mild leukocytosis but lactate was within normal range.  BMP showed mild hyponatremia with sodium of 131 with a glucose elevation at 483.  ESR was elevated at 41 with CRP of 2.9.  X-ray of the left foot showed large lateral midfoot ulceration with lytic destruction of the cuboid and proximal base of the fourth metatarsal suspicious osteomyelitis.  CT scan of the foot was then performed which showed acute osteomyelitis involving the fourth metatarsal and cuboid.  Patient was then considered for admission to the hospital for further evaluation and treatment Patient continued on IV antibiotics.  Also noted to be hypercapnic and hypoxic needing HFNC 12/11: Seen by PCCM   Assessment and Plan:  Acute on chronic hypoxic hypercapnic respiratory failure  - waxing and waning course - VBG 12/7 showed pH 7.36, pCO2 68.6, bicarb 39.1 - Had escalating O2 requirement, up to 15L HFNC, which has since been weaned down to [place] - ABG 12/11 showed pH 7.38, pO2 60, pCO2 68, bicarb 40.2 - PCCM evaluated patient on 12/11, indicated hypoxemia likely in part caused by untreated OSA and somnolence leading to hypoventilation -  CTA (12/17) was negative for PE, showed mild patchy airspace consolidation in bilateral lower lobes concerning for pneumonia - CTA 12/17 negative for PE - BLE duplex 12/8 negative for DVT  - Continue Brovana , Pulmicort , and Yupelri  nebs - Patient's O2 requirement continue to fluctuate, still requirign 4.5L  - TOC informed that patient may require 5-6L/min [place] on discharge requiring the 10L concentrator  - hoping to aim for an O2 requirement of 2-3 L/min for discharge home but I am becoming less confident that the patient will sustain that any time soon.  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 Harden, noted no surgical intervention, continue nonweightbearing, continue Vashe dressing changes at discharge - Patient reported bearing weight on left foot while in the bathroom, feeling a pop and having some medial discomfort, requesting imaging - Left foot xray (12/22) showed progressive cortical erosive/destructive changes of the 4th metatarsal base and lateral cuboid consistent with osteomyelitis, with decreased soft tissue gas and no new bony destruction status post 5th ray resection - Received approximately 9 days of IV vancomycin  and Unasyn  then switched to p.o. 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 - TTE (12/11) showed mildly reduced LV systolic  function (EF 45 to 50%) with abnormal septal motion/inferior basal hypokinesis, mildly enlarged RV with mildly reduced function, trivial MR, dilated IVC with >50% collapse consistent with RAP 8 mmHg - Appears euvolemic now - switch IV to PO Lasix  20 mg BID - Outpatient follow up with cardiology   Uncontrolled T2DM - Hgb A1c 11.6 (05/04/2024) - Holding home metformin  and glimepiride  - Increased Lantus  to 25 units, continue Novolog  8 units TID premeal, SSI - DM educator following - Will be discharged on insulin    Normocytic anemia - 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 inflammation  - Hgb relatively stable but down from recent baseline of 12-13   Enlarged thyroid gland with left nodule - CTA chest showed incidental finding of enlarged and heterogeneous thyroid gland with a left thyroid nodule measuring 3.6 x 2.4 cm increased in size - Recommended follow-up with nonemergent thyroid ultrasound   Polysubstance abuse - UDS positive for cocaine and opiates  - Counseled on need for cessation   History of tobacco abuse - Recently quit less than 1 month ago, previously smoking less than a pack/day for 28 years   DVT prophylaxis: enoxaparin  (LOVENOX ) injection 40 mg Start: 06/08/24 1100 SCDs Start: 06/08/24 1046 SCDs Start: 06/04/24 1922  Lovenox    Code Status: Full Code Family Communication: updated father at bedside Disposition Plan: Home with family assistance Reason for continuing need for hospitalization: severity of illness, wean oxygen requirements  Objective: Vitals:   06/20/24 1432 06/20/24 2048 06/21/24 0426 06/21/24 0818  BP:  132/74 124/74 102/62  Pulse:  90 76 79  Resp:  19 19 18   Temp:  99.7 F (37.6 C) 98.7 F (37.1 C) 98.8 F (37.1 C)  TempSrc:   Oral Oral  SpO2: 92% 95% 94% 95%  Weight:      Height:        Intake/Output Summary (Last 24 hours) at 06/21/2024 1454 Last data filed at 06/21/2024 0700 Gross per 24 hour  Intake 480 ml  Output --  Net 480 ml   Filed Weights    06/04/24 1252  Weight: 67 kg    Examination:  Physical Exam Vitals and nursing note reviewed.  Constitutional:      General: She is not in acute distress.    Appearance: She is ill-appearing.  HENT:     Head: Normocephalic and atraumatic.  Cardiovascular:     Rate and Rhythm: Normal rate and regular rhythm.     Pulses: Normal pulses.     Heart sounds: Normal heart sounds.  Pulmonary:     Effort: Pulmonary effort is normal. No respiratory distress.     Breath sounds: Normal breath sounds. No wheezing.  Abdominal:     General: Bowel sounds are normal. There is no distension.     Palpations: Abdomen is soft.     Tenderness: There is no abdominal tenderness.  Neurological:     Mental Status: She is alert.     Data Reviewed: I have personally reviewed following labs and imaging studies  CBC: Recent Labs  Lab 06/15/24 0701 06/17/24 1204 06/18/24 0556 06/19/24 0325 06/20/24 0432  WBC 11.7* 12.7* 10.3 9.1 9.0  HGB 10.5* 10.8* 10.4* 9.8* 9.8*  HCT 35.7* 36.3 35.1* 33.5* 33.7*  MCV 87.9 86.6 87.5 88.4 89.6  PLT 460* 420* 387 354 324   Basic Metabolic Panel: Recent Labs  Lab 06/17/24 1204 06/18/24 0556 06/19/24 0325 06/20/24 0432 06/21/24 0831  NA 137 134* 139 137 138  K 4.0 4.4 4.7 4.5 4.4  CL 94* 95* 94* 95* 92*  CO2 35* 31 36* 37* 39*  GLUCOSE 131* 319* 222* 263* 111*  BUN 30* 34* 42* 30* 37*  CREATININE 0.62 0.63 0.68 0.60 0.71  CALCIUM  9.5 9.0 9.2 9.3 9.8   GFR: Estimated Creatinine Clearance: 82.7 mL/min (by C-G formula based on SCr of 0.71 mg/dL). Liver Function Tests: No results for input(s): AST, ALT, ALKPHOS, BILITOT, PROT, ALBUMIN in the last 168 hours. No results for input(s): LIPASE, AMYLASE in the last 168 hours. No results for input(s): AMMONIA in the last 168 hours. Coagulation Profile: No results for input(s): INR, PROTIME in the last 168 hours. Cardiac Enzymes: No results for input(s): CKTOTAL, CKMB, CKMBINDEX,  TROPONINI in the last 168 hours. ProBNP, BNP (last 5 results) Recent Labs    06/08/24 0218  BNP 208.7*   HbA1C: No results for input(s): HGBA1C in the last 72 hours. CBG: Recent Labs  Lab 06/20/24 1119 06/20/24 1603 06/20/24 2103 06/21/24 0616 06/21/24 1119  GLUCAP 187* 144* 206* 253* 197*   Lipid Profile: No results for input(s): CHOL, HDL, LDLCALC, TRIG, CHOLHDL, LDLDIRECT in the last 72 hours. Thyroid Function Tests: No results for input(s): TSH, T4TOTAL, FREET4, T3FREE, THYROIDAB in the last 72 hours. Anemia Panel: No results for input(s): VITAMINB12, FOLATE, FERRITIN, TIBC, IRON, RETICCTPCT in the last 72 hours. Sepsis Labs: No results for input(s): PROCALCITON, LATICACIDVEN in the last 168 hours.  No results found for this or any previous visit (from the past 240 hours).   Radiology Studies: DG CHEST PORT 1 VIEW Result Date: 06/20/2024 EXAM: 1 VIEW(S) XRAY OF THE CHEST 06/20/2024 02:24:00 PM COMPARISON: 06/13/2024 CLINICAL HISTORY: Hypoxia FINDINGS: LUNGS AND PLEURA: Low lung volumes. Trace bilateral pleural effusions, decreased on right and similar on left. Diffuse interstitial prominence and peribronchial cuffing, stable. Increased linear opacities in the bilateral lower lungs. No pneumothorax. HEART AND MEDIASTINUM: Borderline cardiomegaly. BONES AND SOFT TISSUES: No acute osseous abnormality. IMPRESSION: 1. Increased  basilar atelectasis or pneumonia. 2. Similar peribronchial and interstitial coarsening suggesting edema. 3. Trace bilateral pleural effusions, decreased on the right and similar on the left. Electronically signed by: Norman Gatlin MD 06/20/2024 11:01 PM EST RP Workstation: [id number]   DG Foot Complete Left Result Date: 06/19/2024 CLINICAL DATA:  Pain. EXAM: LEFT FOOT - COMPLETE 3+ VIEW COMPARISON:  Radiographs and CT 06/04/2024 FINDINGS: Prior resection of the fifth ray. Erosive and destructive changes involving  the base of the fourth metatarsal as well as lateral cuboid. Cortical irregularity has progressed from prior exam. Overlying skin irregularity with decreased soft tissue gas. No new sites of bony destruction. No acute fracture. Vascular calcifications are seen. IMPRESSION: 1. Erosive and destructive changes involving the base of the fourth metatarsal as well as lateral cuboid, consistent with osteomyelitis. Cortical irregularity has progressed from prior exam. 2. Diminished soft tissue gas from prior exam. Residual skin and soft tissue irregularity persists. 3. Prior resection of the fifth ray. Electronically Signed   By: Andrea Gasman M.D.   On: 06/19/2024 19:53    Scheduled Meds:  acetaminophen   975 mg Oral TID   amoxicillin -clavulanate  1 tablet Oral Q12H   budesonide -glycopyrrolate -formoterol   2 puff Inhalation BID   vitamin B-12  500 mcg Oral Daily   enoxaparin  (LOVENOX ) injection  40 mg Subcutaneous Daily   feeding supplement (GLUCERNA SHAKE)  237 mL Oral BID BM   furosemide   20 mg Intravenous BID   influenza vac split trivalent PF  0.5 mL Intramuscular Tomorrow-1000   insulin  aspart  0-5 Units Subcutaneous QHS   insulin  aspart  0-9 Units Subcutaneous TID WC   insulin  aspart  8 Units Subcutaneous TID WC   insulin  glargine  25 Units Subcutaneous Daily   linezolid   600 mg Oral Q12H   magnesium  oxide  400 mg Oral BID   multivitamin with minerals  1 tablet Oral Daily   nitroGLYCERIN   0.2 mg Transdermal Daily   nutrition supplement (JUVEN)  1 packet Oral BID WC   pantoprazole   40 mg Oral Daily   Continuous Infusions:   LOS: 17 days   Norval Bar, MD  [facility]  06/21/2024, 2:54 PM   "

## 2024-06-21 NOTE — Telephone Encounter (Signed)
 Patient scheduled.

## 2024-06-21 NOTE — Progress Notes (Signed)
" °   06/20/24 2300  BiPAP/CPAP/SIPAP  $ Non-Invasive Home Ventilator  Initial  $ Face Mask Medium Yes  BiPAP/CPAP/SIPAP Pt Type Adult  BiPAP/CPAP/SIPAP Resmed  Reason BIPAP/CPAP not in use Non-compliant (Pt removed after 2 minutes stating she could not tolerate)  Mask Type Nasal mask  Dentures removed? Not applicable  Mask Size Medium  Respiratory Rate 17 breaths/min  EPAP 5 cmH2O  Flow Rate 3 lpm  Patient Home Machine No  Patient Home Mask No  Patient Home Tubing No  Auto Titrate No  CPAP/SIPAP surface wiped down Yes  Device Plugged into RED Power Outlet Yes    "

## 2024-06-21 NOTE — Progress Notes (Signed)
 "  NAME:  Bianca Yang, MRN:  [id number], DOB:  04-07-1976, LOS: 17 ADMISSION DATE:  06/04/2024, CONSULTATION DATE:  06/21/2024 REFERRING MD:  Cosette Blackwater, MD, CHIEF COMPLAINT:  respiratory failure   History of Present Illness:  Bianca Yang is a 48 y.o. woman with past medical history of type 2 diabetes and chronic diabetic foot wounds, OSA not on CPAP, and tobacco use disorder smoking most recently a month ago.  She is here after having a recent fifth ray amputation with Dr. Harden.  She did not take her antibiotics.  She did not have her sutures removed.  She presents with fevers worsening pain and drainage from the ulcers.  She has acute osteomyelitis of the left foot.  She is on IV antibiotics.  She is having fevers.  This morning she was more encephalopathic and hypoxemic.  Chest x-ray is obtained which shows multifocal pneumonia.  She was started on azithromycin  in addition to the Unasyn  she is already on for osteomyelitis.  PCCM was consulted for increased oxygen apartment.  On my exam the patient is somnolent, snoring, she arouses and I was able to wean her down from 10LNC to [facility]. History if very limited due to her lack of cooperation with the interview.    PCCM signed off after initial consult 12/11  Called back 12/23 to eval for persistent hypoxemia   Pertinent  Medical History  Type 2 DM  History of tobacco use GERD  Significant Hospital Events: Including procedures, antibiotic start and stop dates in addition to other pertinent events     Interim History / Subjective:   Did not tolerate CPAP overnight  Objective    Blood pressure 102/62, pulse 79, temperature 98.8 F (37.1 C), temperature source Oral, resp. rate 18, height 5' 7 (1.702 m), weight 67 kg, SpO2 95%.        Intake/Output Summary (Last 24 hours) at 06/21/2024 1021 Last data filed at 06/21/2024 0700 Gross per 24 hour  Intake 600 ml  Output --  Net 600 ml   Filed Weights   06/04/24 1252   Weight: 67 kg   Physical Exam: General: Well-appearing, no acute distress HENT: [place], AT Eyes: EOMI, no scleral icterus Respiratory: Diminished to auscultation bilaterally.  No crackles, wheezing or rales Cardiovascular: RRR, -M/R/G, no JVD Extremities: Left foot wrapped, -edema,-tenderness Neuro: AAO x4, CNII-XII grossly intact Psych: Normal mood, normal affect  Resolved problem list   Assessment and Plan   48 year old female former smoker (quit 1 month ago) with OSA not on CPAP, DM2 c/b diabetic foot ulcer who presents after recent 5th ray amputation with fevers and wound drainage after nonadherence with antibiotics and admitted for acute osteomyelitis of the left foot. PCCM was consulted on 06/08/24 for increased O2 requirement to 10L [place] and CXR concerning for multifocal pneumonia. Evaluation concerning for hypoventilation related to untreated OSA as well as pneumonia. CTA 06/14/24 neg for PE but with bilateral patchy infiltrates/consolidation and bands of atelectasis consistent with pneumonia. CXR 12/23 with subsegmental atelectasis. Echo with EF 45-50%, no WMA . PCCM re-consulted for persistent hypoxemia despite daily diuresis, mobility and prolonged antibiotics for coverage of OM but also should cover pulmonary infection. Patient on ambulatory 4L with ambulation. Currently on triple nebulizers ABG with mild chronic hypercarbia.   Acute vs chronic hypoxemic and hypercarbic respiratory failure  Tobacco history >30 years x 1 ppd. Suspected COPD Hx smoking crack  OSA - patient denies any formal sleep study Suspect chronic hypoxemia as  she has probable underlying chronic lung disease and untreated OSA. Will need close pulmonary follow-up.   -Recommend transitioning from triple nebulizers to MDI to improve adherence  Please discharge on Symbicort  160-4.5 and Spiriva. Inhalers covered by Medicaid -Counseled on potential benefit of CPAP nightly. However patient did not tolerate after 2  min. -Counseled on compliance with oxygen -Will need outpatient pulmonary evaluation with PFT and home sleep study -May consider outpatient RHC if work-up negative  -Encourage tobacco cessation -Outpatient pulmonary follow-up scheduled on 07/06/24 at 10:30 AM  Pulmonary available as needed  MDM Mod Care Time: 35 min  Slater Staff, M.D. Brittni [facility] 06/21/2024 10:21 AM   See Amion for personal pager For hours between 7 PM to 7 AM, please call Elink for urgent questions          "

## 2024-06-22 DIAGNOSIS — M86172 Other acute osteomyelitis, left ankle and foot: Secondary | ICD-10-CM | POA: Diagnosis not present

## 2024-06-22 LAB — GLUCOSE, CAPILLARY
Glucose-Capillary: 255 mg/dL — ABNORMAL HIGH (ref 70–99)
Glucose-Capillary: 172 mg/dL — ABNORMAL HIGH (ref 70–99)
Glucose-Capillary: 171 mg/dL — ABNORMAL HIGH (ref 70–99)
Glucose-Capillary: 265 mg/dL — ABNORMAL HIGH (ref 70–99)

## 2024-06-22 LAB — CREATININE, SERUM
Creatinine, Ser: 0.6 mg/dL (ref 0.44–1.00)
GFR, Estimated: >60 mL/min

## 2024-06-22 MED ORDER — MELATONIN 5 MG PO TABS
5.0000 mg | ORAL_TABLET | Freq: Every evening | ORAL | Status: AC | PRN
  Administered 2024-06-22: 5 mg via ORAL
  Filled 2024-06-22: qty 1

## 2024-06-22 MED ORDER — ALUM & MAG HYDROXIDE-SIMETH 200-200-20 MG/5ML PO SUSP
15.0000 mL | Freq: Two times a day (BID) | ORAL | Status: DC | PRN
  Administered 2024-06-22: 15 mL via ORAL
  Filled 2024-06-22: qty 30

## 2024-06-22 NOTE — Progress Notes (Signed)
 OT Cancellation Note  Patient Details Name: Bianca Yang MRN: [id number] DOB: September 07, 1975   Cancelled Treatment:    Reason Eval/Treat Not Completed: Patient declined, no reason specified (OT attempted patient x3 today with patient asking OT to come back on first 2 attempts and on third attempt patient states she is very tired and would like to rest today.)  Vicent Febles LITTIE Laine 06/22/2024, 11:25 AM  Dick Laine, OTA Acute Rehabilitation Services  Office [phone]

## 2024-06-22 NOTE — Progress Notes (Signed)
 " PROGRESS NOTE    Bianca Yang  FMW:[id number] DOB: 1976-02-15 DOA: 06/04/2024 PCP: Patient, [name] Per  Subjective: No acute events overnight. As per nursing, having 3-4 loose Bms per day without fevers. Seen and examined at bedside. No new complaints. Denies nausea, vomiting.    Hospital Course: Bianca Yang is a 48 y.o. female with past medical history significant of DM2, diabetic foot ulcer, OSA, PAD presented to hospital with left foot pain and swelling.  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  In the ED patient was afebrile.  Labs were notable for mild leukocytosis but lactate was within normal range.  BMP showed mild hyponatremia with sodium of 131 with a glucose elevation at 483.  ESR was elevated at 41 with CRP of 2.9.  X-ray of the left foot showed large lateral midfoot ulceration with lytic destruction of the cuboid and proximal base of the fourth metatarsal suspicious osteomyelitis.  CT scan of the foot was then performed which showed acute osteomyelitis involving the fourth metatarsal and cuboid.  Patient was then considered for admission to the hospital for further evaluation and treatment Patient continued on IV antibiotics.  Also noted to be hypercapnic and hypoxic needing HFNC 12/11: Seen by PCCM       Assessment and Plan: No notes have been filed under this hospital service. Service: Hospitalist  Acute on chronic hypoxic hypercapnic respiratory failure  - waxing and waning course - VBG 12/7 showed pH 7.36, pCO2 68.6, bicarb 39.1 - Had escalating O2 requirement, up to 15L HFNC, which has since been weaned down to [place] - ABG 12/11 showed pH 7.38, pO2 60, pCO2 68, bicarb 40.2 - PCCM evaluated patient on 12/11, indicated hypoxemia likely in part caused by untreated OSA and somnolence leading to hypoventilation - CTA (12/17) was negative for PE,  showed mild patchy airspace consolidation in bilateral lower lobes concerning for pneumonia - CTA 12/17 negative for PE - BLE duplex 12/8 negative for DVT  - Continue Brovana , Pulmicort , and Yupelri  nebs - Patient's O2 requirement continue to fluctuate, still requirign 4.5L  - TOC informed that patient may require 5-6L/min [place] on discharge requiring the 10L concentrator  - hoping to aim for an O2 requirement of 2-3 L/min for discharge home but I am becoming less confident that the patient will sustain that any time soon.  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 Harden, noted no surgical intervention, continue nonweightbearing, continue Vashe dressing changes at discharge - Patient reported bearing weight on left foot while in the bathroom, feeling a pop and having some medial discomfort, requesting imaging - Left foot xray (12/22) showed progressive cortical erosive/destructive changes of the 4th metatarsal base and lateral cuboid consistent with osteomyelitis, with decreased soft tissue gas and no new bony destruction status post 5th ray resection - Received approximately 9 days of IV vancomycin  and Unasyn  then switched to p.o. 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 - TTE (12/11) showed mildly reduced LV systolic function (EF 45 to 50%) with  abnormal septal motion/inferior basal hypokinesis, mildly enlarged RV with mildly reduced function, trivial MR, dilated IVC with >50% collapse consistent with RAP 8 mmHg - Appears euvolemic now - cont PO Lasix  20 mg BID - Outpatient follow up with cardiology   Uncontrolled T2DM - Hgb A1c 11.6 (05/04/2024) - Holding home metformin  and glimepiride  - Increased Lantus  to 25 units, continue Novolog  8 units TID premeal, SSI - DM educator following - Will be discharged on insulin    Normocytic anemia - 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 inflammation  - Hgb relatively stable but down from recent baseline of 12-13   Enlarged thyroid gland with left nodule - CTA chest showed incidental finding of enlarged and heterogeneous thyroid gland with a left thyroid nodule measuring 3.6 x 2.4 cm increased in size - Recommended follow-up with nonemergent thyroid ultrasound   Polysubstance abuse - UDS positive for cocaine and opiates  - Counseled on need for cessation   History of tobacco abuse - Recently quit less than 1 month ago, previously smoking less than a pack/day for 28 years  DVT prophylaxis: enoxaparin  (LOVENOX ) injection 40 mg Start: 06/08/24 1100 SCDs Start: 06/08/24 1046 SCDs Start: 06/04/24 1922  Lovenox    Code Status: Full Code  Disposition Plan: Home with family assistance Reason for continuing need for hospitalization:  severity of illness, wean oxygen requirements   Objective: Vitals:   06/21/24 1456 06/22/24 0334 06/22/24 0721 06/22/24 0855  BP: (!) 115/58 116/69 123/67   Pulse: 80 86 81 78  Resp: 18 17 16 17   Temp: 98.7 F (37.1 C) 97.9 F (36.6 C) 99.1 F (37.3 C)   TempSrc: Oral Oral    SpO2: 95% 93% 92% 95%  Weight:      Height:        Intake/Output Summary (Last 24 hours) at 06/22/2024 1156 Last data filed at 06/21/2024 1700 Gross per 24 hour  Intake 240 ml  Output --  Net 240 ml   Filed Weights   06/04/24 1252  Weight: 67 kg    Examination:  Physical Exam Vitals  and nursing note reviewed.  Constitutional:      General: She is not in acute distress.    Appearance: She is ill-appearing.     Comments: frail  HENT:     Head: Normocephalic and atraumatic.  Cardiovascular:     Rate and Rhythm: Normal rate and regular rhythm.     Pulses: Normal pulses.     Heart sounds: Normal heart sounds.  Pulmonary:     Effort: Pulmonary effort is normal.     Breath sounds: Normal breath sounds.  Abdominal:     General: Bowel sounds are normal.     Palpations: Abdomen is soft.  Neurological:     Mental Status: She is alert.     Data Reviewed: I have personally reviewed following labs and imaging studies  CBC: Recent Labs  Lab 06/17/24 1204 06/18/24 0556 06/19/24 0325 06/20/24 0432  WBC 12.7* 10.3 9.1 9.0  HGB 10.8* 10.4* 9.8* 9.8*  HCT 36.3 35.1* 33.5* 33.7*  MCV 86.6 87.5 88.4 89.6  PLT 420* 387 354 324   Basic Metabolic Panel: Recent Labs  Lab 06/17/24 1204 06/18/24 0556 06/19/24 0325 06/20/24 0432 06/21/24  0831 06/22/24 0631  NA 137 134* 139 137 138  --   K 4.0 4.4 4.7 4.5 4.4  --   CL 94* 95* 94* 95* 92*  --   CO2 35* 31 36* 37* 39*  --   GLUCOSE 131* 319* 222* 263* 111*  --   BUN 30* 34* 42* 30* 37*  --   CREATININE 0.62 0.63 0.68 0.60 0.71 0.60  CALCIUM  9.5 9.0 9.2 9.3 9.8  --    GFR: Estimated Creatinine Clearance: 82.7 mL/min (by C-G formula based on SCr of 0.6 mg/dL). Liver Function Tests: No results for input(s): AST, ALT, ALKPHOS, BILITOT, PROT, ALBUMIN in the last 168 hours. No results for input(s): LIPASE, AMYLASE in the last 168 hours. No results for input(s): AMMONIA in the last 168 hours. Coagulation Profile: No results for input(s): INR, PROTIME in the last 168 hours. Cardiac Enzymes: No results for input(s): CKTOTAL, CKMB, CKMBINDEX, TROPONINI in the last 168 hours. ProBNP, BNP (last 5 results) Recent Labs    06/08/24 0218  BNP 208.7*   HbA1C: No results for input(s): HGBA1C  in the last 72 hours. CBG: Recent Labs  Lab 06/21/24 1119 06/21/24 1612 06/21/24 2135 06/22/24 0608 06/22/24 1129  GLUCAP 197* 289* 194* 255* 172*   Lipid Profile: No results for input(s): CHOL, HDL, LDLCALC, TRIG, CHOLHDL, LDLDIRECT in the last 72 hours. Thyroid Function Tests: No results for input(s): TSH, T4TOTAL, FREET4, T3FREE, THYROIDAB in the last 72 hours. Anemia Panel: No results for input(s): VITAMINB12, FOLATE, FERRITIN, TIBC, IRON, RETICCTPCT in the last 72 hours. Sepsis Labs: No results for input(s): PROCALCITON, LATICACIDVEN in the last 168 hours.  No results found for this or any previous visit (from the past 240 hours).   Radiology Studies: DG CHEST PORT 1 VIEW Result Date: 06/20/2024 EXAM: 1 VIEW(S) XRAY OF THE CHEST 06/20/2024 02:24:00 PM COMPARISON: 06/13/2024 CLINICAL HISTORY: Hypoxia FINDINGS: LUNGS AND PLEURA: Low lung volumes. Trace bilateral pleural effusions, decreased on right and similar on left. Diffuse interstitial prominence and peribronchial cuffing, stable. Increased linear opacities in the bilateral lower lungs. No pneumothorax. HEART AND MEDIASTINUM: Borderline cardiomegaly. BONES AND SOFT TISSUES: No acute osseous abnormality. IMPRESSION: 1. Increased  basilar atelectasis or pneumonia. 2. Similar peribronchial and interstitial coarsening suggesting edema. 3. Trace bilateral pleural effusions, decreased on the right and similar on the left. Electronically signed by: Norman Gatlin MD 06/20/2024 11:01 PM EST RP Workstation: [id number]    Scheduled Meds:  acetaminophen   975 mg Oral TID   amoxicillin -clavulanate  1 tablet Oral Q12H   budesonide -glycopyrrolate -formoterol   2 puff Inhalation BID   vitamin B-12  500 mcg Oral Daily   enoxaparin  (LOVENOX ) injection  40 mg Subcutaneous Daily   feeding supplement (GLUCERNA SHAKE)  237 mL Oral BID BM   furosemide   20 mg Oral BID   influenza vac split trivalent PF  0.5 mL  Intramuscular Tomorrow-1000   insulin  aspart  0-5 Units Subcutaneous QHS   insulin  aspart  0-9 Units Subcutaneous TID WC   insulin  aspart  8 Units Subcutaneous TID WC   insulin  glargine  25 Units Subcutaneous Daily   linezolid   600 mg Oral Q12H   magnesium  oxide  400 mg Oral BID   multivitamin with minerals  1 tablet Oral Daily   nitroGLYCERIN   0.2 mg Transdermal Daily   nutrition supplement (JUVEN)  1 packet Oral BID WC   pantoprazole   40 mg Oral Daily   Continuous Infusions:   LOS: 18 days   Norval Bar,  MD  [facility]  06/22/2024, 11:56 AM   "

## 2024-06-23 ENCOUNTER — Other Ambulatory Visit (HOSPITAL_COMMUNITY): Payer: Self-pay

## 2024-06-23 ENCOUNTER — Telehealth (HOSPITAL_COMMUNITY): Payer: Self-pay

## 2024-06-23 ENCOUNTER — Other Ambulatory Visit: Payer: Self-pay

## 2024-06-23 DIAGNOSIS — M86172 Other acute osteomyelitis, left ankle and foot: Secondary | ICD-10-CM | POA: Diagnosis not present

## 2024-06-23 DIAGNOSIS — E118 Type 2 diabetes mellitus with unspecified complications: Secondary | ICD-10-CM

## 2024-06-23 LAB — GLUCOSE, CAPILLARY
Glucose-Capillary: 193 mg/dL — ABNORMAL HIGH (ref 70–99)
Glucose-Capillary: 194 mg/dL — ABNORMAL HIGH (ref 70–99)

## 2024-06-23 MED ORDER — FUROSEMIDE 20 MG PO TABS
20.0000 mg | ORAL_TABLET | Freq: Two times a day (BID) | ORAL | 0 refills | Status: DC
  Filled 2024-06-23: qty 30, 15d supply, fill #0

## 2024-06-23 MED ORDER — ADULT MULTIVITAMIN W/MINERALS CH
1.0000 | ORAL_TABLET | Freq: Every day | ORAL | 0 refills | Status: AC
  Filled 2024-06-23: qty 30, 30d supply, fill #0

## 2024-06-23 MED ORDER — LANCET DEVICE MISC
1.0000 | 0 refills | Status: AC
  Filled 2024-06-23: qty 1, fill #0

## 2024-06-23 MED ORDER — MAGNESIUM OXIDE -MG SUPPLEMENT 400 (240 MG) MG PO TABS
400.0000 mg | ORAL_TABLET | Freq: Two times a day (BID) | ORAL | 0 refills | Status: DC
  Filled 2024-06-23: qty 60, 30d supply, fill #0

## 2024-06-23 MED ORDER — LINEZOLID 600 MG PO TABS
600.0000 mg | ORAL_TABLET | Freq: Two times a day (BID) | ORAL | 0 refills | Status: AC
  Filled 2024-06-23: qty 46, 23d supply, fill #0

## 2024-06-23 MED ORDER — OXYCODONE HCL 5 MG PO TABS
5.0000 mg | ORAL_TABLET | ORAL | 0 refills | Status: AC | PRN
  Filled 2024-06-23: qty 10, 2d supply, fill #0

## 2024-06-23 MED ORDER — PANTOPRAZOLE SODIUM 40 MG PO TBEC
40.0000 mg | DELAYED_RELEASE_TABLET | Freq: Every day | ORAL | 0 refills | Status: DC
  Filled 2024-06-23: qty 30, 30d supply, fill #0

## 2024-06-23 MED ORDER — GVOKE HYPOPEN 2-PACK 1 MG/0.2ML ~~LOC~~ SOAJ
1.0000 mg | SUBCUTANEOUS | 0 refills | Status: AC | PRN
  Filled 2024-06-23: qty 0.4, 2d supply, fill #0
  Filled 2024-06-23: qty 0.2, 30d supply, fill #0

## 2024-06-23 MED ORDER — INSULIN PEN NEEDLE 32G X 4 MM MISC
1.0000 | 0 refills | Status: AC
  Filled 2024-06-23: qty 100, 25d supply, fill #0

## 2024-06-23 MED ORDER — BASAGLAR KWIKPEN 100 UNIT/ML ~~LOC~~ SOPN
25.0000 [IU] | PEN_INJECTOR | Freq: Every day | SUBCUTANEOUS | 0 refills | Status: AC
  Filled 2024-06-23: qty 21, 84d supply, fill #0

## 2024-06-23 MED ORDER — GLIMEPIRIDE 2 MG PO TABS
2.0000 mg | ORAL_TABLET | Freq: Every day | ORAL | 0 refills | Status: DC
  Filled 2024-06-23: qty 30, 30d supply, fill #0

## 2024-06-23 MED ORDER — BLOOD GLUCOSE TEST VI STRP
1.0000 | ORAL_STRIP | 0 refills | Status: AC
  Filled 2024-06-23: qty 100, 25d supply, fill #0

## 2024-06-23 MED ORDER — MOMETASONE FURO-FORMOTEROL FUM 100-5 MCG/ACT IN AERO
2.0000 | INHALATION_SPRAY | Freq: Two times a day (BID) | RESPIRATORY_TRACT | 0 refills | Status: AC
  Filled 2024-06-23: qty 13, 30d supply, fill #0

## 2024-06-23 MED ORDER — BLOOD GLUCOSE MONITOR SYSTEM W/DEVICE KIT
1.0000 | PACK | 0 refills | Status: AC
  Filled 2024-06-23 (×2): qty 1, 30d supply, fill #0

## 2024-06-23 MED ORDER — VITAMIN B-12 1000 MCG PO TABS
500.0000 ug | ORAL_TABLET | Freq: Every day | ORAL | 0 refills | Status: DC
  Filled 2024-06-23: qty 30, 60d supply, fill #0

## 2024-06-23 MED ORDER — AMOXICILLIN-POT CLAVULANATE 875-125 MG PO TABS
1.0000 | ORAL_TABLET | Freq: Two times a day (BID) | ORAL | 0 refills | Status: AC
  Filled 2024-06-23: qty 46, 23d supply, fill #0

## 2024-06-23 MED ORDER — METFORMIN HCL 1000 MG PO TABS
1000.0000 mg | ORAL_TABLET | Freq: Two times a day (BID) | ORAL | 0 refills | Status: DC
  Filled 2024-06-23: qty 60, 30d supply, fill #0

## 2024-06-23 MED ORDER — ACCU-CHEK SOFTCLIX LANCETS MISC
1.0000 | 0 refills | Status: AC
  Filled 2024-06-23: qty 100, 25d supply, fill #0

## 2024-06-23 MED ORDER — INCRUSE ELLIPTA 62.5 MCG/ACT IN AEPB
1.0000 | INHALATION_SPRAY | Freq: Every day | RESPIRATORY_TRACT | 0 refills | Status: AC
  Filled 2024-06-23: qty 30, 30d supply, fill #0
  Filled 2024-07-21: qty 30, 30d supply, fill #1

## 2024-06-23 MED ORDER — FUROSEMIDE 20 MG PO TABS
20.0000 mg | ORAL_TABLET | Freq: Two times a day (BID) | ORAL | 0 refills | Status: DC
  Filled 2024-06-23: qty 60, 30d supply, fill #0

## 2024-06-23 NOTE — Progress Notes (Signed)
 Occupational Therapy Treatment Patient Details Name: Bianca Yang MRN: [id number] DOB: October 24, 1975 Today's Date: 06/23/2024   History of present illness Pt is a 48 y.o. F presenting to [facility] on 06/04/24 with L foot pain and swelling following L foot 5th ray amputation on 05/05/24. Dehisence of wound. IMG done on 12/11 suggesting multifocal PNA. Stitches removed on 12/17. PMH is significant for DM, cocaine abuse.   OT comments  Pt is making good progress towards their acute OT goals. Pt reported feeling well today with plans to discharge home with her dad. Session focused on ADL review with NWB and O2 cord mgmt. Pt receptive and appreciative. Pt reported concern about accurate medication mgmt - OT suggested pill box with her dads help organizing it weekly. OT to continue to follow acutely to facilitate progress towards established goals. Pt will continue to benefit from [place].       If plan is discharge home, recommend the following:  A little help with walking and/or transfers;A little help with bathing/dressing/bathroom;Assistance with cooking/housework;Assist for transportation;Help with stairs or ramp for entrance   Equipment Recommendations  Wheelchair (measurements OT);Wheelchair cushion (measurements OT);BSC/3in1       Precautions / Restrictions Precautions Precautions: Fall Recall of Precautions/Restrictions: Impaired Precaution/Restrictions Comments: impulsive, watch O2 does not use at baseline Restrictions Weight Bearing Restrictions Per Provider Order: Yes LLE Weight Bearing Per Provider Order: Non weight bearing       Mobility Bed Mobility Overal bed mobility: Modified Independent                  Transfers Overall transfer level: Needs assistance Equipment used: Rolling walker (2 wheels), None Transfers: Sit to/from Stand, Bed to chair/wheelchair/BSC Sit to Stand: Supervision           General transfer comment: continues to be impulsive but maintains  NWB well     Balance Overall balance assessment: Needs assistance Sitting-balance support: No upper extremity supported, Feet supported Sitting balance-Leahy Scale: Good     Standing balance support: Single extremity supported, During functional activity Standing balance-Leahy Scale: Poor Standing balance comment: statically                           ADL either performed or assessed with clinical judgement   ADL Overall ADL's : Needs assistance/impaired                                   Tub/Shower Transfer Details (indicate cue type and reason): educated pt to ask surgical team when she is allow to shower - reviewed: wear OT in the shower, sit to bathe, use shower chair and hand held shower head Functional mobility during ADLs: Supervision/safety;Rolling walker (2 wheels) General ADL Comments: pt continues to be impulsive but maintains NWB well    Extremity/Trunk Assessment Upper Extremity Assessment Upper Extremity Assessment: Overall WFL for tasks assessed   Lower Extremity Assessment Lower Extremity Assessment: Defer to PT evaluation        Vision   Vision Assessment?: No apparent visual deficits   Perception Perception Perception: Within Functional Limits   Praxis Praxis Praxis: WFL   Communication Communication Communication: No apparent difficulties   Cognition Arousal: Alert Behavior During Therapy: Impulsive Cognition: No family/caregiver present to determine baseline, Cognition impaired             OT - Cognition Comments: session completed reviewing safety with O2 line  mgmt, showering, LB dressing and safety. Pt receptive. Also reviewed medication mgmt - pt agreeable to use pill box and allow her dads help to organize                 Following commands: Intact        Cueing   Cueing Techniques: Verbal cues        General Comments 4L O2 throughout    Pertinent Vitals/ Pain       Pain Assessment Pain  Assessment: Faces Faces Pain Scale: Hurts little more Pain Location: L foot Pain Descriptors / Indicators: Sore Pain Intervention(s): Limited activity within patient's tolerance, Monitored during session   Frequency  Min 2X/week        Progress Toward Goals  OT Goals(current goals can now be found in the care plan section)  Progress towards OT goals: Progressing toward goals      AM-PAC OT 6 Clicks Daily Activity     Outcome Measure   Help from another person eating meals?: None Help from another person taking care of personal grooming?: None Help from another person toileting, which includes using toliet, bedpan, or urinal?: A Little Help from another person bathing (including washing, rinsing, drying)?: A Little Help from another person to put on and taking off regular upper body clothing?: None Help from another person to put on and taking off regular lower body clothing?: A Little 6 Click Score: 21    End of Session Equipment Utilized During Treatment: Oxygen;Rolling walker (2 wheels)  OT Visit Diagnosis: Unsteadiness on feet (R26.81);Other abnormalities of gait and mobility (R26.89);Other symptoms and signs involving cognitive function   Activity Tolerance Patient tolerated treatment well   Patient Left in bed;with call bell/phone within reach   Nurse Communication Mobility status        Time: 8753-8695 OT Time Calculation (min): 18 min  Charges: OT General Charges $OT Visit: 1 Visit OT Treatments $Self Care/Home Management : 8-22 mins  Bianca Yang, OTR/L Acute Rehabilitation Services Office [phone] Secure Chat Communication Preferred   Bianca Yang 06/23/2024, 1:34 PM

## 2024-06-23 NOTE — TOC Transition Note (Signed)
 Transition of Care [facility]) - Discharge Note   Patient Details  Name: Bianca Yang MRN: [id number] Date of Birth: 1976/02/11  Transition of Care [facility] CM/SW Contact:  Rosalva Jon Bloch, RN Phone Number: 06/23/2024, 3:55 PM   Clinical Narrative:    Patient will DC un:Ynfz Anticipated DC date: 06/23/2024 Family notified: yes Transport by: car  Presented with L foot pain and swelling.  Per MD patient ready for DC today. RN, patient, and patient's family ( father/sister), notified of DC. Pt agreeable to home health services without provider preference. [facility] accepted to provide HHPT services. [facility] to assist with adding other disciplines if needed. DME: oxygen, BSC and w/c to go home with pt @ bedside Pt without RX med concerns. Post hospital f/u noted on AVS. Family to provide transportation to home. Pt provided with Medicaid transportation # to assist with provider appointments, [phone].  RNCM will sign off for now as intervention is no longer needed. Please consult us  again if new needs arise.    Final next level of care: Home w Home Health Services Barriers to Discharge: No Barriers Identified   Patient Goals and CMS Choice Patient states their goals for this hospitalization and ongoing recovery are:: To go to rehab CMS Medicare.gov Compare Post Acute Care list provided to:: Patient Choice offered to / list presented to : Patient [place] ownership interest in [facility].provided to:: Patient    Discharge Placement                       Discharge Plan and Services Additional resources added to the After Visit Summary for   In-house Referral: Clinical Social Work                        HH Arranged: PT [facility] Agency: [facility] Date HH Agency Contacted: 06/23/24 Time HH Agency Contacted: 1455 Representative spoke with at [facility] Agency: [facility]  Social Drivers of Health (SDOH) Interventions SDOH Screenings    Food Insecurity: Food Insecurity Present (06/04/2024)  Housing: High Risk (06/04/2024)  Transportation Needs: Unmet Transportation Needs (06/04/2024)  Utilities: At Risk (06/04/2024)  Tobacco Use: Medium Risk (06/04/2024)     Readmission Risk Interventions     No data to display

## 2024-06-23 NOTE — Progress Notes (Addendum)
 Patient discharged to home, AVS reviewed. Educated patient on the importance of adhering to medications, controlling blood sugar, proper wound care, and follow up appointments. Patient is agreeable. Provided wound care education and supplies. TOC to provide prescriptions.  Provided education on insulin , hypoglycemia and carb modified diet. IV's removed, telemetry discontinued, and DME at the bedside.   Patient declined flu vaccine, encouraged to discuss with PCP

## 2024-06-23 NOTE — Discharge Summary (Addendum)
 " [facility] Phys[facility] Discharge Summary   Patient name: Bianca Yang  Admit date:     06/04/2024  Discharge date: 06/23/2024  Attending Physician: DOUTOVA, ANASTASSIA [[id number]]  Discharge Physician: Norval Bar   PCP: Patient, [name] Per  Admitted From: Home  Disposition:  Home  Recommendations for Outpatient Follow-up:  Follow up with PCP in 1-2 weeks Follow up lab work in one wee Ensure follow up with pulmonology as scheduled on 07/06/24 Ensure follow up with cardiology within 2 weeks  Home Health:Yes Equipment/Devices: @ECDMELIST @  Discharge Condition:Stable CODE STATUS:FULL Diet recommendation: Heart Healthy/Diabetic Fluid Restriction: None  Hospital Summary: DERETHA ERTLE is a 48 y.o. female with past medical history significant of DM2, diabetic foot ulcer, OSA, PAD presented to hospital with left foot pain and swelling.  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  In the ED patient was afebrile.  Labs were notable for mild leukocytosis but lactate was within normal range.  BMP showed mild hyponatremia with sodium of 131 with a glucose elevation at 483.  ESR was elevated at 41 with CRP of 2.9.  X-ray of the left foot showed large lateral midfoot ulceration with lytic destruction of the cuboid and proximal base of the fourth metatarsal suspicious osteomyelitis.  CT scan of the foot was then performed which showed acute osteomyelitis involving the fourth metatarsal and cuboid.  Patient was then considered for admission to the hospital for further evaluation and treatment Patient continued on IV antibiotics.  Also noted to be hypercapnic and hypoxic needing HFNC 12/11: Seen by [facility] Course by Problem:  Acute on chronic hypoxic hypercapnic respiratory failure  VBG 12/7 showed pH 7.36, pCO2 68.6, bicarb 39.1\. Had escalating O2  requirement, up to 15L HFNC, which has since been weaned down to [place]. ABG 12/11 showed pH 7.38, pO2 60, pCO2 68, bicarb 40.2. PCCM evaluated patient on 12/11, indicated hypoxemia likely in part caused by untreated OSA and somnolence leading to hypoventilation. CTA (12/17) was negative for PE, showed mild patchy airspace consolidation in bilateral lower lobes concerning for pneumonia. CTA 12/17 negative for PE. BLE duplex 12/8 negative for DVT. Unable to O2 requirement to 2-3 L/min for discharge home.  - Patient's O2 requirement continue to fluctuate, still requiring 4.5L. 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 Harden, noted no surgical intervention, continue nonweightbearing, continue Vashe dressing changes at discharge. Patient reported bearing weight on left foot while in the bathroom, feeling a pop and having some medial discomfort, requesting imaging. Left foot xray (12/22) showed progressive cortical erosive/destructive changes of the 4th metatarsal base and lateral cuboid consistent with osteomyelitis, with decreased soft tissue gas and no new bony destruction status post 5th ray resection. Received approximately 9 days of IV vancomycin  and Unasyn  - continue p.o. linezolid  and Augmentin  on 06/14/2024 by ID for 6 weeks  - outpatient follow-up with ID clinic, referral given   Multifocal pneumonia CTA 12/17 with mild patchy airspace consolidation in bilateral lower lobes concerning for pneumonia. 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 TTE (12/11) showed mildly reduced  LV systolic function (EF 45 to 50%) with abnormal septal motion/inferior basal hypokinesis, mildly enlarged RV with mildly reduced function, trivial MR, dilated IVC with >50% collapse consistent with RAP 8 mmHg - Appears euvolemic now - cont PO Lasix  20 mg BID - Outpatient follow up with cardiology, referral given   Uncontrolled T2DM Hgb A1c 11.6 (05/04/2024). Seen by diabetes educator - discontinued home glimepiride  as started on insuling - cont home metformin  - started Lantus  25 units - follow up with PCP within one week, referral given   Normocytic anemia 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 inflammation. Hgb relatively stable but down from recent baseline of 12-13 - monitor CBC as needed   Enlarged thyroid gland with left nodule CTA chest showed incidental finding of enlarged and heterogeneous thyroid gland with a left thyroid nodule measuring 3.6 x 2.4 cm increased in size - Recommended follow-up with nonemergent thyroid ultrasound   Polysubstance abuse UDS positive for cocaine and opiates. Counseled on need for cessation   History of tobacco abuse Recently quit less than 1 month ago, previously smoking less than a pack/day for 28 years  Discharge Diagnoses:  Principal Problem:   Osteomyelitis of left foot (HCC) Active Problems:   Cocaine abuse (HCC)   Diabetic foot infection (HCC)   Hypercapnia   Acute osteomyelitis of metatarsal bone of left foot (HCC)   Cellulitis and abscess of toe of left foot   Discharge Instructions  Discharge Instructions     Ambulatory referral to Cardiology   Complete by: As directed    Ambulatory referral to Infectious Disease   Complete by: As directed    Change dressing   Complete by: As directed    Wash left foot with soap and water daily.  Apply Vashe to a 4 x 4 gauze applied to the wound and wrapped with an Ace wrap.  Minimize weightbearing on the left foot.   Diet Carb Modified   Complete by: As directed    Heart Healthy   Discharge wound care:    Complete by: As directed    Apply 4 x 4 gauze dampened with Vashe to the left foot wound secure with an Ace wrap change daily.   For home use only DME oxygen   Complete by: As directed    Length of Need: Lifetime   Mode or (Route): Nasal cannula   Liters per Minute: 5   Frequency: Continuous (stationary and portable oxygen unit needed)   Oxygen conserving device: Yes   Oxygen delivery system:  Gas Concentrator     Increase activity slowly   Complete by: As directed       Allergies as of 06/23/2024       Reactions   Sulfa Antibiotics Other (See Comments)   Unknown/ childhood allergy.         Medication List     STOP taking these medications    Accu-Chek Guide w/Device Kit Replaced by: Blood Glucose Monitoring Suppl Devi   glimepiride  2 MG tablet Commonly known as: AMARYL        TAKE these medications    Acetaminophen  Extra Strength 500 MG Tabs Take 2 tablets (1,000 mg total) by mouth every 6 (six) hours.   amoxicillin -clavulanate 875-125 MG tablet Commonly known as: AUGMENTIN  Take 1 tablet by mouth every 12 (twelve) hours for 23 days. What changed: when to take this   Basaglar  KwikPen 100 UNIT/ML Inject 25  Units into the skin daily. May substitute as needed per insurance.   Blood Glucose Monitoring Suppl Devi 1 each by Does not apply route as directed. Dispense based on patient and insurance preference. Use up to four times daily as directed. (FOR ICD-10 E10.9, E11.9). Replaces: Accu-Chek Guide w/Device Kit   BLOOD GLUCOSE TEST STRIPS Strp 1 each by Does not apply route as directed. Dispense based on patient and insurance preference. Use up to four times daily as directed. (FOR ICD-10 E10.9, E11.9).   cyanocobalamin  500 MCG tablet Commonly known as: VITAMIN B12 Take 1 tablet (500 mcg total) by mouth daily. Start taking on: June 24, 2024   furosemide  20 MG tablet Commonly known as: LASIX  Take 1 tablet (20 mg total) by mouth 2 (two) times daily.    Gvoke HypoPen  2-Pack 1 MG/0.2ML Soaj Generic drug: Glucagon  Inject 1 mg into the skin as needed for up to 2 doses (Severe low blood sugar).   Incruse Ellipta  62.5 MCG/ACT Aepb Generic drug: umeclidinium bromide  Inhale 1 puff into the lungs daily.   Lancet Device Misc 1 each by Does not apply route as directed. Dispense based on patient and insurance preference. Use up to four times daily as directed. (FOR ICD-10 E10.9, E11.9).   Lancets Misc 1 each by Does not apply route as directed. Dispense based on patient and insurance preference. Use up to four times daily as directed. (FOR ICD-10 E10.9, E11.9). What changed: how to take this   linezolid  600 MG tablet Commonly known as: ZYVOX  Take 1 tablet (600 mg total) by mouth every 12 (twelve) hours for 23 days.   magnesium  oxide 400 (240 Mg) MG tablet Commonly known as: MAG-OX Take 1 tablet (400 mg total) by mouth 2 (two) times daily.   metFORMIN  1000 MG tablet Commonly known as: GLUCOPHAGE  Take 1 tablet (1,000 mg total) by mouth 2 (two) times daily with a meal.   mometasone -formoterol  100-5 MCG/ACT Aero Commonly known as: DULERA  Inhale 2 puffs into the lungs 2 (two) times daily.   multivitamin with minerals Tabs tablet Take 1 tablet by mouth daily. Start taking on: June 24, 2024   naproxen  500 MG tablet Commonly known as: NAPROSYN  Take 1 tablet (500 mg total) by mouth 2 (two) times daily with a meal.   oxyCODONE  5 MG immediate release tablet Commonly known as: Oxy IR/ROXICODONE  Take 1-2 tablets (5-10 mg total) by mouth every 4 (four) hours as needed for severe pain (pain score 7-10).   pantoprazole  40 MG tablet Commonly known as: PROTONIX  Take 1 tablet (40 mg total) by mouth daily. Start taking on: June 24, 2024   Pen Needles 31G X 5 MM Misc 1 each by Does not apply route as directed. Dispense based on patient and insurance preference. Use up to four times daily as directed. (FOR ICD-10 E10.9, E11.9).    polyethylene glycol powder 17 GM/SCOOP powder Commonly known as: GLYCOLAX /MIRALAX  Take 17 g by mouth daily as needed for mild constipation. Dissolve 1 capful (17g) in 4-8 ounces of liquid and take by mouth daily.               Durable Medical Equipment  (From admission, onward)           Start     Ordered   06/20/24 1207  For home use only DME Bedside commode  Once       Question:  Patient needs a bedside commode to treat with the following condition  Answer:  Weakness  06/20/24 1207   06/20/24 1207  For home use only DME lightweight manual wheelchair with seat cushion  Once       Comments: Patient suffers from weakness which impairs their ability to perform daily activities like bathing, dressing, grooming, and toileting in the home.  A cane or walker will not resolve  issue with performing activities of daily living. A wheelchair will allow patient to safely perform daily activities. Patient is not able to propel themselves in the home using a standard weight wheelchair due to general weakness. Patient can self propel in the lightweight wheelchair. Length of need Lifetime. Accessories: elevating leg rests (ELRs), wheel locks, extensions and anti-tippers.   06/20/24 1207   06/20/24 0000  For home use only DME oxygen       Question Answer Comment  Length of Need Lifetime   Mode or (Route) Nasal cannula   Liters per Minute 5   Frequency Continuous (stationary and portable oxygen unit needed)   Oxygen conserving device Yes   Oxygen delivery system: Gas   Oxygen delivery system: Concentrator      06/20/24 1553              Discharge Care Instructions  (From admission, onward)           Start     Ordered   06/23/24 0000  Discharge wound care:       Comments: Apply 4 x 4 gauze dampened with Vashe to the left foot wound secure with an Ace wrap change daily.   06/23/24 1301   06/14/24 0000  Change dressing       Comments: Wash left foot with soap and water  daily.  Apply Vashe to a 4 x 4 gauze applied to the wound and wrapped with an Ace wrap.  Minimize weightbearing on the left foot.   06/14/24 1248            Contact information for follow-up providers     Gari Lauraine BRAVO, FNP Follow up on 06/15/2024.   Specialty: Family Medicine Why: 9:15 for hospital follow and new patient establishment Contact information: [address] [place] [address] [phone]         [facility] (DME) Follow up.   Specialty: DME Services Why: w/chair and bsc, home oxygen Contact information: [address] [place]  [address] [phone]             Contact information for after-discharge care     Home Medical Care     [facility] [place] [facility] [place]) .   Service: Home Health Services Why: Agency will call you to set up apt times Contact information: [address] [place] [address] [facility]   [address] [phone]                    Allergies[1]  Discharge Exam: Vitals:   06/23/24 0720 06/23/24 0853  BP: 118/72   Pulse: 80 79  Resp: 16 19  Temp: 98.9 F (37.2 C)   SpO2: 94% 93%    Physical Exam Vitals and nursing note reviewed.  Constitutional:      General: She is not in acute distress.    Appearance: She is ill-appearing.  HENT:     Head: Normocephalic and atraumatic.  Cardiovascular:     Rate and Rhythm: Normal rate and regular rhythm.     Pulses: Normal pulses.     Heart sounds: Normal heart sounds.  Pulmonary:     Effort: Pulmonary effort is normal.     Breath sounds: Normal breath sounds.  Abdominal:     General: Bowel sounds are normal.     Palpations: Abdomen is soft.  Neurological:     Mental Status: She is alert. Mental status is at baseline.     The results of significant diagnostics from this hospitalization (including imaging, microbiology, ancillary and laboratory) are listed below for reference.     Microbiology: No results found for this or any previous visit (from the past 240 hours).   Labs: ProBNP, BNP (last 5 results) Recent Labs    06/08/24 0218  BNP 208.7*   Basic Metabolic Panel: Recent Labs  Lab 06/17/24 1204 06/18/24 0556 06/19/24 0325 06/20/24 0432 06/21/24 0831 06/22/24 0631  NA 137 134* 139 137 138  --   K 4.0 4.4 4.7 4.5 4.4  --   CL 94* 95* 94* 95* 92*  --   CO2 35* 31 36* 37* 39*  --   GLUCOSE 131* 319* 222* 263* 111*  --   BUN 30* 34* 42* 30* 37*  --   CREATININE 0.62 0.63 0.68 0.60 0.71 0.60  CALCIUM  9.5 9.0 9.2 9.3 9.8  --    Liver Function Tests: No results for input(s): AST, ALT, ALKPHOS, BILITOT, PROT, ALBUMIN in the last 168 hours. No results for input(s): LIPASE, AMYLASE in the last 168 hours. No results for input(s): AMMONIA in the last 168 hours. CBC: Recent Labs  Lab 06/17/24 1204 06/18/24 0556 06/19/24 0325 06/20/24 0432  WBC 12.7* 10.3 9.1 9.0  HGB 10.8* 10.4* 9.8* 9.8*  HCT 36.3 35.1* 33.5* 33.7*  MCV 86.6 87.5 88.4 89.6  PLT 420* 387 354 324   Cardiac Enzymes: No results for input(s): CKTOTAL, CKMB, CKMBINDEX, TROPONINI, TROPONINIHS in the last 168 hours. BNP: No results for input(s): BNP in the last 168 hours. CBG: Recent Labs  Lab 06/22/24 1129 06/22/24 1635 06/22/24 2136 06/23/24 0644 06/23/24 1130  GLUCAP 172* 171* 265* 193* 194*   D-Dimer No results for input(s): DDIMER in the last 72 hours. Hgb A1c No results for input(s): HGBA1C in the last 72 hours. Lipid Profile No results for input(s): CHOL, HDL, LDLCALC, TRIG, CHOLHDL, LDLDIRECT in the last 72 hours. Thyroid function studies No results for input(s): TSH, T4TOTAL, FREET4, T3FREE, THYROIDAB in the last 72 hours.  Invalid input(s): FREET3 Anemia work up No results for input(s): VITAMINB12, FOLATE, FERRITIN, TIBC, IRON, RETICCTPCT in the last 72 hours. Urinalysis    Component  Value Date/Time   COLORURINE YELLOW 01/16/2020 1050   APPEARANCEUR CLOUDY (A) 01/16/2020 1050   LABSPEC 1.025 01/16/2020 1050   PHURINE 5.0 01/16/2020 1050   GLUCOSEU >=500 (A) 01/16/2020 1050   HGBUR MODERATE (A) 01/16/2020 1050   BILIRUBINUR NEGATIVE 01/16/2020 1050   KETONESUR 5 (A) 01/16/2020 1050   PROTEINUR NEGATIVE 01/16/2020 1050   UROBILINOGEN 0.2 01/07/2015 2128   NITRITE NEGATIVE 01/16/2020 1050   LEUKOCYTESUR LARGE (A) 01/16/2020 1050   Sepsis Labs Recent Labs  Lab 06/17/24 1204 06/18/24 0556 06/19/24 0325 06/20/24 0432  WBC 12.7* 10.3 9.1 9.0    Procedures/Studies: DG CHEST PORT 1 VIEW Result Date: 06/20/2024 EXAM: 1 VIEW(S) XRAY OF THE CHEST 06/20/2024 02:24:00 PM COMPARISON: 06/13/2024 CLINICAL HISTORY: Hypoxia FINDINGS: LUNGS AND PLEURA: Low lung volumes. Trace bilateral pleural effusions, decreased on right and similar on left. Diffuse interstitial prominence and peribronchial cuffing, stable. Increased linear opacities in the bilateral lower lungs. No pneumothorax. HEART AND  MEDIASTINUM: Borderline cardiomegaly. BONES AND SOFT TISSUES: No acute osseous abnormality. IMPRESSION: 1. Increased  basilar atelectasis or pneumonia. 2. Similar peribronchial and interstitial coarsening suggesting edema. 3. Trace bilateral pleural effusions, decreased on the right and similar on the left. Electronically signed by: Norman Gatlin MD 06/20/2024 11:01 PM EST RP Workstation: [id number]   DG Foot Complete Left Result Date: 06/19/2024 CLINICAL DATA:  Pain. EXAM: LEFT FOOT - COMPLETE 3+ VIEW COMPARISON:  Radiographs and CT 06/04/2024 FINDINGS: Prior resection of the fifth ray. Erosive and destructive changes involving the base of the fourth metatarsal as well as lateral cuboid. Cortical irregularity has progressed from prior exam. Overlying skin irregularity with decreased soft tissue gas. No new sites of bony destruction. No acute fracture. Vascular calcifications are seen.  IMPRESSION: 1. Erosive and destructive changes involving the base of the fourth metatarsal as well as lateral cuboid, consistent with osteomyelitis. Cortical irregularity has progressed from prior exam. 2. Diminished soft tissue gas from prior exam. Residual skin and soft tissue irregularity persists. 3. Prior resection of the fifth ray. Electronically Signed   By: Andrea Gasman M.D.   On: 06/19/2024 19:53   VAS US  LOWER EXTREMITY VENOUS (DVT) Result Date: 06/15/2024  Lower Venous DVT Study Patient Name:  DAZIYAH COGAN  Date of Exam:   06/15/2024 Medical Rec #: [id number]          Accession #:    [id number] Date of Birth: 10/16/1975          Patient Gender: F Patient Age:   30 years Exam Location:  [facility] Procedure:      VAS US  LOWER EXTREMITY VENOUS (DVT) Referring Phys: RAMESH KC --------------------------------------------------------------------------------  Indications: Swelling.  Risk Factors: None identified. Comparison Study: No prior studies. Performing Technologist: Cordella Collet RVT  Examination Guidelines: A complete evaluation includes B-mode imaging, spectral Doppler, color Doppler, and power Doppler as needed of all accessible portions of each vessel. Bilateral testing is considered an integral part of a complete examination. Limited examinations for reoccurring indications may be performed as noted. The reflux portion of the exam is performed with the patient in reverse Trendelenburg.  +---------+---------------+---------+-----------+----------+--------------+ RIGHT    CompressibilityPhasicitySpontaneityPropertiesThrombus Aging +---------+---------------+---------+-----------+----------+--------------+ CFV      Full           Yes      Yes                                 +---------+---------------+---------+-----------+----------+--------------+ SFJ      Full                                                         +---------+---------------+---------+-----------+----------+--------------+ FV Prox  Full                                                        +---------+---------------+---------+-----------+----------+--------------+ FV Mid   Full                                                        +---------+---------------+---------+-----------+----------+--------------+  FV DistalFull                                                        +---------+---------------+---------+-----------+----------+--------------+ PFV      Full                                                        +---------+---------------+---------+-----------+----------+--------------+ POP      Full           Yes      Yes                                 +---------+---------------+---------+-----------+----------+--------------+ PTV      Full                                                        +---------+---------------+---------+-----------+----------+--------------+ PERO     Full                                                        +---------+---------------+---------+-----------+----------+--------------+   +---------+---------------+---------+-----------+----------+--------------+ LEFT     CompressibilityPhasicitySpontaneityPropertiesThrombus Aging +---------+---------------+---------+-----------+----------+--------------+ CFV      Full           Yes      Yes                                 +---------+---------------+---------+-----------+----------+--------------+ SFJ      Full                                                        +---------+---------------+---------+-----------+----------+--------------+ FV Prox  Full                                                        +---------+---------------+---------+-----------+----------+--------------+ FV Mid   Full                                                         +---------+---------------+---------+-----------+----------+--------------+ FV DistalFull                                                        +---------+---------------+---------+-----------+----------+--------------+  PFV      Full                                                        +---------+---------------+---------+-----------+----------+--------------+ POP      Full           Yes      Yes                                 +---------+---------------+---------+-----------+----------+--------------+ PTV      Full                                                        +---------+---------------+---------+-----------+----------+--------------+ PERO     Full                                                        +---------+---------------+---------+-----------+----------+--------------+     Summary: RIGHT: - There is no evidence of deep vein thrombosis in the lower extremity.  - No cystic structure found in the popliteal fossa.  LEFT: - There is no evidence of deep vein thrombosis in the lower extremity.  - No cystic structure found in the popliteal fossa.  *See table(s) above for measurements and observations. Electronically signed by Fonda Rim on 06/15/2024 at 4:40:04 PM.    Final    CT Angio Chest Pulmonary Embolism (PE) W or WO Contrast Result Date: 06/14/2024 EXAM: CTA CHEST 06/14/2024 03:37:18 PM TECHNIQUE: CTA of the chest was performed after the administration of intravenous contrast. Multiplanar reformatted images are provided for review. MIP images are provided for review. Automated exposure control, iterative reconstruction, and/or weight based adjustment of the mA/kV was utilized to reduce the radiation dose to as low as reasonably achievable. COMPARISON: CT angiogram chest 01/14/2016. CLINICAL HISTORY: Pulmonary embolism (PE) suspected, high prob. FINDINGS: PULMONARY ARTERIES: Pulmonary arteries are adequately opacified for evaluation. No acute pulmonary  embolus. Main pulmonary artery is normal in caliber. MEDIASTINUM: The heart is mildly enlarged. There is no acute abnormality of the thoracic aorta. The thyroid gland is enlarged and heterogeneous. There is a left thyroid nodule measuring 3.6 x 2.4 cm which has increased in size. There are small bilaterally enlarged nodules which can be seen in the right thyroid nodule. LYMPH NODES: There are nonenlarged and mildly enlarged right hilar lymph nodes measuring up to 11 mm. There are diffusely prominent AP window, prevascular and subcarinal lymph nodes. There is an enlarged low right paratracheal lymph node measuring 13 mm. These have increased in size and number when compared to the prior study. No axillary lymphadenopathy. LUNGS AND PLEURA: There is mild patchy airspace consolidation in the bilateral lower lobes with bands of atelectasis in the right middle lobe and inferior right upper lobe. There is also a band of atelectasis in the left upper lobe. There is some smooth intralobular septal thickening in both lung apices. There are some scattered subsolid and ground glass nodules measuring up  to 5 mm. These are new from prior. No evidence of pleural effusion or pneumothorax. UPPER ABDOMEN: There are low density bilateral adrenal nodules which are partially imaged, likely adenomas. SOFT TISSUES AND BONES: No acute bone or soft tissue abnormality. . IMPRESSION: 1. No evidence of pulmonary embolism. 2. Mild patchy airspace consolidation in the bilateral lower lobes concerning for pneumonia. 3. Scattered subsolid and ground glass nodules measuring up to 5 mm, new from prior, with no routine follow-up imaging recommended as per Fleischner Society Guidelines. 4. Enlarged and heterogeneous thyroid gland with a left thyroid nodule measuring 3.6 x 2.4 cm, increased in size. Non-emergent thyroid ultrasound is recommended. 5. Mildly enlarged right hilar lymph nodes and an enlarged low right paratracheal lymph node measuring 13  mm, increased in size and number from prior. 6. Partially imaged low density bilateral adrenal nodules, likely adenomas. Electronically signed by: Greig Pique MD 06/14/2024 03:50 PM EST RP Workstation: [id number]   DG Chest Port 1 View Result Date: 06/13/2024 CLINICAL DATA:  Shortness of breath. EXAM: PORTABLE CHEST 1 VIEW COMPARISON:  06/08/2024, 07/04/2017 FINDINGS: Lungs are somewhat hypoinflated with mild patchy interstitial prominence over the mid to lower lungs with associated bibasilar opacification which may be due to small effusions with associated atelectasis versus infection. Cardiomediastinal silhouette and remainder of the exam is unchanged. IMPRESSION: Mild patchy interstitial prominence over the mid to lower lungs with associated bibasilar opacification which may be due to small effusions with associated atelectasis versus infection. Electronically Signed   By: Toribio Agreste M.D.   On: 06/13/2024 09:46   ECHOCARDIOGRAM COMPLETE Result Date: 06/08/2024    ECHOCARDIOGRAM REPORT   Patient Name:   CARLYLE ACHENBACH Date of Exam: 06/08/2024 Medical Rec #:  [id number]         Height:       67.0 in Accession #:    [id number]        Weight:       147.7 lb Date of Birth:  1976/01/14         BSA:          1.778 m Patient Age:    48 years          BP:           117/69 mmHg Patient Gender: F                 HR:           70 bpm. Exam Location:  Inpatient Procedure: 2D Echo, Color Doppler and Cardiac Doppler (Both Spectral and Color            Flow Doppler were utilized during procedure). Indications:    Pulmonary hypertension  History:        Patient has no prior history of Echocardiogram examinations.  Sonographer:    Juliene Rucks Referring Phys: [id number] RAMESH KC  Sonographer Comments: Image acquisition challenging due to uncooperative patient and Image acquisition challenging due to patient behavioral factors. IMPRESSIONS  1. Abnormal septal motion Inferior basal hypokinesis . Left ventricular ejection  fraction, by estimation, is 45 to 50%. The left ventricle has mildly decreased function. The left ventricle has no regional wall motion abnormalities. Left ventricular diastolic parameters are indeterminate.  2. Right ventricular systolic function is mildly reduced. The right ventricular size is mildly enlarged.  3. The mitral valve is abnormal. Trivial mitral valve regurgitation. No evidence of mitral stenosis.  4. The aortic valve is tricuspid. Aortic valve regurgitation is not visualized. No aortic  stenosis is present.  5. The inferior vena cava is dilated in size with >50% respiratory variability, suggesting right atrial pressure of 8 mmHg. FINDINGS  Left Ventricle: Abnormal septal motion Inferior basal hypokinesis. Left ventricular ejection fraction, by estimation, is 45 to 50%. The left ventricle has mildly decreased function. The left ventricle has no regional wall motion abnormalities. Strain was performed and the global longitudinal strain is indeterminate. The left ventricular internal cavity size was normal in size. There is no left ventricular hypertrophy. Left ventricular diastolic parameters are indeterminate. Right Ventricle: The right ventricular size is mildly enlarged. No increase in right ventricular wall thickness. Right ventricular systolic function is mildly reduced. Left Atrium: Left atrial size was normal in size. Right Atrium: Right atrial size was normal in size. Pericardium: Trivial pericardial effusion is present. The pericardial effusion is lateral to the left ventricle. Mitral Valve: The mitral valve is abnormal. There is mild thickening of the mitral valve leaflet(s). Trivial mitral valve regurgitation. No evidence of mitral valve stenosis. Tricuspid Valve: The tricuspid valve is normal in structure. Tricuspid valve regurgitation is mild . No evidence of tricuspid stenosis. Aortic Valve: The aortic valve is tricuspid. Aortic valve regurgitation is not visualized. No aortic stenosis is  present. Pulmonic Valve: The pulmonic valve was normal in structure. Pulmonic valve regurgitation is trivial. No evidence of pulmonic stenosis. Aorta: The aortic root is normal in size and structure. Venous: The inferior vena cava is dilated in size with greater than 50% respiratory variability, suggesting right atrial pressure of 8 mmHg. IAS/Shunts: No atrial level shunt detected by color flow Doppler. Additional Comments: 3D was performed not requiring image post processing on an independent workstation and was indeterminate.   LV Volumes (MOD) LV vol d, MOD A4C: 176.0 ml LV vol s, MOD A4C: 94.8 ml LV SV MOD A4C:     176.0 ml RIGHT VENTRICLE             IVC RV S prime:     18.60 cm/s  IVC diam: 2.20 cm TAPSE (M-mode): 2.4 cm LEFT ATRIUM           Index LA Vol (A4C): 38.1 ml 21.43 ml/m  AORTIC VALVE LVOT Vmax:   82.50 cm/s LVOT Vmean:  55.500 cm/s LVOT VTI:    0.158 m MITRAL VALVE MV Area (PHT): 5.13 cm    SHUNTS MV Decel Time: 148 msec    Systemic VTI: 0.16 m MV E velocity: 79.20 cm/s MV A velocity: 80.50 cm/s MV E/A ratio:  0.98 Maude Emmer MD Electronically signed by Maude Emmer MD Signature Date/Time: 06/08/2024/4:09:06 PM    Final    DG Chest Port 1 View Result Date: 06/08/2024 CLINICAL DATA:  Hypoxia . EXAM: PORTABLE CHEST 1 VIEW COMPARISON:  07/04/2017 FINDINGS: Cardiopericardial silhouette is at upper limits of normal for size. Diffuse interstitial lung opacity evident with clustered micro nodularity in the right mid lung and right base as well as left lower lobe. No substantial pleural effusion. No acute bony abnormality. IMPRESSION: Diffuse interstitial lung opacity with clustered micro nodularity in the right mid lung and right base as well as left lower lobe. Imaging features suggest multifocal pneumonia. Electronically Signed   By: Camellia Candle M.D.   On: 06/08/2024 09:20   VAS US  ABI WITH/WO TBI Result Date: 06/06/2024  LOWER EXTREMITY DOPPLER STUDY Patient Name:  PAYZLEE RYDER  Date  of Exam:   06/05/2024 Medical Rec #: [id number]          Accession #:  [id number] Date of Birth: [birth year]          Patient Gender: F Patient Age:   51 years Exam Location:  [facility] Procedure:      VAS US  ABI WITH/WO TBI Referring Phys: ANASTASSIA DOUTOVA --------------------------------------------------------------------------------  Indications: Ulceration. High Risk Factors: Diabetes, past history of smoking.  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 Comparison Study: Previous study on 11.6.2025. Performing Technologist: Edilia Elden Appl  Examination Guidelines: A complete evaluation includes at minimum, Doppler waveform signals and systolic blood pressure reading at the level of bilateral brachial, anterior tibial, and posterior tibial arteries, when vessel segments are accessible. Bilateral testing is considered an integral part of a complete examination. Photoelectric Plethysmograph (PPG) waveforms and toe systolic pressure readings are included as required and additional duplex testing as needed. Limited examinations for reoccurring indications may be performed as noted.  ABI Findings: +---------+------------------+-----+---------+--------+ Right    Rt Pressure (mmHg)IndexWaveform Comment  +---------+------------------+-----+---------+--------+ Brachial 106                    triphasic         +---------+------------------+-----+---------+--------+ PTA      142               1.18 triphasic         +---------+------------------+-----+---------+--------+ DP       123               1.02 triphasic         +---------+------------------+-----+---------+--------+ Great Toe97                0.81                   +---------+------------------+-----+---------+--------+ +---------+------------------+-----+---------+-------+ Left     Lt Pressure (mmHg)IndexWaveform Comment +---------+------------------+-----+---------+-------+ Brachial 120                     triphasic        +---------+------------------+-----+---------+-------+ Great Toe89                0.74                  +---------+------------------+-----+---------+-------+ +-------+-----------+-----------+------------+------------+ ABI/TBIToday's ABIToday's TBIPrevious ABIPrevious TBI +-------+-----------+-----------+------------+------------+ Right  1.18       0.81                                +-------+-----------+-----------+------------+------------+ Left   Unotained. 0.74                                +-------+-----------+-----------+------------+------------+  Limited exam due to patient declining to proceed with cuff pressures. Waveforms of the left lower extremity documented.  Summary: Right: Resting right ankle-brachial index is within normal range. The right toe-brachial index is normal.  Left: The left toe-brachial index is normal.  Left ABI unobtained, waveforms documented. *See table(s) above for measurements and observations.  Electronically signed by Gaile New MD on 06/06/2024 at 7:26:28 AM.    Final    CT FOOT LEFT W CONTRAST Result Date: 06/04/2024 EXAM: CT OF THE LEFT FOOT, WITH IV CONTRAST 06/04/2024 05:32:39 PM TECHNIQUE: Axial images were acquired through the left foot with IV contrast. Reformatted images were reviewed. Automated exposure control, iterative reconstruction, and/or weight based adjustment of the mA/kV was utilized to reduce the radiation dose to as low as reasonably achievable. COMPARISON: None available.  CLINICAL HISTORY: Pain and inflammation. Prior 5th ray amputation on 05/05/2024. Radiography of 06/04/2024 showed gas in the lateral soft tissues of the foot with lytic destruction of the cuboid and fourth metatarsal. FINDINGS: BONES: Prior 5th ray amputation. Bony demineralization and bony destructive findings in the base of the 4th metatarsal and the cuboid compatible with acute osteomyelitis. Plantar and achilles calcaneal  spurs. JOINTS: Gas density tracks into the margin of the articular space between the 4th metatarsal base and the cuboid. No dislocation. The joint spaces are normal, except for the involvement of the 4th metatarsal-cuboid articulation by osteomyelitis. SOFT TISSUES: Abnormal spicle gas density tracks in the subcutaneous tissues of the left foot. Diffuse subcutaneous edema dorsally and laterally along the foot. This likely reflects cellulitis. Ulceration along the lateral foot especially at the level of the lumbosacral joint. Anterior convexity of the achilles tendon contour compatible with mild distal achilles tendinopathy. IMPRESSION: 1. Acute osteomyelitis involving the base of the fourth metatarsal and the cuboid, with associated gas in the subcutaneous tissues and along the articular margin. 2. Diffuse dorsal and lateral subcutaneous edema of the foot, likely cellulitis. 3. Lateral foot ulceration. 4. Mild distal Achilles tendinopathy with anterior convexity of the Achilles tendon contour. 5. Plantar and Achilles calcaneal spurs. Electronically signed by: Ryan Salvage MD 06/04/2024 05:42 PM EST RP Workstation: [id number]   DG Foot Complete Left Result Date: 06/04/2024 EXAM: 3 VIEW(S) XRAY OF THE LEFT FOOT 06/04/2024 02:39:00 PM COMPARISON: 05/03/2024 status post surgical amputation of the fifth metatarsal and phalanges. CLINICAL HISTORY: diabetic foot wound FINDINGS: BONES AND JOINTS: Status post surgical amputation of the fifth metatarsal and phalanges. Probable lytic destruction involving the cuboid bone and proximal base of the fourth metatarsal concerning for osteomyelitis. SOFT TISSUES: Large ulceration is seen laterally in the midfoot. IMPRESSION: 1. Large lateral midfoot ulceration with lytic destruction involving the cuboid and proximal base of the fourth metatarsal, suspicious for osteomyelitis. Electronically signed by: Lynwood Seip MD 06/04/2024 02:57 PM EST RP Workstation: [id number]     Time coordinating discharge: 55 mins  SIGNED:  Norval Bar, MD [facility] 06/23/2024, 12:39 PM     [1]  Allergies Allergen Reactions   Sulfa Antibiotics Other (See Comments)    Unknown/ childhood allergy.    "

## 2024-06-23 NOTE — Telephone Encounter (Signed)
 Pharmacy Patient Advocate Encounter  Insurance verification completed.    The patient is insured through [facility] [place].     Ran test claim for Incruse Ellipta  62.34mcg and the current 30 day co-pay is $4.   This test claim was processed through [facility]- copay amounts may vary at other pharmacies due to [employer], or as the patient moves through the different stages of their insurance plan.

## 2024-06-23 NOTE — Telephone Encounter (Signed)
 Pharmacy Patient Advocate Encounter  Insurance verification completed.    The patient is insured through [place].     Ran test claim for Linezolid  600mg  tablet and the current 30 day co-pay is $4.   This test claim was processed through [employer]- copay amounts may vary at other pharmacies due to [employer], or as the patient moves through the different stages of their insurance plan.

## 2024-07-05 ENCOUNTER — Emergency Department (HOSPITAL_COMMUNITY)

## 2024-07-05 ENCOUNTER — Other Ambulatory Visit: Payer: Self-pay

## 2024-07-05 ENCOUNTER — Encounter (HOSPITAL_COMMUNITY): Payer: Self-pay

## 2024-07-05 ENCOUNTER — Emergency Department (HOSPITAL_COMMUNITY)
Admission: EM | Admit: 2024-07-05 | Discharge: 2024-07-05 | Disposition: A | Discharge status: Home / Self Care | Attending: Emergency Medicine | Admitting: Emergency Medicine

## 2024-07-05 DIAGNOSIS — E119 Type 2 diabetes mellitus without complications: Secondary | ICD-10-CM | POA: Insufficient documentation

## 2024-07-05 DIAGNOSIS — Z794 Long term (current) use of insulin: Secondary | ICD-10-CM | POA: Diagnosis not present

## 2024-07-05 DIAGNOSIS — L304 Erythema intertrigo: Secondary | ICD-10-CM | POA: Insufficient documentation

## 2024-07-05 DIAGNOSIS — D72829 Elevated white blood cell count, unspecified: Secondary | ICD-10-CM | POA: Insufficient documentation

## 2024-07-05 DIAGNOSIS — X58XXXA Exposure to other specified factors, initial encounter: Secondary | ICD-10-CM | POA: Insufficient documentation

## 2024-07-05 DIAGNOSIS — R197 Diarrhea, unspecified: Secondary | ICD-10-CM | POA: Diagnosis not present

## 2024-07-05 DIAGNOSIS — Z7984 Long term (current) use of oral hypoglycemic drugs: Secondary | ICD-10-CM | POA: Diagnosis not present

## 2024-07-05 DIAGNOSIS — S91302A Unspecified open wound, left foot, initial encounter: Secondary | ICD-10-CM | POA: Diagnosis not present

## 2024-07-05 DIAGNOSIS — S99922A Unspecified injury of left foot, initial encounter: Secondary | ICD-10-CM | POA: Diagnosis present

## 2024-07-05 LAB — COMPREHENSIVE METABOLIC PANEL WITH GFR
ALT: 34 U/L (ref 0–44)
AST: 30 U/L (ref 15–41)
Albumin: 4.1 g/dL (ref 3.5–5.0)
Alkaline Phosphatase: 90 U/L (ref 38–126)
Anion gap: 12 (ref 5–15)
BUN: 29 mg/dL — ABNORMAL HIGH (ref 6–20)
CO2: 32 mmol/L (ref 22–32)
Calcium: 10.2 mg/dL (ref 8.9–10.3)
Chloride: 94 mmol/L — ABNORMAL LOW (ref 98–111)
Creatinine, Ser: 0.69 mg/dL (ref 0.44–1.00)
GFR, Estimated: >60 mL/min
Glucose, Bld: 197 mg/dL — ABNORMAL HIGH (ref 70–99)
Potassium: 5 mmol/L (ref 3.5–5.1)
Sodium: 138 mmol/L (ref 135–145)
Total Bilirubin: 0.3 mg/dL (ref 0.0–1.2)
Total Protein: 8.6 g/dL — ABNORMAL HIGH (ref 6.5–8.1)

## 2024-07-05 LAB — CBC
HCT: 42.1 % (ref 36.0–46.0)
Hemoglobin: 12.2 g/dL (ref 12.0–15.0)
MCH: 25.7 pg — ABNORMAL LOW (ref 26.0–34.0)
MCHC: 29 g/dL — ABNORMAL LOW (ref 30.0–36.0)
MCV: 88.6 fL (ref 80.0–100.0)
Platelets: 199 K/uL (ref 150–400)
RBC: 4.75 MIL/uL (ref 3.87–5.11)
RDW: 15.8 % — ABNORMAL HIGH (ref 11.5–15.5)
WBC: 10.7 K/uL — ABNORMAL HIGH (ref 4.0–10.5)
nRBC: 0 % (ref 0.0–0.2)

## 2024-07-05 LAB — URINALYSIS, ROUTINE W REFLEX MICROSCOPIC
Bilirubin Urine: NEGATIVE
Glucose, UA: NEGATIVE mg/dL
Ketones, ur: NEGATIVE mg/dL
Nitrite: NEGATIVE
Protein, ur: 30 mg/dL — AB
Specific Gravity, Urine: 1.011 (ref 1.005–1.030)
pH: 5 (ref 5.0–8.0)

## 2024-07-05 LAB — LIPASE, BLOOD: Lipase: 134 U/L — ABNORMAL HIGH (ref 11–51)

## 2024-07-05 LAB — HCG, SERUM, QUALITATIVE: Preg, Serum: NEGATIVE

## 2024-07-05 LAB — CBG MONITORING, ED: Glucose-Capillary: 194 mg/dL — ABNORMAL HIGH (ref 70–99)

## 2024-07-05 MED ORDER — SODIUM CHLORIDE 0.9 % IV BOLUS
500.0000 mL | Freq: Once | INTRAVENOUS | Status: AC
  Administered 2024-07-05: 500 mL via INTRAVENOUS

## 2024-07-05 MED ORDER — IOHEXOL 350 MG/ML SOLN
75.0000 mL | Freq: Once | INTRAVENOUS | Status: AC | PRN
  Administered 2024-07-05: 75 mL via INTRAVENOUS

## 2024-07-05 MED ORDER — HYDROCODONE-ACETAMINOPHEN 5-325 MG PO TABS
1.0000 | ORAL_TABLET | ORAL | 0 refills | Status: DC | PRN
  Filled 2024-07-05: qty 8, 2d supply, fill #0

## 2024-07-05 MED ORDER — OXYCODONE-ACETAMINOPHEN 5-325 MG PO TABS
1.0000 | ORAL_TABLET | Freq: Once | ORAL | Status: AC
  Administered 2024-07-05: 1 via ORAL
  Filled 2024-07-05: qty 1

## 2024-07-05 MED ORDER — LOPERAMIDE HCL 2 MG PO CAPS
2.0000 mg | ORAL_CAPSULE | Freq: Once | ORAL | Status: AC
  Administered 2024-07-05: 2 mg via ORAL
  Filled 2024-07-05: qty 1

## 2024-07-05 MED ORDER — CLOTRIMAZOLE 1 % EX CREA
TOPICAL_CREAM | CUTANEOUS | 0 refills | Status: AC
  Filled 2024-07-05: qty 28, 30d supply, fill #0

## 2024-07-05 NOTE — Discharge Instructions (Signed)
 And actually look like you have constipation on the CT scan.  I would increase your fiber intake and possibly take a stool softener.  Make sure that you are taking your pantoprazole  as you had a little bit of inflammation in your stomach.  Follow-up with your doctors as discussed.  Return to the emergency room if you have any worsening symptoms.

## 2024-07-05 NOTE — ED Notes (Signed)
Pt ambulated without assistance to bathroom and back to bed

## 2024-07-05 NOTE — Progress Notes (Signed)
 "  New Patient Pulmonology Office Visit   Subjective:  Patient ID: Bianca Yang, female    DOB: 1976/01/15  MRN: [id number]  Referred by: No ref. provider found  CC:  Chief Complaint  Patient presents with   Medical Management of Chronic Issues    Pt states hosp, visit treated for pneumonia     Discussed the use of AI scribe software for clinical note transcription with the patient, who gave verbal consent to proceed.  History of Present Illness Bianca Yang is a 49 year old female with ast medical history of type 2 diabetes and chronic diabetic foot wounds, OSA not on CPAP, and tobacco use disorder >30 years x 1ppd, she was admitted on 06/08/24 for 5th toe amputation, complicated with acute osteomyelitis, pneumonia and acute hypoxic hypercarbic resp failure, who came for a follow up.  During that admission she developed new oxygen requirement at 4 L/min. Imaging showed lung opacities consistent with pneumonia. A recent chest x-ray showed improvement of the airspace disease as well of the pleural effusion. She is on Augmentin  for osteomyelitis for weeks and it causes GI upset, nausea.  She quit smoking before December 2025. She has exertional shortness of breath, weakness, and fatigue without cough or sputum. She uses Incruse Ellipta  1 puff/d and an albuterol  rescue inhaler daily.  During her hospitalization she was told she may have COPD and sleep apnea due to hypoxia while she is sleeping. She will follow up with a cardiologist this week.  She uses oxygen 24/7 since December 2025 and does not have a home oximeter to check her oxygen saturation. I encourage her to get one.   ROS as above  Allergies: Sulfa antibiotics Current Medications[1] Past Medical History:  Diagnosis Date   Diabetes mellitus without complication (HCC)    Substance abuse [facility])    Past Surgical History:  Procedure Laterality Date   AMPUTATION Left 05/05/2024   Procedure: AMPUTATION, FOOT, RAY;   Surgeon: Harden Jerona GAILS, MD;  Location: [facility] OR;  Service: Orthopedics;  Laterality: Left;  LEFT FOOT 5TH RAY AMPUTATION   APPLICATION OF WOUND VAC  05/05/2024   Procedure: APPLICATION, WOUND VAC;  Surgeon: Harden Jerona GAILS, MD;  Location: [facility] OR;  Service: Orthopedics;;   TUBAL LIGATION     Family History  Problem Relation Age of Onset   Diabetes Mother    Social History   Socioeconomic History   Marital status: Single    Spouse name: Not on file   Number of children: Not on file   Years of education: Not on file   Highest education level: Not on file  Occupational History   Not on file  Tobacco Use   Smoking status: Former    Current packs/day: 0.50    Types: Cigarettes   Smokeless tobacco: Never  Substance and Sexual Activity   Alcohol use: Not Currently    Comment: socially   Drug use: No   Sexual activity: Yes  Other Topics Concern   Not on file  Social History Narrative   Not on file   Social Drivers of Health   Tobacco Use: Medium Risk (07/05/2024)   Patient History    Smoking Tobacco Use: Former    Smokeless Tobacco Use: Never    Passive Exposure: Not on Actuary Strain: Not on file  Food Insecurity: Food Insecurity Present (06/04/2024)   Epic    Worried About Radiation Protection Practitioner of Food in the Last Year: Sometimes true  Ran Out of Food in the Last Year: Sometimes true  Transportation Needs: Unmet Transportation Needs (06/04/2024)   Epic    Lack of Transportation (Medical): Yes    Lack of Transportation (Non-Medical): No  Physical Activity: Not on file  Stress: Not on file  Social Connections: Not on file  Intimate Partner Violence: Not At Risk (06/05/2024)   Epic    Fear of Current or Ex-Partner: No    Emotionally Abused: No    Physically Abused: No    Sexually Abused: No  Depression (PHQ2-9): Not on file  Alcohol Screen: Not on file  Housing: High Risk (06/04/2024)   Epic    Unable to Pay for Housing in the Last Year: Yes    Number of Times  Moved in the Last Year: 0    Homeless in the Last Year: No  Utilities: At Risk (06/04/2024)   Epic    Threatened with loss of utilities: Already shut off  Health Literacy: Not on file       Objective:  There were no vitals taken for this visit. Wt Readings from Last 3 Encounters:  07/06/24 166 lb (75.3 kg)  07/05/24 158 lb (71.7 kg)  06/04/24 147 lb 11.3 oz (67 kg)   BMI Readings from Last 3 Encounters:  07/06/24 26.00 kg/m  07/05/24 24.02 kg/m  06/04/24 23.13 kg/m   SpO2 Readings from Last 3 Encounters:  07/06/24 99%  07/05/24 98%  06/23/24 93%    Physical Exam Constitutional:      Appearance: Normal appearance.  Eyes:     Extraocular Movements: Extraocular movements intact.     Pupils: Pupils are equal, round, and reactive to light.  Cardiovascular:     Rate and Rhythm: Normal rate and regular rhythm.  Pulmonary:     Effort: Pulmonary effort is normal.     Breath sounds: Normal breath sounds.     Comments: No wheezing, no ronchus, no crackles Musculoskeletal:     Comments: She is on wheelchair, given her recent Let toe amputation. Left foot with bandage.  Neurological:     Mental Status: She is alert.     Diagnostic Review:   CT angio 06/14/24  1. No evidence of pulmonary embolism. 2. Mild patchy airspace consolidation in the bilateral lower lobes concerning for pneumonia. 3. Scattered subsolid and ground glass nodules measuring up to 5 mm 4. Enlarged and heterogeneous thyroid gland with a left thyroid nodule measuring 3.6 x 2.4 cm, increased in size. Non-emergent thyroid ultrasound is recommended. 5. Mildly enlarged right hilar lymph nodes and an enlarged low right paratracheal lymph node measuring 13 mm, increased in size and number from prior.  Echo 06/08/24 EF 40-50%. Abnormal spetal motion inferior basal hypokinesis. No WMA. RV fx is mildly reduced and size is mildly enlarged. No valvulopathy     Assessment & Plan:   Assessment &  Plan Pneumonia with chronic hypoxic respiratory failure Recent hospitalization for pneumonia discharge on oxygen. Chest x-ray showed improvement of airspace disease and pleural effusion compared to prior. She is taking augmentin  due to osteomyelitis. - Continue oxygen therapy at 4 liters > walking test at the next appt. - Encouraged purchase of a pulse oximeter for home monitoring. - Will reassess oxygen requirement in three months.  Chronic obstructive pulmonary disease Suspected COPD based on smoking history. Pulmonary function test planned to confirm diagnosis. - Ordered pulmonary function test to assess for COPD. - Continue current inhaler regimen with Incruse Ellipta  and albuterol  as needed. - Refilled  albuterol  inhaler.  Obstructive sleep apnea Suspected obstructive sleep apnea, she reported that her oxygen was low during the night at the hospital and they recommend to do a sleep study. - Ordered PSG - Continue oxygen therapy during sleep.   Return in about 3 months (around 10/04/2024).   I personally spent a total of  30 minutes in the care of the patient today including preparing to see the patient, getting/reviewing separately obtained history, performing a medically appropriate exam/evaluation, counseling and educating, placing orders, independently interpreting results, and communicating results.    Marny Patch, MD Pulmonary and Critical Care Medicine [facility]     [1]  Current Outpatient Medications:    Accu-Chek Softclix Lancets lancets, Dispense based on patient and insurance preference. Use up to four times daily as directed. (FOR ICD-10 E10.9, E11.9)., Disp: 100 each, Rfl: 0   acetaminophen  (TYLENOL ) 500 MG tablet, Take 2 tablets (1,000 mg total) by mouth every 6 (six) hours., Disp: 30 tablet, Rfl: 0   amoxicillin -clavulanate (AUGMENTIN ) 875-125 MG tablet, Take 1 tablet by mouth every 12 (twelve) hours for 23 days., Disp: 46 tablet, Rfl: 0    Blood Glucose Monitoring Suppl (BLOOD GLUCOSE MONITOR SYSTEM) w/Device KIT, Dispense based on patient and insurance preference. Use up to four times daily as directed. (FOR ICD-10 E10.9, E11.9)., Disp: 1 kit, Rfl: 0   cyanocobalamin  (VITAMIN B12) 1000 MCG tablet, Take 0.5 tablets (500 mcg total) by mouth daily., Disp: 30 tablet, Rfl: 0   furosemide  (LASIX ) 20 MG tablet, Take 1 tablet (20 mg total) by mouth 2 (two) times daily., Disp: 60 tablet, Rfl: 0   Glucagon  (GVOKE HYPOPEN  2-PACK) 1 MG/0.2ML SOAJ, Inject 1 mg into the skin as needed for up to 2 doses (Severe low blood sugar)., Disp: 1.4 mL, Rfl: 0   Glucose Blood (BLOOD GLUCOSE TEST STRIPS) STRP, Dispense based on patient and insurance preference. Use up to four times daily as directed. (FOR ICD-10 E10.9, E11.9)., Disp: 100 each, Rfl: 0   Insulin  Glargine (BASAGLAR  KWIKPEN) 100 UNIT/ML, Inject 25 Units into the skin daily. May substitute as needed per insurance., Disp: 24 mL, Rfl: 0   Insulin  Pen Needle 32G X 4 MM MISC, Dispense based on patient and insurance preference. Use up to four times daily as directed. (FOR ICD-10 E10.9, E11.9)., Disp: 100 each, Rfl: 0   Lancet Device MISC, Dispense based on patient and insurance preference. Use up to four times daily as directed. (FOR ICD-10 E10.9, E11.9)., Disp: 1 each, Rfl: 0   linezolid  (ZYVOX ) 600 MG tablet, Take 1 tablet (600 mg total) by mouth every 12 (twelve) hours for 23 days., Disp: 46 tablet, Rfl: 0   magnesium  oxide (MAG-OX) 400 (240 Mg) MG tablet, Take 1 tablet (400 mg total) by mouth 2 (two) times daily., Disp: 60 tablet, Rfl: 0   metFORMIN  (GLUCOPHAGE ) 1000 MG tablet, Take 1 tablet (1,000 mg total) by mouth 2 (two) times daily with a meal., Disp: 60 tablet, Rfl: 0   mometasone -formoterol  (DULERA ) 100-5 MCG/ACT AERO, Inhale 2 puffs into the lungs 2 (two) times daily., Disp: 13 g, Rfl: 0   Multiple Vitamin (MULTIVITAMIN WITH MINERALS) TABS tablet, Take 1 tablet by mouth daily., Disp: 30 tablet,  Rfl: 0   naproxen  (NAPROSYN ) 500 MG tablet, Take 1 tablet (500 mg total) by mouth 2 (two) times daily with a meal., Disp: 60 tablet, Rfl: 0   pantoprazole  (PROTONIX ) 40 MG tablet, Take 1 tablet (40 mg total) by mouth daily.,  Disp: 30 tablet, Rfl: 0   polyethylene glycol powder (GLYCOLAX /MIRALAX ) 17 GM/SCOOP powder, Take 17 g by mouth daily as needed for mild constipation. Dissolve 1 capful (17g) in 4-8 ounces of liquid and take by mouth daily. (Patient not taking: Reported on 06/04/2024), Disp: 238 g, Rfl: 0   umeclidinium bromide  (INCRUSE ELLIPTA ) 62.5 MCG/ACT AEPB, Inhale 1 puff into the lungs daily., Disp: 91 each, Rfl: 0  "

## 2024-07-05 NOTE — ED Triage Notes (Addendum)
 Pt. Stated, I was discharged Dec. 26 for foot infection. Ever since I was d/c Ive had diarrhea, SOB, chest, nausea, chills off and on. I have an appt with my primary Dr. On Friday at 900. On 4L of Oxygen all the time

## 2024-07-05 NOTE — ED Provider Notes (Signed)
 " [place] EMERGENCY DEPARTMENT AT [facility] Provider Note   CSN: [id number] Arrival date & time: 07/05/24  9170     Patient presents with: Shortness of Breath, Chest Pain, Diarrhea, Nausea, Chills, and Headache   Bianca Yang is a 49 y.o. female.   Patient is a 49 year old female who presents with diarrhea.  She has a history of diabetes, obstructive sleep apnea, PAD.  She was recently admitted for osteomyelitis of the left foot as well as pneumonia and respiratory failure requiring oxygen.  She was discharged on 4.5 L/min of oxygen.  She was discharged on 12/26.  She is currently on linezolid  and Augmentin  by mouth for the next 6 weeks.  She has an upcoming appointment with Dr. Harden on January 12.  She presents because she is having ongoing diarrhea.  She says is multiple times throughout the day.  Nonbloody.  She also has some abdominal pain and bloating.  She has nausea but no vomiting.  No known fevers.  Her breathing seems to be at baseline.  No increased shortness of breath or worsening cough.  She denies any change in her foot ulcer.       Prior to Admission medications  Medication Sig Start Date End Date Taking? Authorizing Provider  clotrimazole  (LOTRIMIN ) 1 % cream Apply to affected area 2 times daily 07/05/24  Yes Lenor Hollering, MD  HYDROcodone -acetaminophen  (NORCO/VICODIN) 5-325 MG tablet Take 1-2 tablets by mouth every 4 (four) hours as needed. 07/05/24  Yes Lenor Hollering, MD  Accu-Chek Softclix Lancets lancets Dispense based on patient and insurance preference. Use up to four times daily as directed. (FOR ICD-10 E10.9, E11.9). 06/23/24   Cosette Blackwater, MD  acetaminophen  (TYLENOL ) 500 MG tablet Take 2 tablets (1,000 mg total) by mouth every 6 (six) hours. 05/09/24   Samtani, Jai-Gurmukh, MD  amoxicillin -clavulanate (AUGMENTIN ) 875-125 MG tablet Take 1 tablet by mouth every 12 (twelve) hours for 23 days. 06/23/24 07/16/24  Cosette Blackwater, MD  Blood Glucose  Monitoring Suppl (BLOOD GLUCOSE MONITOR SYSTEM) w/Device KIT Dispense based on patient and insurance preference. Use up to four times daily as directed. (FOR ICD-10 E10.9, E11.9). 06/23/24   Cosette Blackwater, MD  cyanocobalamin  (VITAMIN B12) 1000 MCG tablet Take 0.5 tablets (500 mcg total) by mouth daily. 06/24/24 08/22/24  Cosette Blackwater, MD  furosemide  (LASIX ) 20 MG tablet Take 1 tablet (20 mg total) by mouth 2 (two) times daily. 06/23/24 07/23/24  Cosette Blackwater, MD  Glucagon  (GVOKE HYPOPEN  2-PACK) 1 MG/0.2ML SOAJ Inject 1 mg into the skin as needed for up to 2 doses (Severe low blood sugar). 06/23/24   Cosette Blackwater, MD  Glucose Blood (BLOOD GLUCOSE TEST STRIPS) STRP Dispense based on patient and insurance preference. Use up to four times daily as directed. (FOR ICD-10 E10.9, E11.9). 06/23/24   Cosette Blackwater, MD  Insulin  Glargine (BASAGLAR  KWIKPEN) 100 UNIT/ML Inject 25 Units into the skin daily. May substitute as needed per insurance. 06/23/24   Cosette Blackwater, MD  Insulin  Pen Needle 32G X 4 MM MISC Dispense based on patient and insurance preference. Use up to four times daily as directed. (FOR ICD-10 E10.9, E11.9). 06/23/24   Cosette Blackwater, MD  Lancet Device MISC Dispense based on patient and insurance preference. Use up to four times daily as directed. (FOR ICD-10 E10.9, E11.9). 06/23/24   Cosette Blackwater, MD  linezolid  (ZYVOX ) 600 MG tablet Take 1 tablet (600 mg total) by mouth every 12 (twelve) hours for 23 days. 06/23/24 07/16/24  Tariq,  Norval, MD  magnesium  oxide (MAG-OX) 400 (240 Mg) MG tablet Take 1 tablet (400 mg total) by mouth 2 (two) times daily. 06/23/24   Cosette Norval, MD  metFORMIN  (GLUCOPHAGE ) 1000 MG tablet Take 1 tablet (1,000 mg total) by mouth 2 (two) times daily with a meal. 06/23/24 07/23/24  Cosette Norval, MD  mometasone -formoterol  (DULERA ) 100-5 MCG/ACT AERO Inhale 2 puffs into the lungs 2 (two) times daily. 06/23/24   Tariq, Hassan, MD  Multiple Vitamin (MULTIVITAMIN WITH MINERALS)  TABS tablet Take 1 tablet by mouth daily. 06/24/24 07/24/24  Cosette Norval, MD  naproxen  (NAPROSYN ) 500 MG tablet Take 1 tablet (500 mg total) by mouth 2 (two) times daily with a meal. 05/09/24   Samtani, Jai-Gurmukh, MD  pantoprazole  (PROTONIX ) 40 MG tablet Take 1 tablet (40 mg total) by mouth daily. 06/24/24 07/24/24  Tariq, Hassan, MD  polyethylene glycol powder (GLYCOLAX /MIRALAX ) 17 GM/SCOOP powder Take 17 g by mouth daily as needed for mild constipation. Dissolve 1 capful (17g) in 4-8 ounces of liquid and take by mouth daily. Patient not taking: Reported on 06/04/2024 05/09/24   Samtani, Jai-Gurmukh, MD  umeclidinium bromide  (INCRUSE ELLIPTA ) 62.5 MCG/ACT AEPB Inhale 1 puff into the lungs daily. 06/23/24   Cosette Norval, MD    Allergies: Sulfa antibiotics    Review of Systems  Constitutional:  Positive for fatigue. Negative for chills, diaphoresis and fever.  HENT:  Positive for rhinorrhea. Negative for congestion and sneezing.   Eyes: Negative.   Respiratory:  Positive for shortness of breath (At times, at baseline). Negative for cough and chest tightness.   Cardiovascular:  Negative for chest pain and leg swelling.  Gastrointestinal:  Positive for abdominal pain, diarrhea and nausea. Negative for blood in stool and vomiting.  Genitourinary:  Negative for difficulty urinating, flank pain and frequency.  Musculoskeletal:  Negative for arthralgias and back pain.  Skin:  Positive for wound. Negative for rash.  Neurological:  Negative for dizziness, speech difficulty, weakness, numbness and headaches.    Updated Vital Signs BP 121/68 (BP Location: Right Arm)   Pulse 86   Temp 98.4 F (36.9 C) (Oral)   Resp 15   Ht 5' 8 (1.727 m)   Wt 71.7 kg   SpO2 97%   BMI 24.02 kg/m   Physical Exam Constitutional:      Appearance: She is well-developed.  HENT:     Head: Normocephalic and atraumatic.  Eyes:     Pupils: Pupils are equal, round, and reactive to light.  Cardiovascular:      Rate and Rhythm: Normal rate and regular rhythm.     Heart sounds: Normal heart sounds.  Pulmonary:     Effort: Pulmonary effort is normal. No respiratory distress.     Breath sounds: Normal breath sounds. No wheezing or rales.     Comments: On oxygen Chest:     Chest wall: No tenderness.  Abdominal:     General: Bowel sounds are normal.     Palpations: Abdomen is soft.     Tenderness: There is no abdominal tenderness. There is no guarding or rebound.  Musculoskeletal:        General: Normal range of motion.     Cervical back: Normal range of motion and neck supple.     Comments: Healing wound to the left foot, no significant erythema, no drainage, dorsalis pedis pulses intact  Lymphadenopathy:     Cervical: No cervical adenopathy.  Skin:    General: Skin is warm and dry.  Findings: No rash.     Comments: Candidal intertrigo in her inguinal areas bilaterally and under her abdominal pannus  Neurological:     Mental Status: She is alert and oriented to person, place, and time.     (all labs ordered are listed, but only abnormal results are displayed) Labs Reviewed  LIPASE, BLOOD - Abnormal; Notable for the following components:      Result Value   Lipase 134 (*)    All other components within normal limits  COMPREHENSIVE METABOLIC PANEL WITH GFR - Abnormal; Notable for the following components:   Chloride 94 (*)    Glucose, Bld 197 (*)    BUN 29 (*)    Total Protein 8.6 (*)    All other components within normal limits  CBC - Abnormal; Notable for the following components:   WBC 10.7 (*)    MCH 25.7 (*)    MCHC 29.0 (*)    RDW 15.8 (*)    All other components within normal limits  URINALYSIS, ROUTINE W REFLEX MICROSCOPIC - Abnormal; Notable for the following components:   Color, Urine STRAW (*)    APPearance HAZY (*)    Hgb urine dipstick SMALL (*)    Protein, ur 30 (*)    Leukocytes,Ua LARGE (*)    Bacteria, UA RARE (*)    Non Squamous Epithelial 0-5 (*)    All  other components within normal limits  CBG MONITORING, ED - Abnormal; Notable for the following components:   Glucose-Capillary 194 (*)    All other components within normal limits  C DIFFICILE QUICK SCREEN W PCR REFLEX    GASTROINTESTINAL PANEL BY PCR, STOOL (REPLACES STOOL CULTURE)  URINE CULTURE  HCG, SERUM, QUALITATIVE    EKG: None  Radiology: CT ABDOMEN PELVIS W CONTRAST Result Date: 07/05/2024 EXAM: CT ABDOMEN AND PELVIS WITH CONTRAST 07/05/2024 08:55:37 PM TECHNIQUE: CT of the abdomen and pelvis was performed with the administration of intravenous contrast. Multiplanar reformatted images are provided for review. Automated exposure control, iterative reconstruction, and/or weight-based adjustment of the mA/kV was utilized to reduce the radiation dose to as low as reasonably achievable. COMPARISON: CT abdomen and pelvis 02/24/2017. CLINICAL HISTORY: Abdominal pain, acute, nonlocalized. FINDINGS: LOWER CHEST: Bands of atelectasis in the lung bases. The heart is enlarged. LIVER: The liver is mildly enlarged. GALLBLADDER AND BILE DUCTS: Gallbladder is unremarkable. No biliary ductal dilatation. SPLEEN: No acute abnormality. PANCREAS: No acute abnormality. ADRENAL GLANDS: Right adrenal nodules are indeterminate and unchanged measuring up to 2.7 cm. Left adrenal nodule measures 18 mm and is also unchanged. KIDNEYS, URETERS AND BLADDER: There is a punctate calculus in each kidney. No hydronephrosis. No perinephric or periureteral stranding. Urinary bladder is unremarkable. GI AND BOWEL: Stomach demonstrates no acute abnormality. There is wall thickening of the duodenum without surrounding inflammation. There is a large amount of stool throughout the entire colon. The appendix is normal. There is no bowel obstruction. PERITONEUM AND RETROPERITONEUM: No ascites. No free air. VASCULATURE: Aorta is normal in caliber. There are atherosclerotic calcifications of the aorta. LYMPH NODES: No lymphadenopathy.  REPRODUCTIVE ORGANS: Posterior uterine fibroid measures 14 mm. There is a 10 mm mildly hyperdense area in the left ovary. There is a cyst in the right ovary measuring 2.4 cm. BONES AND SOFT TISSUES: No acute osseous abnormality. No focal soft tissue abnormality. IMPRESSION: 1. Duodenal wall thickening without surrounding inflammation, which may reflect duodenitis. 2. Large colonic stool burden without bowel obstruction. 3. Bilateral adrenal nodules, unchanged in size (  right up to 2.7 cm and left 1.8 cm), remain indeterminate. These are favored as benign given stability. Are these definitively characterized with adrenal washout CT or chemical-shift MRI for characterization. 4. Punctate bilateral nephrolithiasis. 5. Right ovarian 2.4 cm simple-appearing cyst, no follow-up imaging recommended. 6. Small posterior uterine fibroid. Electronically signed by: Greig Pique MD MD 07/05/2024 09:18 PM EST RP Workstation: [id number]   DG Chest 2 View Result Date: 07/05/2024 EXAM: 2 VIEW(S) XRAY OF THE CHEST 07/05/2024 06:50:00 PM COMPARISON: 06/20/2024 CLINICAL HISTORY: cough FINDINGS: LUNGS AND PLEURA: Bands of atelectasis in the bilateral lung bases. Central pulmonary vascular congestion. No pleural effusion. No pneumothorax. HEART AND MEDIASTINUM: The heart is enlarged. BONES AND SOFT TISSUES: No acute osseous abnormality. IMPRESSION: 1. Enlarged heart and central pulmonary vascular congestion. 2. Bands of atelectasis in the bilateral lung bases. Electronically signed by: Greig Pique MD MD 07/05/2024 07:02 PM EST RP Workstation: [id number]     Procedures   Medications Ordered in the ED  sodium chloride  0.9 % bolus 500 mL (0 mLs Intravenous Stopped 07/05/24 1932)  loperamide  (IMODIUM ) capsule 2 mg (2 mg Oral Given 07/05/24 1904)  oxyCODONE -acetaminophen  (PERCOCET/ROXICET) 5-325 MG per tablet 1 tablet (1 tablet Oral Given 07/05/24 1914)  iohexol  (OMNIPAQUE ) 350 MG/ML injection 75 mL (75 mLs Intravenous Contrast Given  07/05/24 2056)                                    Medical Decision Making Amount and/or Complexity of Data Reviewed Labs: ordered. Radiology: ordered.  Risk Prescription drug management.   This patient presents to the ED for concern of diarrhea, abdominal pain, this involves an extensive number of treatment options, and is a complaint that carries with it a high risk of complications and morbidity.  I considered the following differential and admission for this acute, potentially life threatening condition.  The differential diagnosis includes medication effect to the antibiotics, C. difficile, viral gastroenteritis, bowel obstruction, colitis  MDM:    Patient is a 49 year old who presents with diarrhea.  She has been on a lot of antibiotics recently for osteomyelitis.  She did have some mild diffuse abdominal pain and distention.  CT scan was performed which shows actually more constipation changes.  There is little bit of possible duodenitis.  She is already on pantoprazole .  Her labs are reassuring.  I did order stool studies but she was not able to produce a stool sample here in the ED.  She actually may be having a little bit of diarrhea around her constipation.  Discussed this with the patient increasing her fiber and possibly taking a stool softener.  Chest x-ray does not show any evidence of pneumonia.  She request a few more pain pills.  I did advise her that this will worsen her constipation.  She was given a prescription for 8 tablets of Vicodin.  She was given a prescription for clotrimazole  for her candidal infection.  She has upcoming follow-up appointments with her orthopedist as well as infectious disease.  She is overall well-appearing and ate a full meal without difficulty or worsening symptoms.  She was discharged home in good condition.  Return precautions were given.  (Labs, imaging, consults)  Labs: I Ordered, and personally interpreted labs.  The pertinent results  include: Minimally elevated WBC count, electrolytes nonconcerning, urine has some concerns for infection but she is not having any urinary symptoms.  Given that she has  been on so many antibiotics, we will do a culture but not treat at this point.  Imaging Studies ordered: I ordered imaging studies including chest x-ray, CT abdomen pelvis I independently visualized and interpreted imaging. I agree with the radiologist interpretation  Additional history obtained from  .  External records from outside source obtained and reviewed including prior notes  Cardiac Monitoring: The patient was not maintained on a cardiac monitor.  If on the cardiac monitor, I personally viewed and interpreted the cardiac monitored which showed an underlying rhythm of:    Reevaluation: After the interventions noted above, I reevaluated the patient and found that they have :improved  Social Determinants of Health:    Disposition: Discharged to home  Co morbidities that complicate the patient evaluation  Past Medical History:  Diagnosis Date   Diabetes mellitus without complication (HCC)    Substance abuse (HCC)      Medicines Meds ordered this encounter  Medications   sodium chloride  0.9 % bolus 500 mL   loperamide  (IMODIUM ) capsule 2 mg   oxyCODONE -acetaminophen  (PERCOCET/ROXICET) 5-325 MG per tablet 1 tablet    Refill:  0   iohexol  (OMNIPAQUE ) 350 MG/ML injection 75 mL   HYDROcodone -acetaminophen  (NORCO/VICODIN) 5-325 MG tablet    Sig: Take 1-2 tablets by mouth every 4 (four) hours as needed.    Dispense:  8 tablet    Refill:  0   clotrimazole  (LOTRIMIN ) 1 % cream    Sig: Apply to affected area 2 times daily    Dispense:  15 g    Refill:  0    I have reviewed the patients home medicines and have made adjustments as needed  Problem List / ED Course: Problem List Items Addressed This Visit   None Visit Diagnoses       Diarrhea, unspecified type    -  Primary     Intertrigo                     Final diagnoses:  Diarrhea, unspecified type  Intertrigo    ED Discharge Orders          Ordered    HYDROcodone -acetaminophen  (NORCO/VICODIN) 5-325 MG tablet  Every 4 hours PRN        07/05/24 2209    clotrimazole  (LOTRIMIN ) 1 % cream        07/05/24 2209               Lenor Hollering, MD 07/05/24 2217  "

## 2024-07-05 NOTE — ED Notes (Signed)
 While WPT, patient states she discovered a rash between both thighs that needs to be checked out

## 2024-07-06 ENCOUNTER — Other Ambulatory Visit (HOSPITAL_COMMUNITY): Payer: Self-pay

## 2024-07-06 ENCOUNTER — Ambulatory Visit (INDEPENDENT_AMBULATORY_CARE_PROVIDER_SITE_OTHER)

## 2024-07-06 VITALS — BP 117/73 | HR 82 | Temp 97.9°F | Ht 67.0 in | Wt 166.0 lb

## 2024-07-06 DIAGNOSIS — G4733 Obstructive sleep apnea (adult) (pediatric): Secondary | ICD-10-CM | POA: Diagnosis not present

## 2024-07-06 DIAGNOSIS — Z87891 Personal history of nicotine dependence: Secondary | ICD-10-CM | POA: Diagnosis not present

## 2024-07-06 DIAGNOSIS — J9611 Chronic respiratory failure with hypoxia: Secondary | ICD-10-CM

## 2024-07-06 DIAGNOSIS — J189 Pneumonia, unspecified organism: Secondary | ICD-10-CM | POA: Diagnosis not present

## 2024-07-06 DIAGNOSIS — J449 Chronic obstructive pulmonary disease, unspecified: Secondary | ICD-10-CM

## 2024-07-06 MED ORDER — ALBUTEROL SULFATE HFA 108 (90 BASE) MCG/ACT IN AERS
2.0000 | INHALATION_SPRAY | Freq: Four times a day (QID) | RESPIRATORY_TRACT | 6 refills | Status: AC | PRN
  Filled 2024-07-06: qty 1, fill #0
  Filled 2024-07-06: qty 6.7, 20d supply, fill #0
  Filled 2024-08-03: qty 6.7, 20d supply, fill #1

## 2024-07-06 NOTE — Patient Instructions (Addendum)
 Dear Ms. Ruppel,   I recommend the following: - Continue the incruse 1 puff daily - Albuterol  - rescue inhaler, as need it every 6 hours for shortness of breath. -I order the sleep study, they will call you for an appointment. -I order a pulmonary function test at the next appointment. -I recommend to continue exercising at home, walking around the house. -Use a pulse ox, to monitor your Oxygen. If it is less 88%,use your oxygen. I will do a formal test at the next appointment.   I will see you in 3 months

## 2024-07-07 ENCOUNTER — Other Ambulatory Visit (HOSPITAL_COMMUNITY): Payer: Self-pay

## 2024-07-07 ENCOUNTER — Encounter (HOSPITAL_BASED_OUTPATIENT_CLINIC_OR_DEPARTMENT_OTHER): Payer: Self-pay

## 2024-07-07 ENCOUNTER — Other Ambulatory Visit (HOSPITAL_BASED_OUTPATIENT_CLINIC_OR_DEPARTMENT_OTHER): Payer: Self-pay

## 2024-07-07 ENCOUNTER — Ambulatory Visit (INDEPENDENT_AMBULATORY_CARE_PROVIDER_SITE_OTHER)

## 2024-07-07 VITALS — BP 97/66 | HR 85 | Ht 67.0 in | Wt 166.0 lb

## 2024-07-07 DIAGNOSIS — Z7984 Long term (current) use of oral hypoglycemic drugs: Secondary | ICD-10-CM | POA: Diagnosis not present

## 2024-07-07 DIAGNOSIS — Z7689 Persons encountering health services in other specified circumstances: Secondary | ICD-10-CM | POA: Diagnosis not present

## 2024-07-07 DIAGNOSIS — B379 Candidiasis, unspecified: Secondary | ICD-10-CM

## 2024-07-07 DIAGNOSIS — M869 Osteomyelitis, unspecified: Secondary | ICD-10-CM | POA: Diagnosis not present

## 2024-07-07 DIAGNOSIS — R197 Diarrhea, unspecified: Secondary | ICD-10-CM | POA: Diagnosis not present

## 2024-07-07 DIAGNOSIS — E118 Type 2 diabetes mellitus with unspecified complications: Secondary | ICD-10-CM

## 2024-07-07 DIAGNOSIS — F5101 Primary insomnia: Secondary | ICD-10-CM

## 2024-07-07 LAB — URINE CULTURE: Culture: NO GROWTH

## 2024-07-07 MED ORDER — MELATONIN 1 MG PO TABS
1.0000 mg | ORAL_TABLET | Freq: Every day | ORAL | 1 refills | Status: AC
  Filled 2024-07-07: qty 30, 30d supply, fill #0
  Filled 2024-08-03: qty 30, 30d supply, fill #1

## 2024-07-07 MED ORDER — POLYETHYLENE GLYCOL 3350 17 GM/SCOOP PO POWD
17.0000 g | Freq: Two times a day (BID) | ORAL | 1 refills | Status: AC | PRN
  Filled 2024-07-07: qty 714, 21d supply, fill #0

## 2024-07-07 MED ORDER — POLYETHYLENE GLYCOL 3350 17 GM/SCOOP PO POWD
17.0000 g | Freq: Two times a day (BID) | ORAL | 1 refills | Status: DC | PRN
  Filled 2024-07-07: qty 3350, 99d supply, fill #0

## 2024-07-07 MED ORDER — MELATONIN 1 MG PO TABS
1.0000 mg | ORAL_TABLET | Freq: Every day | ORAL | 1 refills | Status: DC
  Filled 2024-07-07: qty 30, 30d supply, fill #0

## 2024-07-07 NOTE — Patient Instructions (Addendum)
 Start taking the Miralax  daily  Begin taking a probiotic daily  Begin taking melatonin for insomnia

## 2024-07-07 NOTE — Progress Notes (Signed)
 "   New Patient Office Visit  Subjective:   Bianca Yang Jan 14, 1976 07/07/2024  Chief Complaint  Patient presents with   Hospitalization Follow-up    Patient is here for a follow up with her recent hospital visit. Patient is here to get established with PCP. States she's had D/N.    HPI: Bianca Yang presents today to establish care at [facility]. Introduced to publishing rights manager role and practice setting with verbalized understanding by patient.  All questions answered.   Last PCP: unsure Last annual physical: unsure Concerns: See below   Diarrhea: Reports continued diarrhea since ER visit in December. States it has also continued since being seen in the ER on 1/7. Does not understand why this keeps happening to her.   Candida infection: Requesting medication for her infection. States she used her ointment once that was prescribed on 1/7. States symptom have not resolved.   Home health for DM: States she was supposed to have a follow up with a home health aid after December admission but has not heard from anyone. Requesting new referral as she needs help with managing her medication, learning her diabetes monitor, and various ADLs due to toe amputation.  Insomnia: States recently she has struggled to fall asleep and stay asleep. Feels like she has a lot going on as well as with her pain and restless leg that makes it harder for her to sleep. States she was given meds in the hospital and believes they were muscle relaxers that helped her sleep.   The following portions of the patient's history were reviewed and updated as appropriate: past medical history, past surgical history, family history, social history, allergies, medications, and problem list.   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   Bacterial vaginosis 01/17/2020   Overweight (BMI 25.0-29.9) 01/17/2020   Cocaine abuse (HCC) 01/17/2020   Osteomyelitis (HCC) 01/16/2020   Cellulitis 11/05/2016   Cellulitis in diabetic foot (HCC) 11/04/2016   Intractable vomiting    DM (diabetes mellitus), 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   Hypoxia 01/14/2016   Tubo-ovarian abscess 01/13/2016   CONTACT DERMATITIS 12/04/2008   Past Medical History:  Diagnosis Date   Diabetes mellitus without complication (HCC)    Substance abuse (HCC)    Past Surgical History:  Procedure Laterality Date   AMPUTATION Left 05/05/2024   Procedure: AMPUTATION, FOOT, RAY;  Surgeon: Harden Jerona GAILS, MD;  Location: [facility] OR;  Service: Orthopedics;  Laterality: Left;  LEFT FOOT 5TH RAY AMPUTATION   APPLICATION OF WOUND VAC  05/05/2024   Procedure: APPLICATION, WOUND VAC;  Surgeon: Harden Jerona GAILS, MD;  Location: [facility] OR;  Service: Orthopedics;;   TUBAL LIGATION     Family History  Problem Relation Age of Onset   Diabetes Mother    Social History   Socioeconomic History   Marital status: Single    Spouse name: Not on file   Number of children: Not on file   Years of education: Not on file   Highest education level: Not on file  Occupational History   Not on  file  Tobacco Use   Smoking status: Former    Current packs/day: 0.50    Types: Cigarettes   Smokeless tobacco: Never  Substance and Sexual Activity   Alcohol use: Not Currently    Comment: socially   Drug use: No   Sexual activity: Yes  Other Topics Concern   Not on file  Social History Narrative   Not on file   Social Drivers of Health   Tobacco Use: Medium Risk (07/07/2024)   Patient History    Smoking Tobacco Use: Former     Smokeless Tobacco Use: Never    Passive Exposure: Not on file  Financial Resource Strain: Low Risk (07/07/2024)   Overall Financial Resource Strain (CARDIA)    Difficulty of Paying Living Expenses: Not hard at all  Food Insecurity: Food Insecurity Present (06/04/2024)   Epic    Worried About Programme Researcher, Broadcasting/film/video in the Last Year: Sometimes true    Ran Out of Food in the Last Year: Sometimes true  Transportation Needs: Unmet Transportation Needs (06/04/2024)   Epic    Lack of Transportation (Medical): Yes    Lack of Transportation (Non-Medical): No  Physical Activity: Inactive (07/07/2024)   Exercise Vital Sign    Days of Exercise per Week: 0 days    Minutes of Exercise per Session: 0 min  Stress: No Stress Concern Present (07/07/2024)   [employer] of Occupational Health - Occupational Stress Questionnaire    Feeling of Stress: Not at all  Social Connections: Socially Isolated (07/07/2024)   Social Connection and Isolation Panel    Frequency of Communication with Friends and Family: More than three times a week    Frequency of Social Gatherings with Friends and Family: Never    Attends Religious Services: Never    Database Administrator or Organizations: No    Attends Banker Meetings: Never    Marital Status: Never married  Intimate Partner Violence: Not At Risk (06/05/2024)   Epic    Fear of Current or Ex-Partner: No    Emotionally Abused: No    Physically Abused: No    Sexually Abused: No  Depression (PHQ2-9): High Risk (07/07/2024)   Depression (PHQ2-9)    PHQ-2 Score: 20  Alcohol Screen: Low Risk (07/07/2024)   Alcohol Screen    Last Alcohol Screening Score (AUDIT): 0  Housing: High Risk (06/04/2024)   Epic    Unable to Pay for Housing in the Last Year: Yes    Number of Times Moved in the Last Year: 0    Homeless in the Last Year: No  Utilities: At Risk (06/04/2024)   Epic    Threatened with loss of utilities: Already shut off  Health Literacy: Adequate Health  Literacy (07/07/2024)   B1300 Health Literacy    Frequency of need for help with medical instructions: Never   Outpatient Medications Prior to Visit  Medication Sig Dispense Refill   Accu-Chek Softclix Lancets lancets Dispense based on patient and insurance preference. Use up to four times daily as directed. (FOR ICD-10 E10.9, E11.9). 100 each 0   acetaminophen  (TYLENOL ) 500 MG tablet Take 2 tablets (1,000 mg total) by mouth every 6 (six) hours. 30 tablet 0   albuterol  (VENTOLIN  HFA) 108 (90 Base) MCG/ACT inhaler Inhale 2 puffs into the lungs every 6 (six) hours as needed for wheezing or shortness of breath. 6.7 g 6   amoxicillin -clavulanate (AUGMENTIN ) 875-125 MG tablet Take 1 tablet by mouth every 12 (twelve) hours for 23  days. 46 tablet 0   Blood Glucose Monitoring Suppl (BLOOD GLUCOSE MONITOR SYSTEM) w/Device KIT Dispense based on patient and insurance preference. Use up to four times daily as directed. (FOR ICD-10 E10.9, E11.9). 1 kit 0   clotrimazole  (LOTRIMIN ) 1 % cream Apply to affected area 2 times daily 28 g 0   cyanocobalamin  (VITAMIN B12) 1000 MCG tablet Take 0.5 tablets (500 mcg total) by mouth daily. 30 tablet 0   furosemide  (LASIX ) 20 MG tablet Take 1 tablet (20 mg total) by mouth 2 (two) times daily. 60 tablet 0   Glucagon  (GVOKE HYPOPEN  2-PACK) 1 MG/0.2ML SOAJ Inject 1 mg into the skin as needed for up to 2 doses (Severe low blood sugar). 1.4 mL 0   Glucose Blood (BLOOD GLUCOSE TEST STRIPS) STRP Dispense based on patient and insurance preference. Use up to four times daily as directed. (FOR ICD-10 E10.9, E11.9). 100 each 0   HYDROcodone -acetaminophen  (NORCO/VICODIN) 5-325 MG tablet Take 1-2 tablets by mouth every 4 (four) hours as needed. 8 tablet 0   Insulin  Glargine (BASAGLAR  KWIKPEN) 100 UNIT/ML Inject 25 Units into the skin daily. May substitute as needed per insurance. 24 mL 0   Insulin  Pen Needle 32G X 4 MM MISC Dispense based on patient and insurance preference. Use up to four  times daily as directed. (FOR ICD-10 E10.9, E11.9). 100 each 0   Lancet Device MISC Dispense based on patient and insurance preference. Use up to four times daily as directed. (FOR ICD-10 E10.9, E11.9). 1 each 0   linezolid  (ZYVOX ) 600 MG tablet Take 1 tablet (600 mg total) by mouth every 12 (twelve) hours for 23 days. 46 tablet 0   magnesium  oxide (MAG-OX) 400 (240 Mg) MG tablet Take 1 tablet (400 mg total) by mouth 2 (two) times daily. 60 tablet 0   metFORMIN  (GLUCOPHAGE ) 1000 MG tablet Take 1 tablet (1,000 mg total) by mouth 2 (two) times daily with a meal. 60 tablet 0   mometasone -formoterol  (DULERA ) 100-5 MCG/ACT AERO Inhale 2 puffs into the lungs 2 (two) times daily. 13 g 0   Multiple Vitamin (MULTIVITAMIN WITH MINERALS) TABS tablet Take 1 tablet by mouth daily. 30 tablet 0   naproxen  (NAPROSYN ) 500 MG tablet Take 1 tablet (500 mg total) by mouth 2 (two) times daily with a meal. 60 tablet 0   pantoprazole  (PROTONIX ) 40 MG tablet Take 1 tablet (40 mg total) by mouth daily. 30 tablet 0   umeclidinium bromide  (INCRUSE ELLIPTA ) 62.5 MCG/ACT AEPB Inhale 1 puff into the lungs daily. 91 each 0   polyethylene glycol powder (GLYCOLAX /MIRALAX ) 17 GM/SCOOP powder Take 17 g by mouth daily as needed for mild constipation. Dissolve 1 capful (17g) in 4-8 ounces of liquid and take by mouth daily. 238 g 0   No facility-administered medications prior to visit.   Allergies[1]  ROS: A complete ROS was performed with pertinent positives/negatives noted in the HPI. The remainder of the ROS are negative.   Objective:   Today's Vitals   07/07/24 0911  BP: 97/66  Pulse: 85  SpO2: 90%  Weight: 166 lb (75.3 kg)  Height: 5' 7 (1.702 m)    GENERAL: Well-appearing, in NAD. Well nourished. SKIN: Pink, warm and dry. No rash, lesion, ulceration, or ecchymoses. Wound dressing to left foot.   RESPIRATORY: Chest wall symmetrical. Respirations even and non-labored. Breath sounds clear to auscultation bilaterally.  On 4L [place] CARDIAC: S1, S2 present, regular rate and rhythm without murmur or gallops. Peripheral pulses 2+ bilaterally.  MSK: Muscle tone and strength appropriate for age. Decreased strength upon ambulation due to toe amputation EXTREMITIES: Without clubbing, cyanosis, or edema.  NEUROLOGIC: No motor or sensory deficits. Steady, uneven gait. C2-C12 intact.  PSYCH/MENTAL STATUS: Alert, oriented x 3. Cooperative, appropriate mood and affect.    Health Maintenance Due  Topic Date Due   FOOT EXAM  Never done   OPHTHALMOLOGY EXAM  Never done   Diabetic kidney evaluation - Urine ACR  Never done   Hepatitis B Vaccines 19-59 Average Risk (1 of 3 - 19+ 3-dose series) Never done   Cervical Cancer Screening (HPV/Pap Cotest)  Never done   Mammogram  Never done   Colonoscopy  Never done   Influenza Vaccine  01/28/2024   COVID-19 Vaccine (1 - 2025-26 season) Never done    No results found for any visits on 07/07/24.     Assessment & Plan:  1. Encounter to establish care with new doctor (Primary) Discussed role of NP and expectations of the Primary Care Clinic. Discussed medical, surgical, and family history.   2. Osteomyelitis of left foot, unspecified type (HCC) Request for home health placed due to pt not able to ambulate as normal due to recent surgery. Discussed continued close follow up with wound care and when to be seen in ER for red flag  symptoms. Dressing looks clean dry and in tact.  - Ambulatory referral to Home Health  3. Candida infection Discussed giving the medication more than 1 day to take effect. If infection still persists, pt to reach out to PCP.  4. Diarrhea, unspecified type Discussed due to her large stool burden noted on CT abdomen on 1/7 she is having loose stool around her constipation. Discussed implementing daily miralax  to help with passage of stool. Discussed her long term use of antibiotics are also the reason she is having diarrhea. Discussed implementing a daily  probiotic into her regimen to help build back good bacteria in her gut.  - polyethylene glycol powder (GLYCOLAX /MIRALAX ) 17 GM/SCOOP powder; Take 17 g by mouth 2 (two) times daily as needed. Dissolve 1 capful (17g) in 4-8 ounces of liquid and take by mouth daily.  Dispense: 3350 g; Refill: 1  5. Primary insomnia Discussed trying melatonin to help with falling asleep.  - melatonin 1 MG TABS tablet; Take 1 tablet (1 mg total) by mouth at bedtime.  Dispense: 30 tablet; Refill: 1  6. DM (diabetes mellitus), type 2 with complications [facility]) Referral placed for home health to help aid in management of medication and education on how to use her monitor. I discussed if she is still having issues at her next appt she can bring her monitor with her and we will help her - Ambulatory referral to Home Health    Patient to reach out to office if new, worrisome, or unresolved symptoms arise or if no improvement in patient's condition. Patient verbalized understanding and is agreeable to treatment plan. All questions answered to patient's satisfaction.    Return in about 4 weeks (around 08/04/2024) for annual physical (fasting labs day of).    Lauraine Almarie Angus DNP, FNP-C     [1]  Allergies Allergen Reactions   Sulfa Antibiotics Other (See Comments)    Unknown/ childhood allergy.    "

## 2024-07-10 ENCOUNTER — Ambulatory Visit: Admitting: Orthopedic Surgery

## 2024-07-10 DIAGNOSIS — Z89422 Acquired absence of other left toe(s): Secondary | ICD-10-CM

## 2024-07-10 MED ORDER — HYDROCODONE-ACETAMINOPHEN 5-325 MG PO TABS: 1.0000 | ORAL_TABLET | Freq: Four times a day (QID) | ORAL | 0 refills | Status: AC | PRN

## 2024-07-11 ENCOUNTER — Encounter: Payer: Self-pay | Admitting: Orthopedic Surgery

## 2024-07-11 NOTE — Progress Notes (Signed)
 "  Office Visit Note   Patient: Bianca Yang           Date of Birth: October 12, 1975           MRN: [id number] Visit Date: 07/10/2024              Requested by: No referring provider defined for this encounter. PCP: Gari Lauraine BRAVO, FNP  Chief Complaint  Patient presents with   Left Foot - Routine Post Op    05/05/2024 left 5th ray amputation      HPI: Discussed the use of AI scribe software for clinical note transcription with the patient, who gave verbal consent to proceed.  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  She continues to have a persistent ulcer involving the right fifth toe and plantar aspect of the foot, with ongoing pain, serous drainage, and intermittent bleeding. She reports that the wound appears too wet to her and that she is unable to bear weight on the affected foot.  She performs daily wound care using Vashe solution as instructed at hospital discharge, cleansing the area and debriding necrotic tissue as tolerated. She has not applied antibiotic ointment, per prior instructions, and expresses concern regarding excessive wound moisture.  She completed a course of hydrocodone  for pain control but has exhausted her supply and requests a refill. She obtains her medications from the [facility] pharmacy in [place]. She denies new systemic symptoms such as fever or chills.     Assessment & Plan: Visit Diagnoses:  1. 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 No wound dehiscence or cellulitis. Requires ongoing wound care and strict non-weight bearing to promote healing. - Instructed her  to cleanse the wound daily with soap and water, ensuring thorough drying. - Advised daily dressing changes with gauze dampened with Vashe, followed by Ace wrap. - Recommended gentle debridement of necrotic tissue during dressing changes. - Reinforced non-weight bearing status until wound healing improves. - Provided education on obtaining Vashe from local pharmacy. - Scheduled follow-up in one week to reassess wound healing.  Chronic pain due to foot ulcer Persistent pain secondary to chronic foot ulcer. Previous hydrocodone  prescription completed. Requires ongoing analgesia. - Called in a prescription for Vicodin to [facility] pharmacy in [facility] for pain management. - Discussed pain management options and confirmed pharmacy preference.      Follow-Up Instructions: Return in about 1 week (around 07/17/2024).   Ortho Exam  Patient is alert, oriented, no adenopathy, well-dressed, normal affect, normal respiratory effort. Physical Exam SKIN: Wound macerated with ischemic tissue plantarly. No wound dehiscence or cellulitis.   Status post left foot fifth ray amputation.  Hemoglobin A1c consistently above 11.   Imaging: No results found. No images are attached to the encounter.  Labs: Lab Results  Component Value Date   HGBA1C 11.6 (H) 05/04/2024   HGBA1C 12.0 (H) 01/16/2020   HGBA1C 11.4 (H) 11/04/2016   ESRSEDRATE 41 (H) 06/05/2024   ESRSEDRATE 10 11/04/2016   CRP 2.9 (H) 06/05/2024   CRP <0.8 11/04/2016   REPTSTATUS 07/07/2024 FINAL 07/05/2024   GRAMSTAIN  05/05/2024  FEW WBC PRESENT, PREDOMINANTLY PMN FEW GRAM POSITIVE COCCI RARE GRAM NEGATIVE RODS Performed at [facility], [address] [place], [place] [address]    CULT  07/05/2024    NO GROWTH Performed at [facility], [address], [place], [place] [address]    LABORGA METHICILLIN RESISTANT STAPHYLOCOCCUS AUREUS 05/05/2024   LABORGA STREPTOCOCCUS CONSTELLATUS 05/05/2024     Lab  Results  Component Value Date   ALBUMIN 4.1 07/05/2024   ALBUMIN 2.9 (L) 06/05/2024   ALBUMIN 3.0 (L) 06/04/2024   PREALBUMIN 17 (L) 06/05/2024    Lab Results  Component Value Date   MG 1.7 06/08/2024   MG 1.4 (L) 06/05/2024   No results found for: [facility]  Lab Results  Component Value Date   PREALBUMIN 17 (L) 06/05/2024      Latest Ref Rng & Units 07/05/2024    9:04 AM 06/20/2024    4:32 AM 06/19/2024    3:25 AM  CBC EXTENDED  WBC 4.0 - 10.5 K/uL 10.7  9.0  9.1   RBC 3.87 - 5.11 MIL/uL 4.75  3.76  3.79   Hemoglobin 12.0 - 15.0 g/dL 87.7  9.8  9.8   HCT 63.9 - 46.0 % 42.1  33.7  33.5   Platelets 150 - 400 K/uL 199  324  354      There is no height or weight on file to calculate BMI.  Orders:  No orders of the defined types were placed in this encounter.  Meds ordered this encounter  Medications   HYDROcodone -acetaminophen  (NORCO/VICODIN) 5-325 MG tablet    Sig: Take 1 tablet by mouth every 6 (six) hours as needed.    Dispense:  20 tablet    Refill:  0     Procedures: No procedures performed  Clinical Data: No additional findings.  ROS:  All other systems negative, except as noted in the HPI. Review of Systems  Objective: Vital Signs: LMP  (LMP Unknown)   Specialty Comments:  No specialty comments available.  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   Bacterial vaginosis 01/17/2020   Overweight (BMI 25.0-29.9) 01/17/2020   Cocaine abuse (HCC) 01/17/2020   Osteomyelitis (HCC) 01/16/2020   Cellulitis 11/05/2016   Cellulitis in diabetic foot (HCC) 11/04/2016   Intractable  vomiting    DM (diabetes mellitus), 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   Hypoxia 01/14/2016   Tubo-ovarian abscess 01/13/2016   CONTACT DERMATITIS 12/04/2008   Past Medical History:  Diagnosis Date   Diabetes mellitus without complication (HCC)    Substance abuse (HCC)     Family History  Problem Relation Age of Onset   Diabetes Mother     Past Surgical History:  Procedure Laterality Date   AMPUTATION Left 05/05/2024   Procedure: AMPUTATION, FOOT, RAY;  Surgeon: Harden Jerona GAILS, MD;  Location: [facility] OR;  Service: Orthopedics;  Laterality: Left;  LEFT FOOT 5TH RAY AMPUTATION   APPLICATION OF WOUND VAC  05/05/2024   Procedure: APPLICATION, WOUND VAC;  Surgeon: Harden Jerona GAILS, MD;  Location: [facility] OR;  Service: Orthopedics;;   TUBAL LIGATION  Social History   Occupational History   Not on file  Tobacco Use   Smoking status: Former    Current packs/day: 0.50    Types: Cigarettes   Smokeless tobacco: Never  Substance and Sexual Activity   Alcohol use: Not Currently    Comment: socially   Drug use: No   Sexual activity: Yes         "

## 2024-07-17 ENCOUNTER — Ambulatory Visit: Admitting: Orthopedic Surgery

## 2024-07-17 ENCOUNTER — Encounter: Payer: Self-pay | Admitting: Orthopedic Surgery

## 2024-07-17 DIAGNOSIS — Z89422 Acquired absence of other left toe(s): Secondary | ICD-10-CM

## 2024-07-17 NOTE — Progress Notes (Signed)
 "  Office Visit Note   Patient: Bianca Yang           Date of Birth: 1975/08/26           MRN: [id number] Visit Date: 07/17/2024              Requested by: Gari Lauraine BRAVO, FNP [address] [place],  [place] [address] PCP: Gari Lauraine BRAVO, FNP  Chief Complaint  Patient presents with   Left Foot - Routine Post Op    05/05/2024 left 5th ray amputation      HPI: Discussed the use of AI scribe software for clinical note transcription with the patient, who gave verbal consent to proceed.  History of Present Illness Bianca Yang is a 49 year old female, two months status post left foot fifth toe reamputation, who presents for follow-up of chronic left foot ulcer healing.  She is two months post left foot fifth toe reamputation and presents for ongoing management of a chronic lateral foot ulcer. There has been significant improvement in the wound, with resolution of previously noted blackish discoloration. She notes mild swelling of the leg and describes the area as mildly warm without new or worsening symptoms. There is an eschar present on the plantar aspect of the foot, and she experiences ticklishness in the middle of the plantar surface, which complicates wound cleaning.  Wound care consists of cleansing with soap and water and application of antibacterial ointment, performed with assistance from her caregiver. She is currently taking amoxicillin  and linezolid .     Assessment & Plan: Visit Diagnoses:  1. 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 Mild swelling and a 3 cm plantar eschar indicate healing. Oxygen therapy aids recovery. - Continue wound care with soap, water, and antibacterial ointment. - Continue amoxicillin  and linezolid   until current prescription expires; no refills. - Follow up in four weeks for reassessment.      Follow-Up Instructions: Return in about 4 weeks (around 08/14/2024).   Ortho Exam  Patient is alert, oriented, no adenopathy, well-dressed, normal affect, normal respiratory effort. Physical Exam EXTREMITIES: Lateral foot ulcer well healed with no drainage or cellulitis, slight swelling present. Eschar on plantar aspect of foot, 3 cm in diameter.      Imaging: No results found. No images are attached to the encounter.  Labs: Lab Results  Component Value Date   HGBA1C 11.6 (H) 05/04/2024   HGBA1C 12.0 (H) 01/16/2020   HGBA1C 11.4 (H) 11/04/2016   ESRSEDRATE 41 (H) 06/05/2024   ESRSEDRATE 10 11/04/2016   CRP 2.9 (H) 06/05/2024   CRP <0.8 11/04/2016   REPTSTATUS 07/07/2024 FINAL 07/05/2024   GRAMSTAIN  05/05/2024    FEW WBC PRESENT, PREDOMINANTLY PMN FEW GRAM POSITIVE COCCI RARE GRAM NEGATIVE RODS Performed at [facility], [address], [place], [place] [address]    CULT  07/05/2024    NO GROWTH Performed at [facility], [address] [place], [place] [address]    LABORGA METHICILLIN RESISTANT STAPHYLOCOCCUS AUREUS 05/05/2024   LABORGA STREPTOCOCCUS CONSTELLATUS 05/05/2024     Lab Results  Component Value Date   ALBUMIN 4.1 07/05/2024   ALBUMIN 2.9 (L) 06/05/2024   ALBUMIN 3.0 (L) 06/04/2024   PREALBUMIN 17 (L) 06/05/2024  Lab Results  Component Value Date   MG 1.7 06/08/2024   MG 1.4 (L) 06/05/2024   No results found for: VD25OH  Lab Results  Component Value Date   PREALBUMIN 17 (L) 06/05/2024      Latest Ref Rng & Units 07/05/2024    9:04 AM 06/20/2024    4:32 AM 06/19/2024    3:25 AM  CBC EXTENDED  WBC 4.0 - 10.5 K/uL 10.7  9.0  9.1   RBC 3.87 - 5.11 MIL/uL 4.75  3.76  3.79   Hemoglobin 12.0 - 15.0 g/dL 87.7  9.8  9.8   HCT 63.9 - 46.0 % 42.1  33.7  33.5   Platelets 150 - 400 K/uL 199  324  354      There is no height or  weight on file to calculate BMI.  Orders:  No orders of the defined types were placed in this encounter.  No orders of the defined types were placed in this encounter.    Procedures: No procedures performed  Clinical Data: No additional findings.  ROS:  All other systems negative, except as noted in the HPI. Review of Systems  Objective: Vital Signs: LMP  (LMP Unknown)   Specialty Comments:  No specialty comments available.  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   Bacterial vaginosis 01/17/2020   Overweight (BMI 25.0-29.9) 01/17/2020   Cocaine abuse (HCC) 01/17/2020   Osteomyelitis (HCC) 01/16/2020   Cellulitis 11/05/2016   Cellulitis in diabetic foot (HCC) 11/04/2016   Intractable vomiting    DM (diabetes mellitus), 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   Hypoxia 01/14/2016   Tubo-ovarian abscess 01/13/2016   CONTACT DERMATITIS 12/04/2008   Past Medical History:  Diagnosis Date   Diabetes mellitus without complication (HCC)    Substance abuse (HCC)     Family History  Problem Relation Age of Onset   Diabetes Mother     Past Surgical History:  Procedure Laterality Date   AMPUTATION Left 05/05/2024   Procedure: AMPUTATION, FOOT, RAY;  Surgeon: Harden Jerona GAILS, MD;  Location: [facility] OR;  Service: Orthopedics;  Laterality: Left;  LEFT FOOT 5TH RAY AMPUTATION   APPLICATION OF WOUND VAC  05/05/2024   Procedure: APPLICATION, WOUND VAC;  Surgeon: Harden Jerona GAILS, MD;  Location: [facility] OR;  Service: Orthopedics;;   TUBAL LIGATION     Social History   Occupational History   Not on file   Tobacco Use   Smoking status: Former    Current packs/day: 0.50    Types: Cigarettes   Smokeless tobacco: Never  Substance and Sexual Activity   Alcohol use: Not Currently    Comment: socially   Drug use: No   Sexual activity: Yes         "

## 2024-07-18 ENCOUNTER — Telehealth (HOSPITAL_BASED_OUTPATIENT_CLINIC_OR_DEPARTMENT_OTHER): Payer: Self-pay

## 2024-07-18 NOTE — Telephone Encounter (Signed)
 Copied from CRM [id number]. Topic: Referral - Status >> Jul 18, 2024 10:25 AM Terri MATSU wrote: Reason for CRM: Patient stated nobody has contacted her regarding her referral to Home Health. And she still doesn't know how to read her blood sugar monitor and the referral was placed on 1/9. Advised her on the turnaround time of 7-10business days but she stated she needs someone asap so she can know what her reading is.

## 2024-07-21 ENCOUNTER — Other Ambulatory Visit (HOSPITAL_COMMUNITY): Payer: Self-pay

## 2024-07-21 ENCOUNTER — Emergency Department (HOSPITAL_COMMUNITY)

## 2024-07-21 ENCOUNTER — Other Ambulatory Visit: Payer: Self-pay

## 2024-07-21 ENCOUNTER — Ambulatory Visit: Payer: Self-pay

## 2024-07-21 ENCOUNTER — Emergency Department (HOSPITAL_COMMUNITY)
Admission: EM | Admit: 2024-07-21 | Discharge: 2024-07-22 | Disposition: A | Attending: Emergency Medicine | Admitting: Emergency Medicine

## 2024-07-21 ENCOUNTER — Encounter (HOSPITAL_COMMUNITY): Payer: Self-pay | Admitting: *Deleted

## 2024-07-21 ENCOUNTER — Telehealth (HOSPITAL_BASED_OUTPATIENT_CLINIC_OR_DEPARTMENT_OTHER): Payer: Self-pay

## 2024-07-21 DIAGNOSIS — M86672 Other chronic osteomyelitis, left ankle and foot: Secondary | ICD-10-CM | POA: Diagnosis not present

## 2024-07-21 DIAGNOSIS — J449 Chronic obstructive pulmonary disease, unspecified: Secondary | ICD-10-CM | POA: Insufficient documentation

## 2024-07-21 DIAGNOSIS — G43809 Other migraine, not intractable, without status migrainosus: Secondary | ICD-10-CM | POA: Insufficient documentation

## 2024-07-21 DIAGNOSIS — Z9981 Dependence on supplemental oxygen: Secondary | ICD-10-CM | POA: Insufficient documentation

## 2024-07-21 DIAGNOSIS — Z794 Long term (current) use of insulin: Secondary | ICD-10-CM | POA: Diagnosis not present

## 2024-07-21 DIAGNOSIS — E1165 Type 2 diabetes mellitus with hyperglycemia: Secondary | ICD-10-CM | POA: Diagnosis present

## 2024-07-21 DIAGNOSIS — Z7984 Long term (current) use of oral hypoglycemic drugs: Secondary | ICD-10-CM | POA: Diagnosis not present

## 2024-07-21 LAB — I-STAT VENOUS BLOOD GAS, ED
Acid-Base Excess: 12 mmol/L — ABNORMAL HIGH (ref 0.0–2.0)
Bicarbonate: 39.2 mmol/L — ABNORMAL HIGH (ref 20.0–28.0)
Calcium, Ion: 1.09 mmol/L — ABNORMAL LOW (ref 1.15–1.40)
HCT: 40 % (ref 36.0–46.0)
Hemoglobin: 13.6 g/dL (ref 12.0–15.0)
O2 Saturation: 46 %
Potassium: 4 mmol/L (ref 3.5–5.1)
Sodium: 138 mmol/L (ref 135–145)
TCO2: 41 mmol/L — ABNORMAL HIGH (ref 22–32)
pCO2, Ven: 59.4 mmHg (ref 44–60)
pH, Ven: 7.427 (ref 7.25–7.43)
pO2, Ven: 26 mmHg — CL (ref 32–45)

## 2024-07-21 LAB — CBC WITH DIFFERENTIAL/PLATELET
Abs Immature Granulocytes: 0.1 K/uL — ABNORMAL HIGH (ref 0.00–0.07)
Basophils Absolute: 0.1 K/uL (ref 0.0–0.1)
Basophils Relative: 1 %
Eosinophils Absolute: 1.4 K/uL — ABNORMAL HIGH (ref 0.0–0.5)
Eosinophils Relative: 14 %
HCT: 38.9 % (ref 36.0–46.0)
Hemoglobin: 11.5 g/dL — ABNORMAL LOW (ref 12.0–15.0)
Immature Granulocytes: 1 %
Lymphocytes Relative: 22 %
Lymphs Abs: 2.2 K/uL (ref 0.7–4.0)
MCH: 26.1 pg (ref 26.0–34.0)
MCHC: 29.6 g/dL — ABNORMAL LOW (ref 30.0–36.0)
MCV: 88.4 fL (ref 80.0–100.0)
Monocytes Absolute: 1 K/uL (ref 0.1–1.0)
Monocytes Relative: 10 %
Neutro Abs: 5.2 K/uL (ref 1.7–7.7)
Neutrophils Relative %: 52 %
Platelets: 159 K/uL (ref 150–400)
RBC: 4.4 MIL/uL (ref 3.87–5.11)
RDW: 16.2 % — ABNORMAL HIGH (ref 11.5–15.5)
WBC: 9.9 K/uL (ref 4.0–10.5)
nRBC: 0 % (ref 0.0–0.2)

## 2024-07-21 LAB — URINALYSIS, ROUTINE W REFLEX MICROSCOPIC
Bilirubin Urine: NEGATIVE
Glucose, UA: >=500 mg/dL — AB
Hgb urine dipstick: NEGATIVE
Ketones, ur: NEGATIVE mg/dL
Nitrite: NEGATIVE
Protein, ur: 30 mg/dL — AB
Specific Gravity, Urine: 1.012 (ref 1.005–1.030)
pH: 6 (ref 5.0–8.0)

## 2024-07-21 LAB — COMPREHENSIVE METABOLIC PANEL WITH GFR
ALT: 34 U/L (ref 0–44)
AST: 24 U/L (ref 15–41)
Albumin: 4.2 g/dL (ref 3.5–5.0)
Alkaline Phosphatase: 125 U/L (ref 38–126)
Anion gap: 10 (ref 5–15)
BUN: 14 mg/dL (ref 6–20)
CO2: 37 mmol/L — ABNORMAL HIGH (ref 22–32)
Calcium: 9.7 mg/dL (ref 8.9–10.3)
Chloride: 92 mmol/L — ABNORMAL LOW (ref 98–111)
Creatinine, Ser: 0.49 mg/dL (ref 0.44–1.00)
GFR, Estimated: >60 mL/min
Glucose, Bld: 258 mg/dL — ABNORMAL HIGH (ref 70–99)
Potassium: 3.9 mmol/L (ref 3.5–5.1)
Sodium: 138 mmol/L (ref 135–145)
Total Bilirubin: 0.3 mg/dL (ref 0.0–1.2)
Total Protein: 8.1 g/dL (ref 6.5–8.1)

## 2024-07-21 LAB — CBG MONITORING, ED: Glucose-Capillary: 188 mg/dL — ABNORMAL HIGH (ref 70–99)

## 2024-07-21 LAB — BETA-HYDROXYBUTYRIC ACID: Beta-Hydroxybutyric Acid: 0.06 mmol/L (ref 0.05–0.27)

## 2024-07-21 MED ORDER — MORPHINE SULFATE (PF) 4 MG/ML IV SOLN
4.0000 mg | Freq: Once | INTRAVENOUS | Status: AC
  Administered 2024-07-21: 4 mg via INTRAVENOUS
  Filled 2024-07-21: qty 1

## 2024-07-21 MED ORDER — KETOROLAC TROMETHAMINE 30 MG/ML IJ SOLN
30.0000 mg | Freq: Once | INTRAMUSCULAR | Status: AC
  Administered 2024-07-22: 30 mg via INTRAVENOUS
  Filled 2024-07-21: qty 1

## 2024-07-21 MED ORDER — BACITRACIN ZINC 500 UNIT/GM EX OINT
TOPICAL_OINTMENT | Freq: Once | CUTANEOUS | Status: AC
  Administered 2024-07-22: 1 via TOPICAL
  Filled 2024-07-21: qty 0.9

## 2024-07-21 MED ORDER — DIPHENHYDRAMINE HCL 50 MG/ML IJ SOLN
12.5000 mg | Freq: Once | INTRAMUSCULAR | Status: AC
  Administered 2024-07-22: 12.5 mg via INTRAVENOUS
  Filled 2024-07-21: qty 1

## 2024-07-21 MED ORDER — ONDANSETRON HCL 4 MG/2ML IJ SOLN
4.0000 mg | Freq: Once | INTRAMUSCULAR | Status: AC
  Administered 2024-07-21: 4 mg via INTRAVENOUS
  Filled 2024-07-21: qty 2

## 2024-07-21 MED ORDER — METOCLOPRAMIDE HCL 5 MG/ML IJ SOLN
10.0000 mg | Freq: Once | INTRAMUSCULAR | Status: AC
  Administered 2024-07-22: 10 mg via INTRAVENOUS
  Filled 2024-07-21: qty 2

## 2024-07-21 MED ORDER — SODIUM CHLORIDE 0.9 % IV BOLUS
1000.0000 mL | Freq: Once | INTRAVENOUS | Status: AC
  Administered 2024-07-21: 1000 mL via INTRAVENOUS

## 2024-07-21 NOTE — Telephone Encounter (Signed)
 Copied from CRM #[id number]. Topic: Clinical - Home Health Verbal Orders >> Jul 21, 2024  2:43 PM Victoria B wrote: Caller/Agency: patient, Bianca Yang [phone]  Service Requested: Skilled Nursing/ states needs help at home Frequency: ? Any new concerns about the patient? no

## 2024-07-21 NOTE — Telephone Encounter (Signed)
 Copied from CRM [id number]. Topic: Referral - Status >> Jul 21, 2024  1:34 PM Donna BRAVO wrote: Reason for CRM:   [facility] does not accept patient  insurance, order is for wound care              Jon Rogue did not realized there was a referral in Epic  on 07/24/24 , until one faxed showed up on 07/21/24  Jon Rogue would like to speak with a nurse regarding this issue.

## 2024-07-21 NOTE — Telephone Encounter (Signed)
 Copied from CRM [id number]. Topic: Clinical - Medication Refill >> Jul 21, 2024  2:30 PM Victoria B wrote: Medication: cyanocobalamin  (VITAMIN B12) 1000 MCG tablet/  magnesium  oxide (MAG-OX) 400 (240 Mg) MG tablet/ furosemide  (LASIX ) 20 MG tablet/ protonix  40mg / /metformin  1000mg  / / melantonin 1mg  / HYDROcodone -acetaminophen  (NORCO/VICODIN) 5-325 MG tablet Has the patient contacted their pharmacy? no   This is the patient's preferred pharmacy:  [facility] [address] [place] [address] Phone: [phone] Fax: [phone]    Is this the correct pharmacy for this prescription? yes If no, delete pharmacy and type the correct one.   Has the prescription been filled recently? no  Is the patient out of the medication? yes  Has the patient been seen for an appointment in the last year OR does the patient have an upcoming appointment? yes  Can we respond through MyChart? no  Agent: Please be advised that Rx refills may take up to 3 business days. We ask that you follow-up with your pharmacy.

## 2024-07-21 NOTE — Telephone Encounter (Signed)
" °  FYI Only or Action Required?: FYI only for provider: ED advised and needs diabetes education.  Patient was last seen in primary care on 07/07/2024 by Gari Lauraine BRAVO, FNP.  Called Nurse Triage reporting Blood Sugar Problem.  Symptoms began unknown-hasn't checked sugar in one week, hands shaking for one week.  Interventions attempted: Nothing.  Symptoms are: unchanged.  Triage Disposition: Go to ED Now (or PCP Triage)  Patient/caregiver understands and will follow disposition?: Yes  Reason for Disposition  Blood glucose > 500 mg/dL (72.1 mmol/L)  Answer Assessment - Initial Assessment Questions Bianca Yang-calling to report hyperglycemia.   Blood sugar 508, not using insulin  for one week due to not being able to use glucometer. Her hands are shaky and unable to manipulate machine.   ED advised. Family member joined the call and has questions on blood glucose levels and when to administer insulin . They would like sliding scale and chart to base dosing.   Patient and family agreeable to ER for current blood sugar of 508 and symptomatic. Patient will call back after ED evaluation to schedule follow up.   No other needs or questions from family or physical therapist today.  Protocols used: Diabetes - High Blood Sugar-A-AH  Message from [place] H sent at 07/21/2024  3:26 PM EST  Reason for Triage: Bianca - Yang [facility] is calling on behalf of the patient. Current blood sugar of 508 (patient was eating when [facility] arrived) - has not been taking her insulin  for a week now because she didn't know how to use the pen. Caregiver is there to help but his hand is kind of shaky. HH did show the patient how to use pen so she is aware moving forward. Need to verify if insulin  is on sliding scale. "

## 2024-07-21 NOTE — ED Provider Triage Note (Signed)
 Emergency Medicine Provider Triage Evaluation Note  Bianca Yang , a 49 y.o. female  was evaluated in triage.  Pt complains of hyperglycemia.  Found to have glucose of 508.  Patient feels tired otherwise without significant complaints.  Reports noncompliance with insulin .  Does have a history of osteomyelitis and toe amputation.  Review of Systems  Positive:  Negative:   Physical Exam  BP 103/65 (BP Location: Right Arm)   Pulse 87   Resp 14   Ht 5' 7 (1.702 m)   Wt 75.3 kg   LMP  (LMP Unknown)   SpO2 99%   BMI 26.00 kg/m  Gen:   Awake, no distress   Resp:  Normal effort  MSK:   Moves extremities without difficulty  Other:    Medical Decision Making  Medically screening exam initiated at 4:47 PM.  Appropriate orders placed.  Bianca Yang was informed that the remainder of the evaluation will be completed by another provider, this initial triage assessment does not replace that evaluation, and the importance of remaining in the ED until their evaluation is complete.     Bianca Lynwood DEL, PA-C 07/21/24 1649

## 2024-07-21 NOTE — ED Triage Notes (Signed)
 Pt sent by PCP b/c therapist was at house and checked glucose, found it to be 508. Pt is A&O.

## 2024-07-21 NOTE — ED Provider Notes (Signed)
 " [place] [facility] Provider Note   CSN: [id number] Arrival date & time: 07/21/24  1614     Patient presents with: No chief complaint on file.   Bianca Yang is a 49 y.o. female.   Pt is a 49 yo female with pmhx significant for DM2, GERD, COPD (on 4L oxygen chronically), chronic osteomyelitis left foot.  Pt said she has not been taking her insulin  as directed because she did not know how to use her glucometer.  She said her physical therapist who came out to her house showed her how to use it so she knows now.  Pt said her BS was 508 at home.  She has been seen by Dr. Harden and is followed for chronic osteomyelitis to the left foot.  She is on oral abx for it.  Pt denies f/c.  No sob. Pt also c/o migraine.       Prior to Admission medications  Medication Sig Start Date End Date Taking? Authorizing Provider  Accu-Chek Softclix Lancets lancets Dispense based on patient and insurance preference. Use up to four times daily as directed. (FOR ICD-10 E10.9, E11.9). 06/23/24   Cosette Blackwater, MD  acetaminophen  (TYLENOL ) 500 MG tablet Take 2 tablets (1,000 mg total) by mouth every 6 (six) hours. 05/09/24   Samtani, Jai-Gurmukh, MD  albuterol  (VENTOLIN  HFA) 108 (90 Base) MCG/ACT inhaler Inhale 2 puffs into the lungs every 6 (six) hours as needed for wheezing or shortness of breath. 07/06/24 08/05/24  Adrien Winfred Berke, MD  Blood Glucose Monitoring Suppl (BLOOD GLUCOSE MONITOR SYSTEM) w/Device KIT Dispense based on patient and insurance preference. Use up to four times daily as directed. (FOR ICD-10 E10.9, E11.9). 06/23/24   Cosette Blackwater, MD  clotrimazole  (LOTRIMIN ) 1 % cream Apply to affected area 2 times daily 07/05/24   Lenor Hollering, MD  cyanocobalamin  (VITAMIN B12) 1000 MCG tablet Take 0.5 tablets (500 mcg total) by mouth daily. 06/24/24 08/22/24  Cosette Blackwater, MD  furosemide  (LASIX ) 20 MG tablet Take 1 tablet (20 mg total) by mouth 2 (two) times  daily. 06/23/24 07/23/24  Cosette Blackwater, MD  Glucagon  (GVOKE HYPOPEN  2-PACK) 1 MG/0.2ML SOAJ Inject 1 mg into the skin as needed for up to 2 doses (Severe low blood sugar). 06/23/24   Cosette Blackwater, MD  Glucose Blood (BLOOD GLUCOSE TEST STRIPS) STRP Dispense based on patient and insurance preference. Use up to four times daily as directed. (FOR ICD-10 E10.9, E11.9). 06/23/24   Cosette Blackwater, MD  HYDROcodone -acetaminophen  (NORCO/VICODIN) 5-325 MG tablet Take 1 tablet by mouth every 6 (six) hours as needed. 07/10/24   Harden Jerona GAILS, MD  Insulin  Glargine (BASAGLAR  [facility]) 100 UNIT/ML Inject 25 Units into the skin daily. May substitute as needed per insurance. 06/23/24   Cosette Blackwater, MD  Insulin  Pen Needle 32G X 4 MM MISC Dispense based on patient and insurance preference. Use up to four times daily as directed. (FOR ICD-10 E10.9, E11.9). 06/23/24   Cosette Blackwater, MD  Lancet Device MISC Dispense based on patient and insurance preference. Use up to four times daily as directed. (FOR ICD-10 E10.9, E11.9). 06/23/24   Cosette Blackwater, MD  magnesium  oxide (MAG-OX) 400 (240 Mg) MG tablet Take 1 tablet (400 mg total) by mouth 2 (two) times daily. 06/23/24   Cosette Blackwater, MD  melatonin 1 MG TABS tablet Take 1 tablet (1 mg total) by mouth at bedtime. 07/07/24   Gari Lauraine BRAVO, FNP  metFORMIN  (GLUCOPHAGE ) 1000 MG tablet  Take 1 tablet (1,000 mg total) by mouth 2 (two) times daily with a meal. 06/23/24 07/23/24  Cosette Blackwater, MD  mometasone -formoterol  (DULERA ) 100-5 MCG/ACT AERO Inhale 2 puffs into the lungs 2 (two) times daily. 06/23/24   Tariq, Hassan, MD  Multiple Vitamin (MULTIVITAMIN WITH MINERALS) TABS tablet Take 1 tablet by mouth daily. 06/24/24 07/24/24  Cosette Blackwater, MD  naproxen  (NAPROSYN ) 500 MG tablet Take 1 tablet (500 mg total) by mouth 2 (two) times daily with a meal. 05/09/24   Samtani, Jai-Gurmukh, MD  pantoprazole  (PROTONIX ) 40 MG tablet Take 1 tablet (40 mg total) by mouth daily. 06/24/24 07/24/24   Tariq, Hassan, MD  polyethylene glycol powder (GLYCOLAX /MIRALAX ) 17 GM/SCOOP powder Dissolve 1 capful (17g) in 4-8 ounces of liquid and take by mouth 2 (two) times daily as needed. 07/07/24   Gari Lauraine BRAVO, FNP  umeclidinium bromide  (INCRUSE ELLIPTA ) 62.5 MCG/ACT AEPB Inhale 1 puff into the lungs daily. 06/23/24   Cosette Blackwater, MD    Allergies: Sulfa antibiotics    Review of Systems  Neurological:  Positive for headaches.  All other systems reviewed and are negative.   Updated Vital Signs BP (!) 104/49   Pulse 93   Temp 98 F (36.7 C)   Resp 16   Ht 5' 7 (1.702 m)   Wt 75.3 kg   LMP  (LMP Unknown)   SpO2 97%   BMI 26.00 kg/m   Physical Exam Vitals and nursing note reviewed.  Constitutional:      Appearance: Normal appearance.  HENT:     Head: Normocephalic and atraumatic.     Right Ear: External ear normal.     Left Ear: External ear normal.     Nose: Nose normal.     Mouth/Throat:     Mouth: Mucous membranes are moist.     Pharynx: Oropharynx is clear.  Eyes:     Extraocular Movements: Extraocular movements intact.     Conjunctiva/sclera: Conjunctivae normal.     Pupils: Pupils are equal, round, and reactive to light.  Cardiovascular:     Rate and Rhythm: Normal rate and regular rhythm.     Pulses: Normal pulses.     Heart sounds: Normal heart sounds.  Pulmonary:     Effort: Pulmonary effort is normal.     Breath sounds: Normal breath sounds.  Abdominal:     General: Abdomen is flat. Bowel sounds are normal.     Palpations: Abdomen is soft.  Musculoskeletal:     Cervical back: Normal range of motion and neck supple.  Skin:    Capillary Refill: Capillary refill takes less than 2 seconds.     Comments: Chronic eschar left foot  Neurological:     General: No focal deficit present.     Mental Status: She is alert and oriented to person, place, and time.  Psychiatric:        Mood and Affect: Mood normal.        Behavior: Behavior normal.     (all labs  ordered are listed, but only abnormal results are displayed) Labs Reviewed  CBC WITH DIFFERENTIAL/PLATELET - Abnormal; Notable for the following components:      Result Value   Hemoglobin 11.5 (*)    MCHC 29.6 (*)    RDW 16.2 (*)    Eosinophils Absolute 1.4 (*)    Abs Immature Granulocytes 0.10 (*)    All other components within normal limits  COMPREHENSIVE METABOLIC PANEL WITH GFR - Abnormal; Notable for the following  components:   Chloride 92 (*)    CO2 37 (*)    Glucose, Bld 258 (*)    All other components within normal limits  URINALYSIS, ROUTINE W REFLEX MICROSCOPIC - Abnormal; Notable for the following components:   APPearance CLOUDY (*)    Glucose, UA >=500 (*)    Protein, ur 30 (*)    Leukocytes,Ua TRACE (*)    Bacteria, UA RARE (*)    All other components within normal limits  CBG MONITORING, ED - Abnormal; Notable for the following components:   Glucose-Capillary 188 (*)    All other components within normal limits  I-STAT VENOUS BLOOD GAS, ED - Abnormal; Notable for the following components:   pO2, Ven 26 (*)    Bicarbonate 39.2 (*)    TCO2 41 (*)    Acid-Base Excess 12.0 (*)    Calcium , Ion 1.09 (*)    All other components within normal limits  BETA-HYDROXYBUTYRIC ACID  BLOOD GAS, VENOUS    EKG: EKG Interpretation Date/Time:  Friday July 21 2024 19:05:33 EST Ventricular Rate:  83 PR Interval:  132 QRS Duration:  93 QT Interval:  421 QTC Calculation: 495 R Axis:   31  Text Interpretation: Sinus rhythm Borderline prolonged QT interval No significant change since last tracing Confirmed by Dean Clarity [phone]) on 07/21/2024 11:24:59 PM  Radiology: [facility] Foot Complete Left Result Date: 07/21/2024 EXAM: 3 OR MORE VIEW(S) XRAY OF THE LEFT FOOT 07/21/2024 07:25:00 PM COMPARISON: 06/19/2024 CLINICAL HISTORY: Osteomyelitis. FINDINGS: BONES AND JOINTS: Postsurgical changes of fifth ray amputation. Osseous erosion of the lateral cuboid consistent with  osteomyelitis. Mild degenerative changes of the first metatarsophalangeal joint. No acute fracture. No malalignment. SOFT TISSUES: Soft tissue swelling of the lateral midfoot with overlying skin irregularity consistent with known osteomyelitis. Vascular calcifications. IMPRESSION: 1. Osseous erosion of the lateral cuboid and soft tissue swelling of the lateral midfoot with overlying skin irregularity, consistent with osteomyelitis. 2. Postsurgical changes of fifth ray amputation. Electronically signed by: Oneil Devonshire MD 07/21/2024 07:29 PM EST RP Workstation: [id number]     Procedures   Medications Ordered in the ED  bacitracin ointment (has no administration in time range)  ketorolac  (TORADOL ) 30 MG/ML injection 30 mg (has no administration in time range)  metoCLOPramide  (REGLAN ) injection 10 mg (has no administration in time range)  diphenhydrAMINE  (BENADRYL ) injection 12.5 mg (has no administration in time range)  sodium chloride  0.9 % bolus 1,000 mL (0 mLs Intravenous Stopped 07/21/24 2150)  morphine  (PF) 4 MG/ML injection 4 mg (4 mg Intravenous Given 07/21/24 1931)  ondansetron  (ZOFRAN ) injection 4 mg (4 mg Intravenous Given 07/21/24 1930)                                    Medical Decision Making Amount and/or Complexity of Data Reviewed Radiology: ordered.  Risk Prescription drug management.   This patient presents to the ED for concern of hyperglycemia, this involves an extensive number of treatment options, and is a complaint that carries with it a high risk of complications and morbidity.  The differential diagnosis includes med noncompliance, infection   Co morbidities that complicate the patient evaluation  DM2, GERD, COPD (on 4L oxygen chronically), chronic osteomyelitis left foot   Additional history obtained:  Additional history obtained from epic chart review External records from outside source obtained and reviewed including EMS report   Lab Tests:  I  Ordered, and personally  interpreted labs.  The pertinent results include:  cbc nl other than hgb low at 11.5 (stable); cmp nl other than glucose low at 258 and now down to 188; ua neg for uti; BHA neg   Imaging Studies ordered:  I ordered imaging studies including left foot  I independently visualized and interpreted imaging which showed  Osseous erosion of the lateral cuboid and soft tissue swelling of the  lateral midfoot with overlying skin irregularity, consistent with  osteomyelitis.  2. Postsurgical changes of fifth ray amputation.   I agree with the radiologist interpretation   Cardiac Monitoring:  The patient was maintained on a cardiac monitor.  I personally viewed and interpreted the cardiac monitored which showed an underlying rhythm of: neg   Medicines ordered and prescription drug management:  I ordered medication including ivfs  for sx  Reevaluation of the patient after these medicines showed that the patient improved I have reviewed the patients home medicines and have made adjustments as needed   Test Considered:  ct  Problem List / ED Course:  Hyperglycemia:  BS down to 188 with IVFs Osteomyelitis:  chronic.  Pt on oral abx Migraine:  improved with meds   Reevaluation:  After the interventions noted above, I reevaluated the patient and found that they have :improved   Social Determinants of Health:  Lives at home   Dispostion:  After consideration of the diagnostic results and the patients response to treatment, I feel that the patent would benefit from discharge with outpatient f/u.       Final diagnoses:  Hyperglycemia due to diabetes mellitus (HCC)  Chronic osteomyelitis of left foot (HCC)  Other migraine without status migrainosus, not intractable    ED Discharge Orders     None          Dean Clarity, MD 07/21/24 2330  "

## 2024-07-22 NOTE — ED Notes (Signed)
 Pt's father contacted to verify he is home & able to let pt inside.

## 2024-07-22 NOTE — ED Notes (Signed)
 Pts left foot wrapped and bacitracin  applied

## 2024-07-24 ENCOUNTER — Other Ambulatory Visit: Payer: Self-pay

## 2024-07-24 ENCOUNTER — Other Ambulatory Visit (HOSPITAL_COMMUNITY): Payer: Self-pay

## 2024-07-24 MED ORDER — FUROSEMIDE 20 MG PO TABS
20.0000 mg | ORAL_TABLET | Freq: Two times a day (BID) | ORAL | 3 refills | Status: AC
  Filled 2024-07-24: qty 60, 30d supply, fill #0

## 2024-07-24 MED ORDER — FUROSEMIDE 20 MG PO TABS
20.0000 mg | ORAL_TABLET | Freq: Two times a day (BID) | ORAL | 3 refills | Status: DC
  Filled 2024-07-24: qty 60, 30d supply, fill #0

## 2024-07-24 MED ORDER — VITAMIN B-12 1000 MCG PO TABS
500.0000 ug | ORAL_TABLET | Freq: Every day | ORAL | 3 refills | Status: DC
  Filled 2024-07-24: qty 30, 60d supply, fill #0

## 2024-07-24 MED ORDER — VITAMIN B-12 1000 MCG PO TABS
500.0000 ug | ORAL_TABLET | Freq: Every day | ORAL | 3 refills | Status: AC
  Filled 2024-07-24 – 2024-08-03 (×2): qty 30, 60d supply, fill #0

## 2024-07-24 MED ORDER — METFORMIN HCL 1000 MG PO TABS
1000.0000 mg | ORAL_TABLET | Freq: Two times a day (BID) | ORAL | 3 refills | Status: AC
  Filled 2024-07-24: qty 60, 30d supply, fill #0

## 2024-07-24 MED ORDER — PANTOPRAZOLE SODIUM 40 MG PO TBEC
40.0000 mg | DELAYED_RELEASE_TABLET | Freq: Every day | ORAL | 3 refills | Status: DC
  Filled 2024-07-24: qty 3, 3d supply, fill #0

## 2024-07-24 MED ORDER — MAGNESIUM OXIDE -MG SUPPLEMENT 400 (240 MG) MG PO TABS
400.0000 mg | ORAL_TABLET | Freq: Two times a day (BID) | ORAL | 3 refills | Status: DC
  Filled 2024-07-24: qty 60, 30d supply, fill #0

## 2024-07-24 MED ORDER — PANTOPRAZOLE SODIUM 40 MG PO TBEC
40.0000 mg | DELAYED_RELEASE_TABLET | Freq: Every day | ORAL | 3 refills | Status: AC
  Filled 2024-07-24: qty 3, 3d supply, fill #0
  Filled 2024-07-25: qty 30, 30d supply, fill #0

## 2024-07-24 MED ORDER — METFORMIN HCL 1000 MG PO TABS
1000.0000 mg | ORAL_TABLET | Freq: Two times a day (BID) | ORAL | 3 refills | Status: DC
  Filled 2024-07-24: qty 60, 30d supply, fill #0

## 2024-07-24 MED ORDER — MAGNESIUM OXIDE -MG SUPPLEMENT 400 (240 MG) MG PO TABS
400.0000 mg | ORAL_TABLET | Freq: Two times a day (BID) | ORAL | 3 refills | Status: AC
  Filled 2024-07-24: qty 60, 30d supply, fill #0

## 2024-07-24 NOTE — Telephone Encounter (Signed)
 Please advise if you are okay with taking over refills of all medications requested.

## 2024-07-24 NOTE — Telephone Encounter (Signed)
 Routing to Sarah for verification.

## 2024-07-24 NOTE — Addendum Note (Signed)
 Addended by: RONNETTE DAMIEN SQUIBB on: 07/24/2024 03:20 PM   Modules accepted: Orders

## 2024-07-24 NOTE — Telephone Encounter (Signed)
 Message sent to pt asking if she wants referral to pain management. Other meds have been sent to pharmacy for pt. Will await response from pt about the referral.

## 2024-07-24 NOTE — Telephone Encounter (Signed)
 Do we know if this has been fixed or if not, which place might accept pt's insurance? Miranda, is there any way you can take a look at this for us ?

## 2024-07-25 ENCOUNTER — Other Ambulatory Visit (HOSPITAL_COMMUNITY): Payer: Self-pay

## 2024-07-25 ENCOUNTER — Other Ambulatory Visit: Payer: Self-pay

## 2024-07-27 ENCOUNTER — Other Ambulatory Visit (HOSPITAL_BASED_OUTPATIENT_CLINIC_OR_DEPARTMENT_OTHER): Payer: Self-pay

## 2024-07-27 DIAGNOSIS — M869 Osteomyelitis, unspecified: Secondary | ICD-10-CM

## 2024-07-27 NOTE — Telephone Encounter (Signed)
Pt would like referral to pain management. 

## 2024-07-27 NOTE — Progress Notes (Signed)
 Referral placed for pain management per patient request.

## 2024-07-28 ENCOUNTER — Telehealth: Payer: Self-pay | Admitting: Orthopedic Surgery

## 2024-07-28 ENCOUNTER — Other Ambulatory Visit (HOSPITAL_COMMUNITY): Payer: Self-pay

## 2024-07-28 NOTE — Telephone Encounter (Signed)
 Pt has an apt for follow up 08/14/2024

## 2024-07-28 NOTE — Telephone Encounter (Signed)
 Jerel from [facility] called saying that the pt has been MIA all week and hasn't responded to any calls or text messages all week, therefore she hasn't had any therapy this week. Jerel has reached out to an alternate caregiver who said he would relay message to pt. Call back number is [phone].

## 2024-07-29 ENCOUNTER — Telehealth (HOSPITAL_BASED_OUTPATIENT_CLINIC_OR_DEPARTMENT_OTHER): Payer: Self-pay

## 2024-07-29 NOTE — Telephone Encounter (Signed)
 Copied from CRM #[id number]. Topic: Clinical - Home Health Verbal Orders >> Jul 28, 2024 11:40 AM Gattis SQUIBB wrote: Caller/Agency: Patient   Callback Number: [phone]  Service Requested: Pt wants a home health eval,  She thought that was going to be getting someone after her visit to [facility] on 1/9  Frequency: Eval  Any new concerns about the patient? No

## 2024-07-29 NOTE — Telephone Encounter (Signed)
 Copied from CRM #[id number]. Topic: Clinical - Prescription Issue >> Jul 28, 2024 11:44 AM Gattis SQUIBB wrote: Reason for CRM: Pt states she is needing refills on her medication.  I see where they where they were requested on the 26th but still pending.  She would like to use the [facility] if they can deliver.  [phone]

## 2024-07-29 NOTE — Telephone Encounter (Signed)
 Copied from CRM [id number]. Topic: Referral - Question >> Jul 28, 2024 11:48 AM Gattis SQUIBB wrote: Reason for CRM: pt said she thought she was supposed to see a cardiologist after she left the hospital   Patient was supposed to follow up with cardiology. Per referral they have tried to call the patient. Message sent to patient to call them to schedule  [phone].

## 2024-07-31 NOTE — Telephone Encounter (Signed)
 Meds sent to [facility] 1/26. Mychart sent to pt. Will await response.

## 2024-08-01 ENCOUNTER — Ambulatory Visit: Admitting: Cardiology

## 2024-08-01 NOTE — Telephone Encounter (Signed)
 Called patient and informed her that the home health aide referral was placed and if she needed help with her glucose monitor she could schedule a nurse visit. Patient states she will just wait until her upcoming appointment on 08/07/2024. Patient provided verbal understanding of everything. All questions and concerns were answered.

## 2024-08-03 ENCOUNTER — Other Ambulatory Visit (HOSPITAL_COMMUNITY): Payer: Self-pay

## 2024-08-04 ENCOUNTER — Other Ambulatory Visit (HOSPITAL_COMMUNITY): Payer: Self-pay

## 2024-08-07 ENCOUNTER — Encounter (HOSPITAL_BASED_OUTPATIENT_CLINIC_OR_DEPARTMENT_OTHER)

## 2024-08-14 ENCOUNTER — Encounter: Admitting: Orthopedic Surgery

## 2024-08-15 ENCOUNTER — Ambulatory Visit: Admitting: Cardiology

## 2024-09-04 ENCOUNTER — Ambulatory Visit (HOSPITAL_BASED_OUTPATIENT_CLINIC_OR_DEPARTMENT_OTHER): Admitting: Pulmonary Disease
